# Patient Record
Sex: Female | Born: 1940 | Race: White | Hispanic: No | Marital: Married | Smoking: Never smoker
Health system: Southern US, Community
[De-identification: ages and names within clinical notes are randomized; demographics above are authoritative.]

## PROBLEM LIST (undated history)

## (undated) DIAGNOSIS — I4819 Other persistent atrial fibrillation: Secondary | ICD-10-CM

## (undated) DIAGNOSIS — E039 Hypothyroidism, unspecified: Secondary | ICD-10-CM

## (undated) DIAGNOSIS — N289 Disorder of kidney and ureter, unspecified: Secondary | ICD-10-CM

## (undated) DIAGNOSIS — I779 Disorder of arteries and arterioles, unspecified: Secondary | ICD-10-CM

## (undated) DIAGNOSIS — K559 Vascular disorder of intestine, unspecified: Secondary | ICD-10-CM

## (undated) DIAGNOSIS — J9601 Acute respiratory failure with hypoxia: Secondary | ICD-10-CM

## (undated) DIAGNOSIS — A419 Sepsis, unspecified organism: Secondary | ICD-10-CM

## (undated) DIAGNOSIS — J9621 Acute and chronic respiratory failure with hypoxia: Secondary | ICD-10-CM

## (undated) DIAGNOSIS — Z950 Presence of cardiac pacemaker: Secondary | ICD-10-CM

## (undated) DIAGNOSIS — D649 Anemia, unspecified: Secondary | ICD-10-CM

## (undated) DIAGNOSIS — I5022 Chronic systolic (congestive) heart failure: Secondary | ICD-10-CM

## (undated) DIAGNOSIS — E785 Hyperlipidemia, unspecified: Secondary | ICD-10-CM

## (undated) DIAGNOSIS — N183 Chronic kidney disease, stage 3 unspecified: Secondary | ICD-10-CM

## (undated) DIAGNOSIS — R011 Cardiac murmur, unspecified: Secondary | ICD-10-CM

## (undated) DIAGNOSIS — K922 Gastrointestinal hemorrhage, unspecified: Secondary | ICD-10-CM

## (undated) DIAGNOSIS — T8859XA Other complications of anesthesia, initial encounter: Secondary | ICD-10-CM

## (undated) DIAGNOSIS — T4145XA Adverse effect of unspecified anesthetic, initial encounter: Secondary | ICD-10-CM

## (undated) DIAGNOSIS — I482 Chronic atrial fibrillation, unspecified: Secondary | ICD-10-CM

## (undated) DIAGNOSIS — M1990 Unspecified osteoarthritis, unspecified site: Secondary | ICD-10-CM

## (undated) DIAGNOSIS — I10 Essential (primary) hypertension: Secondary | ICD-10-CM

## (undated) DIAGNOSIS — I739 Peripheral vascular disease, unspecified: Secondary | ICD-10-CM

## (undated) DIAGNOSIS — Z9889 Other specified postprocedural states: Secondary | ICD-10-CM

## (undated) DIAGNOSIS — I2510 Atherosclerotic heart disease of native coronary artery without angina pectoris: Secondary | ICD-10-CM

## (undated) DIAGNOSIS — R112 Nausea with vomiting, unspecified: Secondary | ICD-10-CM

## (undated) HISTORY — DX: Gastrointestinal hemorrhage, unspecified: K92.2

## (undated) HISTORY — PX: BACK SURGERY: SHX140

## (undated) HISTORY — DX: Chronic kidney disease, stage 3 unspecified: N18.30

## (undated) HISTORY — PX: TOTAL KNEE ARTHROPLASTY: SHX125

## (undated) HISTORY — PX: CARPAL TUNNEL RELEASE: SHX101

## (undated) HISTORY — DX: Chronic kidney disease, stage 3 (moderate): N18.3

## (undated) HISTORY — DX: Vascular disorder of intestine, unspecified: K55.9

## (undated) HISTORY — PX: LUMBAR LAMINECTOMY: SHX95

## (undated) HISTORY — DX: Essential (primary) hypertension: I10

## (undated) HISTORY — DX: Peripheral vascular disease, unspecified: I73.9

## (undated) HISTORY — DX: Anemia, unspecified: D64.9

## (undated) HISTORY — PX: JOINT REPLACEMENT: SHX530

## (undated) HISTORY — PX: APPENDECTOMY: SHX54

## (undated) HISTORY — DX: Disorder of arteries and arterioles, unspecified: I77.9

## (undated) HISTORY — DX: Hyperlipidemia, unspecified: E78.5

## (undated) HISTORY — PX: ABDOMINAL HYSTERECTOMY: SHX81

---

## 1998-01-16 ENCOUNTER — Encounter: Payer: Self-pay | Admitting: Obstetrics and Gynecology

## 1998-01-16 ENCOUNTER — Ambulatory Visit (HOSPITAL_COMMUNITY): Admission: RE | Admit: 1998-01-16 | Discharge: 1998-01-16 | Payer: Self-pay | Admitting: Obstetrics and Gynecology

## 2002-06-01 ENCOUNTER — Ambulatory Visit (HOSPITAL_COMMUNITY): Admission: RE | Admit: 2002-06-01 | Discharge: 2002-06-01 | Payer: Self-pay | Admitting: Nephrology

## 2002-06-01 ENCOUNTER — Encounter: Payer: Self-pay | Admitting: Nephrology

## 2003-08-15 ENCOUNTER — Ambulatory Visit (HOSPITAL_COMMUNITY): Admission: RE | Admit: 2003-08-15 | Discharge: 2003-08-15 | Payer: Self-pay | Admitting: Gastroenterology

## 2004-03-07 ENCOUNTER — Ambulatory Visit: Payer: Self-pay | Admitting: Internal Medicine

## 2004-04-13 ENCOUNTER — Ambulatory Visit: Payer: Self-pay | Admitting: Internal Medicine

## 2004-04-15 HISTORY — PX: FOOT SURGERY: SHX648

## 2004-04-15 HISTORY — PX: OSTEOTOMY: SHX137

## 2004-04-18 ENCOUNTER — Ambulatory Visit (HOSPITAL_COMMUNITY): Admission: RE | Admit: 2004-04-18 | Discharge: 2004-04-18 | Payer: Self-pay | Admitting: Orthopedic Surgery

## 2004-04-18 ENCOUNTER — Ambulatory Visit (HOSPITAL_BASED_OUTPATIENT_CLINIC_OR_DEPARTMENT_OTHER): Admission: RE | Admit: 2004-04-18 | Discharge: 2004-04-18 | Payer: Self-pay | Admitting: Orthopedic Surgery

## 2004-06-11 ENCOUNTER — Ambulatory Visit: Payer: Self-pay | Admitting: Internal Medicine

## 2005-04-13 ENCOUNTER — Emergency Department (HOSPITAL_COMMUNITY): Admission: EM | Admit: 2005-04-13 | Discharge: 2005-04-13 | Payer: Self-pay | Admitting: Family Medicine

## 2005-05-02 ENCOUNTER — Ambulatory Visit: Payer: Self-pay | Admitting: Internal Medicine

## 2005-06-03 ENCOUNTER — Ambulatory Visit: Payer: Self-pay | Admitting: Internal Medicine

## 2006-04-21 ENCOUNTER — Ambulatory Visit: Payer: Self-pay | Admitting: Internal Medicine

## 2006-04-21 LAB — CONVERTED CEMR LAB
ALT: 12 units/L (ref 0–40)
AST: 19 units/L (ref 0–37)
Albumin: 3.7 g/dL (ref 3.5–5.2)
Alkaline Phosphatase: 75 units/L (ref 39–117)
BUN: 13 mg/dL (ref 6–23)
Basophils Absolute: 0 10*3/uL (ref 0.0–0.1)
Basophils Relative: 0.3 % (ref 0.0–1.0)
CO2: 29 meq/L (ref 19–32)
Calcium: 9.6 mg/dL (ref 8.4–10.5)
Chloride: 101 meq/L (ref 96–112)
Chol/HDL Ratio, serum: 2.8
Cholesterol: 148 mg/dL (ref 0–200)
Creatinine, Ser: 1 mg/dL (ref 0.4–1.2)
Creatinine,U: 149.4 mg/dL
Eosinophil percent: 5.4 % — ABNORMAL HIGH (ref 0.0–5.0)
GFR calc non Af Amer: 59 mL/min
Glomerular Filtration Rate, Af Am: 71 mL/min/{1.73_m2}
Glucose, Bld: 120 mg/dL — ABNORMAL HIGH (ref 70–99)
HCT: 36.3 % (ref 36.0–46.0)
HDL: 53.3 mg/dL (ref >39.0)
Hemoglobin: 12.2 g/dL (ref 12.0–15.0)
Hgb A1c MFr Bld: 7 % — ABNORMAL HIGH (ref 4.6–6.0)
LDL Cholesterol: 73 mg/dL (ref 0–99)
Lymphocytes Relative: 15.1 % (ref 12.0–46.0)
MCHC: 33.6 g/dL (ref 30.0–36.0)
MCV: 101.6 fL — ABNORMAL HIGH (ref 78.0–100.0)
Microalb Creat Ratio: 16.1 mg/g (ref 0.0–30.0)
Microalb, Ur: 2.4 mg/dL — ABNORMAL HIGH (ref 0.0–1.9)
Monocytes Absolute: 0.5 10*3/uL (ref 0.2–0.7)
Monocytes Relative: 10 % (ref 3.0–11.0)
Neutro Abs: 3.7 10*3/uL (ref 1.4–7.7)
Neutrophils Relative %: 69.2 % (ref 43.0–77.0)
Platelets: 324 10*3/uL (ref 150–400)
Potassium: 4.1 meq/L (ref 3.5–5.1)
RBC: 3.57 M/uL — ABNORMAL LOW (ref 3.87–5.11)
RDW: 12.4 % (ref 11.5–14.6)
Sodium: 137 meq/L (ref 135–145)
TSH: 3.91 microintl units/mL (ref 0.35–5.50)
Total Bilirubin: 1.1 mg/dL (ref 0.3–1.2)
Total CK: 72 units/L (ref 7–177)
Total Protein: 7.6 g/dL (ref 6.0–8.3)
Triglyceride fasting, serum: 108 mg/dL (ref 0–149)
VLDL: 22 mg/dL (ref 0–40)
WBC: 5.3 10*3/uL (ref 4.5–10.5)

## 2006-06-30 ENCOUNTER — Ambulatory Visit: Payer: Self-pay | Admitting: Family Medicine

## 2007-02-10 ENCOUNTER — Ambulatory Visit: Payer: Self-pay | Admitting: Internal Medicine

## 2007-02-10 LAB — CONVERTED CEMR LAB: Hgb A1c MFr Bld: 6.8 % — ABNORMAL HIGH (ref 4.6–6.0)

## 2007-02-11 DIAGNOSIS — M8580 Other specified disorders of bone density and structure, unspecified site: Secondary | ICD-10-CM

## 2007-02-16 ENCOUNTER — Ambulatory Visit: Payer: Self-pay | Admitting: Internal Medicine

## 2007-02-25 ENCOUNTER — Telehealth (INDEPENDENT_AMBULATORY_CARE_PROVIDER_SITE_OTHER): Payer: Self-pay | Admitting: *Deleted

## 2007-03-03 ENCOUNTER — Encounter: Payer: Self-pay | Admitting: Internal Medicine

## 2007-05-12 ENCOUNTER — Encounter: Payer: Self-pay | Admitting: Internal Medicine

## 2007-05-25 ENCOUNTER — Telehealth (INDEPENDENT_AMBULATORY_CARE_PROVIDER_SITE_OTHER): Payer: Self-pay | Admitting: *Deleted

## 2007-05-25 ENCOUNTER — Ambulatory Visit: Payer: Self-pay | Admitting: Internal Medicine

## 2007-05-25 DIAGNOSIS — H35349 Macular cyst, hole, or pseudohole, unspecified eye: Secondary | ICD-10-CM

## 2007-05-25 DIAGNOSIS — Z9849 Cataract extraction status, unspecified eye: Secondary | ICD-10-CM

## 2007-05-25 HISTORY — PX: CATARACT EXTRACTION: SUR2

## 2007-05-26 ENCOUNTER — Encounter: Payer: Self-pay | Admitting: Internal Medicine

## 2007-06-03 ENCOUNTER — Ambulatory Visit (HOSPITAL_COMMUNITY): Admission: RE | Admit: 2007-06-03 | Discharge: 2007-06-03 | Payer: Self-pay | Admitting: Ophthalmology

## 2007-06-16 ENCOUNTER — Ambulatory Visit: Payer: Self-pay | Admitting: Internal Medicine

## 2007-06-16 LAB — CONVERTED CEMR LAB
ALT: 14 units/L (ref 0–35)
AST: 19 units/L (ref 0–37)
BUN: 26 mg/dL — ABNORMAL HIGH (ref 6–23)
Cholesterol: 152 mg/dL (ref 0–200)
Creatinine, Ser: 1 mg/dL (ref 0.4–1.2)
HDL: 44.7 mg/dL (ref >39.0)
Hgb A1c MFr Bld: 7.1 % — ABNORMAL HIGH (ref 4.6–6.0)
LDL Cholesterol: 89 mg/dL (ref 0–99)
Potassium: 5.1 meq/L (ref 3.5–5.1)
Total CHOL/HDL Ratio: 3.4
Triglycerides: 90 mg/dL (ref 0–149)
VLDL: 18 mg/dL (ref 0–40)

## 2007-06-23 ENCOUNTER — Ambulatory Visit: Payer: Self-pay | Admitting: Internal Medicine

## 2007-06-23 DIAGNOSIS — E782 Mixed hyperlipidemia: Secondary | ICD-10-CM | POA: Insufficient documentation

## 2007-06-23 LAB — CONVERTED CEMR LAB
Cholesterol, target level: 200 mg/dL
HDL goal, serum: 40 mg/dL
LDL Goal: 100 mg/dL

## 2007-08-24 ENCOUNTER — Ambulatory Visit: Payer: Self-pay | Admitting: Internal Medicine

## 2007-08-29 LAB — CONVERTED CEMR LAB
BUN: 24 mg/dL — ABNORMAL HIGH (ref 6–23)
Creatinine, Ser: 0.9 mg/dL (ref 0.4–1.2)

## 2007-08-31 ENCOUNTER — Ambulatory Visit: Payer: Self-pay | Admitting: Internal Medicine

## 2007-08-31 ENCOUNTER — Encounter (INDEPENDENT_AMBULATORY_CARE_PROVIDER_SITE_OTHER): Payer: Self-pay | Admitting: *Deleted

## 2007-12-03 ENCOUNTER — Ambulatory Visit: Payer: Self-pay | Admitting: Internal Medicine

## 2007-12-07 LAB — CONVERTED CEMR LAB
Microalb Creat Ratio: 10.3 mg/g (ref 0.0–30.0)
Microalb, Ur: 0.6 mg/dL (ref 0.0–1.9)

## 2007-12-08 ENCOUNTER — Ambulatory Visit: Payer: Self-pay | Admitting: Internal Medicine

## 2007-12-09 ENCOUNTER — Telehealth (INDEPENDENT_AMBULATORY_CARE_PROVIDER_SITE_OTHER): Payer: Self-pay | Admitting: *Deleted

## 2007-12-15 HISTORY — PX: GANGLION CYST EXCISION: SHX1691

## 2007-12-17 ENCOUNTER — Telehealth (INDEPENDENT_AMBULATORY_CARE_PROVIDER_SITE_OTHER): Payer: Self-pay | Admitting: *Deleted

## 2007-12-18 ENCOUNTER — Telehealth (INDEPENDENT_AMBULATORY_CARE_PROVIDER_SITE_OTHER): Payer: Self-pay | Admitting: *Deleted

## 2007-12-23 ENCOUNTER — Encounter: Payer: Self-pay | Admitting: Internal Medicine

## 2008-01-01 ENCOUNTER — Telehealth (INDEPENDENT_AMBULATORY_CARE_PROVIDER_SITE_OTHER): Payer: Self-pay | Admitting: *Deleted

## 2008-02-11 ENCOUNTER — Encounter: Payer: Self-pay | Admitting: Internal Medicine

## 2008-03-14 ENCOUNTER — Ambulatory Visit: Payer: Self-pay | Admitting: Internal Medicine

## 2008-03-20 LAB — CONVERTED CEMR LAB
AST: 15 units/L (ref 0–37)
Alkaline Phosphatase: 68 units/L (ref 39–117)
Hgb A1c MFr Bld: 6.6 % — ABNORMAL HIGH (ref 4.6–6.0)
Total Bilirubin: 0.8 mg/dL (ref 0.3–1.2)
Total CHOL/HDL Ratio: 2.7

## 2008-03-21 ENCOUNTER — Encounter (INDEPENDENT_AMBULATORY_CARE_PROVIDER_SITE_OTHER): Payer: Self-pay | Admitting: *Deleted

## 2008-03-22 ENCOUNTER — Ambulatory Visit: Payer: Self-pay | Admitting: Internal Medicine

## 2008-03-24 ENCOUNTER — Telehealth (INDEPENDENT_AMBULATORY_CARE_PROVIDER_SITE_OTHER): Payer: Self-pay | Admitting: *Deleted

## 2008-03-28 ENCOUNTER — Encounter: Payer: Self-pay | Admitting: Internal Medicine

## 2008-04-04 ENCOUNTER — Telehealth (INDEPENDENT_AMBULATORY_CARE_PROVIDER_SITE_OTHER): Payer: Self-pay | Admitting: *Deleted

## 2008-04-15 HISTORY — PX: PARS PLANA VITRECTOMY W/ REPAIR OF MACULAR HOLE: SHX2170

## 2008-04-20 ENCOUNTER — Telehealth (INDEPENDENT_AMBULATORY_CARE_PROVIDER_SITE_OTHER): Payer: Self-pay | Admitting: *Deleted

## 2008-04-20 ENCOUNTER — Encounter: Payer: Self-pay | Admitting: Internal Medicine

## 2008-04-29 ENCOUNTER — Telehealth (INDEPENDENT_AMBULATORY_CARE_PROVIDER_SITE_OTHER): Payer: Self-pay | Admitting: *Deleted

## 2008-05-02 ENCOUNTER — Inpatient Hospital Stay (HOSPITAL_COMMUNITY): Admission: RE | Admit: 2008-05-02 | Discharge: 2008-05-05 | Payer: Self-pay | Admitting: Orthopedic Surgery

## 2008-05-04 ENCOUNTER — Telehealth (INDEPENDENT_AMBULATORY_CARE_PROVIDER_SITE_OTHER): Payer: Self-pay | Admitting: *Deleted

## 2008-06-14 ENCOUNTER — Encounter: Payer: Self-pay | Admitting: Internal Medicine

## 2008-06-23 ENCOUNTER — Telehealth (INDEPENDENT_AMBULATORY_CARE_PROVIDER_SITE_OTHER): Payer: Self-pay | Admitting: *Deleted

## 2008-07-01 ENCOUNTER — Encounter: Payer: Self-pay | Admitting: Internal Medicine

## 2008-07-06 ENCOUNTER — Encounter (INDEPENDENT_AMBULATORY_CARE_PROVIDER_SITE_OTHER): Payer: Self-pay | Admitting: *Deleted

## 2008-07-12 ENCOUNTER — Ambulatory Visit: Payer: Self-pay | Admitting: Internal Medicine

## 2008-07-14 ENCOUNTER — Encounter: Payer: Self-pay | Admitting: Internal Medicine

## 2008-07-25 ENCOUNTER — Inpatient Hospital Stay (HOSPITAL_COMMUNITY): Admission: RE | Admit: 2008-07-25 | Discharge: 2008-07-28 | Payer: Self-pay | Admitting: Orthopedic Surgery

## 2008-08-08 ENCOUNTER — Ambulatory Visit: Payer: Self-pay | Admitting: Internal Medicine

## 2008-08-10 LAB — CONVERTED CEMR LAB
Creatinine,U: 198.5 mg/dL
Microalb Creat Ratio: 3 mg/g (ref 0.0–30.0)

## 2008-08-11 ENCOUNTER — Encounter (INDEPENDENT_AMBULATORY_CARE_PROVIDER_SITE_OTHER): Payer: Self-pay | Admitting: *Deleted

## 2008-08-15 ENCOUNTER — Ambulatory Visit: Payer: Self-pay | Admitting: Internal Medicine

## 2008-09-19 ENCOUNTER — Telehealth (INDEPENDENT_AMBULATORY_CARE_PROVIDER_SITE_OTHER): Payer: Self-pay | Admitting: *Deleted

## 2008-10-14 ENCOUNTER — Encounter: Payer: Self-pay | Admitting: Internal Medicine

## 2008-10-18 LAB — HM PAP SMEAR: HM Pap smear: NORMAL

## 2008-10-20 ENCOUNTER — Ambulatory Visit: Payer: Self-pay | Admitting: Surgery

## 2008-10-31 ENCOUNTER — Encounter: Payer: Self-pay | Admitting: Internal Medicine

## 2008-12-20 ENCOUNTER — Ambulatory Visit: Payer: Self-pay | Admitting: Internal Medicine

## 2008-12-25 LAB — CONVERTED CEMR LAB
AST: 17 units/L (ref 0–37)
Albumin: 3.9 g/dL (ref 3.5–5.2)
Cholesterol: 116 mg/dL (ref 0–200)
Creatinine, Ser: 0.9 mg/dL (ref 0.4–1.2)
Microalb Creat Ratio: 30.8 mg/g — ABNORMAL HIGH (ref 0.0–30.0)
Microalb, Ur: 1.4 mg/dL (ref 0.0–1.9)
Potassium: 5.5 meq/L — ABNORMAL HIGH (ref 3.5–5.1)
VLDL: 14 mg/dL (ref 0.0–40.0)

## 2008-12-26 ENCOUNTER — Ambulatory Visit: Payer: Self-pay | Admitting: Internal Medicine

## 2009-01-03 ENCOUNTER — Encounter: Payer: Self-pay | Admitting: Internal Medicine

## 2009-01-31 ENCOUNTER — Encounter: Payer: Self-pay | Admitting: Internal Medicine

## 2009-01-31 HISTORY — PX: COLONOSCOPY: SHX174

## 2009-02-09 ENCOUNTER — Encounter: Payer: Self-pay | Admitting: Internal Medicine

## 2009-02-15 ENCOUNTER — Encounter: Payer: Self-pay | Admitting: Internal Medicine

## 2009-02-20 ENCOUNTER — Telehealth (INDEPENDENT_AMBULATORY_CARE_PROVIDER_SITE_OTHER): Payer: Self-pay | Admitting: *Deleted

## 2009-03-01 ENCOUNTER — Encounter: Payer: Self-pay | Admitting: Internal Medicine

## 2009-03-14 ENCOUNTER — Telehealth (INDEPENDENT_AMBULATORY_CARE_PROVIDER_SITE_OTHER): Payer: Self-pay | Admitting: *Deleted

## 2009-03-15 ENCOUNTER — Encounter: Payer: Self-pay | Admitting: Internal Medicine

## 2009-05-09 ENCOUNTER — Telehealth (INDEPENDENT_AMBULATORY_CARE_PROVIDER_SITE_OTHER): Payer: Self-pay | Admitting: *Deleted

## 2009-06-21 ENCOUNTER — Encounter: Payer: Self-pay | Admitting: Internal Medicine

## 2009-06-26 ENCOUNTER — Ambulatory Visit: Payer: Self-pay | Admitting: Internal Medicine

## 2009-07-03 ENCOUNTER — Ambulatory Visit: Payer: Self-pay | Admitting: Internal Medicine

## 2009-07-03 LAB — CONVERTED CEMR LAB
BUN: 19 mg/dL (ref 6–23)
Creatinine, Ser: 1 mg/dL (ref 0.4–1.2)
Creatinine,U: 87.5 mg/dL
Hgb A1c MFr Bld: 7.2 % — ABNORMAL HIGH (ref 4.6–6.5)
Microalb Creat Ratio: 78.9 mg/g — ABNORMAL HIGH (ref 0.0–30.0)
Microalb, Ur: 6.9 mg/dL — ABNORMAL HIGH (ref 0.0–1.9)
Total CHOL/HDL Ratio: 3
Triglycerides: 93 mg/dL (ref 0.0–149.0)

## 2009-07-05 ENCOUNTER — Encounter: Payer: Self-pay | Admitting: Internal Medicine

## 2009-08-07 ENCOUNTER — Telehealth (INDEPENDENT_AMBULATORY_CARE_PROVIDER_SITE_OTHER): Payer: Self-pay | Admitting: *Deleted

## 2009-09-01 ENCOUNTER — Telehealth (INDEPENDENT_AMBULATORY_CARE_PROVIDER_SITE_OTHER): Payer: Self-pay | Admitting: *Deleted

## 2009-09-01 ENCOUNTER — Encounter: Payer: Self-pay | Admitting: Internal Medicine

## 2009-09-18 ENCOUNTER — Ambulatory Visit: Payer: Self-pay | Admitting: Internal Medicine

## 2009-09-28 ENCOUNTER — Encounter: Payer: Self-pay | Admitting: Internal Medicine

## 2009-10-02 ENCOUNTER — Ambulatory Visit: Payer: Self-pay | Admitting: Internal Medicine

## 2009-10-18 ENCOUNTER — Telehealth (INDEPENDENT_AMBULATORY_CARE_PROVIDER_SITE_OTHER): Payer: Self-pay | Admitting: *Deleted

## 2009-11-01 ENCOUNTER — Encounter: Payer: Self-pay | Admitting: Internal Medicine

## 2009-11-20 ENCOUNTER — Encounter: Payer: Self-pay | Admitting: Internal Medicine

## 2010-01-01 ENCOUNTER — Ambulatory Visit: Payer: Self-pay | Admitting: Cardiovascular Disease

## 2010-01-01 ENCOUNTER — Encounter: Payer: Self-pay | Admitting: Internal Medicine

## 2010-01-19 ENCOUNTER — Encounter: Payer: Self-pay | Admitting: Internal Medicine

## 2010-01-29 ENCOUNTER — Encounter: Payer: Self-pay | Admitting: Internal Medicine

## 2010-01-29 ENCOUNTER — Ambulatory Visit: Payer: Self-pay | Admitting: Cardiology

## 2010-02-01 ENCOUNTER — Ambulatory Visit: Payer: Self-pay | Admitting: Internal Medicine

## 2010-02-05 LAB — CONVERTED CEMR LAB
Creatinine,U: 47 mg/dL
Microalb Creat Ratio: 40.2 mg/g — ABNORMAL HIGH (ref 0.0–30.0)
Microalb, Ur: 18.9 mg/dL — ABNORMAL HIGH (ref 0.0–1.9)
Vit D, 25-Hydroxy: 64 ng/mL (ref 30–89)

## 2010-02-08 ENCOUNTER — Ambulatory Visit: Payer: Self-pay | Admitting: Internal Medicine

## 2010-03-01 ENCOUNTER — Encounter: Payer: Self-pay | Admitting: Internal Medicine

## 2010-04-17 ENCOUNTER — Telehealth (INDEPENDENT_AMBULATORY_CARE_PROVIDER_SITE_OTHER): Payer: Self-pay | Admitting: *Deleted

## 2010-05-09 ENCOUNTER — Telehealth: Payer: Self-pay | Admitting: Internal Medicine

## 2010-05-11 ENCOUNTER — Encounter: Payer: Self-pay | Admitting: Internal Medicine

## 2010-05-15 ENCOUNTER — Encounter: Payer: Self-pay | Admitting: Internal Medicine

## 2010-05-15 NOTE — Assessment & Plan Note (Signed)
Summary: follow-up   Vital Signs:  Patient profile:   70 year old female Weight:      178.8 pounds Pulse rate:   60 / minute Resp:     17 per minute BP sitting:   126 / 80  (left arm) Cuff size:   large  Vitals Entered By: Shonna Chock CMA (February 08, 2010 8:47 AM) CC: 4 month follow-up, copy of labs given. Patient would also like to know if she should continue Bystolic, Type 2 diabetes mellitus follow-up   CC:  4 month follow-up, copy of labs given. Patient would also like to know if she should continue Bystolic, and Type 2 diabetes mellitus follow-up.  History of Present Illness: Type 2 Diabetes Mellitus Follow-Up      This is a 70 year old woman who presents for Type 2 diabetes mellitus follow-up.  The patient reports weight loss of "few #", but denies polyuria, polydipsia, blurred vision, self managed hypoglycemia, and numbness of extremities.  The patient denies the following symptoms: neuropathic pain, chest pain, orthostatic symptoms, poor wound healing, intermittent claudication, vision loss, and foot ulcer.  The patient has been measuring capillary blood glucose before breakfast(94-114) and 2 hrs after dinner(112-158).  Since the last visit, the patient reports having had eye care by an Ophthalmologist; no retinopathy.  A1c 7.2%(average sugar 160, risk 44%). Note: diet poor due to travel this Summer  Allergies: 1)  ! Morphine 2)  ! Vicodin  Physical Exam  General:  well-nourished;alert,appropriate and cooperative throughout examination Lungs:  Normal respiratory effort, chest expands symmetrically. Lungs are clear to auscultation, no crackles or wheezes. Heart:  regular rhythm, no gallop, no rub, no JVD, and bradycardia.  S4 with slurring Pulses:  R and L carotid,radial,dorsalis pedis and posterior tibial pulses are full and equal bilaterally Extremities:  No clubbing, cyanosis, edema. Good nail health Neurologic:  alert & oriented X3 and sensation intact to light  touch over feet.   Skin:  Intact without suspicious lesions or rashes   Impression & Recommendations:  Problem # 1:  DIABETES MELLITUS, UNCONTROLLED (ICD-250.02) A1c 7.2% with microalbuminuria The following medications were removed from the medication list:    Metformin Hcl 500 Mg Tb24 (Metformin hcl) .Marland Kitchen... Take 2 tablets  by mouth two times a day with meal    Januvia 100 Mg Tabs (Sitagliptin phosphate) .Marland Kitchen... 1 qd Her updated medication list for this problem includes:    Altace 10 Mg Tabs (Ramipril) .Marland Kitchen... Take 1 tablet by mouth two times a day    Janumet 50-1000 Mg Tabs (Sitagliptin-metformin hcl) .Marland Kitchen... 1 two times a day with 2 largest meals  Complete Medication List: 1)  Zocor 20 Mg Tabs (Simvastatin) .... Take 1 tablet by mouth everyday 2)  Multivitamins Tabs (Multiple vitamin) .... Take 1 tablet by mouth daily 3)  Altace 10 Mg Tabs (Ramipril) .... Take 1 tablet by mouth two times a day 4)  Actonel 35 Mg Tabs (Risedronate sodium) .Marland Kitchen.. 1 by mouth q week 5)  Norvasc 5 Mg Tabs (Amlodipine besylate) .... Take 1 tablet by mouth every day 6)  Bayer Contour Test Strp (Glucose blood) .... Check bs: 1 once daily 7)  Bayer Contour Monitor W/device Kit (Blood glucose monitoring suppl) 8)  Lancets Misc (Lancets) .... Check bs: once daily 9)  Bystolic 10 Mg Tabs (Nebivolol hcl) .... 1/2-1 by mouth once daily 10)  Vitamin D 1000 Unit Tabs (Cholecalciferol) .Marland Kitchen.. 1 once daily 11)  Janumet 50-1000 Mg Tabs (Sitagliptin-metformin hcl) .Marland KitchenMarland KitchenMarland Kitchen  1 two times a day with 2 largest meals  Other Orders: Pneumococcal Vaccine (27253) Admin 1st Vaccine (66440)  Patient Instructions: 1)  Consume < 30 grams of HFCS sugar/ day as discussed. 2)  Please schedule a follow-up appointment in 4 months. 3)  HbgA1C prior to visit, ICD-9:250.02 4)  Urine Microalbumin prior to visit, ICD-9:250.02 Prescriptions: JANUMET 50-1000 MG TABS (SITAGLIPTIN-METFORMIN HCL) 1 two times a day with 2 largest meals  #180 x 1   Entered  and Authorized by:   Marga Melnick MD   Signed by:   Marga Melnick MD on 02/08/2010   Method used:   Print then Give to Patient   RxID:   [id number]    Orders Added: 1)  Pneumococcal Vaccine [90732] 2)  Admin 1st Vaccine [90471] 3)  Est. Patient Level III [32951]   Immunizations Administered:  Pneumonia Vaccine:    Vaccine Type: Pneumovax (Medicare)    Site: left deltoid    Mfr: Merck    Dose: 0.5 ml    Route: IM    Given by: Shonna Chock CMA    Exp. Date: 07/02/2011    Lot #: [id number]   Immunizations Administered:  Pneumonia Vaccine:    Vaccine Type: Pneumovax (Medicare)    Site: left deltoid    Mfr: Merck    Dose: 0.5 ml    Route: IM    Given by: Shonna Chock CMA    Exp. Date: 07/02/2011    Lot #: [id number]

## 2010-05-15 NOTE — Medication Information (Signed)
Summary: [facility]  [facility]   Imported By: Lanelle Bal 09/12/2009 12:59:41  _____________________________________________________________________  External Attachment:    Type:   Image     Comment:   External Document

## 2010-05-15 NOTE — Miscellaneous (Signed)
Summary: [facility]  [facility]   Imported By: Lanelle Bal 01/30/2010 12:44:17  _____________________________________________________________________  External Attachment:    Type:   Image     Comment:   External Document

## 2010-05-15 NOTE — Medication Information (Signed)
Summary: Revised Form for [facility]  Revised Form for [facility]   Imported By: Lanelle Bal 10/23/2009 11:58:07  _____________________________________________________________________  External Attachment:    Type:   Image     Comment:   External Document

## 2010-05-15 NOTE — Letter (Signed)
Summary: [facility]   Imported By: Lanelle Bal 02/13/2010 08:45:48  _____________________________________________________________________  External Attachment:    Type:   Image     Comment:   External Document

## 2010-05-15 NOTE — Assessment & Plan Note (Signed)
Summary: 6 MONTH OV//PH   Vital Signs:  Patient profile:   70 year old female Weight:      181 pounds Pulse rate:   64 / minute Resp:     15 per minute BP sitting:   150 / 82  (left arm) Cuff size:   large  Vitals Entered By: Shonna Chock (July 03, 2009 8:03 AM) CC: 6 Month Follow-up(copy of labs given), Hypertension Management Comments REVIEWED MED LIST, PATIENT AGREED DOSE AND INSTRUCTION CORRECT    CC:  6 Month Follow-up(copy of labs given) and Hypertension Management.  History of Present Illness: Labs reviewed ; Becky Forbes had abnormal FBSs  123-137  for 6 weeks during an protracted illness. She was exposed to a friend with viral PNA; Becky Forbes did have  flu shot in Oct. For past 2 weeks FBS 74-93; 2 hrs post meal < 154. No hypoglycemia; weight up 5#.She is trying to get back on Sugar Busters. She was on Propanolol 4X/day for 3 days for PAF with P < 45. It is now taken  as needed .  Hypertension History:      She complains of chest pain, palpitations, and side effects from treatment, but denies headache, dyspnea with exertion, orthopnea, PND, peripheral edema, visual symptoms, neurologic problems, and syncope.  She notes the following problems with antihypertensive medication side effects: BP @ home 138/74-78. Chest pain when in AF.  Further comments include: See comments re Beta blocker effcts.        Positive major cardiovascular risk factors include female age 60 years old or older, diabetes, hyperlipidemia, and hypertension.  Negative major cardiovascular risk factors include negative family history for ischemic heart disease and non-tobacco-user status.        Further assessment for target organ damage reveals no history of ASHD, stroke/TIA, or peripheral vascular disease.     Allergies: 1)  ! Morphine 2)  ! Vicodin  Review of Systems General:  Complains of fatigue; denies chills, fever, and sweats; Tired since RTI X 6 weeks. Eyes:  Denies blurring, double vision, and vision  loss-both eyes; No retinopathy. ENT:  No purulence now. CV:  Complains of lightheadness and near fainting; denies leg cramps with exertion; Above only when in AF. Resp:  Complains of cough; denies shortness of breath, sputum productive, and wheezing; Minimal am cough. Derm:  Denies poor wound healing. Neuro:  Denies numbness and tingling. Endo:  Denies excessive hunger, excessive thirst, and excessive urination.  Physical Exam  General:  well-nourished; alert,appropriate and cooperative throughout examination Lungs:  Normal respiratory effort, chest expands symmetrically. Lungs are clear to auscultation, no crackles or wheezes. Heart:  Normal rate and regular rhythm. Increased S1;S2 normal without gallop, murmur, click, rub or other extra sounds. Abdomen:  Bowel sounds positive,abdomen soft and non-tender without masses, organomegaly or hernias noted. Faint aortic bruit w/o AAA Pulses:  R and L carotid,radial,dorsalis pedis and posterior tibial pulses are full and equal bilaterally Extremities:  No clubbing, cyanosis, edema, or deformity noted . Good nail health Neurologic:  alert & oriented X3 and sensation intact to light touch over feet   Skin:  Intact without suspicious lesions or rashes Psych:  memory intact for recent and remote, normally interactive, and good eye contact.     Impression & Recommendations:  Problem # 1:  DIABETES MELLITUS, UNCONTROLLED (ICD-250.02) ? loss of control due to RTI over 6 weeks Her updated medication list for this problem includes:    Altace 10 Mg Tabs (Ramipril) .Marland Kitchen... Take 1  tablet by mouth two times a day    Metformin Hcl 500 Mg Tb24 (Metformin hcl) .Marland Kitchen... Take 2 tablets  by mouth two times a day with meal    Januvia 100 Mg Tabs (Sitagliptin phosphate) .Marland Kitchen... 1 qd  Problem # 2:  HYPERTENSION (ICD-401.9)  Her updated medication list for this problem includes:    Altace 10 Mg Tabs (Ramipril) .Marland Kitchen... Take 1 tablet by mouth two times a day    Norvasc 5  Mg Tabs (Amlodipine besylate) .Marland Kitchen... Take 1 tablet by mouth every day  Problem # 3:  PAROXYSMAL ATRIAL FIBRILLATION (ICD-427.31) PMH of Her updated medication list for this problem includes:    Norvasc 5 Mg Tabs (Amlodipine besylate) .Marland Kitchen... Take 1 tablet by mouth every day  Complete Medication List: 1)  Zocor 20 Mg Tabs (Simvastatin) .... Take 1 tablet by mouth everyday 2)  Multivitamins Tabs (Multiple vitamin) .... Take 1 tablet by mouth daily 3)  Altace 10 Mg Tabs (Ramipril) .... Take 1 tablet by mouth two times a day 4)  Actonel 35 Mg Tabs (Risedronate sodium) .Marland Kitchen.. 1 by mouth q week 5)  Norvasc 5 Mg Tabs (Amlodipine besylate) .... Take 1 tablet by mouth every day 6)  Metformin Hcl 500 Mg Tb24 (Metformin hcl) .... Take 2 tablets  by mouth two times a day with meal 7)  Januvia 100 Mg Tabs (Sitagliptin phosphate) .Marland Kitchen.. 1 qd 8)  Bayer Contour Test Strp (Glucose blood) .... As directed 9)  Designer, multimedia W/device Kit (Blood glucose monitoring suppl)  Hypertension Assessment/Plan:      The patient's hypertensive risk group is category C: Target organ damage and/or diabetes.  Her calculated 10 year risk of coronary heart disease is 20 %.  Today's blood pressure is 150/82.    Patient Instructions: 1)  See your eye doctor yearly to check for diabetic eye damage. 2)  Check your feet each night for sore areas, calluses or signs of infection. 3)  Check your Blood Pressure regularly. If it is above: 140/90 ON AVERAGE you should  increase Norvasc 5 mg to  two times a day .Take cuff to all MD appt. Consume LESS THAN 25 grams / day from foods & drinks with High Fructose Corn Syrup as #1,2 or # 3 on label.Med changes will depend on A1c in 10 weeks. 4)  Please schedule a follow-up appointment in 3 months. 5)  HbgA1C prior to visit, ICD-9:250.02 6)  Urine Microalbumin prior to visit, ICD-9:

## 2010-05-15 NOTE — Letter (Signed)
Summary: [facility]   Imported By: Lennie Odor 01/19/2010 10:16:43  _____________________________________________________________________  External Attachment:    Type:   Image     Comment:   External Document

## 2010-05-15 NOTE — Assessment & Plan Note (Signed)
Summary: 3 month roa//lch   Vital Signs:  Patient profile:   70 year old female Weight:      182.0 pounds Pulse rate:   54 / minute Resp:     17 per minute BP sitting:   150 / 82  (left arm) Cuff size:   large  Vitals Entered By: Shonna Chock (October 02, 2009 9:14 AM) CC: 3 Month follow-up, Type 2 diabetes mellitus follow-up, Hypertension Management Comments REVIEWED MED LIST, PATIENT AGREED DOSE AND INSTRUCTION CORRECT    CC:  3 Month follow-up, Type 2 diabetes mellitus follow-up, and Hypertension Management.  History of Present Illness:  Type 2 Diabetes Mellitus Follow-Up      This is a 70 year old woman who presents for Type 2 diabetes mellitus follow-up.  The patient denies polyuria, polydipsia, blurred vision, self managed hypoglycem when taking Beta blocker for PAF as per Dr.Nahser. His 07/05/2009 note was reviewed.She had a pulse of 34 on Digoxin, propranolol  & Cardizem. He has stated " a pacer may be necessary " in future.She had mild pain L calf [place]; she has no intermittent claudication walking a mile 3X/week.  The patient denies the following symptoms: neuropathic pain, chest pain, vomiting, poor wound healing, vision loss, and foot ulcer.  Since the last visit the patient reports fair dietary compliance, compliance with medications, exercising regularly, and monitoring blood glucose.  The patient has been measuring capillary blood glucose before breakfast (FBS 78-199) and  2 hrs after dinner (< 199).  Since the last visit, the patient reports having had eye care by a retinal specialist and no foot care.  She had ESI X 3 in 03/2010 by Dr Ethelene Hal. Risk of Beta blocker masking  hypoglycemia discussed. A1c down from 7.2 % to 7.1%.  Hypertension History:      She complains of side effects from treatment, but denies headache, chest pain, palpitations, dyspnea with exertion, orthopnea, PND, peripheral edema, and syncope.  BP 128/60-170/ 68.See comments on Beta blocker  adverse effects.        Positive major cardiovascular risk factors include female age 71 years old or older, diabetes, hyperlipidemia, and hypertension.  Negative major cardiovascular risk factors include negative family history for ischemic heart disease and non-tobacco-user status.        Further assessment for target organ damage reveals no history of ASHD, stroke/TIA, or peripheral vascular disease.     Allergies: 1)  ! Morphine 2)  ! Vicodin  Review of Systems Eyes:  Denies double vision and vision loss-both eyes. ENT:  Denies nosebleeds.  Physical Exam  General:  well-nourished,in no acute distress; alert,appropriate and cooperative throughout examination Lungs:  Normal respiratory effort, chest expands symmetrically. Lungs are clear to auscultation, no crackles or wheezes. Heart:  regular rhythm, no gallop, no rub, no JVD, no HJR, and bradycardia.   S4 with slurring Abdomen:  Bowel sounds positive,abdomen soft and non-tender without masses, organomegaly or hernias noted. No AAAor bruits Pulses:  R and L carotid,radial,dorsalis pedis and posterior tibial pulses are full and equal bilaterally. L carotid faint bruit Extremities:  No clubbing, cyanosis, edema, or deformity noted .Good nail health Neurologic:  alert & oriented X3 and sensation intact to light touch over feet.   Skin:  Intact without suspicious lesions or rashes Psych:  memory intact for recent and remote, normally interactive, and good eye contact.     Impression & Recommendations:  Problem # 1:  DIABETES MELLITUS, UNCONTROLLED (ICD-250.02) Slight improvement despite ESI in  03/2009 Her updated medication list for this problem includes:    Altace 10 Mg Tabs (Ramipril) .Marland Kitchen... Take 1 tablet by mouth two times a day    Metformin Hcl 500 Mg Tb24 (Metformin hcl) .Marland Kitchen... Take 2 tablets  by mouth two times a day with meal    Januvia 100 Mg Tabs (Sitagliptin phosphate) .Marland Kitchen... 1 qd  Problem # 2:  HYPERTENSION (ICD-401.9) ?  control Her updated medication list for this problem includes:    Altace 10 Mg Tabs (Ramipril) .Marland Kitchen... Take 1 tablet by mouth two times a day    Norvasc 5 Mg Tabs (Amlodipine besylate) .Marland Kitchen... Take 1 tablet by mouth every day  Complete Medication List: 1)  Zocor 20 Mg Tabs (Simvastatin) .... Take 1 tablet by mouth everyday 2)  Multivitamins Tabs (Multiple vitamin) .... Take 1 tablet by mouth daily 3)  Altace 10 Mg Tabs (Ramipril) .... Take 1 tablet by mouth two times a day 4)  Actonel 35 Mg Tabs (Risedronate sodium) .Marland Kitchen.. 1 by mouth q week 5)  Norvasc 5 Mg Tabs (Amlodipine besylate) .... Take 1 tablet by mouth every day 6)  Metformin Hcl 500 Mg Tb24 (Metformin hcl) .... Take 2 tablets  by mouth two times a day with meal 7)  Januvia 100 Mg Tabs (Sitagliptin phosphate) .Marland Kitchen.. 1 qd 8)  Bayer Contour Test Strp (Glucose blood) .... Check bs: 1 once daily 9)  Bayer Contour Monitor W/device Kit (Blood glucose monitoring suppl) 10)  Lancets Misc (Lancets) .... Check bs: once daily  Hypertension Assessment/Plan:      The patient's hypertensive risk group is category C: Target organ damage and/or diabetes.  Her calculated 10 year risk of coronary heart disease is 20 %.  Today's blood pressure is 150/82.    Patient Instructions: 1)  Consume < 25 grams of "sugar"/ day from foods & drinks with High Fructose Corn Syrup as #1,2 or # 3 on label.Check your Blood Pressure regularly. If it is above: 140/90 ON AVERAGE INCREASE Amlodipine to  5 mg two times a day . Stop Actonel after 5 years. BMD every 25 months. Vitamin D level in 4 months also(733.90) 2)  Please schedule a follow-up appointment in 4 months. 3)  BUN,creat , K+ prior to visit, ICD-9:401.9 4)  HbgA1C prior to visit, ICD-9:250.02 5)  Urine Microalbumin prior to visit, ICD-9:250.02. Monitor glucoses two times a day if taking Propranolol. Prescriptions: ACTONEL 35 MG  TABS (RISEDRONATE SODIUM) 1 by mouth q week  #12 x 3   Entered and Authorized by:    Marga Melnick MD   Signed by:   Marga Melnick MD on 10/02/2009   Method used:   Print then Give to Patient   RxID:   [id number]

## 2010-05-15 NOTE — Progress Notes (Signed)
Summary: Refill Requests  Phone Note Refill Request Call back at [phone] Message from:  Pharmacy on August 07, 2009 10:47 AM  Refills Requested: Medication #1:  NORVASC 5 MG  TABS take 1 tablet by mouth every day   Dosage confirmed as above?Dosage Confirmed   Supply Requested: 3 months   Last Refilled: 05/09/2009  Medication #2:  ZOCOR 20 MG  TABS take 1 tablet by mouth everyday   Dosage confirmed as above?Dosage Confirmed   Supply Requested: 3 months   Last Refilled: 05/11/2009  Medication #3:  METFORMIN HCL 500 MG  TB24 take 2 tablets  by mouth two times a day with meal   Dosage confirmed as above?Dosage Confirmed   Supply Requested: 3 months   Last Refilled: 05/11/2009 [facility]  Next Appointment Scheduled: 6.13.11 Initial call taken by: Harold Barban,  August 07, 2009 10:47 AM  Follow-up for Phone Call        RX's were faxed to: [phone] Follow-up by: Shonna Chock,  August 07, 2009 12:17 PM    Prescriptions: METFORMIN HCL 500 MG  TB24 (METFORMIN HCL) take 2 tablets  by mouth two times a day with meal  #360 x 2   Entered by:   Shonna Chock   Authorized by:   Marga Melnick MD   Signed by:   Shonna Chock on 08/07/2009   Method used:   Print then Give to Patient   RxID:   [id number] NORVASC 5 MG  TABS (AMLODIPINE BESYLATE) take 1 tablet by mouth every day  #90 x 2   Entered by:   Shonna Chock   Authorized by:   Marga Melnick MD   Signed by:   Shonna Chock on 08/07/2009   Method used:   Print then Give to Patient   RxID:   [id number] ZOCOR 20 MG  TABS (SIMVASTATIN) take 1 tablet by mouth everyday  #90 x 2   Entered by:   Shonna Chock   Authorized by:   Marga Melnick MD   Signed by:   Shonna Chock on 08/07/2009   Method used:   Print then Give to Patient   RxID:   [id number]

## 2010-05-15 NOTE — Letter (Signed)
Summary: [facility]   Imported By: Lanelle Bal 11/27/2009 12:46:52  _____________________________________________________________________  External Attachment:    Type:   Image     Comment:   External Document

## 2010-05-15 NOTE — Medication Information (Signed)
Summary: [facility]  [facility]   Imported By: Lanelle Bal 06/27/2009 14:10:40  _____________________________________________________________________  External Attachment:    Type:   Image     Comment:   External Document

## 2010-05-15 NOTE — Progress Notes (Signed)
Summary: RX. request  Phone Note Refill Request Message from:  Fax from Pharmacy on May 09, 2009 1:53 PM  Refills Requested: Medication #1:  ZOCOR 20 MG  TABS take 1 tablet by mouth everyday   Dosage confirmed as above?Dosage Confirmed  Medication #2:  METFORMIN HCL 500 MG  TB24 take 2 tablets  by mouth two times a day with meal [facility] Rx. request from [facility]  Initial call taken by: Michaelle Copas,  May 09, 2009 1:54 PM    Prescriptions: METFORMIN HCL 500 MG  TB24 (METFORMIN HCL) take 2 tablets  by mouth two times a day with meal  #360 x 0   Entered by:   Shonna Chock   Authorized by:   Marga Melnick MD   Signed by:   Shonna Chock on 05/09/2009   Method used:   Faxed to ...       [facility] (retail)             , [place]         Ph: [phone]       Fax: [phone]   RxID:   [id number] ZOCOR 20 MG  TABS (SIMVASTATIN) take 1 tablet by mouth everyday  #90 x 0   Entered by:   Shonna Chock   Authorized by:   Marga Melnick MD   Signed by:   Shonna Chock on 05/09/2009   Method used:   Faxed to ...       [facility] (retail)             , [place]         Ph: [phone]       Fax: [phone]   RxID:   [id number]  Patient due for labs in 06/2009./Chrae Malloy  May 09, 2009 3:38 PM

## 2010-05-15 NOTE — Medication Information (Signed)
Summary: [facility]  [facility]   Imported By: Lanelle Bal 10/05/2009 10:42:38  _____________________________________________________________________  External Attachment:    Type:   Image     Comment:   External Document

## 2010-05-15 NOTE — Progress Notes (Signed)
Summary: Refill Request  Phone Note Refill Request Call back at Home Phone [phone] Message from:  Patient  Refills Requested: Medication #1:  BAYER CONTOUR TEST  STRP as directed  Medication #2:  Contour Lancets Would like it to say two a day instead of one a day   Method Requested: Fax to Local Pharmacy Initial call taken by: Shonna Chock,  Sep 01, 2009 2:58 PM  Follow-up for Phone Call        Patient is aware insurance may not cover two times a day because she is NOT on insulin. Patient ok'd and said just send it as two times a day and lets see what the insurance company will say Follow-up by: Shonna Chock,  Sep 01, 2009 3:29 PM    New/Updated Medications: BAYER CONTOUR TEST  STRP (GLUCOSE BLOOD) test two times a day LANCETS  MISC (LANCETS) Check BS two times a day Prescriptions: LANCETS  MISC (LANCETS) Check BS two times a day  #200 x 3   Entered by:   Shonna Chock   Authorized by:   Marga Melnick MD   Signed by:   Shonna Chock on 09/01/2009   Method used:   Faxed to ...       [facility] (retail)             , [place]         Ph: [phone]       Fax: [phone]   RxID:   [id number] BAYER CONTOUR TEST  STRP (GLUCOSE BLOOD) test two times a day  #200 x 3   Entered by:   Shonna Chock   Authorized by:   Marga Melnick MD   Signed by:   Shonna Chock on 09/01/2009   Method used:   Faxed to ...       [facility] (retail)             , [place]         Ph: [phone]       Fax: [phone]   RxID:   [id number]

## 2010-05-15 NOTE — Progress Notes (Signed)
Summary: REFILL REQUEST  Phone Note Refill Request Call back at [phone] Message from:  Pharmacy on October 18, 2009 8:31 AM  Refills Requested: Medication #1:  ALTACE 10 MG  TABS take 1 tablet by mouth two times a day   Dosage confirmed as above?Dosage Confirmed   Supply Requested: 3 months   Last Refilled: 08/04/2009  Medication #2:  JANUVIA 100 MG  TABS 1 qd   Dosage confirmed as above?Dosage Confirmed   Supply Requested: 3 months   Last Refilled: 08/04/2009 [facility]  Next Appointment Scheduled: February 02 2010 Initial call taken by: Lavell Islam,  October 18, 2009 8:34 AM    Prescriptions: JANUVIA 100 MG  TABS (SITAGLIPTIN PHOSPHATE) 1 qd  #90 x 1   Entered by:   Shonna Chock   Authorized by:   Marga Melnick MD   Signed by:   Shonna Chock on 10/18/2009   Method used:   Faxed to ...       [facility] (retail)             , [place]         Ph: [phone]       Fax: [phone]   RxID:   [id number] ALTACE 10 MG  TABS (RAMIPRIL) take 1 tablet by mouth two times a day  #180 x 1   Entered by:   Shonna Chock   Authorized by:   Marga Melnick MD   Signed by:   Shonna Chock on 10/18/2009   Method used:   Faxed to ...       [facility] (retail)             , [place]         Ph: [phone]       Fax: [phone]   RxID:   [id number]

## 2010-05-15 NOTE — Medication Information (Signed)
Summary: [facility]  [facility]   Imported By: Lanelle Bal 03/09/2010 15:04:25  _____________________________________________________________________  External Attachment:    Type:   Image     Comment:   External Document

## 2010-05-15 NOTE — Letter (Signed)
Summary: [facility]   Imported By: Lanelle Bal 07/15/2009 10:30:01  _____________________________________________________________________  External Attachment:    Type:   Image     Comment:   External Document

## 2010-05-17 NOTE — Progress Notes (Signed)
Summary: Shingles Shot  Phone Note Call from Patient   Caller: Spouse(James) Summary of Call: Pts spouse would like to know if Dr. Alwyn Ren thinks pt should have a shingle shot. If so need rx.  [phone] Initial call taken by: Lavell Islam,  May 09, 2010 9:55 AM  Follow-up for Phone Call        great idea Follow-up by: Marga Melnick MD,  May 09, 2010 1:04 PM  Additional Follow-up for Phone Call Additional follow up Details #1::        Patient spouse notified. Additional Follow-up by: Lucious Groves CMA,  May 09, 2010 2:59 PM    New/Updated Medications: ZOSTAVAX 32440 UNT/0.65ML SOLR (ZOSTER VACCINE LIVE) administer as directed Prescriptions: ZOSTAVAX 10272 UNT/0.65ML SOLR (ZOSTER VACCINE LIVE) administer as directed  #1 x 0   Entered by:   Lucious Groves CMA   Authorized by:   Nolon Rod. Paz MD   Signed by:   Lucious Groves CMA on 05/09/2010   Method used:   Electronically to        [facility]* (retail)       [address]       [place], [place]  [id number]       Ph: [phone]       Fax: [phone]   RxID:   [id number]

## 2010-05-17 NOTE — Progress Notes (Signed)
Summary: Refill Requests  Phone Note Refill Request Call back at [phone] Message from:  Pharmacy on April 17, 2010 2:07 PM  Refills Requested: Medication #1:  NORVASC 5 MG  TABS take 1 tablet by mouth every day   Dosage confirmed as above?Dosage Confirmed   Supply Requested: 3 months   Last Refilled: 01/01/2010  Medication #2:  ZOCOR 20 MG  TABS take 1 tablet by mouth everyday   Dosage confirmed as above?Dosage Confirmed   Supply Requested: 3 months   Last Refilled: 01/01/2010  Medication #3:  ALTACE 10 MG  TABS take 1 tablet by mouth two times a day   Dosage confirmed as above?Dosage Confirmed   Supply Requested: 3 months   Last Refilled: 01/01/2010 [facility]  Next Appointment Scheduled: 2.27.12 Initial call taken by: Harold Barban,  April 17, 2010 2:08 PM    Prescriptions: NORVASC 5 MG  TABS (AMLODIPINE BESYLATE) take 1 tablet by mouth every day  #90 x 1   Entered by:   Shonna Chock CMA   Authorized by:   Marga Melnick MD   Signed by:   Shonna Chock CMA on 04/17/2010   Method used:   Faxed to ...       [facility] (retail)             , [place]         Ph: [phone]       Fax: [phone]   RxID:   [id number] ALTACE 10 MG  TABS (RAMIPRIL) take 1 tablet by mouth two times a day  #180 x 1   Entered by:   Shonna Chock CMA   Authorized by:   Marga Melnick MD   Signed by:   Shonna Chock CMA on 04/17/2010   Method used:   Faxed to ...       [facility] (retail)             , [place]         Ph: [phone]       Fax: [phone]   RxID:   [id number] ZOCOR 20 MG  TABS (SIMVASTATIN) take 1 tablet by mouth everyday  #90 x 0   Entered by:   Shonna Chock CMA   Authorized by:   Marga Melnick MD   Signed by:   Shonna Chock CMA on 04/17/2010   Method used:   Faxed to ...       [facility] (retail)             , [place]         Ph: [phone]       Fax: [phone]   RxID:   [id number]

## 2010-05-31 NOTE — Miscellaneous (Signed)
Summary: Zostavax/[facility]   Imported By: Maryln Gottron 05/22/2010 10:43:23  _____________________________________________________________________  External Attachment:    Type:   Image     Comment:   External Document

## 2010-05-31 NOTE — Medication Information (Signed)
Summary: Order for Diabetic Testing Supplies  Order for Diabetic Testing Supplies   Imported By: Maryln Gottron 05/22/2010 15:18:50  _____________________________________________________________________  External Attachment:    Type:   Image     Comment:   External Document

## 2010-06-04 ENCOUNTER — Other Ambulatory Visit: Payer: Self-pay | Admitting: Internal Medicine

## 2010-06-04 ENCOUNTER — Other Ambulatory Visit: Payer: Medicare Other

## 2010-06-04 ENCOUNTER — Encounter (INDEPENDENT_AMBULATORY_CARE_PROVIDER_SITE_OTHER): Payer: Self-pay | Admitting: *Deleted

## 2010-06-04 DIAGNOSIS — E1165 Type 2 diabetes mellitus with hyperglycemia: Secondary | ICD-10-CM

## 2010-06-04 LAB — MICROALBUMIN / CREATININE URINE RATIO
Creatinine,U: 82.3 mg/dL
Microalb Creat Ratio: 19.8 mg/g (ref 0.0–30.0)

## 2010-06-11 ENCOUNTER — Encounter: Payer: Self-pay | Admitting: Internal Medicine

## 2010-06-11 ENCOUNTER — Ambulatory Visit (INDEPENDENT_AMBULATORY_CARE_PROVIDER_SITE_OTHER): Payer: Medicare Other | Admitting: Internal Medicine

## 2010-06-14 ENCOUNTER — Encounter: Payer: Self-pay | Admitting: Cardiovascular Disease

## 2010-06-21 NOTE — Assessment & Plan Note (Signed)
Summary: 4 MONTH FOLLOWUP///[facility]   Vital Signs:  Patient profile:   70 year old female Height:      64 inches Weight:      178.2 pounds BMI:     30.70 Pulse rate:   60 / minute Resp:     14 per minute BP sitting:   132 / 70  (left arm) Cuff size:   large  Vitals Entered By: Shonna Chock CMA (June 11, 2010 8:54 AM) CC: 4 month follow-up (patient with copy of mailed labs) , Type 2 diabetes mellitus follow-up   CC:  4 month follow-up (patient with copy of mailed labs)  and Type 2 diabetes mellitus follow-up.  History of Present Illness:    Serial labs reviewed & risks discussed. A1c up from 7.2% ( average sugar 160, long term risk 44%) to 7.9%( 180/58%). She  denies polyuria, polydipsia, blurred vision, self managed hypoglycemia, weight loss, weight gain, and numbness of extremities.  Other symptoms include orthostatic symptoms.  The patient denies the following symptoms: neuropathic pain, chest pain, vomiting, poor wound healing, intermittent claudication, vision loss, and foot ulcer.  Since the last visit the patient reports poor dietary compliance, not exercising regularly, and monitoring blood glucose.  The patient has been measuring capillary blood glucose before breakfast  (91-133) and  2 hrs after dinner ( average 148). Validity of glucometer questioned based on A1c.  Since the last visit, the patient reports having had no eye care and no foot care.  Retinology appt with Dr Luciana Axe in 07/2010. Significance of elevated microalbumin discussed in relation to eye & kidney disease long term.  Current Medications (verified): 1)  Zocor 20 Mg  Tabs (Simvastatin) .... Take 1 Tablet By Mouth Everyday 2)  Multivitamins   Tabs (Multiple Vitamin) .... Take 1 Tablet By Mouth Daily 3)  Altace 10 Mg  Tabs (Ramipril) .... Take 1 Tablet By Mouth Two Times A Day 4)  Norvasc 5 Mg  Tabs (Amlodipine Besylate) .... Take 1 Tablet By Mouth Every Day 5)  Bayer Contour Test  Strp (Glucose Blood) .... Check  Bs: 1 Once Daily 6)  Bayer Contour Monitor W/device Kit (Blood Glucose Monitoring Suppl) 7)  Lancets  Misc (Lancets) .... Check Bs: Once Daily 8)  Bystolic 10 Mg Tabs (Nebivolol Hcl) .... 1/2-1 By Mouth Once Daily 9)  Vitamin D 1000 Unit Tabs (Cholecalciferol) .Marland Kitchen.. 1 Once Daily 10)  Janumet 50-1000 Mg Tabs (Sitagliptin-Metformin Hcl) .Marland Kitchen.. 1 Two Times A Day With 2 Largest Meals 11)  Calcium 500 Mg Tabs (Calcium) .... 2 By Mouth Once Daily  Allergies: 1)  ! Morphine 2)  ! Vicodin  Physical Exam  General:  well-nourished,in no acute distress; alert,appropriate and cooperative throughout examination Lungs:  Normal respiratory effort, chest expands symmetrically. Lungs are clear to auscultation, no crackles or wheezes. Heart:  Normal rate and regular rhythm. S4 and  split S2 normal without gallop, murmur, click, rub or other extra sounds. Abdomen:  Bowel sounds positive,abdomen soft and non-tender without masses, organomegaly or hernias noted. Pulses:  R and L carotid,radial,dorsalis pedis and posterior tibial pulses are full and equal bilaterally Extremities:  No clubbing, cyanosis, edema. Good nail health Neurologic:  alert & oriented X3 and sensation intact to light touch over feet   Skin:  Intact without suspicious lesions or rashes Psych:  memory intact for recent and remote, normally interactive, and good eye contact.     Impression & Recommendations:  Problem # 1:  DIABETES MELLITUS, UNCONTROLLED (ICD-250.02)  progressive  loss of control; risks  treatment options discussed Her updated medication list for this problem includes:    Altace 10 Mg Tabs (Ramipril) .Marland Kitchen... Take 1 tablet by mouth two times a day as per Nephrologist    Janumet 50-1000 Mg Tabs (Sitagliptin-metformin hcl) .Marland Kitchen... 1 two times a day with 2 largest meals  Orders: Prescription Created Electronically [id number]  Complete Medication List: 1)  Zocor 20 Mg Tabs (Simvastatin) .... Take 1 tablet by mouth everyday 2)   Multivitamins Tabs (Multiple vitamin) .... Take 1 tablet by mouth daily 3)  Altace 10 Mg Tabs (Ramipril) .... Take 1 tablet by mouth two times a day 4)  Norvasc 5 Mg Tabs (Amlodipine besylate) .... Take 1 tablet by mouth every day 5)  Bayer Contour Test Strp (Glucose blood) .... Check bs: 1 once daily 6)  Bayer Contour Monitor W/device Kit (Blood glucose monitoring suppl) 7)  Lancets Misc (Lancets) .... Check bs: once daily 8)  Bystolic 10 Mg Tabs (Nebivolol hcl) .... 1/2-1 by mouth once daily 9)  Vitamin D 1000 Unit Tabs (Cholecalciferol) .Marland Kitchen.. 1 once daily 10)  Janumet 50-1000 Mg Tabs (Sitagliptin-metformin hcl) .Marland Kitchen.. 1 two times a day with 2 largest meals 11)  Calcium 500 Mg Tabs (Calcium) .... 2 by mouth once daily 12)  Glimepiride 1 Mg Tabs (Glimepiride) .Marland Kitchen.. 1 once daily  Patient Instructions: 1)  Please call if you do wish to see a Nutritionist.Follow a low carb program such as [employer]. Share this record with all MDs seen. Bring Glucometer to next appt to verify its accuracy. 2)  Please schedule a follow-up appointment in 3 months. 3)  BUN,creat, K+ to visit, ICD-9:401.9 4)  Lipid Panel prior to visit, ICD-9:272.4 5)  HbgA1C prior to visit, ICD-9:250.02 6)  Urine Microalbumin prior to visit, ICD-9:250.02 Prescriptions: GLIMEPIRIDE 1 MG TABS (GLIMEPIRIDE) 1 once daily  #90 x 0   Entered and Authorized by:   Marga Melnick MD   Signed by:   Marga Melnick MD on 06/11/2010   Method used:   Electronically to        [facility]  [phone]* (retail)       [address]       [place], [place]  [address]       Ph: [phone] or [phone]       Fax: [phone]   RxID:   [id number]    Orders Added: 1)  Est. Patient Level III [59563] 2)  Prescription Created Electronically [phone]

## 2010-07-12 ENCOUNTER — Ambulatory Visit: Payer: Self-pay | Admitting: Cardiovascular Disease

## 2010-07-25 LAB — BASIC METABOLIC PANEL
BUN: 9 mg/dL (ref 6–23)
CO2: 28 mEq/L (ref 19–32)
CO2: 28 mEq/L (ref 19–32)
CO2: 29 mEq/L (ref 19–32)
Calcium: 8.6 mg/dL (ref 8.4–10.5)
Calcium: 8.7 mg/dL (ref 8.4–10.5)
Calcium: 8.8 mg/dL (ref 8.4–10.5)
Calcium: 8.9 mg/dL (ref 8.4–10.5)
Chloride: 101 mEq/L (ref 96–112)
Creatinine, Ser: 0.67 mg/dL (ref 0.4–1.2)
Creatinine, Ser: 0.82 mg/dL (ref 0.4–1.2)
Creatinine, Ser: 0.96 mg/dL (ref 0.4–1.2)
GFR calc Af Amer: >60 mL/min (ref >60)
GFR calc Af Amer: >60 mL/min (ref >60)
GFR calc Af Amer: >60 mL/min (ref >60)
GFR calc Af Amer: >60 mL/min (ref >60)
GFR calc non Af Amer: >60 mL/min (ref >60)
GFR calc non Af Amer: >60 mL/min (ref >60)
Glucose, Bld: 148 mg/dL — ABNORMAL HIGH (ref 70–99)
Potassium: 5.1 mEq/L (ref 3.5–5.1)
Sodium: 133 mEq/L — ABNORMAL LOW (ref 135–145)
Sodium: 133 mEq/L — ABNORMAL LOW (ref 135–145)
Sodium: 136 mEq/L (ref 135–145)

## 2010-07-25 LAB — GLUCOSE, CAPILLARY
Glucose-Capillary: 104 mg/dL — ABNORMAL HIGH (ref 70–99)
Glucose-Capillary: 106 mg/dL — ABNORMAL HIGH (ref 70–99)
Glucose-Capillary: 115 mg/dL — ABNORMAL HIGH (ref 70–99)
Glucose-Capillary: 130 mg/dL — ABNORMAL HIGH (ref 70–99)
Glucose-Capillary: 136 mg/dL — ABNORMAL HIGH (ref 70–99)
Glucose-Capillary: 158 mg/dL — ABNORMAL HIGH (ref 70–99)
Glucose-Capillary: 213 mg/dL — ABNORMAL HIGH (ref 70–99)
Glucose-Capillary: 225 mg/dL — ABNORMAL HIGH (ref 70–99)
Glucose-Capillary: 50 mg/dL — ABNORMAL LOW (ref 70–99)
Glucose-Capillary: 92 mg/dL (ref 70–99)

## 2010-07-25 LAB — CBC
HCT: 26.4 % — ABNORMAL LOW (ref 36.0–46.0)
Hemoglobin: 8.2 g/dL — ABNORMAL LOW (ref 12.0–15.0)
Hemoglobin: 8.9 g/dL — ABNORMAL LOW (ref 12.0–15.0)
MCHC: 34.1 g/dL (ref 30.0–36.0)
MCHC: 34.3 g/dL (ref 30.0–36.0)
MCV: 95.4 fL (ref 78.0–100.0)
Platelets: 288 10*3/uL (ref 150–400)
RBC: 2.43 MIL/uL — ABNORMAL LOW (ref 3.87–5.11)
RBC: 2.51 MIL/uL — ABNORMAL LOW (ref 3.87–5.11)
RDW: 13.9 % (ref 11.5–15.5)
WBC: 11.3 10*3/uL — ABNORMAL HIGH (ref 4.0–10.5)
WBC: 7.3 10*3/uL (ref 4.0–10.5)

## 2010-07-25 LAB — PROTIME-INR
INR: 1.1 (ref 0.00–1.49)
INR: 1.4 (ref 0.00–1.49)
INR: 2 — ABNORMAL HIGH (ref 0.00–1.49)
Prothrombin Time: 17.6 seconds — ABNORMAL HIGH (ref 11.6–15.2)
Prothrombin Time: 24.2 seconds — ABNORMAL HIGH (ref 11.6–15.2)

## 2010-07-25 LAB — TYPE AND SCREEN
ABO/RH(D): A POS
Antibody Screen: NEGATIVE

## 2010-07-26 ENCOUNTER — Ambulatory Visit: Payer: Self-pay | Admitting: Cardiovascular Disease

## 2010-07-26 LAB — COMPREHENSIVE METABOLIC PANEL
CO2: 26 mEq/L (ref 19–32)
Calcium: 9.9 mg/dL (ref 8.4–10.5)
Creatinine, Ser: 1.09 mg/dL (ref 0.4–1.2)
GFR calc Af Amer: >60 mL/min (ref >60)
GFR calc non Af Amer: 50 mL/min — ABNORMAL LOW (ref >60)
Glucose, Bld: 59 mg/dL — ABNORMAL LOW (ref 70–99)
Total Protein: 8.2 g/dL (ref 6.0–8.3)

## 2010-07-26 LAB — APTT: aPTT: 53 seconds — ABNORMAL HIGH (ref 24–37)

## 2010-07-26 LAB — URINALYSIS, ROUTINE W REFLEX MICROSCOPIC
Ketones, ur: NEGATIVE mg/dL
Nitrite: NEGATIVE
Protein, ur: NEGATIVE mg/dL
Urobilinogen, UA: 0.2 mg/dL (ref 0.0–1.0)

## 2010-07-26 LAB — CBC
Hemoglobin: 11.2 g/dL — ABNORMAL LOW (ref 12.0–15.0)
MCHC: 33.3 g/dL (ref 30.0–36.0)
MCV: 96.3 fL (ref 78.0–100.0)
RBC: 3.51 MIL/uL — ABNORMAL LOW (ref 3.87–5.11)
RDW: 13.8 % (ref 11.5–15.5)

## 2010-07-26 LAB — PROTIME-INR: Prothrombin Time: 13.2 seconds (ref 11.6–15.2)

## 2010-07-30 ENCOUNTER — Other Ambulatory Visit: Payer: Self-pay | Admitting: *Deleted

## 2010-07-30 LAB — URINALYSIS, ROUTINE W REFLEX MICROSCOPIC
Bilirubin Urine: NEGATIVE
Nitrite: NEGATIVE
Protein, ur: NEGATIVE mg/dL
Urobilinogen, UA: 0.2 mg/dL (ref 0.0–1.0)

## 2010-07-30 LAB — CBC
HCT: 23.6 % — ABNORMAL LOW (ref 36.0–46.0)
Hemoglobin: 10.7 g/dL — ABNORMAL LOW (ref 12.0–15.0)
Hemoglobin: 8.1 g/dL — ABNORMAL LOW (ref 12.0–15.0)
MCHC: 33.6 g/dL (ref 30.0–36.0)
MCHC: 34.1 g/dL (ref 30.0–36.0)
MCHC: 34.1 g/dL (ref 30.0–36.0)
MCHC: 34.1 g/dL (ref 30.0–36.0)
MCV: 97.8 fL (ref 78.0–100.0)
MCV: 99.2 fL (ref 78.0–100.0)
Platelets: 360 10*3/uL (ref 150–400)
Platelets: 231 10*3/uL (ref 150–400)
RBC: 2.4 MIL/uL — ABNORMAL LOW (ref 3.87–5.11)
RBC: 2.41 MIL/uL — ABNORMAL LOW (ref 3.87–5.11)
RDW: 13.1 % (ref 11.5–15.5)
RDW: 12.9 % (ref 11.5–15.5)
RDW: 13.2 % (ref 11.5–15.5)
WBC: 9.2 10*3/uL (ref 4.0–10.5)

## 2010-07-30 LAB — BASIC METABOLIC PANEL
BUN: 11 mg/dL (ref 6–23)
CO2: 26 mEq/L (ref 19–32)
CO2: 28 mEq/L (ref 19–32)
Calcium: 8.9 mg/dL (ref 8.4–10.5)
Calcium: 9.5 mg/dL (ref 8.4–10.5)
Chloride: 105 mEq/L (ref 96–112)
Creatinine, Ser: 0.83 mg/dL (ref 0.4–1.2)
Creatinine, Ser: 1 mg/dL (ref 0.4–1.2)
GFR calc Af Amer: >60 mL/min (ref >60)
Glucose, Bld: 122 mg/dL — ABNORMAL HIGH (ref 70–99)

## 2010-07-30 LAB — CROSSMATCH
ABO/RH(D): A POS
Antibody Screen: NEGATIVE

## 2010-07-30 LAB — PROTIME-INR
INR: 1.1 (ref 0.00–1.49)
INR: 1.3 (ref 0.00–1.49)
Prothrombin Time: 12.7 seconds (ref 11.6–15.2)
Prothrombin Time: 14.5 seconds (ref 11.6–15.2)
Prothrombin Time: 16.6 seconds — ABNORMAL HIGH (ref 11.6–15.2)

## 2010-07-30 LAB — COMPREHENSIVE METABOLIC PANEL
ALT: 14 U/L (ref 0–35)
Albumin: 3.8 g/dL (ref 3.5–5.2)
Calcium: 9.4 mg/dL (ref 8.4–10.5)
GFR calc Af Amer: >60 mL/min (ref >60)
Glucose, Bld: 95 mg/dL (ref 70–99)
Sodium: 138 mEq/L (ref 135–145)
Total Protein: 7.2 g/dL (ref 6.0–8.3)

## 2010-07-30 LAB — GLUCOSE, CAPILLARY
Glucose-Capillary: 122 mg/dL — ABNORMAL HIGH (ref 70–99)
Glucose-Capillary: 125 mg/dL — ABNORMAL HIGH (ref 70–99)
Glucose-Capillary: 255 mg/dL — ABNORMAL HIGH (ref 70–99)

## 2010-07-30 MED ORDER — SIMVASTATIN 20 MG PO TABS: 20.0000 mg | ORAL_TABLET | Freq: Every day | ORAL | Status: DC

## 2010-08-01 ENCOUNTER — Encounter: Payer: Self-pay | Admitting: Cardiovascular Disease

## 2010-08-07 ENCOUNTER — Ambulatory Visit: Payer: Self-pay | Admitting: Cardiovascular Disease

## 2010-08-21 ENCOUNTER — Ambulatory Visit (INDEPENDENT_AMBULATORY_CARE_PROVIDER_SITE_OTHER): Payer: Medicare Other | Admitting: Cardiovascular Disease

## 2010-08-21 ENCOUNTER — Encounter: Payer: Self-pay | Admitting: Cardiovascular Disease

## 2010-08-21 VITALS — BP 192/80 | HR 50 | Ht 64.0 in | Wt 181.2 lb

## 2010-08-21 DIAGNOSIS — I10 Essential (primary) hypertension: Secondary | ICD-10-CM

## 2010-08-21 DIAGNOSIS — I4891 Unspecified atrial fibrillation: Secondary | ICD-10-CM

## 2010-08-21 MED ORDER — POTASSIUM CHLORIDE ER 10 MEQ PO CPCR: 10.0000 meq | ORAL_CAPSULE | Freq: Every day | ORAL | Status: DC

## 2010-08-21 MED ORDER — HYDROCHLOROTHIAZIDE 25 MG PO TABS: 25.0000 mg | ORAL_TABLET | ORAL | Status: DC | PRN

## 2010-08-21 NOTE — Assessment & Plan Note (Signed)
She remains in sinus rhythm. She is not taking her Cardizem or digoxin because is causes her heart rate to slow too much. I have discontinued each of these medications.

## 2010-08-21 NOTE — Progress Notes (Signed)
Becky Forbes Date of Birth  [birth year] [facility] [address]     [address] [place], [place]  [address] [phone]  Fax  [phone]  History of Present Illness:  Becky Forbes presents today for followup of her hypertension. She also has a history of atrial fibrillation. [name] felt fairly well since I last saw her 6 months ago. She occasionally has episodes of dizziness. She relates this to episodes of bradycardia.  Current Outpatient Prescriptions on File Prior to Visit  Medication Sig Dispense Refill  . amLODipine (NORVASC) 5 MG tablet Take 5 mg by mouth daily.        Marland Kitchen aspirin 81 MG tablet Take 81 mg by mouth daily.        . Cholecalciferol (VITAMIN D PO) Take 800 Units by mouth daily.        . Multiple Vitamin (MULTIVITAMIN) tablet Take 1 tablet by mouth daily.        . nebivolol (BYSTOLIC) 10 MG tablet Take 5 mg by mouth daily.        . propranolol (INDERAL) 10 MG tablet Take 10 mg by mouth daily.       . ramipril (ALTACE) 10 MG capsule Take 10 mg by mouth 2 (two) times daily.        . simvastatin (ZOCOR) 20 MG tablet Take 1 tablet (20 mg total) by mouth at bedtime.  90 tablet  0  . DISCONTD: diltiazem (CARDIZEM CD) 240 MG 24 hr capsule Take 240 mg by mouth as needed.        Marland Kitchen DISCONTD: hydrochlorothiazide 25 MG tablet Take 25 mg by mouth as needed.        Marland Kitchen DISCONTD: metFORMIN (GLUCOPHAGE) 1000 MG tablet Take 1,000 mg by mouth 2 (two) times daily with a meal.        . DISCONTD: risedronate (ACTONEL) 35 MG tablet Take 35 mg by mouth every 7 (seven) days. with water on empty stomach, nothing by mouth or lie down for next 30 minutes.       Marland Kitchen DISCONTD: sitaGLIPtan (JANUVIA) 100 MG tablet Take 100 mg by mouth daily.          Allergies  Allergen Reactions  . Hydrocodone-Acetaminophen     REACTION: RASH,NAUSEA,ITCHING  . Morphine   . Morphine And Related Itching, Nausea And Vomiting and Rash    Past Medical History  Diagnosis Date  . Atrial  fibrillation   . Diabetes mellitus   . HTN (hypertension)   . Hyperlipemia     Past Surgical History  Procedure Date  . Cataract extraction 05/25/07    RIGHT EYE  . Appendectomy   . Carpal tunnel release   . Abdominal hysterectomy   . Lumbar laminectomy   . Foot surgery 2006    MID FOOT RECONSTRUCTION  . Osteotomy 2006    AND FUSION    History  Smoking status  . Never Smoker   Smokeless tobacco  . Not on file    History  Alcohol Use No    Family History  Problem Relation Age of Onset  . Coronary artery disease Father   . Hypertension Father   . Stroke Father   . Diabetes Father   . Kidney failure Mother   . Hypertension Mother   . Other Brother     SEPSIS  . Diabetes Brother   . Diabetes Sister   . Kidney failure Sister     Reviw of Systems:  Reviewed  in the HPI.  All other systems are negative.  Physical Exam: BP 192/80  Pulse 50  Ht 5\' 4"  (1.626 m)  Wt 181 lb 3.2 oz (82.192 kg)  BMI 31.10 kg/m2 The patient is alert and oriented x 3.  The mood and affect are normal.  The skin is warm and dry.  Color is normal.  The HEENT exam reveals that the sclera are nonicteric.  The mucous membranes are moist.  The carotids are 2+ without bruits.  There is no thyromegaly.  There is no JVD.  The lungs are clear.  The chest wall is non tender.  The heart exam reveals a regular rate with a normal S1 and S2.  There are no murmurs, gallops, or rubs.  The PMI is not displaced.   Abdominal exam reveals good bowel sounds.  There is no guarding or rebound.  There is no hepatosplenomegaly or tenderness.  There are no masses.  Exam of the legs reveal no clubbing, cyanosis, or edema.  The legs are without rashes.  The distal pulses are intact.  Cranial nerves II - XII are intact.  Motor and sensory functions are intact.  The gait is normal.  ECG: Sinus bradycardia. She has no ST or T wave changes. Assessment / Plan:

## 2010-08-21 NOTE — Assessment & Plan Note (Signed)
Becky Forbes presents with persistently elevated blood pressure. She was started on HCTZ but only took it  for several days. Will restart her on HCTZ 25 mg a day. We'll also add potassium chloride 10 mEq a day. I've asked her to watch her salt intake and exercise as much is possible. I'll see her again in 3 months for office visit as well as a basic metabolic profile.

## 2010-08-28 NOTE — H&P (Signed)
NAME:  Becky Forbes, Becky Forbes NO.:  [id number]   MEDICAL RECORD NO.:  [id number]          PATIENT TYPE:  INP   LOCATION:  1605                         FACILITY:  [facility]   PHYSICIAN:  Ollen Gross, M.D.    DATE OF BIRTH:  11-09-1940   DATE OF ADMISSION:  07/25/2008  DATE OF DISCHARGE:                              HISTORY & PHYSICAL   CHIEF COMPLAINT:  Left knee pain.   HISTORY OF PRESENT ILLNESS:  The patient is a 70 year old female who has  been well-known to Dr. Lequita Halt and had previously undergone a right  total knee back in January of 2010 and has done well with that.  She now  presents to have the other side done.  Left knee continues to be a  problem and she elects to have the surgery.   ALLERGIES:  MORPHINE causes itching and rash, VICODIN causes itching and  nausea.   CURRENT MEDICATIONS:  1. Metformin.  2. Januvia.  3. Aspirin.  4. Altace.  5. Norvasc.  6. Multivitamin.  7. Propranolol.   PAST MEDICAL HISTORY:  1. Hypertension.  2. Atrial fibrillation.  3. Non-insulin dependent diabetes mellitus.  4. Hypercholesterolemia.  5. Degenerative disk disease.  6. Postmenopausal.   PAST SURGICAL HISTORY:  1. Appendectomy.  2. Abdominal hysterectomy.  3. Lumbar surgery in 1990 and again in 1999.  4. Bilateral cataract procedures.  5. Repair of hole in right eye.  6. Removal of ganglion wrist.  7. Recent right total knee.   FAMILY HISTORY:  Father deceased at age 24 diabetic with heart disease  and also hypertension.  Mother deceased at age 62 with hypertension,  arthritis and renal failure.  Sister age 58 with diabetes and  ventricular fibrillation.  Brother age 38 diabetic, pneumonia, history  of sepsis and colon cancer.   SOCIAL HISTORY:  Married.  Retired Engineer, civil (consulting).  Nonsmoker.  Two children.  She lives in a two-story home with two steps on entering.   REVIEW OF SYSTEMS:  GENERAL:  No fevers, chills or night sweats.  NEUROLOGY:  No seizures, syncope  or paralysis.  RESPIRATORY:  No  shortness of breath, productive cough or hemoptysis.  CARDIOVASCULAR:  No chest pain, angina or orthopnea.  GASTROINTESTINAL:  No nausea,  vomiting or diarrhea, constipation.  GENITOURINARY:  No dysuria,  hematuria or discharge.  MUSCULOSKELETAL:  Left knee.   PHYSICAL EXAMINATION:  VITAL SIGNS:  Pulse 56, respirations 12, blood  pressure 140/68.  GENERAL:  A 70 year old white female well-nourished and well-developed  in no acute distress.  She is alert, oriented, cooperative, pleasant and  an excellent historian.  HEENT:  Normocephalic and atraumatic.  Pupils equal, round, and reactive  to light.  EOMs intact.  She does have upper partial denture plate and  glasses for reading.  NECK:  Supple.  CHEST:  Clear.  HEART:  Regular rate and rhythm.  No murmur.  S1 and S2 noted.  ABDOMEN:  Soft and nontender.  Bowel sounds present.  RECTAL:  BREASTS:  GENITOURINARY:  Not done, not pertinent to present  illness.  EXTREMITIES:  Left knee range of motion 5 to 115 with marked crepitus  and no instability.   IMPRESSION:  Osteoarthritis of the left knee.   PLAN:  The patient is admitted to [facility] to  undergo a left total knee replacement arthroplasty.  The surgery will be  performed by Dr. Ollen Gross.      Alexzandrew L. Perkins, P.A.C.      Ollen Gross, M.D.  Electronically Signed    ALP/MEDQ  D:  07/26/2008  T:  07/26/2008  Job:  [id number]   cc:   Titus Dubin. Alwyn Ren, MD,FACP,FCCP  [phone] [address]  [place]  [place] [address]   Vesta Mixer, M.D.  Fax: [phone]

## 2010-08-28 NOTE — Discharge Summary (Signed)
NAME:  Becky Forbes, Becky Forbes NO.:  [id number]   MEDICAL RECORD NO.:  [id number]          PATIENT TYPE:  INP   LOCATION:  1605                         FACILITY:  [facility]   PHYSICIAN:  Ollen Gross, M.D.    DATE OF BIRTH:  04-17-1940   DATE OF ADMISSION:  07/25/2008  DATE OF DISCHARGE:  07/28/2008                               DISCHARGE SUMMARY   ADMITTING DIAGNOSES:  1. Osteoarthritis left knee.  2. Hypertension.  3. History of atrial fibrillation.  4. Non-insulin-dependent diabetes mellitus.  5. Hypercholesterolemia.  6. Degenerative disk disease.  7. Postmenopausal.   DISCHARGE DIAGNOSES:  1. Osteoarthritis left knee, status post left total knee replacement      arthroplasty.  2. Postoperative acute blood loss anemia; did not require transfusion.  3. Postoperative hyponatremia, improved.  4. Postoperative hyperkalemia, improved.  5. Hypertension.  6. History of atrial fibrillation.  7. Non-insulin-dependent diabetes mellitus.  8. Hypercholesterolemia.  9. Degenerative disk disease.  10.Postmenopausal.   PROCEDURE:  July 25, 2008 - left total knee.  Surgeon - Dr. Lequita Halt.  Assistant - Avel Peace PA-C.  Anesthesia - general.   CONSULTATIONS:  None.   BRIEF HISTORY:  Becky Forbes is a 70 year old female with end-stage arthritis of  the left knee, progressive worsening pain and dysfunction.  She has had  a successful right total knee and now presents for a left total knee.   LABORATORY DATA:  Preoperative CBC showed a hemoglobin of 11.2,  hematocrit 33.7, white cell count 8.4, platelets of 357.  PT/INR of 13.2  and 1.0 with a PTT of 53.  Chem panel on admission with slightly low  glucose of 59.  Remaining Chem panel within normal limits.  Preoperative  UA was negative.  Serial CBCs were followed throughout the hospital  course.  Hemoglobin dropped down to 8.9 and then 8.2.  The last noted  hemoglobin and hematocrit were 7.9 and 23.2.  Please note, the patient  was  asymptomatic and did not want any blood.  Serial pro times followed  per Coumadin protocol.  Last noted PT/INR of 24.2 and 2.0.  Serial BMETs  were followed.  The sodium dropped from 143 down to 133 where it  stabilized for a day and then came up to 135.  The potassium came up  from 4.9, got as high as 5.8 but came back down to a normal level 4.4.  Remaining BMETs within normal limits.   X-RAY:  Two-view chest on July 13, 2008 - no acute disease.   ELECTROCARDIOGRAM:  EKG on July 13, 2008 - sinus rhythm with short PR  confirmed by Dr. Daleen Squibb.   HOSPITAL COURSE:  The patient was admitted to [facility].  Taken to the OR.  Underwent the above-stated procedure without  complication.  The patient tolerated the procedure well and was later  transferred to the recovery room and the orthopedic floor.  Started on  PCA and p.o. analgesic pain control following surgery and given 24 hours  of postoperative IV antibiotics.  Hemoglobin was a little low on  postoperative day #1, so  we started her on iron.  Had good output.  Started getting up out of bed.  She was placed back on her home  medications.  Her blood pressure was a little low normal, so her Altace  was held and her Norvasc put on parameters.  Metformin was put on hold  just to ensure she had a good diet and her renal function was working  properly.  On day #1, she did extremely well, walking about 300 feet.  By day #2, hemoglobin was a little bit lower.  She stayed on the iron,  asymptomatic with this.  Her blood pressure was fine.  Sodium was down a  little bit, though, probably due to a dilutional component from surgery.  Rechecked her lytes and discontinued the fluids.  By day #3, July 28, 2008, she was doing well.  Hemoglobin was low at 7.9.  She was  asymptomatic with this.  She was walking over 300 feet without symptoms.  She was on iron; did not want blood.  Dressing changed on day #2 and day  #3.  The incision was  healing well.  The sodium had corrected itself.  It had already started coming back up.  She had a bump in her potassium  which went up, but it was back down to normal level when she was  discharged home.   DISCHARGE PLAN:  1. The patient was discharged home on July 28, 2008.  2. Discharge diagnoses - please see above.  3. Discharge medications - Percocet, Robaxin, Coumadin, iron.  4. Follow up in 2 weeks.  5. Activity - total knee protocol, weightbearing as tolerated.  Home      health PT, home health nursing.  May start showering; however, do      not submerge the incision in water.   DISPOSITION:  Home.   CONDITION UPON DISCHARGE:  Improving.      Alexzandrew L. Perkins, P.A.C.      Ollen Gross, M.D.  Electronically Signed    ALP/MEDQ  D:  07/28/2008  T:  07/28/2008  Job:  [id number]   cc:   Titus Dubin. Alwyn Ren, MD,FACP,FCCP  [phone]  [place]  [place] [address]   Vesta Mixer, M.D.  Fax: [phone]

## 2010-08-28 NOTE — Op Note (Signed)
NAME:  ZAKYRA, KUKUK NO.:  [id number]   MEDICAL RECORD NO.:  [id number]          PATIENT TYPE:  INP   LOCATION:  0006                         FACILITY:  [facility]   PHYSICIAN:  Ollen Gross, M.D.    DATE OF BIRTH:  Oct 18, 1940   DATE OF PROCEDURE:  05/02/2008  DATE OF DISCHARGE:                               OPERATIVE REPORT   PREOPERATIVE DIAGNOSIS:  Osteoarthritis right knee.   POSTOPERATIVE DIAGNOSIS:  Osteoarthritis right knee.   PROCEDURE:  Right total knee arthroplasty.   SURGEON:  F. Aluisio, M.D.   ASSISTANT:  Arlyn Leak, PA-C   ANESTHESIA:  General with postoperative Marcaine pain pump.   ESTIMATED BLOOD LOSS:  Minimal.   DRAINS:  None.   TOURNIQUET TIME:  29 minutes at 300 mmHg.   COMPLICATIONS:  None.   CONDITION:  Stable to recovery.   BRIEF CLINICAL NOTE:  Becky Forbes is a 70 year old female with end-stage  arthritis of the right knee with progressively worsening pain and  dysfunction.  She has failed nonoperative management and presents now  for right total knee arthroplasty.   PROCEDURE IN DETAIL:  After successful administration of general  anesthetic, a tourniquet is placed high on her right thigh and right  lower extremity prepped and draped in the usual sterile fashion.  The  extremity is wrapped in Esmarch, knee flexed, and tourniquet inflated to  300 mmHg.  A midline incision is made with a 10 blade through  subcutaneous tissue to the level of the extensor mechanism.  A fresh  blade is used to make a medial parapatellar arthrotomy.  Soft tissue  over the proximal medial tibia is subperiosteally elevated to the joint  line with the knife into the semimembranosus bursa with a Cobb elevator.  Soft tissue laterally is elevated with attention being paid to avoid  patellar tendon on tibial tubercle.  The patella subluxed laterally,  knee flexed to 90 degrees and ACL and PCL removed.  A drill is used to  create a starting hole in the  distal femur and the canal is thoroughly  irrigated.  A 5 degree right valgus alignment guide is placed  referencing off the posterior condyles.  Rotation is marked and the  block pinned to remove 10 mm of the distal femur.  Distal femoral  resection is made with an oscillating saw.  Sizing blocks are placed,  size 3 is the most appropriate.  Rotation is marked at the epicondylar  axis.  Size 3 cutting blocks placed in the anterior-posterior and  chamfer cuts made.   The tibia subluxed forward and menisci removed.  Extramedullary tibial  alignment guide is placed referencing proximally at the medial aspect of  the tibial tubercle and distally along the second metatarsal axis and  tibial crest.  Blocks pinned to remove about 4 mm from the more  deficient medial side.  Tibial resection is made with an oscillating  saw.  Size 3 is most appropriate tibial component and the proximal tibia  is prepared with the modular drill and keel punch for size a 3.  Femoral  preparation is completed with the intercondylar cut.   A size 3 mobile bearing tibial trial, size 3 posterior stabilized  femoral trial, and a 10 mm posterior stabilized rotating platform insert  trial are placed.  With the 10, full extension is achieved with  excellent varus-valgus anterior-posterior balance throughout full range  of motion.  The patella is then everted and thickness measured to be 23  mm.  Freehand resection is taken to 13 mm, 30 template is placed, lug  holes are drilled, trial patella is placed and it tracks normally.  Osteophytes are removed off the posterior femur with the trial in place.  All trials are removed and the cut bone surfaces prepared with pulsatile  lavage.  Cement is mixed and once ready for implantation, a size 3  mobile bearing tibia, size 3 posterior stabilized femur, and 38 patella  are cemented into place.  The patella is held with a clamp.  Trial 10-mm  inserts are placed and knee held in  full extension and all extruded  cement removed.  When the cement is fully hardened, then the permanent  10 mm posterior stabilized rotating platform insert is placed into the  tibial tray.  The wound is copiously irrigated with saline solution and  then the FloSeal injected on the posterior capsule, in medial and  lateral gutters, and suprapatellar area.  A moist sponge is placed and  tourniquet released for a  total time of 29 minutes.  The sponge is held  for 2 minutes and then removed.  Minimal bleeding is encountered.  The  bleeding that is encountered is stopped with electrocautery.  The wounds  again are copiously irrigated and the arthrotomy closed with interrupted  #1 PDS.  Flexion against gravity is about 140 degrees.  The subcu is  closed with interrupted 2-0 Vicryl and subcuticular running 4-0  Monocryl.  The catheter for the Marcaine pain pump is placed and the  pump is initiated.  Steri-Strips and a bulky sterile dressing are  applied and she is placed into a knee immobilizer, awakened and  transported to recovery in stable condition.      Ollen Gross, M.D.  Electronically Signed     FA/MEDQ  D:  05/02/2008  T:  05/02/2008  Job:  [id number]

## 2010-08-28 NOTE — Op Note (Signed)
NAME:  RABECKA, BRENDEL NO.:  [id number]   MEDICAL RECORD NO.:  [id number]          PATIENT TYPE:  INP   LOCATION:  1605                         FACILITY:  [facility]   PHYSICIAN:  Ollen Gross, M.D.    DATE OF BIRTH:  Feb 20, 1941   DATE OF PROCEDURE:  07/25/2008  DATE OF DISCHARGE:                               OPERATIVE REPORT   PREOPERATIVE DIAGNOSIS:  Osteoarthritis left knee.   POSTOPERATIVE DIAGNOSIS:  Osteoarthritis left knee.   PROCEDURE:  Left total knee arthroplasty.   SURGEON:  Ollen Gross, M.D.   ASSISTANT:  Avel Peace, PA-C   ANESTHESIA:  General with postop Marcaine pain pump.   ESTIMATED BLOOD LOSS:  Minimal.   DRAINS:  None.   TOURNIQUET TIME:  30 minutes at 300 mmHg.   COMPLICATIONS:  None.   CONDITION:  Stable to recovery room.   CLINICAL NOTE:  Kathaleya is 70 year old female with end-stage arthritis of  the left knee with progressively worsening pain and dysfunction.  She  has had a recent successful right total knee arthroplasty and presents  now for left total knee arthroplasty.   PROCEDURE IN DETAIL:  After successful administration of general  anesthetic a tourniquet is placed high on her left thigh and left lower  extremity is prepped and draped in the usual sterile fashion.  Extremities wrapped in Esmarch, knee flexed, tourniquet inflated to 300  mmHg.  Midline incision made with a 10 blade through subcutaneous tissue  to the level of the extensor mechanism.  A fresh blade is used to make a  medial parapatellar arthrotomy.  Soft tissue on the proximal medial  tibia subperiosteally elevated to the joint line with the knife, into  the semimembranosus bursa with a Cobb elevator.  Soft tissue laterally  is elevated with attention being paid to avoid patellar tendon on tibial  tubercle.  The patella subluxed laterally, knee flexed 90 degrees, ACL  and PCL removed.  Drill used to create a starting hole in the distal  femur.  The canal  thoroughly irrigated.  The 5-degree left valgus  alignment guide is placed and referencing off the posterior condyles  rotations marked and the block pinned to remove 11 mm of the distal  femur.  Took 11 because of preop flexion contracture.  Distal femoral  resection is made with an oscillating saw.  Sizing blocks placed, size 3  is most appropriate.  Rotations marked off the epicondylar axis.  Size 3  cutting block is placed and the anterior, posterior chamfer cuts made.   Tibia subluxed forward and menisci removed.  Extramedullary tibial  alignment guide is placed referencing proximally at the medial aspect of  tibial tubercle and distally along the second metatarsal axis and tibial  crest.  Blocks pinned to remove about 10 mm of non-deficient lateral  side.  Tibial resection is made with an oscillating saw.  Size 3 is the  most appropriate tibial component and the proximal tibia is prepared to  modular drill and keel punch for the size 3.  Femoral preparation is  completed with the intercondylar  cut.   Size 3 mobile bearing tibial trial, size 3 posterior stabilized femoral  trial and a 10-mm posterior stabilized rotating platform insert trial  are placed.  A 10 has a little bit of hyperextension so went to 12.5  which allowed for full extension with excellent varus-valgus anterior-  posterior balance throughout full range of motion.  The patella was  everted and thickness measured to be 22 mm.  Freehand resection is taken  to 12 mm, 38 template is placed, lug holes were drilled, trial patella  was placed and it tracks normally.  Osteophytes were removed off the  posterior femur with the trial in place.  All trials are removed and the  cut bone surfaces are prepared with pulsatile lavage.  Cement is mixed  and once ready for implantation, the size 3 mobile bearing tibial tray,  size 3 posterior stabilized femur and 38 patella are cemented into place  and patella was held with a clamp.   Trial 12.5 inserts placed, knee held  in full extension and all extruded cement removed.  When the cement is  fully removed, and the wound is copiously irrigated with saline  solution, then the FloSeal injected on the posterior capsule medial and  lateral gutters and suprapatellar area.  Moist sponge is placed and  tourniquet released for total time of 30 minutes.  Sponges held for 2  minutes and then removed.  Minimal bleeding was encountered.  The  bleeding that is encountered stopped with electrocautery.  Wound is  again irrigated and the arthrotomy closed with interrupted #1 PDS.  Flexion against gravity is about 140 degrees.  Subcu was closed with  interrupted 2-0 Vicryl and subcuticular with running 4-0 Monocryl.  Incisions cleaned and dried and Steri-Strips and a bulky sterile  dressing are applied.  She is then placed into a knee immobilizer,  awakened and transferred to recovery in stable condition.      Ollen Gross, M.D.  Electronically Signed     FA/MEDQ  D:  07/25/2008  T:  07/25/2008  Job:  [id number]

## 2010-08-28 NOTE — Op Note (Signed)
NAME:  Becky Forbes, Becky Forbes                  ACCOUNT NO.:  [id number]   MEDICAL RECORD NO.:  [id number]          PATIENT TYPE:  AMB   LOCATION:  SDS                          FACILITY:  [facility]   PHYSICIAN:  Alford Highland. Rankin, M.D.   DATE OF BIRTH:  01-Sep-1940   DATE OF PROCEDURE:  06/03/2007  DATE OF DISCHARGE:  06/03/2007                               OPERATIVE REPORT   PREOPERATIVE DIAGNOSIS:  Stage III macular hole, right eye.   POSTOPERATIVE DIAGNOSIS:  Stage III macular hole, right eye.   PROCEDURE:  1. Posterior vitrectomy membrane peel, OD, internal limiting      membranes.  2. Injection of vitreous substance, 20%, OD.   SURGEON:  Alford Highland. Rankin, M.D.   ANESTHESIA:  Local retrobulbar with monitored anesthesia control.   INDICATIONS FOR PROCEDURE:  Patient is a 70 year old woman who has  profound visual loss in the right eye on the basis of idiopathic stage  III macular hole.  Patient understands this is an attempt to close the  macular hole or allow for closure of the macular hole by performance of  a vitrectomy membrane peel.  She understands the risks of anesthesia,  including rare occurrence of death, loss to the eye, including but not  limited to hemorrhage, infection, scarring, need for further surgery, no  change in vision, loss in vision, progressive disease despite  intervention.  Appropriate signed consent was obtained.  The patient was  taken to the operating room.   In the operating room, appropriate monitoring was followed by mild  sedation, 2% Xylocaine injected with 5 cc in the retrobulbar, followed  by an additional 5 cc lateral in a fashion-modified Darel Hong.  The right  periocular region was sterilely prepped and draped in the usual  ophthalmic fashion.  A lid speculum applied.  A 25 gauge trocar placed  into the infratemporal quadrant.  A superior trocar was applied.  Core  vitrectomy was then begun.  Position-induced hyaloid detachment was then  carried out with  active suction nasal to the optic nerve, where the  hyaloid was removed, elevated off the posterior pole, anterior to the  equator at 360 degrees.  Vitreous scar trimmed at 360 degrees.  Fluid  exchange completed.  Under air, a dilute solution of ICG, 1 cc of ICG  with 5 cc of VSS was then used to cover the posterior pole.  This allows  for excellent staining of the internal limiting membrane.  At this time,  the ICG was aspirated completely with a flexible-tipped cannula.  Thereafter, an air-fluid exchange completed.  Under fluid, membrane peel  was then carried out using Ecker forceps.   The membrane peel was successful in mobilizing the margins of the  macular hole.  Peel was then carried out for a diameter of approximately  1.5 to 2 disk diameters radius around the macular hole.   At this time, fluid/air exchange completed.  The hole edges did appear  to be mobilized.   At this time, air __________ 20% exchange then completed.  The superior  trocars were removed.  The infusion removed.  The intraocular pressure  was assessed and found to be adequate.  Subconjunctival Decadron  applied.  A sterile patch and Fox shield applied.  The patient tolerated  the procedure well without complications.      Alford Highland Rankin, M.D.  Electronically Signed     GAR/MEDQ  D:  06/03/2007  T:  06/04/2007  Job:  [id number]

## 2010-08-28 NOTE — H&P (Signed)
NAME:  Becky Forbes, Becky Forbes NO.:  [id number]   MEDICAL RECORD NO.:  [id number]          PATIENT TYPE:  INP   LOCATION:  0006                         [facility]ACILITY:  [facility]   PHYSICIAN:  Ollen Gross, M.D.    DATE OF BIRTH:  [birth year]   DATE OF ADMISSION:  05/02/2008  DATE OF DISCHARGE:                              HISTORY & PHYSICAL   Date of office visit, history and physical April 05, 2008.   CHIEF COMPLAINT:  Bilateral knee pain.   HISTORY OF PRESENT ILLNESS:  The patient is 70 year old female who has  been seen by Dr. Lequita Halt for ongoing bilateral knee pain.  She is found  to have no significant arthritis in both knees; basically bone-on-bone  in both.  She is going to need her knee replacements.  She has been  treated conservatively in the past for arthritis with injections  including Synvisc which have only provided temporary relief.  She has  been seen preoperatively by Dr. Alwyn Ren and also by Dr. Elease Hashimoto who felt  that she is doing well and low risk for upcoming surgery.  It was felt  she would benefit from undergoing surgical intervention.  She would like  proceed with the right knee.   ALLERGIES:  MORPHINE causes rash and itching, VICODIN itching and  nausea.   CURRENT MEDICATIONS:  1. Actonel.  2. Januvia.  3. Metformin.  4. Altace.  5. Norvasc.  6. Glimepiride.  7. Aspirin 81 mg.  8. Multivitamin.  9. Propranolol.   PAST MEDICAL HISTORY:  1. Hypertension.  2. Atrial fibrillation.  3. Non-insulin-dependent diabetes mellitus.  4. Osteopenia.  5. Degenerative disk disease.  6. Postmenopausal.  7. History of phlebitis.   PAST SURGICAL HISTORY:  1. Appendectomy.  2. Abdominal hysterectomy.  3. Lumbar surgery in 1990 and again in 1999.  4. Bilateral cataract procedures.  5. Repair hole in right eye.  6. Also removal of ganglion right wrist.  She does have occasional      nausea with anesthesia.   FAMILY HISTORY:  Father deceased at age 105  diabetic heart disease,  vascular disease.  Mother deceased at age 12 with renal failure.  Sister  age 94 is a diabetic.  Another brother at age 100 diabetic, pneumonia,  and history of sepsis.  She has 2 daughters.   SOCIAL HISTORY:  Married, retired Astronomer., nonsmoker.  Two children, 2  daughters that is.  Spouse will be helping with her care after surgery.  She lives in a two-story home, one-step entering.   REVIEW OF SYSTEMS:  GENERAL:  No fevers, chills or night sweats.  NEUROLOGIC:  No seizures, syncope or paralysis.  RESPIRATORY:  No  shortness of breath, productive cough or hemoptysis.  CARDIOVASCULAR:  No chest pain, orthopnea.  GI:  No nausea, vomiting, diarrhea,  constipation.  GU:  No dysuria, hematuria or discharge.  MUSCULOSKELETAL:  Bilateral knee pain and occasional back pain.   PHYSICAL EXAMINATION:  VITAL SIGNS:  Pulse 60, respirations 14, blood  pressure 146/60.  GENERAL:  A 67-year white female well-nourished, well-developed in no  acute distress.  She is alert, oriented, and cooperative, excellent  historian.  HEENT:  Normocephalic, atraumatic.  Pupils are round and reactive.  EOMs  intact.  NECK:  Supple.  CHEST:  Clear.  HEART:  Regular rate and rhythm.  No murmur, S1, S2 noted.  ABDOMEN:  Soft, nontender.  Bowel sounds present.  RECTAL/BREASTS/GENITALIA:  Not done.  Not pertinent to present illness.  EXTREMITIES:  Right knee shows range of motion 5-115, marked crepitus,  no instability; left knee shows range of motion 5-115, marked crepitus,  no instability.   IMPRESSION:  Osteoarthritis bilateral knees.   PLAN:  The patient will be admitted to [facility] to undergo a  right total knee replacement arthroplasty.  Surgery will be performed by  Dr. Ollen Gross.      Alexzandrew L. Perkins, P.A.C.      Ollen Gross, M.D.  Electronically Signed    ALP/MEDQ  D:  05/01/2008  T:  05/02/2008  Job:  [id number]   cc:   Vesta Mixer, M.D.  Fax:  [phone]   Titus Dubin. Alwyn Ren, MD,FACP,FCCP  [phone]  [place] [address]

## 2010-08-28 NOTE — Procedures (Signed)
CAROTID DUPLEX EXAM   INDICATION:  Bilateral carotid bruit.   HISTORY:  Diabetes:  Yes.  Cardiac:  Atrial fibrillation.  Hypertension:  Yes.  Smoking:  No.  Previous Surgery:  No.  CV History:  No.  Amaurosis Fugax No, Paresthesias No, Hemiparesis No                                       RIGHT             LEFT  Brachial systolic pressure:         174               160  Brachial Doppler waveforms:         WNL               WNL  Vertebral direction of flow:        Antegrade         Antegrade  DUPLEX VELOCITIES (cm/sec)  CCA peak systolic                   110               168  ECA peak systolic                   120               139  ICA peak systolic                   129               223  ICA end diastolic                   36                45  PLAQUE MORPHOLOGY:                  Heterogeneous     Heterogeneous  PLAQUE AMOUNT:                      Mild              Moderate  PLAQUE LOCATION:                    Bif/ICA           Bif/ICA   IMPRESSION:  1. 40-59% right internal carotid artery stenosis.  2. 60-79% left internal carotid artery stenosis.        ___________________________________________  V. Charlena Cross, MD   AC/MEDQ  D:  10/20/2008  T:  10/20/2008  Job:  [id number]

## 2010-08-31 NOTE — Op Note (Signed)
NAME:  Becky Forbes, Becky Forbes                            ACCOUNT NO.:  [id number]   MEDICAL RECORD NO.:  [id number]                   PATIENT TYPE:  AMB   LOCATION:  ENDO                                 FACILITY:  [facility]   PHYSICIAN:  Petra Kuba, M.D.                 DATE OF BIRTH:  May 10, 1940   DATE OF PROCEDURE:  08/15/2003  DATE OF DISCHARGE:                                 OPERATIVE REPORT   PROCEDURE:  Colonoscopy.   INDICATIONS:  Family history of colon cancer.  Overdue for colonic  screening.   Consent was signed after risks, benefits, methods, options thoroughly  discussed in the office.   MEDICINES USED:  Demerol 100, Versed 11.   PROCEDURE:  Rectal inspection is pertinent for external hemorrhoids, small.  Digital exam was negative.  The video pediatric adjustable colonoscope was  inserted and with some difficulty due to a tortuous, looping colon, was able  to be advanced to the cecum.  This did require rolling her on her back and  some abdominal pressure.  No obvious abnormality was seen on insertion.  The  cecum was identified by the appendiceal orifice and ileocecal valve.  The  scope was slowly withdrawn.  The prep was adequate.  There was some liquid  stool that required washing and suctioning with slow withdrawal back to the  rectum.  No abnormalities were seen, specifically, no polyps, tumors,  masses, or diverticula.  Once back in the rectum, anorectal pull-through and  retroflexion confirmed some small hemorrhoids.  The scope was reinserted  shortways up the left side of the colon.  Air was suctioned.  Scope removed.  The patient tolerated the procedure well.  There were no obvious immediate  complications.   DIAGNOSES:  1. Internal/external hemorrhoids.  2. Tortuous colon.  3. Otherwise within normal limits to the cecum.   PLAN:  1. Yearly rectals and guaiacs per Dr. Alwyn Ren.  2. Happy to see back p.r.n.  3. Repeat screening in five years.  Might consider virtual  colonoscopy at     that junction based on tortuosity, if widely acceptable and covered by     insurance, otherwise would proceed possibly with the regular scope.                                               Petra Kuba, M.D.    MEM/MEDQ  D:  08/15/2003  T:  08/15/2003  Job:  [id number]   cc:   Titus Dubin. Alwyn Ren, M.D. [facility]

## 2010-08-31 NOTE — Discharge Summary (Signed)
NAME:  ELIDE, STALZER NO.:  [id number]   MEDICAL RECORD NO.:  [id number]          PATIENT TYPE:  INP   LOCATION:  1609                         FACILITY:  [facility]   PHYSICIAN:  Ollen Gross, M.D.    DATE OF BIRTH:  [birth year]   DATE OF ADMISSION:  05/02/2008  DATE OF DISCHARGE:  05/05/2008                               DISCHARGE SUMMARY   ADMITTING DIAGNOSES:  1. Osteoarthritis, bilateral knees.  2. Hypertension.  3. Atrial fibrillation.  4. Non-insulin-dependent diabetes mellitus.  5. Osteopenia.  6. Degenerative disk disease.  7. Postmenopausal.  8. History of phlebitis.   DISCHARGE DIAGNOSES:  1. Osteoarthritis, right knee, status post right total knee      replacement arthroplasty.  2. Acute blood loss anemia, did not require transfusion.  3. Mild postoperative hyperkalemia, possible hemolysis of blood,      resolved.  4. Osteoarthritis, bilateral knees.  5. Hypertension.  6. Atrial fibrillation.  7. Non-insulin-dependent diabetes mellitus.  8. Osteopenia.  9. Degenerative disk disease.  10.Postmenopausal.  11.History of phlebitis.   PROCEDURE:  May 02, 2008:  Right total knee replacement  arthroplasty.  Surgeon:  Dr. Lequita Halt.  Assistant:  Oneida Alar, PA-C.  Anesthesia:  General.  Tourniquet time was 29 minutes.   CONSULTS:  None.   BRIEF HISTORY:  Becky Forbes is a 70 year old female with end-stage  arthritis of right knee, progressive worsening pain and dysfunction,  failed nonoperative management now presents for right total knee  arthroplasty.   LABORATORY DATA:  Preop CBC showed hemoglobin 10.7, hematocrit 31.8,  white cell count 9.2, platelets 360, postop hemoglobin down to 8.7, then  to 8.1 where it stabilized.  Last hemoglobin was 8.1 again, and a  hematocrit was 23.6.  PT/PTT preop 12.7 and 51 respectively.  INR 0.9.  Serial protimes were followed per Coumadin protocol.  Last known PT/INR  21 and 1.7 respectively.  Chem panel on  admission revealed all within  normal limits.  Serial BMETs were followed.  Potassium went up from 4.6  to 5.4 and back down to a normal of 4.6.  Preop UA showed trace ketones,  otherwise negative.  Blood group A positive.  EKG on the chart dated  February 1999:  Atrial fibrillation, rapid ventricular response,  possible anterior infarct, abnormal confirmed by Dr. Alwyn Ren.  There is  another EKG on the chart dated May 26, 2007:  Normal sinus rhythm,  no ST and T abnormality confirmed by Dr. Elease Hashimoto.   HOSPITAL COURSE:  The patient was admitted to [facility] and  taken to the OR, underwent above said procedure without complication.  The patient tolerated procedure well, later transferred from recovery  room to the orthopedic floor, started back on her home medications,  given 24 hours postop IV antibiotics.  Started back on Coumadin.  She  had decent output.  Blood loss anemia was noted, but she was  asymptomatic with this.  We did put her on iron.  There was a little bit  of elevation in her potassium.  There was questionable hemolysis of her  meds.  We have rechecked that and continued her on her propranolol for  the atrial fibrillation.  She was placed on sliding scale for the  diabetes.  She actually did extremely well on day 1, walking over 70  feet.  By day 2, dressing was changed.  The hyperkalemia had resolved  itself.  Blood pressures were going back up, so all of her blood  pressure meds were resumed.  Hemoglobin was down to 8.1.  She was  asymptomatic with this, continued on the iron.  Incision looked good.  She continued to progress well, walking 50 and then 100 feet later that  day.  She is asymptomatic with the low blood, walking well by day 3.  Hemoglobin was stable at 8.1.  Incision looked good.  No complaints, and  she was discharged home.   DISCHARGE PLAN:  1. Patient was discharged home on May 05 2008.  2. Discharge diagnoses:  Please see above.  3.  Discharge medications:  Percocet, Robaxin, Nu-Iron, Coumadin.   DIET:  Heart-healthy diabetic diet.   FOLLOWUP:  Follow up in 2 weeks.   ACTIVITY:  Weightbearing as tolerated.  Home health PT, home health  nursing, total knee protocol.   DISPOSITION:  Home.   CONDITION UPON DISCHARGE:  Improving.      Alexzandrew L. Perkins, P.A.C.      Ollen Gross, M.D.  Electronically Signed    ALP/MEDQ  D:  06/11/2008  T:  06/11/2008  Job:  [id number]   cc:   Vesta Mixer, M.D.  Fax: [phone]   Titus Dubin. Alwyn Ren, MD,FACP,FCCP  [phone] [place]  [place] [address]

## 2010-08-31 NOTE — Op Note (Signed)
NAME:  Becky Forbes, Becky Forbes NO.:  [id number]   MEDICAL RECORD NO.:  [id number]          PATIENT TYPE:  AMB   LOCATION:  DSC                          FACILITY:  [facility]   PHYSICIAN:  Leonides Grills, M.D.     DATE OF BIRTH:  02/22/1941   DATE OF PROCEDURE:  04/18/2004  DATE OF DISCHARGE:                                 OPERATIVE REPORT   PREOPERATIVE DIAGNOSES:  1.  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  2.  Left tight gastrocnemius.   POSTOPERATIVE DIAGNOSES:  1.  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  2.  Left tight gastrocnemius.   OPERATION:  1.  Left first and second tarsometatarsal joint fusion with osteotomy.  2.  Left local bone graft.  3.  Stress x-rays left foot.  4.  Left gastrocnemius slide.   ANESTHESIA:  General endotracheal tube with popliteal block.   SURGEON:  Dr. Leonides Grills.   ASSISTANT:  Lianne Cure, P.A.-C.   ESTIMATED BLOOD LOSS:  Minimal.   TOURNIQUET TIME:  Approximately one hour and 20 minutes.   COMPLICATIONS:  None.   DISPOSITION:  Stable to the post anesthesia recovery.   INDICATIONS:  This is a 70 year old female who has had longstanding medial  left mid foot pain that was interfering with her life and what she wants to  do.  She has consented for the above procedure.  All risks which include  infection, nerve and vessel injury, nonunion, malunion, hardware addition or  hardware failure, persistence of pain, worsening pain, stiffness, arthritis  in neighboring joints, prolonged recovery were all explained.  Questions  were encouraged and answered.   OPERATION:  The patient was brought to the operating room, placed in the  supine position.  After adequate general endotracheal tube anesthesia was  administered as well as Ancef 1 gram IV piggyback as well as a popliteal  block by anesthesia, the left lower extremity was then prepped and draped in  a sterile manner  over a proximally placed thigh tourniquet.  We started the  procedure with a longitudinal incision over the medial head of the  gastrocnemius.  Dissection was carried out through the skin.  Hemostasis was  obtained.  The fascia was opened in line with the incision.  The conjoint  region was then developed between the gastrosoleus musculature.  Soft tissue  was then elevated off the posterior aspect of the gastrocnemius tendon.  The  sural nerve was identified and protected posteriorly throughout the  remaining portion of the procedure.  The gastroc tendon was then released  with a Mayo scissors.  This then acted as some release of the tight gastroc.  The area was copiously irrigated with normal saline.  The subcutaneous was  closed with 3-0 Vicryl, the skin was closed with 4-0 Monocryl subcuticular  stitch.  Steri-Strips are applied.  The limb was gravity exsanguinated and  the tourniquet was elevated to 290 mmHg.  A longitudinal incision over the  dorsal aspect between the EHL and EHB was then made.  Dissection was carried  out through the skin and hemostasis was obtained.  Neither tendon sheath was  violated.  Subperiosteal elevation was then performed around the base of the  first, second as well as over the medial and middle cuneiforms.  The first  and second tarsometatarsal joints were then entered.  The neurovascular  structures were protected dorsally and protected out of harm's way  throughout the case to include the dorsalis pedis, deep and superficial  peroneal nerves.  We then removed some bone spurs dorsally and this was  placed on the back table for local bone graft, also bone that was obtained  from drill bits throughout the procedure was placed on the back table for  local bone graft as well.  Stress x-rays were obtained and showed that there  was gross instability at the joint as was evidenced preoperatively as well.  A curved 0.25 inch osteotome was then used to denude  whatever cartilage was  left in the first and second tarsometatarsal joint and multiple 2 mm drill  holes were placed on either side of the joint.  We then osteotomized the  middle and medial cuneiform to make the joints flush and plantar flex it  adequately.  We then fixed the second tarsometatarsal joint first with the  two point reduction clamp.  We made a separate incision over the medial  aspect of the foot and placed a 3.5 mm full-threaded cortical set screw.  This was visualized in the AP, lateral and oblique planes to be in excellent  position.  We then anatomically reduced the first tarsometatarsal joint,  applied compression with a two point reduction clamp, burred a notch at the  base of the first metatarsal approximately 1.5 to 2 cm distal to the first  tarsometatarsal joint and then drilled a 3.5 and 2.5 mm drill holes,  respectively.  We placed a 3.5 mm fully-threaded cortical lag screw here as  well.  We then placed another screw from the medial cuneiform into the base  of the first metatarsal just lateral to the initial screw.  Again, this was  a lag screw.  This had excellent compression and maintenance of the  correction.  We then obtained x-rays in the AP, lateral and oblique planes  and showed that there was excellent alignment and placement of fixation and  proper correction as well.  There was no gross motion when stressed across  the arthrodesis site as well.  The area was copiously irrigated with normal  saline.  Stress strained relieving bone graft that was obtained locally  throughout the procedure was then placed over the first and second  tarsometatarsal joints using a bur, respectively and tamped into placed.  The tourniquet was deflated.  Hemostasis was obtained.  There was a palpable  dorsalis pedis pulse.  The subcutaneous was closed with 3-0 Vicryl.  The  skin was closed with 4-0 nylon.  Sterile dressing was applied.  A modified Jones' dressing was applied.   The patient was stable and went to PAR.      Paul   PB/MEDQ  D:  04/18/2004  T:  04/18/2004  Job:  [id number]

## 2010-09-06 ENCOUNTER — Other Ambulatory Visit: Payer: Self-pay | Admitting: Internal Medicine

## 2010-09-06 ENCOUNTER — Other Ambulatory Visit (INDEPENDENT_AMBULATORY_CARE_PROVIDER_SITE_OTHER): Payer: Medicare Other

## 2010-09-06 DIAGNOSIS — E785 Hyperlipidemia, unspecified: Secondary | ICD-10-CM

## 2010-09-06 DIAGNOSIS — I10 Essential (primary) hypertension: Secondary | ICD-10-CM

## 2010-09-06 DIAGNOSIS — IMO0001 Reserved for inherently not codable concepts without codable children: Secondary | ICD-10-CM

## 2010-09-06 LAB — LIPID PANEL
Cholesterol: 130 mg/dL (ref 0–200)
HDL: 42.6 mg/dL (ref >39.00)
LDL Cholesterol: 71 mg/dL (ref 0–99)
Triglycerides: 83 mg/dL (ref 0.0–149.0)
VLDL: 16.6 mg/dL (ref 0.0–40.0)

## 2010-09-06 LAB — BASIC METABOLIC PANEL
BUN: 32 mg/dL — ABNORMAL HIGH (ref 6–23)
CO2: 27 mEq/L (ref 19–32)
Chloride: 103 mEq/L (ref 96–112)
GFR: 44.95 mL/min — ABNORMAL LOW (ref >60.00)
Glucose, Bld: 96 mg/dL (ref 70–99)
Potassium: 5.5 mEq/L — ABNORMAL HIGH (ref 3.5–5.1)
Sodium: 137 mEq/L (ref 135–145)

## 2010-09-06 LAB — POTASSIUM: Potassium: 5.5 mEq/L — ABNORMAL HIGH (ref 3.5–5.1)

## 2010-09-06 LAB — MICROALBUMIN / CREATININE URINE RATIO: Microalb, Ur: 2.4 mg/dL — ABNORMAL HIGH (ref 0.0–1.9)

## 2010-09-13 ENCOUNTER — Encounter: Payer: Self-pay | Admitting: Internal Medicine

## 2010-09-13 ENCOUNTER — Ambulatory Visit (INDEPENDENT_AMBULATORY_CARE_PROVIDER_SITE_OTHER): Payer: Medicare Other | Admitting: Internal Medicine

## 2010-09-13 DIAGNOSIS — E782 Mixed hyperlipidemia: Secondary | ICD-10-CM

## 2010-09-13 MED ORDER — GLIMEPIRIDE 1 MG PO TABS: 1.0000 mg | ORAL_TABLET | Freq: Every day | ORAL | Status: AC

## 2010-09-13 NOTE — Patient Instructions (Signed)
Preventive Health Care: Exercise at least 30-45 minutes a day,  3-4 days a week.  Eat a low-fat diet with lots of fruits and vegetables, up to 7-9 servings per day. Avoid obesity; your goal is waist measurement < 40 inches.Consume less than 40 grams of sugar per day from foods & drinks with High Fructose Corn Sugar as #2,3 or # 4 on label. Check BUN, creat, K+, A1c in 4 months.Marland Kitchen

## 2010-09-13 NOTE — Progress Notes (Signed)
Addended byPecola Lawless on: 09/13/2010 09:14 AM   Modules accepted: Orders

## 2010-09-13 NOTE — Progress Notes (Signed)
Subjective:    Patient ID: Becky Forbes, female    DOB: [birth year], 70 y.o.   MRN: [id number]  HPI Diabetes status assessment: Fasting or morning glucose range:  76-104 or average :  Not averaged  . Highest glucose 2 hours after any meal:  < 180, usually < 160. Hypoglycemia :  no .                                                     Excess thirst :no;  Excess hunger:  no ;  Excess urination:  no.                                  Lightheadedness with standing:  no. Chest pain:  Only with AF ; Palpitations :no ;  Pain in  calves with walking:  no .                                                                                                    Non healing skin  ulcers or sores,especially over the feet:  no. Numbness or tingling or burning in feet : no .                                                                                                                                              Significant change in  Weight : stable. Vision changes : no  .                                                                    Exercise : walking 3-4X/week . Nutrition/diet:  Sugar Busters. Medication compliance : yes. Medication adverse  Effects:  no . Eye exam : appt 8/16. Foot care : no.  A1c/ urine microalbumin monitor:  A1c has dropped from 7.9-7.3; Microalbumin is also improved and is now 2.4.    Review of Systems     Objective:   Physical Exam General appearance is one of good health and nourishment w/o distress.  Eyes: No conjunctival inflammation or scleral icterus is present.  Heart:  Normal rate and regular rhythm. S4 with slurring; slight splitting  S2; without gallop, murmur, click, rub or other extra sounds     Lungs:Chest clear to auscultation; no wheezes, rhonchi,rales ,or rubs present.No increased work of breathing.   Skin:Warm & dry.  Intact without suspicious lesions or rashes ; no jaundice or tenting  Nail health is excellent; light touch is normal and the feet.  See  BP; it is usually 120/68- 132/70            Assessment & Plan:   #1 diabetes, improved with improved microscopic renal function.  #2 dyslipidemia, at goal  Plan: No change in medicines. Recheck A1c in 4 months.

## 2010-10-15 ENCOUNTER — Encounter: Payer: Self-pay | Admitting: Family Medicine

## 2010-10-31 ENCOUNTER — Telehealth: Payer: Self-pay | Admitting: Internal Medicine

## 2010-10-31 MED ORDER — SITAGLIPTIN PHOS-METFORMIN HCL 50-1000 MG PO TABS: 1.0000 | ORAL_TABLET | Freq: Two times a day (BID) | ORAL | Status: DC

## 2010-10-31 MED ORDER — AMLODIPINE BESYLATE 5 MG PO TABS: 5.0000 mg | ORAL_TABLET | Freq: Every day | ORAL | Status: DC

## 2010-10-31 MED ORDER — SIMVASTATIN 20 MG PO TABS: 20.0000 mg | ORAL_TABLET | Freq: Every day | ORAL | Status: DC

## 2010-10-31 MED ORDER — RAMIPRIL 10 MG PO CAPS: 10.0000 mg | ORAL_CAPSULE | Freq: Two times a day (BID) | ORAL | Status: DC

## 2010-10-31 NOTE — Telephone Encounter (Signed)
Sent in

## 2010-11-08 NOTE — Telephone Encounter (Signed)
Patient states [facility] called her to say that we have not responded to the refill request.  Please call in medication.

## 2010-11-20 ENCOUNTER — Telehealth: Payer: Self-pay | Admitting: Internal Medicine

## 2010-11-20 NOTE — Telephone Encounter (Signed)
RX called in to the pharmacy for # 6 pills, patient's husband aware

## 2010-11-26 ENCOUNTER — Ambulatory Visit (INDEPENDENT_AMBULATORY_CARE_PROVIDER_SITE_OTHER): Payer: Medicare Other | Admitting: *Deleted

## 2010-11-26 ENCOUNTER — Encounter: Payer: Self-pay | Admitting: Cardiovascular Disease

## 2010-11-26 ENCOUNTER — Ambulatory Visit (INDEPENDENT_AMBULATORY_CARE_PROVIDER_SITE_OTHER): Payer: Medicare Other | Admitting: Cardiovascular Disease

## 2010-11-26 DIAGNOSIS — E7800 Pure hypercholesterolemia, unspecified: Secondary | ICD-10-CM

## 2010-11-26 DIAGNOSIS — I4891 Unspecified atrial fibrillation: Secondary | ICD-10-CM

## 2010-11-26 DIAGNOSIS — I10 Essential (primary) hypertension: Secondary | ICD-10-CM

## 2010-11-26 LAB — BASIC METABOLIC PANEL
BUN: 32 mg/dL — ABNORMAL HIGH (ref 6–23)
CO2: 24 mEq/L (ref 19–32)
Calcium: 9.4 mg/dL (ref 8.4–10.5)
Chloride: 98 mEq/L (ref 96–112)
Creatinine, Ser: 1.4 mg/dL — ABNORMAL HIGH (ref 0.4–1.2)

## 2010-11-26 NOTE — Assessment & Plan Note (Signed)
Becky Forbes to be doing fairly well. She has not had any recent long episodes of atrial fibrillation. She occasionally has some palpitations. We'll continue with her current medications.

## 2010-11-26 NOTE — Progress Notes (Signed)
Becky Forbes Date of Birth  1941/04/06 [facility] [address]     [address] [place], [place]  [address] [phone]  Fax  [phone]  History of Present Illness:  Becky Forbes is a 70 year old female with a history of hypertension and atrial fibrillation.  We started HCTZ and potassium during her last visit. Her blood pressure readings at home have been well controlled. Her heart may slow. She continues to have some intermittent episodes of atrial fibrillation but these seem to be fairly well controlled.  These only last for a few minutes.   She denies any syncope or presyncope.   Current med list  Current Outpatient Prescriptions  Medication Sig Dispense Refill  . amLODipine (NORVASC) 5 MG tablet Take 1 tablet (5 mg total) by mouth daily.  90 tablet  1  . aspirin 81 MG tablet Take 81 mg by mouth daily.        . Cholecalciferol (VITAMIN D PO) Take 800 Units by mouth daily.        Marland Kitchen glimepiride (AMARYL) 1 MG tablet Take 1 tablet (1 mg total) by mouth daily before breakfast. Report any HYPOglycemia  90 tablet  1  . hydrochlorothiazide 25 MG tablet Take 25 mg by mouth daily.        . Multiple Vitamin (MULTIVITAMIN) tablet Take 1 tablet by mouth daily.        . nebivolol (BYSTOLIC) 10 MG tablet Take 10 mg by mouth as directed. 1/2-1 by mouth daily based on B/P reading      . potassium chloride (MICRO-K) 10 MEQ CR capsule Take 1 capsule (10 mEq total) by mouth daily.  30 capsule  11  . propranolol (INDERAL) 10 MG tablet Take 10 mg by mouth daily.       . ramipril (ALTACE) 10 MG capsule Take 1 capsule (10 mg total) by mouth 2 (two) times daily.  90 capsule  1  . simvastatin (ZOCOR) 20 MG tablet Take 1 tablet (20 mg total) by mouth at bedtime.  90 tablet  1  . sitaGLIPtan-metformin (JANUMET) 50-1000 MG per tablet Take 1 tablet by mouth 2 (two) times daily with a meal.  90 tablet  1      Allergies  Allergen Reactions  . Hydrocodone-Acetaminophen    REACTION: RASH,NAUSEA,ITCHING  . Morphine   . Morphine And Related Itching, Nausea And Vomiting and Rash    Past Medical History  Diagnosis Date  . Atrial fibrillation   . Diabetes mellitus   . HTN (hypertension)   . Hyperlipemia     Past Surgical History  Procedure Date  . Cataract extraction 05/25/07    RIGHT EYE  . Appendectomy   . Carpal tunnel release   . Abdominal hysterectomy   . Lumbar laminectomy   . Foot surgery 2006    MID FOOT RECONSTRUCTION  . Osteotomy 2006    AND FUSION  . Ganglion cyst excision 12/2007    Dr.Gramig  . Total knee arthroplasty 05/02/08, 07/25/08    Dr.Alusio    History  Smoking status  . Never Smoker   Smokeless tobacco  . Not on file    History  Alcohol Use No    Family History  Problem Relation Age of Onset  . Coronary artery disease Father   . Hypertension Father   . Stroke Father   . Diabetes Father   . Kidney failure Mother   . Hypertension Mother   . Other Brother  SEPSIS  . Diabetes Brother   . Diabetes Sister   . Kidney failure Sister     Reviw of Systems:  Reviewed in the HPI.  All other systems are negative.  Physical Exam: BP 146/82  Pulse 50  Ht 5\' 4"  (1.626 m)  Wt 184 lb (83.462 kg)  BMI 31.58 kg/m2 The patient is alert and oriented x 3.  The mood and affect are normal.   Skin: warm and dry.  Color is normal.    HEENT:   the sclera are nonicteric.  The mucous membranes are moist.  The carotids are 2+ without bruits.  There is no thyromegaly.  There is no JVD.    Lungs: clear.  The chest wall is non tender.    Heart: regular rate with a normal S1 and S2.  There are no murmurs, gallops, or rubs. The PMI is not displaced.     Abdomen: good bowel sounds.  There is no guarding or rebound.  There is no hepatosplenomegaly or tenderness.  There are no masses.   Extremities:  no clubbing, cyanosis, or edema.  The legs are without rashes.  The distal pulses are intact.   Neuro:  Cranial nerves II - XII are  intact.  Motor and sensory functions are intact.    The gait is normal.  ECG: Sinus brady.  No ECG changes  Assessment / Plan:

## 2010-11-26 NOTE — Assessment & Plan Note (Signed)
We added HCTZ and potassium during her last visit. Her blood pressure is much better controlled. Her blood pressure readings at home are very well controlled. We'll continue with her same medications.

## 2010-11-29 ENCOUNTER — Encounter: Payer: Self-pay | Admitting: *Deleted

## 2011-01-04 LAB — BASIC METABOLIC PANEL
CO2: 25
Calcium: 9.9
Creatinine, Ser: 0.9
GFR calc non Af Amer: >60
Glucose, Bld: 64 — ABNORMAL LOW
Sodium: 135

## 2011-01-04 LAB — CBC
Platelets: 353
RBC: 3.62 — ABNORMAL LOW
WBC: 7.9

## 2011-01-08 ENCOUNTER — Other Ambulatory Visit: Payer: Self-pay | Admitting: Internal Medicine

## 2011-01-08 ENCOUNTER — Other Ambulatory Visit (INDEPENDENT_AMBULATORY_CARE_PROVIDER_SITE_OTHER): Payer: Medicare Other

## 2011-01-08 DIAGNOSIS — I10 Essential (primary) hypertension: Secondary | ICD-10-CM

## 2011-01-08 LAB — CREATININE, SERUM: Creatinine, Ser: 2 mg/dL — ABNORMAL HIGH (ref 0.4–1.2)

## 2011-01-08 LAB — HEMOGLOBIN A1C: Hgb A1c MFr Bld: 6.9 % — ABNORMAL HIGH (ref 4.6–6.5)

## 2011-01-08 LAB — BUN: BUN: 43 mg/dL — ABNORMAL HIGH (ref 6–23)

## 2011-01-16 ENCOUNTER — Other Ambulatory Visit: Payer: Self-pay | Admitting: Internal Medicine

## 2011-01-16 ENCOUNTER — Encounter: Payer: Self-pay | Admitting: Internal Medicine

## 2011-01-16 ENCOUNTER — Ambulatory Visit (INDEPENDENT_AMBULATORY_CARE_PROVIDER_SITE_OTHER): Payer: Medicare Other | Admitting: Internal Medicine

## 2011-01-16 DIAGNOSIS — I10 Essential (primary) hypertension: Secondary | ICD-10-CM

## 2011-01-16 NOTE — Patient Instructions (Addendum)
Stop potassium & Janumet as we discussed. We'll get a second opinion  as to the optimal therapy of your diabetes. I'll ask   Your Cardiologist  to review the dual beta blocker therapy, potassium supplement  and the HCTZ in view of the new history ( isolated hypotension) and lab  Findings. Please  schedule fasting Labs : BMET in 10-14 days.  Please bring these instructions to that Lab appt.

## 2011-01-16 NOTE — Progress Notes (Signed)
Subjective:    Patient ID: Becky Forbes, female    DOB: 1940-07-18, 70 y.o.   MRN: [id number]  HPI Diabetes status assessment: Fasting or morning glucose range:  90-104 or average :  95  . Highest glucose 2 hours after any meal:  < 194, usually 135-145. Hypoglycemia : one @ bedtime , "59" due to decreased intake  .                                                     Excess thirst;hunger; urination:  no.                                  Lightheadedness with standing:  Rarely for several days 2 weeks ago. Chest pain:  no ; Palpitations :no ;  Pain in  calves with walking:  no .                                                                                                                                 Non healing skin  ulcers or sores,especially over the feet:  no. Numbness or tingling or burning in feet : no but pain from LS area to R > L  Foot; no treatment.                                                                                                                                              Significant change in  Weight : stable. Vision changes : no  .                                                                    Exercise : no due to back . Nutrition/diet:  Decreased meat. Medication compliance : yes. Medication adverse  Effects:  Low BP  2 weeks ago ( see above) . Eye exam :  3 weeks ago: laser bilaterally foe retinopathy. Foot care : no.  A1c/ urine microalbumin monitor:  A1 C has  decreased from 7.3% on 05/24 to 6.9 on 09/25. Unfortunately during this period time creatinine has risen from 1.4-2.0 and BUN from 30 to 43. Potassium is 5.8 . These values are  on Janumet  50-1000 mg twice a day.    Review of Systems   There is no trigger for the pain in the posterior legs. They can occur at rest. She denies weakness of the legs or incontinence of stool or urine.     Objective:   Physical Exam Gen.: Healthy  & well-nourished, appropriate and alert, weight Eyes: No  lid/conjunctival changes, extraocular motion intact Neck: Normal range of motion, thyroid Respiratory: No increased work of breathing or abnormal breath sounds Cardiac : regular rhythm, no extra heart sounds, gallop, murmur. Splitting S2 Abdomen: No organomegaly ,masses, bruits or aortic enlargement Lymph: No lymphadenopathy of the neck or axilla Skin: No rashes, lesions, ulcers or ischemic changes Muscle skeletal: no nail changes; joints: minimal DJD; lordosis thoracic spine. Excellent range of motion; negative straight leg raising to 90. She lay back and sat up without help. Vasc:All pulses intact, no bruits present Neuro: Normal deep tendon reflexes, alert & oriented, sensation over feet. Heel and toe walking are normal. Psych: judgment and insight, mood and affect normal         Assessment & Plan:  #1 diabetes, control improved with the present regimen but  Renal  issues contradict her present therapy. The safest regimen would be long-acting insulin such as Lantus. She is reticent to pursue this. I will request a second opinion for her. She questions increasing the glimiperide; this would have increased risk of hypoglycemia.  #2 renal insufficiency; this would negate  efficacy of HCTZ. Furosemide should be considered. She is to stop K+  until notified  #3 episodic postural hypotension. I've asked her to discuss the Bystolic  and Inderal with her cardiologist.  #4 sciatic radicular symptoms; exam suggests possible slight spinal stenosis.

## 2011-01-22 ENCOUNTER — Encounter: Payer: Self-pay | Admitting: Endocrinology

## 2011-01-22 ENCOUNTER — Ambulatory Visit (INDEPENDENT_AMBULATORY_CARE_PROVIDER_SITE_OTHER): Payer: Medicare Other | Admitting: Endocrinology

## 2011-01-22 VITALS — BP 142/72 | HR 52 | Temp 97.5°F | Ht 64.0 in | Wt 183.2 lb

## 2011-01-22 DIAGNOSIS — IMO0001 Reserved for inherently not codable concepts without codable children: Secondary | ICD-10-CM

## 2011-01-22 DIAGNOSIS — Z23 Encounter for immunization: Secondary | ICD-10-CM

## 2011-01-22 MED ORDER — PIOGLITAZONE HCL 45 MG PO TABS: 45.0000 mg | ORAL_TABLET | Freq: Every day | ORAL | Status: DC

## 2011-01-22 MED ORDER — GLIMEPIRIDE 1 MG PO TABS: 1.0000 mg | ORAL_TABLET | Freq: Every day | ORAL | Status: DC

## 2011-01-22 NOTE — Progress Notes (Signed)
Subjective:    Patient ID: Becky Forbes, female    DOB: 05-14-1940, 70 y.o.   MRN: [id number]  HPI pt states 14 years h/o dm.  she is unaware of any chronic complications being attributed to dm.  he has never been on insulin.  She went off janumet 1 week ago, due to abnormal labs.  She now takes only amaryl.  On this, pt says cbg's are 120-168.  It is in general higher as the day goes on.   pt says her diet is good, but and exercise is limited by med probs. She has many years of moderate pain at the lower back, and assoc radiation to the right foot.   Past Medical History  Diagnosis Date  . Atrial fibrillation   . Diabetes mellitus   . HTN (hypertension)   . Hyperlipemia     Past Surgical History  Procedure Date  . Cataract extraction 05/25/07    RIGHT EYE  . Appendectomy   . Carpal tunnel release   . Abdominal hysterectomy   . Lumbar laminectomy   . Foot surgery 2006    MID FOOT RECONSTRUCTION  . Osteotomy 2006    AND FUSION  . Ganglion cyst excision 12/2007    Dr.Gramig  . Total knee arthroplasty 05/02/08, 07/25/08    Dr.Alusio    History   Social History  . Marital Status: Married    Spouse Name: JAMES    Number of Children: 2  . Years of Education: N/A   Occupational History  . 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 Smoking status: Never Smoker   . Smokeless tobacco: Not on file  . Alcohol Use: No  . Drug Use: No  . Sexually Active: Not on file   Other Topics Concern  . Not on file   Social History Narrative  . No narrative on file    Current Outpatient Prescriptions on File Prior to Visit  Medication Sig Dispense Refill  . amLODipine (NORVASC) 5 MG tablet Take 1 tablet (5 mg total) by mouth daily.  90 tablet  1  . aspirin 81 MG tablet Take 81 mg by mouth daily.        . Cholecalciferol (VITAMIN D PO) Take 800 Units by mouth daily.        Marland Kitchen glimepiride (AMARYL) 1 MG tablet Take 1 mg by mouth daily before  breakfast.        . hydrochlorothiazide 25 MG tablet Take 25 mg by mouth daily.        . Multiple Vitamin (MULTIVITAMIN) tablet Take 1 tablet by mouth daily.        . nebivolol (BYSTOLIC) 10 MG tablet Take 10 mg by mouth as directed. 1/2-1 by mouth daily based on B/P reading      . propranolol (INDERAL) 10 MG tablet Take 10 mg by mouth daily.       . ramipril (ALTACE) 10 MG capsule Take 1 capsule (10 mg total) by mouth 2 (two) times daily.  90 capsule  1  . simvastatin (ZOCOR) 20 MG tablet Take 1 tablet (20 mg total) by mouth at bedtime.  90 tablet  1    Allergies  Allergen Reactions  . Morphine And Related Itching, Nausea And Vomiting and Rash    Itching & rash  . Hydrocodone-Acetaminophen     REACTION:NAUSEA    Family History  Problem Relation Age of Onset  .  Coronary artery disease Father   . Hypertension Father   . Stroke Father   . Diabetes Father   . Kidney failure Mother   . Hypertension Mother   . Other Brother     SEPSIS  . Diabetes Brother   . Diabetes Sister   . Kidney failure Sister     BP 142/72  Pulse 52  Temp(Src) 97.5 F (36.4 C) (Oral)  Ht 5\' 4"  (1.626 m)  Wt 183 lb 3.2 oz (83.099 kg)  BMI 31.45 kg/m2  SpO2 99%   Review of Systems denies weight loss, blurry vision, headache, chest pain, sob, n/v, urinary frequency, cramps, excessive diaphoresis, memory loss, depression, hypoglycemia, rhinorrhea, easy bruising, and numbness.     Objective:   Physical Exam VS: see vs page GEN: no distress HEAD: head: no deformity eyes: no periorbital swelling, no proptosis external nose and ears are normal mouth: no lesion seen NECK: supple, thyroid is not enlarged CHEST WALL: no deformity LUNGS:  Clear to auscultation CV: reg rate and rhythm, no murmur ABD: abdomen is soft, nontender.  no hepatosplenomegaly.  not distended.  no hernia. MUSCULOSKELETAL: muscle bulk and strength are grossly normal.  no obvious joint swelling.  gait is normal and  steady EXTEMITIES: no deformity.  no ulcer on the feet.  feet are of normal color and temp.  no edema PULSES: dorsalis pedis intact bilat.  no carotid bruit NEURO:  cn 2-12 grossly intact.   readily moves all 4's.  sensation is intact to touch on the feet SKIN:  Normal texture and temperature.  No rash or suspicious lesion is visible.   NODES:  None palpable at the neck PSYCH: alert, oriented x3.  Does not appear anxious nor depressed.     Lab Results  Component Value Date   HGBA1C 6.9* 01/08/2011       Assessment & Plan:  Type 2 DM.  She needs increased rx, if it can be done with a regimen that avoids or minimizes hypoglycemia. Renal insuff is a contraindication to several dm meds Radicular sxs.  This limits exercise of dm.   Atrial fibrillation.  This increases the risk to pt from any hypoglycemia.

## 2011-01-22 NOTE — Patient Instructions (Signed)
good diet and exercise habits significanly improve the control of your diabetes.  please let me know if you wish to be referred to a dietician.  high blood sugar is very risky to your health.  you should see an eye doctor every year. controlling your blood pressure and cholesterol drastically reduces the damage diabetes does to your body.  this also applies to quitting smoking.  please discuss these with your doctor.  you should take an aspirin every day, unless you have been advised by a doctor not to. check your blood sugar 1 time a day.  vary the time of day when you check, between before the 3 meals, and at bedtime.  also check if you have symptoms of your blood sugar being too high or too low.  please keep a record of the readings and bring it to your next appointment here.  please call us sooner if you are having low blood sugar episodes, or if it stays over 200. Add actos 45 mg daily. Reduce glimepiride as you are able, to keep the blood sugar in the low-100's. Please come back for a follow-up appointment in 3 months

## 2011-01-29 ENCOUNTER — Other Ambulatory Visit: Payer: Medicare Other

## 2011-01-30 ENCOUNTER — Other Ambulatory Visit (INDEPENDENT_AMBULATORY_CARE_PROVIDER_SITE_OTHER): Payer: Medicare Other

## 2011-01-30 DIAGNOSIS — I10 Essential (primary) hypertension: Secondary | ICD-10-CM

## 2011-01-30 LAB — BASIC METABOLIC PANEL
CO2: 28 mEq/L (ref 19–32)
Calcium: 9.4 mg/dL (ref 8.4–10.5)
Creatinine, Ser: 1.1 mg/dL (ref 0.4–1.2)
GFR: 51.49 mL/min — ABNORMAL LOW (ref >60.00)
Sodium: 137 mEq/L (ref 135–145)

## 2011-02-07 ENCOUNTER — Telehealth: Payer: Self-pay

## 2011-02-07 MED ORDER — SITAGLIPTIN PHOSPHATE 100 MG PO TABS: ORAL_TABLET | ORAL | Status: DC

## 2011-02-07 MED ORDER — GLIMEPIRIDE 2 MG PO TABS: 2.0000 mg | ORAL_TABLET | Freq: Every day | ORAL | Status: DC

## 2011-02-07 NOTE — Telephone Encounter (Signed)
Pt states that Dr. Alwyn Ren took her off Januvia and that she cannot take-pt is requesting an alternative medication

## 2011-02-07 NOTE — Telephone Encounter (Signed)
Pt informed of new medication and of MD's advisement.

## 2011-02-07 NOTE — Telephone Encounter (Signed)
i changed actos to [place], 1/2 of 100 mg each morning. Ret as sched.  Call sooner if blood sugar is low, or stays over 200.

## 2011-02-07 NOTE — Telephone Encounter (Signed)
Pt called stating she was to inform MD of any side effects occuring with Actos use. Pt says she has been experiencing swelling of her hands, feet and legs. Please advise.

## 2011-02-07 NOTE — Telephone Encounter (Signed)
For dm, take only glimepiride 2 mg qam Ret as scheduled

## 2011-02-14 ENCOUNTER — Encounter: Payer: Self-pay | Admitting: Internal Medicine

## 2011-03-08 ENCOUNTER — Other Ambulatory Visit: Payer: Self-pay | Admitting: Cardiovascular Disease

## 2011-03-11 ENCOUNTER — Other Ambulatory Visit: Payer: Self-pay | Admitting: Internal Medicine

## 2011-03-11 MED ORDER — RAMIPRIL 10 MG PO CAPS: 10.0000 mg | ORAL_CAPSULE | Freq: Two times a day (BID) | ORAL | Status: DC

## 2011-03-11 NOTE — Telephone Encounter (Signed)
RX faxed manually .

## 2011-03-11 NOTE — Telephone Encounter (Signed)
Fax Received. Refill Completed. Allyn Bartelson Chowoe (M.A)  

## 2011-04-17 DIAGNOSIS — IMO0002 Reserved for concepts with insufficient information to code with codable children: Secondary | ICD-10-CM | POA: Diagnosis not present

## 2011-04-17 DIAGNOSIS — M5412 Radiculopathy, cervical region: Secondary | ICD-10-CM | POA: Diagnosis not present

## 2011-04-17 DIAGNOSIS — M545 Low back pain: Secondary | ICD-10-CM | POA: Diagnosis not present

## 2011-04-17 DIAGNOSIS — M542 Cervicalgia: Secondary | ICD-10-CM | POA: Diagnosis not present

## 2011-04-23 ENCOUNTER — Other Ambulatory Visit: Payer: Self-pay | Admitting: Neurosurgery

## 2011-04-23 DIAGNOSIS — M545 Low back pain: Secondary | ICD-10-CM

## 2011-04-26 ENCOUNTER — Encounter: Payer: Self-pay | Admitting: Endocrinology

## 2011-04-26 ENCOUNTER — Ambulatory Visit (INDEPENDENT_AMBULATORY_CARE_PROVIDER_SITE_OTHER): Payer: Medicare Other | Admitting: Endocrinology

## 2011-04-26 VITALS — BP 132/78 | HR 69 | Temp 97.1°F | Ht 64.0 in | Wt 184.5 lb

## 2011-04-26 NOTE — Patient Instructions (Addendum)
Please do your a1c next week as scheduled, along with your other blood test.   Based on the results, here are some alternatives to the glimepiride:  "welchol," bromocriptine, or "onglyza" (preferred).     Please come back for a follow-up appointment in 3 months

## 2011-04-26 NOTE — Progress Notes (Signed)
Subjective:    Patient ID: Becky Forbes, female    DOB: 05/21/1940, 71 y.o.   MRN: [id number]  HPI Pt returns for f/u of type 2 DM (1998).  no cbg record, but states cbg's vary from 98-170.  It is in general higher as the day goes on Past Medical History  Diagnosis Date  . Atrial fibrillation   . Diabetes mellitus   . HTN (hypertension)   . Hyperlipemia     Past Surgical History  Procedure Date  . Cataract extraction 05/25/07    RIGHT EYE  . Appendectomy   . Carpal tunnel release   . Abdominal hysterectomy   . Lumbar laminectomy   . Foot surgery 2006    MID FOOT RECONSTRUCTION  . Osteotomy 2006    AND FUSION  . Ganglion cyst excision 12/2007    Dr.Gramig  . Total knee arthroplasty 05/02/08, 07/25/08    Dr.Alusio    History   Social History  . Marital Status: Married    Spouse Name: JAMES    Number of Children: 2  . Years of Education: N/A   Occupational History  . 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 Smoking status: Never Smoker   . Smokeless tobacco: Not on file  . Alcohol Use: No  . Drug Use: No  . Sexually Active: Not on file   Other Topics Concern  . Not on file   Social History Narrative  . No narrative on file    Current Outpatient Prescriptions on File Prior to Visit  Medication Sig Dispense Refill  . amLODipine (NORVASC) 5 MG tablet Take 1 tablet (5 mg total) by mouth daily.  90 tablet  1  . aspirin 81 MG tablet Take 81 mg by mouth daily.        Marland Kitchen BYSTOLIC 10 MG tablet TAKE ONE TABLET BY MOUTH EVERY DAY  30 each  5  . Cholecalciferol (VITAMIN D PO) Take 800 Units by mouth daily.        Marland Kitchen glimepiride (AMARYL) 2 MG tablet Take 1 tablet (2 mg total) by mouth daily before breakfast.  30 tablet  11  . hydrochlorothiazide 25 MG tablet Take 25 mg by mouth daily.        . Multiple Vitamin (MULTIVITAMIN) tablet Take 1 tablet by mouth daily.        . propranolol (INDERAL) 10 MG tablet TAKE ONE TABLET BY  MOUTH 4 TIMES DAILY AS NEEDED  30 tablet  5  . ramipril (ALTACE) 10 MG capsule Take 1 capsule (10 mg total) by mouth 2 (two) times daily.  180 capsule  2  . simvastatin (ZOCOR) 20 MG tablet Take 1 tablet (20 mg total) by mouth at bedtime.  90 tablet  1    Allergies  Allergen Reactions  . Morphine And Related Itching, Nausea And Vomiting and Rash    Itching & rash  . Actos (Pioglitazone Hydrochloride) Swelling  . Januvia (Sitagliptin Phosphate)     Pt says this caused elev LFT and creat  . Hydrocodone-Acetaminophen     REACTION:NAUSEA    Family History  Problem Relation Age of Onset  . Coronary artery disease Father   . Hypertension Father   . Stroke Father   . Diabetes Father   . Kidney failure Mother   . Hypertension Mother   . Other Brother     SEPSIS  . Diabetes Brother   .  Diabetes Sister   . Kidney failure Sister     BP 132/78  Pulse 69  Temp(Src) 97.1 F (36.2 C) (Oral)  Ht 5\' 4"  (1.626 m)  Wt 184 lb 8 oz (83.689 kg)  BMI 31.67 kg/m2  SpO2 99%  Review of Systems denies hypoglycemia.      Objective:   Physical Exam VITAL SIGNS:  See vs page GENERAL: no distress PSYCH: Alert and oriented x 3.  Does not appear anxious nor depressed.     Assessment & Plan:  DM.   She may need increased rx

## 2011-04-29 ENCOUNTER — Other Ambulatory Visit: Payer: Self-pay | Admitting: Neurosurgery

## 2011-04-29 ENCOUNTER — Ambulatory Visit
Admission: RE | Admit: 2011-04-29 | Discharge: 2011-04-29 | Disposition: A | Discharge status: Home / Self Care | Payer: Medicare Other | Source: Ambulatory Visit | Attending: Neurosurgery | Admitting: Neurosurgery

## 2011-04-29 DIAGNOSIS — M47817 Spondylosis without myelopathy or radiculopathy, lumbosacral region: Secondary | ICD-10-CM | POA: Diagnosis not present

## 2011-04-29 DIAGNOSIS — M5137 Other intervertebral disc degeneration, lumbosacral region: Secondary | ICD-10-CM | POA: Diagnosis not present

## 2011-04-29 DIAGNOSIS — M545 Low back pain: Secondary | ICD-10-CM

## 2011-04-29 DIAGNOSIS — M5126 Other intervertebral disc displacement, lumbar region: Secondary | ICD-10-CM | POA: Diagnosis not present

## 2011-05-01 ENCOUNTER — Ambulatory Visit: Payer: Medicare Other

## 2011-05-01 ENCOUNTER — Other Ambulatory Visit (INDEPENDENT_AMBULATORY_CARE_PROVIDER_SITE_OTHER): Payer: Medicare Other

## 2011-05-01 DIAGNOSIS — I10 Essential (primary) hypertension: Secondary | ICD-10-CM

## 2011-05-01 LAB — BASIC METABOLIC PANEL
BUN: 34 mg/dL — ABNORMAL HIGH (ref 6–23)
Chloride: 104 mEq/L (ref 96–112)
Creatinine, Ser: 1.5 mg/dL — ABNORMAL HIGH (ref 0.4–1.2)
GFR: 36.08 mL/min — ABNORMAL LOW (ref >60.00)
Potassium: 5.1 mEq/L (ref 3.5–5.1)

## 2011-05-01 LAB — HEMOGLOBIN A1C: Hgb A1c MFr Bld: 7.9 % — ABNORMAL HIGH (ref 4.6–6.5)

## 2011-05-07 ENCOUNTER — Encounter: Payer: Self-pay | Admitting: Internal Medicine

## 2011-05-07 ENCOUNTER — Ambulatory Visit (INDEPENDENT_AMBULATORY_CARE_PROVIDER_SITE_OTHER): Payer: Medicare Other | Admitting: Internal Medicine

## 2011-05-07 VITALS — BP 132/78 | Wt 184.0 lb

## 2011-05-07 DIAGNOSIS — Z23 Encounter for immunization: Secondary | ICD-10-CM | POA: Diagnosis not present

## 2011-05-07 NOTE — Progress Notes (Signed)
Subjective:    Patient ID: Becky Forbes, female    DOB: July 15, 1940, 71 y.o.   MRN: [id number]  HPI Diabetes status assessment: Fasting or morning glucose range:  91-150 or average :  Not calculated  . Highest glucose 2 hours after any meal:  155-250. Hypoglycemia : no .                                                     Excess thirst ; hunger; urination:  no.                                  Lightheadedness with standing: occasionally. Chest pain:  no ; Palpitations :occasional AF;  Pain in  calves with walking:  No but radiculopathy .                                                                                                                                 Non healing skin  ulcers or sores,especially over the feet: no. Numbness or tingling or burning in feet : no .                                                                                                                                              Significant change in  Weight : up 2 #. Vision changes : no  .                                                                    Exercise : minimally . Nutrition/diet:  "horrible". Medication compliance : intolerant to Actos. Medication adverse  Effects: swelling . Eye exam : 10/12; appt 1/23. Foot care : no.  A1c/ urine microalbumin monitor:  A1c 7.9% on 05/01/11. This would correlate with an average sugar of 180 an increased risk of approximately 78%.  Review of Systems     Objective:   Physical Exam She appears healthy and well-nourished; she is in no acute distress  No carotid bruits are present.  Heart rhythm and rate are normal with occasional premature  Chest is clear with no increased work of breathing  There is no evidence of aortic aneurysm or renal artery bruits  She has no clubbing or edema.   Pedal pulses are intact   No ischemic skin changes are present   Light touch is normal the feet. Pes planus present        Assessment & Plan:

## 2011-05-07 NOTE — Patient Instructions (Signed)
Eat a low-fat diet with lots of fruits and vegetables, up to 7-9 servings per day. Consume less than 30 (preferably ZERO) grams of sugar per day from foods & drinks with High Fructose Corn Syrup (HFCS) sugar as #1,2,3 or # 4 on label.Whole Foods, [employer] & [employer] do not carry products with HFCS. Follow a  low carb nutrition program such as [place] or The New Sugar Busters  to prevent Diabetes progression . White carbohydrates (potatoes, rice, bread, and pasta) have a high spike of sugar and a high load of sugar. For example a  baked potato has a cup of sugar and a  french fry  2 teaspoons of sugar. Yams, wild  rice, whole grained bread &  wheat pasta have been much lower spike and load of  sugar. Portions should be the size of a deck of cards or your palm.  Exercise should encompass 30-45 minutes 3-4 times per week. Walking does help prevent osteoporosis. If that is not  an option, water aerobics or stationary bike are alternatives.

## 2011-05-07 NOTE — Assessment & Plan Note (Signed)
Diabetes remains uncontrolled. The value of 7.9% and her postprandial glucoses indicates that a major issue here would be her diet/nutrition. The pathophysiology of insulin resistance was discussed & nutritional recommended provided.

## 2011-05-09 DIAGNOSIS — E11339 Type 2 diabetes mellitus with moderate nonproliferative diabetic retinopathy without macular edema: Secondary | ICD-10-CM | POA: Diagnosis not present

## 2011-05-09 DIAGNOSIS — E1139 Type 2 diabetes mellitus with other diabetic ophthalmic complication: Secondary | ICD-10-CM | POA: Diagnosis not present

## 2011-05-09 DIAGNOSIS — E11311 Type 2 diabetes mellitus with unspecified diabetic retinopathy with macular edema: Secondary | ICD-10-CM | POA: Diagnosis not present

## 2011-05-09 DIAGNOSIS — H43819 Vitreous degeneration, unspecified eye: Secondary | ICD-10-CM | POA: Diagnosis not present

## 2011-05-09 DIAGNOSIS — H35049 Retinal micro-aneurysms, unspecified, unspecified eye: Secondary | ICD-10-CM | POA: Diagnosis not present

## 2011-05-10 ENCOUNTER — Telehealth: Payer: Self-pay | Admitting: *Deleted

## 2011-05-10 MED ORDER — SAXAGLIPTIN HCL 5 MG PO TABS: 5.0000 mg | ORAL_TABLET | ORAL | Status: DC

## 2011-05-10 NOTE — Telephone Encounter (Signed)
Pt called regarding results of her A1c and d/c Glimepiride for another medication discussed at OV. Pt states that she is willing to take whatever alternative medication MD feels is best for her.

## 2011-05-10 NOTE — Telephone Encounter (Signed)
Pt informed of new medication, rx sent to [facility].

## 2011-05-10 NOTE — Telephone Encounter (Signed)
Good Change glimepiride to "onglyza."  i sent rx

## 2011-05-15 ENCOUNTER — Other Ambulatory Visit: Payer: Self-pay | Admitting: Neurosurgery

## 2011-05-15 DIAGNOSIS — M542 Cervicalgia: Secondary | ICD-10-CM | POA: Diagnosis not present

## 2011-05-15 DIAGNOSIS — M545 Low back pain: Secondary | ICD-10-CM | POA: Diagnosis not present

## 2011-05-27 ENCOUNTER — Telehealth: Payer: Self-pay

## 2011-05-27 ENCOUNTER — Telehealth: Payer: Self-pay | Admitting: *Deleted

## 2011-05-27 ENCOUNTER — Ambulatory Visit (INDEPENDENT_AMBULATORY_CARE_PROVIDER_SITE_OTHER): Payer: Medicare Other | Admitting: Cardiovascular Disease

## 2011-05-27 ENCOUNTER — Encounter: Payer: Self-pay | Admitting: Cardiovascular Disease

## 2011-05-27 DIAGNOSIS — I10 Essential (primary) hypertension: Secondary | ICD-10-CM | POA: Diagnosis not present

## 2011-05-27 DIAGNOSIS — I4891 Unspecified atrial fibrillation: Secondary | ICD-10-CM

## 2011-05-27 DIAGNOSIS — E782 Mixed hyperlipidemia: Secondary | ICD-10-CM

## 2011-05-27 LAB — BASIC METABOLIC PANEL
CO2: 24 mEq/L (ref 19–32)
Chloride: 105 mEq/L (ref 96–112)
Glucose, Bld: 118 mg/dL — ABNORMAL HIGH (ref 70–99)
Sodium: 137 mEq/L (ref 135–145)

## 2011-05-27 LAB — HEPATIC FUNCTION PANEL
AST: 23 U/L (ref 0–37)
Albumin: 3.9 g/dL (ref 3.5–5.2)
Alkaline Phosphatase: 116 U/L (ref 39–117)
Total Bilirubin: 0.8 mg/dL (ref 0.3–1.2)

## 2011-05-27 LAB — LIPID PANEL
HDL: 39.4 mg/dL (ref >39.00)
LDL Cholesterol: 67 mg/dL (ref 0–99)
Total CHOL/HDL Ratio: 3
Triglycerides: 102 mg/dL (ref 0.0–149.0)

## 2011-05-27 MED ORDER — HYDROCHLOROTHIAZIDE 25 MG PO TABS: 25.0000 mg | ORAL_TABLET | Freq: Every day | ORAL | Status: DC

## 2011-05-27 MED ORDER — PROPRANOLOL HCL 10 MG PO TABS: 10.0000 mg | ORAL_TABLET | Freq: Four times a day (QID) | ORAL | Status: DC | PRN

## 2011-05-27 MED ORDER — NEBIVOLOL HCL 10 MG PO TABS: 10.0000 mg | ORAL_TABLET | Freq: Every day | ORAL | Status: DC

## 2011-05-27 NOTE — Progress Notes (Signed)
Becky Forbes Date of Birth  [birth year] [facility]     [place] [facility]  [address]    [address]   [address] [place], [place]  [address]    [place], [place]  [address] [phone]  Fax  [phone]  [phone]  Fax [phone]  Problem list: 1. Atrial fibrillation 2. Diabetes mellitus 3. Hypertension 4. Hyperlipidemia  History of Present Illness:  Her BP has been quite variable.  A typical BP is 126/70.  It has been as low as 88/40.   She is going to have back surgery  - L-3 through S1 decompression on Feb. 26.  [name] not had any episodes of chest pain or shortness of breath.  Current Outpatient Prescriptions on File Prior to Visit  Medication Sig Dispense Refill  . amLODipine (NORVASC) 5 MG tablet Take 1 tablet (5 mg total) by mouth daily.  90 tablet  1  . aspirin 81 MG tablet Take 81 mg by mouth daily.        Marland Kitchen BYSTOLIC 10 MG tablet TAKE ONE TABLET BY MOUTH EVERY DAY  30 each  5  . Cholecalciferol (VITAMIN D PO) Take 800 Units by mouth daily.        . hydrochlorothiazide 25 MG tablet Take 25 mg by mouth daily.        . Multiple Vitamin (MULTIVITAMIN) tablet Take 1 tablet by mouth daily.        . propranolol (INDERAL) 10 MG tablet TAKE ONE TABLET BY MOUTH 4 TIMES DAILY AS NEEDED  30 tablet  5  . ramipril (ALTACE) 10 MG capsule Take 1 capsule (10 mg total) by mouth 2 (two) times daily.  180 capsule  2  . saxagliptin HCl (ONGLYZA) 5 MG TABS tablet Take 1 tablet (5 mg total) by mouth every morning.  30 tablet  11  . simvastatin (ZOCOR) 20 MG tablet Take 1 tablet (20 mg total) by mouth at bedtime.  90 tablet  1    Allergies  Allergen Reactions  . Morphine And Related Itching, Nausea And Vomiting and Rash    Itching & rash  . Actos (Pioglitazone Hydrochloride) Swelling  . Januvia (Sitagliptin Phosphate)     Pt says this caused elev LFT and creat  . Hydrocodone-Acetaminophen     REACTION:NAUSEA    Past Medical History  Diagnosis Date  . Atrial  fibrillation   . Diabetes mellitus   . HTN (hypertension)   . Hyperlipemia     Past Surgical History  Procedure Date  . Cataract extraction 05/25/07    RIGHT EYE  . Appendectomy   . Carpal tunnel release   . Abdominal hysterectomy   . Lumbar laminectomy   . Foot surgery 2006    MID FOOT RECONSTRUCTION  . Osteotomy 2006    AND FUSION  . Ganglion cyst excision 12/2007    Dr.Gramig  . Total knee arthroplasty 05/02/08, 07/25/08    Dr.Alusio    History  Smoking status  . Never Smoker   Smokeless tobacco  . Not on file    History  Alcohol Use No    Family History  Problem Relation Age of Onset  . Coronary artery disease Father   . Hypertension Father   . Stroke Father   . Diabetes Father   . Kidney failure Mother   . Hypertension Mother   . Other Brother     SEPSIS  . Diabetes Brother   . Diabetes Sister   . Kidney  failure Sister     Reviw of Systems:  Reviewed in the HPI.  All other systems are negative.  Physical Exam: Blood pressure 173/69, pulse 55, height 5\' 4"  (1.626 m), weight 185 lb 1.9 oz (83.97 kg). General: Well developed, well nourished, in no acute distress.  Head: Normocephalic, atraumatic, sclera non-icteric, mucus membranes are moist,   Neck: Supple. Negative for carotid bruits. JVD not elevated.  Lungs: Clear bilaterally to auscultation without wheezes, rales, or rhonchi. Breathing is unlabored.  Heart: RRR with S1 S2. No murmurs, rubs, or gallops appreciated.  Abdomen: Soft, non-tender, non-distended with normoactive bowel sounds. No hepatomegaly. No rebound/guarding. No obvious abdominal masses.  Msk:  Strength and tone appear normal for age.  Extremities: No clubbing or cyanosis. No edema.  Distal pedal pulses are 2+ and equal bilaterally.  Neuro: Alert and oriented X 3. Moves all extremities spontaneously.  Psych:  Responds to questions appropriately with a normal affect.  ECG: Sinus brady.  No St or T abn.  Assessment / Plan:

## 2011-05-27 NOTE — Assessment & Plan Note (Signed)
Her blood pressure readings at home have typically been well controlled.  [name] scheduled have back surgery later this month. She is at low risk for any cardiovascular consultations during her back surgery.

## 2011-05-27 NOTE — Assessment & Plan Note (Signed)
[name] currently on simvastatin 20 mg a day as well as amlodipine 5 mgtoday. I've told her that I prefer that she be on Lipitor. We will continue with her current dose of simvastatin since that is an acceptable dose. We will discuss changing her to atorvastatin at a later time. [name] not having any side effects. Will check fasting lipids today.

## 2011-05-27 NOTE — Patient Instructions (Addendum)
Your physician recommends that you return for a FASTING lipid profile: TODAY AND IN 6 MONTHS  Your physician wants you to follow-up in: 6 MONTHS  You will receive a reminder letter in the mail two months in advance. If you don't receive a letter, please call our office to schedule the follow-up appointment.   

## 2011-05-27 NOTE — Telephone Encounter (Signed)
Pt called to inform MD that she is scheduled for surgery Feb 26 with Dr Venetia Maxon for L3-L4,  L4-L5, and  L5-F1 decompression with fusion and rods and screws.

## 2011-05-27 NOTE — Telephone Encounter (Signed)
Pt would like to be cleared for back surgery. Pt noted that she was just in office 05-07-11.Marland KitchenPlease advise

## 2011-05-28 ENCOUNTER — Encounter (HOSPITAL_COMMUNITY): Payer: Self-pay | Admitting: Pharmacy Technician

## 2011-05-28 NOTE — Telephone Encounter (Signed)
Left message to call office

## 2011-05-28 NOTE — Telephone Encounter (Signed)
Surgical clearance should be provided by her cardiologist Dr. Melburn Popper.She is cleared medically based on the 05/07/11 evaluation .

## 2011-05-29 MED ORDER — EPOETIN ALFA 20000 UNIT/ML IJ SOLN
INTRAMUSCULAR | Status: AC
  Filled 2011-05-29: qty 1

## 2011-05-30 ENCOUNTER — Telehealth: Payer: Self-pay | Admitting: Internal Medicine

## 2011-05-30 MED ORDER — AMLODIPINE BESYLATE 5 MG PO TABS: 5.0000 mg | ORAL_TABLET | Freq: Every day | ORAL | Status: DC

## 2011-05-30 MED ORDER — SIMVASTATIN 20 MG PO TABS: 20.0000 mg | ORAL_TABLET | Freq: Every day | ORAL | Status: DC

## 2011-05-30 NOTE — Telephone Encounter (Signed)
rx sent in electronically 

## 2011-05-30 NOTE — Telephone Encounter (Signed)
Refill- zocor 20mg  tab. Take one tablet by mouth at bedtime. Qty 90 last fill 11.23.12  Refill- norvasc 5mg  tab. Take one tablet by mouth daily. Qty 90 last fill 11.23.12

## 2011-06-03 ENCOUNTER — Telehealth: Payer: Self-pay | Admitting: Cardiovascular Disease

## 2011-06-03 ENCOUNTER — Encounter (HOSPITAL_COMMUNITY)
Admission: RE | Admit: 2011-06-03 | Discharge: 2011-06-03 | Disposition: A | Discharge status: Home / Self Care | Payer: Medicare Other | Source: Ambulatory Visit | Attending: Neurosurgery | Admitting: Neurosurgery

## 2011-06-03 ENCOUNTER — Encounter (HOSPITAL_COMMUNITY)
Admission: RE | Admit: 2011-06-03 | Discharge: 2011-06-03 | Disposition: A | Discharge status: Home / Self Care | Payer: Medicare Other | Source: Ambulatory Visit | Attending: Anesthesiology | Admitting: Anesthesiology

## 2011-06-03 ENCOUNTER — Encounter (HOSPITAL_COMMUNITY): Payer: Self-pay

## 2011-06-03 DIAGNOSIS — Z01811 Encounter for preprocedural respiratory examination: Secondary | ICD-10-CM | POA: Diagnosis not present

## 2011-06-03 HISTORY — DX: Other specified postprocedural states: Z98.890

## 2011-06-03 HISTORY — DX: Other specified postprocedural states: R11.2

## 2011-06-03 LAB — BASIC METABOLIC PANEL
Chloride: 103 mEq/L (ref 96–112)
GFR calc Af Amer: 35 mL/min — ABNORMAL LOW (ref >90)
GFR calc non Af Amer: 30 mL/min — ABNORMAL LOW (ref >90)
Potassium: 5.2 mEq/L — ABNORMAL HIGH (ref 3.5–5.1)
Sodium: 139 mEq/L (ref 135–145)

## 2011-06-03 LAB — CBC
HCT: 29.7 % — ABNORMAL LOW (ref 36.0–46.0)
Hemoglobin: 9.8 g/dL — ABNORMAL LOW (ref 12.0–15.0)
RBC: 3.22 MIL/uL — ABNORMAL LOW (ref 3.87–5.11)
WBC: 11.5 10*3/uL — ABNORMAL HIGH (ref 4.0–10.5)

## 2011-06-03 LAB — SURGICAL PCR SCREEN: MRSA, PCR: NEGATIVE

## 2011-06-03 NOTE — Telephone Encounter (Signed)
Pt was notified.  

## 2011-06-03 NOTE — Progress Notes (Signed)
Anesthesia please see labs. 

## 2011-06-03 NOTE — Pre-Procedure Instructions (Signed)
19 Becky Forbes  06/03/2011   Your procedure is scheduled on:  FEB 26  Report to [facility] at 0830 AM.  Call this number if you have problems the morning of surgery: [phone]   Remember:   Do not eat food:After Midnight.  May have clear liquids: up to 4 Hours before arrival.  Clear liquids include soda, tea, black coffee, apple or grape juice, broth.  Take these medicines the morning of surgery with A SIP OF WATER: AMLODIPINE BYSTOLOC.CHECK WITH DR Elease Hashimoto RE D/C ASPIRIN.STOP HERBAL MEDICATION   Do not wear jewelry, make-up or nail polish.  Do not wear lotions, powders, or perfumes. You may wear deodorant.  Do not shave 48 hours prior to surgery.  Do not bring valuables to the hospital.  Contacts, dentures or bridgework may not be worn into surgery.  Leave suitcase in the car. After surgery it may be brought to your room.  For patients admitted to the hospital, checkout time is 11:00 AM the day of discharge.   Patients discharged the day of surgery will not be allowed to drive home.  Name and phone number of your driver: SPOUSE  Special Instructions: CHG Shower Use Special Wash: 1/2 bottle night before surgery and 1/2 bottle morning of surgery.   Please read over the following fact sheets that you were given: Coughing and Deep Breathing, Blood Transfusion Information, MRSA Information and Surgical Site Infection Prevention

## 2011-06-03 NOTE — Telephone Encounter (Signed)
Patient is having back surgery next Tuesday. She would like to know if she needs to stop take the Aspirin 81 mg daily prior the surgery. The surgeon does not care ether way , but would like to have Dr. Harvie Bridge opinion.

## 2011-06-03 NOTE — Telephone Encounter (Signed)
That would be entirely up the the surgeon.

## 2011-06-03 NOTE — Telephone Encounter (Signed)
New msg: pt calling wanting to know if MD wants her to continue taking baby aspirin prior to pt  Back surgery 06/11/11.  Please return pt call to discuss further.

## 2011-06-04 NOTE — Consult Note (Signed)
Anesthesia:  Patient is a 71 year old female scheduled for a L3-T1 PLIF on 06/11/11.  History includes HTN on Beta blocker therapy, PAF, HLD, DM2, non-smoker, multiple surgical procedures.  Her PCP is Dr. Marga Melnick.  Endocrinologist is Dr. Romero Belling.  Her Cardiologist is Dr. Kristeen Miss. She was last seen in August 2023.  He is aware of her upcoming surgery.  EKG from 05/27/11 shows SB.  Echo from 05/26/07 showed normal LV function, EF 55-60%, mild LAE, mild aortic sclerosis, mild MR/TR, mod pulmonary HTN.  CXR shows no active disease.  Labs noted.  K+ 5.2. BUN/Cr 46/1.66, which overall appears stable (labs since September 2012 show BUN 24-44 and Cr 1.5-2.0).  H/H 9.8/29.7. Currently her only CBC comparison labs are from 2010 during her admission for a left TKA (pre-op Hgb was 11.2 then).  I'll add a crossmatch to the T&S already done in the lab.

## 2011-06-07 NOTE — Telephone Encounter (Signed)
Discuss with patient  

## 2011-06-10 MED ORDER — CEFAZOLIN SODIUM-DEXTROSE 2-3 GM-% IV SOLR
2.0000 g | INTRAVENOUS | Status: AC
  Administered 2011-06-11: 2 g via INTRAVENOUS
  Filled 2011-06-10: qty 50

## 2011-06-10 NOTE — H&P (Signed)
Becky Forbes. Sterkel  #[id number]  DOB:  10-Jan-1941 05/15/2011:     Becky Forbes returns today to review her MRI of her lumbar spine, including lumbar flexion and extension radiographs which demonstrate spondylolisthesis of L3 on L4, L4 on L5, and L5 on S1.  She has evidence on her MRI of a Grade I slip of L3 on L4 with moderate spinal stenosis and left lateral recess encroachment with foraminal stenosis bilaterally, with severe disc degeneration at L4-5, with prior laminectomy on the left, with moderate foraminal encroachment bilaterally.  She has a Grade I slip of L5 on S1, with marked left foraminal encroachment with impingement on the left L5 nerve root, with mild to moderate right foraminal stenosis.   Her radiographs show 12.4 mm. of spondylolisthesis of L3 on L4 in flexion, which decreases to 6 mm. on extension, 3 mm. of spondylolisthesis of L4 on L5, and 6 mm. of spondylolisthesis of L5 on S1.    She has severe pain, is extremely limited and very frustrated with her level of pain.  I suggested surgery and given the severity of her pain, she wishes to go ahead with this.  We plan on L3-4, L4-5, and L5-S1 decompression and fusion operation, and she will be fitted for an LSO brace.  The exact surgical plan was discussed in detail with the patient and her husband. She does wish to proceed.  Risks and benefits were also discussed.           Danae Orleans. Venetia Maxon, M.D./aft  cc: Dr. Marga Melnick     NEUROSURGICAL CONSULTATION  Becky Forbes. Bagot  #[id number]  DOB:  [birth year]  April 17, 2011  HISTORY:     Becky Forbes is a 71 year old retired Engineer, civil (consulting) with low back pain and neck pain.  She currently describes low back pain rating up to 4-5 up to 10/10, right greater than left leg pain 10/10 and neck pain ranging from 3-4 up to 7-8/10. She notes rare numbness.  She says she has weakness in the right greater than left leg.  She notes this has been going on for greater than 9 months but has increased in the last two months. She says  she has increased pain with standing for more than 10 minutes and also has pain in the low back, right leg into her toes.  She has been using Aleve over-the-counter on a rare basis.  She has a history of atrial fibrillation after cataract surgery.  She has a history of hypertension and non-insulin dependent diabetes.  She has had two prior lumbar surgeries one occurred 15 years ago at L4-5 and L5-S1 levels by Dr. Hope Pigeon, the second was performed by Dr. Elesa Hacker.  Both were for herniated lumbar discs.   20 years ago she had right carpal tunnel release.  She has had left foot reconstruction for a stress fracture and has had bilateral total knee replacements.  She has had cataract surgery six years ago.    REVIEW OF SYSTEMS:   A detailed Review of Systems sheet was reviewed with the patient.  Pertinent positives include glasses to read.    PAST MEDICAL HISTORY:      Current Medical Conditions:    As previously described.      Prior Operations and Hospitalizations:   As previously described.      Medications and Allergies:  SHE IS ALLERGIC TO MORPHINE AND VICODIN.  She is taking Ramipril 10 mg. b.i.d., Norvasc 5 mg. q.d., Bystolic 10 mg. q.d., Glimepiride 2  mg. q.d., Simvastatin 20 mg. q.d., Multivitamins q.d., Calcium 600 mg. q.d., Vitamin D 10,000 mg. q.d., and Propranolol 10 mg.     Height and Weight:     5'4" tall, 182 pounds giving her a BMI of 31.2.   FAMILY HISTORY:    Both parents are deceased. Mother with kidney failure at 72 and father died at age 72 of a stroke and heart attack.       DIAGNOSTIC STUDIES:   We obtained cervical radiographs which showed degenerative disc disease at C3-4, C4-5, C5-6 and C6-7 levels, foraminal stenosis on the right at C3-4, C5-6 and C6-7 levels with anterolisthesis at C3 on C4 and facet arthropathy.  The lumbar spine radiographs demonstrate L3-4, and L4-5 scoliosis most marked at the L3-4 level with spondylolisthesis of L3-4, L4-5 and L5-S1 levels.  She has  dextroconvexed scoliosis centered at the L3-4 level.      PHYSICAL EXAMINATION:      General Appearance:   On examination today, Becky Forbes is a pleasant and cooperative woman in no acute distress.        Blood Pressure, Pulse, Respirations:   138/86, heart rate 74 and irregular, respiratory rate is 18.      HEENT - normocephalic, atraumatic.  The pupils are equal, round and reactive to light.  The extraocular muscles are intact.  Sclerae - white.  Conjunctiva - pink.  Oropharynx benign.  Uvula midline.     Neck - there are no masses, meningismus, deformities, tracheal deviation, jugular vein distention or carotid bruits.  There is normal cervical range of motion.  Spurlings' test is negative without reproducible radicular pain turning the patient's head to either side.  Lhermitte's sign is not present with axial compression.      Respiratory - there is normal respiratory effort with good intercostal function.  Lungs are clear to auscultation.  There are no rales, rhonchi or wheezes.      Cardiovascular -the heart has an irregular heart rate.  No murmurs are appreciated.  There is no extremity edema, cyanosis or clubbing.  There are palpable pedal pulses.      Abdomen - soft, nontender, no hepatosplenomegaly appreciated or masses.  There are active bowel sounds.  No guarding or rebound.      Musculoskeletal Examination - she has pain at the lumbosacral junction and right greater than left sciatic notch discomfort.  She is able to bend to touch her toes and able to stand on her heels and toes.  She walks with an antalgic gait favoring the right lower extremity.  She had good range of motion of the cervical spine with negative Spurlings' maneuver to either side.  Straight leg raise causes low back pain right greater than left.    NEUROLOGICAL EXAMINATION: The patient is oriented to time, person and place and has good recall of both recent and remote memory with normal attention span and  concentration.  The patient speaks with clear and fluent speech and exhibits normal language function and appropriate fund of knowledge.      Cranial Nerve Examination - pupils are equal, round and reactive to light.  Extraocular movements are full.  Visual fields are full to confrontational testing.  Facial sensation and facial movement are symmetric and intact.  Hearing is intact to finger rub.  Palate is upgoing.  Shoulder shrug is symmetric.  Tongue protrudes in the midline.      Motor Examination - motor strength is 5/5 in the bilateral deltoids, biceps, triceps,  handgrips, wrist extensors, interosseous.  In the lower extremities motor strength is 5/5 in hip flexion, extension, quadriceps, hamstrings, plantar flexion, dorsiflexion and extensor hallucis longus.      Sensory Examination - normal to light touch and pinprick sensation in the upper and lower extremities.     Deep Tendon Reflexes - 2 in the biceps, triceps, and brachioradialis, 2 in the knees, 2 in the ankles.  The great toes are downgoing to plantar stimulation.  No pathologic reflexes.       Cerebellar Examination - normal coordination in upper and lower extremities and normal rapid alternating movements.  Romberg test is negative.    IMPRESSION AND RECOMMENDATIONS: Becky Forbes is a 71 year old woman who has multilevel degenerative changes at the lumbar spine. She also has multilevel cervical spondylosis.  I do not detect cervical spondylosis.  I do not detect cervical myelopathy or radiculopathy on examination.  She does appear to have lumbar radiculopathy.  I recommended an MRI of the lumbar spine with and without Gadolinium and will make further recommendations after those have been performed.  We will also get flexion and extension views of the lumbar and cervical spine to evaluate whether the spondylolistheses that were documented on initial static images are mobile.  I will make further recommendations after those studies have  been done.    [facility]    Danae Orleans. Venetia Maxon, M.D.  JDS:gde  cc: Dr. Marga Melnick

## 2011-06-11 ENCOUNTER — Encounter (HOSPITAL_COMMUNITY): Payer: Self-pay | Admitting: Vascular Surgery

## 2011-06-11 ENCOUNTER — Inpatient Hospital Stay (HOSPITAL_COMMUNITY): Payer: Medicare Other | Admitting: Vascular Surgery

## 2011-06-11 ENCOUNTER — Inpatient Hospital Stay (HOSPITAL_COMMUNITY): Payer: Medicare Other

## 2011-06-11 ENCOUNTER — Encounter (HOSPITAL_COMMUNITY)
Admission: RE | Disposition: A | Discharge status: Home / Self Care | Payer: Self-pay | Source: Ambulatory Visit | Attending: Neurosurgery

## 2011-06-11 ENCOUNTER — Inpatient Hospital Stay (HOSPITAL_COMMUNITY)
Admission: RE | Admit: 2011-06-11 | Discharge: 2011-06-15 | DRG: 458 | Disposition: A | Discharge status: Home / Self Care | Payer: Medicare Other | Source: Ambulatory Visit | Attending: Neurosurgery | Admitting: Neurosurgery

## 2011-06-11 ENCOUNTER — Encounter (HOSPITAL_COMMUNITY): Payer: Self-pay | Admitting: *Deleted

## 2011-06-11 DIAGNOSIS — Z7982 Long term (current) use of aspirin: Secondary | ICD-10-CM | POA: Diagnosis not present

## 2011-06-11 DIAGNOSIS — E782 Mixed hyperlipidemia: Secondary | ICD-10-CM

## 2011-06-11 DIAGNOSIS — M519 Unspecified thoracic, thoracolumbar and lumbosacral intervertebral disc disorder: Secondary | ICD-10-CM | POA: Diagnosis not present

## 2011-06-11 DIAGNOSIS — Z8249 Family history of ischemic heart disease and other diseases of the circulatory system: Secondary | ICD-10-CM | POA: Diagnosis not present

## 2011-06-11 DIAGNOSIS — M47816 Spondylosis without myelopathy or radiculopathy, lumbar region: Secondary | ICD-10-CM

## 2011-06-11 DIAGNOSIS — I10 Essential (primary) hypertension: Secondary | ICD-10-CM | POA: Diagnosis present

## 2011-06-11 DIAGNOSIS — M4120 Other idiopathic scoliosis, site unspecified: Secondary | ICD-10-CM | POA: Diagnosis not present

## 2011-06-11 DIAGNOSIS — IMO0002 Reserved for concepts with insufficient information to code with codable children: Secondary | ICD-10-CM | POA: Diagnosis not present

## 2011-06-11 DIAGNOSIS — E119 Type 2 diabetes mellitus without complications: Secondary | ICD-10-CM | POA: Diagnosis present

## 2011-06-11 DIAGNOSIS — Z96659 Presence of unspecified artificial knee joint: Secondary | ICD-10-CM | POA: Diagnosis not present

## 2011-06-11 DIAGNOSIS — M949 Disorder of cartilage, unspecified: Secondary | ICD-10-CM | POA: Diagnosis present

## 2011-06-11 DIAGNOSIS — Z886 Allergy status to analgesic agent status: Secondary | ICD-10-CM

## 2011-06-11 DIAGNOSIS — M899 Disorder of bone, unspecified: Secondary | ICD-10-CM | POA: Diagnosis present

## 2011-06-11 DIAGNOSIS — I4891 Unspecified atrial fibrillation: Secondary | ICD-10-CM | POA: Diagnosis present

## 2011-06-11 DIAGNOSIS — M5126 Other intervertebral disc displacement, lumbar region: Secondary | ICD-10-CM | POA: Diagnosis present

## 2011-06-11 DIAGNOSIS — Z823 Family history of stroke: Secondary | ICD-10-CM | POA: Diagnosis not present

## 2011-06-11 DIAGNOSIS — Z981 Arthrodesis status: Secondary | ICD-10-CM | POA: Diagnosis not present

## 2011-06-11 DIAGNOSIS — M4310 Spondylolisthesis, site unspecified: Secondary | ICD-10-CM | POA: Diagnosis not present

## 2011-06-11 DIAGNOSIS — M48061 Spinal stenosis, lumbar region without neurogenic claudication: Secondary | ICD-10-CM | POA: Diagnosis present

## 2011-06-11 DIAGNOSIS — M545 Low back pain: Secondary | ICD-10-CM | POA: Diagnosis not present

## 2011-06-11 LAB — GLUCOSE, CAPILLARY: Glucose-Capillary: 163 mg/dL — ABNORMAL HIGH (ref 70–99)

## 2011-06-11 LAB — CBC
HCT: 23.1 % — ABNORMAL LOW (ref 36.0–46.0)
MCV: 92.8 fL (ref 78.0–100.0)
Platelets: 212 10*3/uL (ref 150–400)
RBC: 2.49 MIL/uL — ABNORMAL LOW (ref 3.87–5.11)
RDW: 13.8 % (ref 11.5–15.5)
WBC: 9.2 10*3/uL (ref 4.0–10.5)

## 2011-06-11 SURGERY — POSTERIOR LUMBAR FUSION 3 LEVEL
Anesthesia: General | Site: Spine Lumbar | Laterality: Bilateral | Wound class: Clean

## 2011-06-11 MED ORDER — LIDOCAINE-EPINEPHRINE 1 %-1:100000 IJ SOLN
INTRAMUSCULAR | Status: DC | PRN
  Administered 2011-06-11: 5 mL

## 2011-06-11 MED ORDER — HYDROMORPHONE HCL PF 1 MG/ML IJ SOLN
0.2500 mg | INTRAMUSCULAR | Status: DC | PRN
  Administered 2011-06-11 (×4): 0.25 mg via INTRAVENOUS

## 2011-06-11 MED ORDER — ASPIRIN EC 81 MG PO TBEC
81.0000 mg | DELAYED_RELEASE_TABLET | Freq: Every day | ORAL | Status: DC
  Administered 2011-06-11 – 2011-06-15 (×5): 81 mg via ORAL
  Filled 2011-06-11 (×5): qty 1

## 2011-06-11 MED ORDER — ACETAMINOPHEN 650 MG RE SUPP: 650.0000 mg | RECTAL | Status: DC | PRN

## 2011-06-11 MED ORDER — MORPHINE SULFATE (PF) 1 MG/ML IV SOLN: INTRAVENOUS | Status: DC

## 2011-06-11 MED ORDER — LINAGLIPTIN 5 MG PO TABS
5.0000 mg | ORAL_TABLET | Freq: Every day | ORAL | Status: DC
  Administered 2011-06-11 – 2011-06-15 (×5): 5 mg via ORAL
  Filled 2011-06-11 (×5): qty 1

## 2011-06-11 MED ORDER — BUPIVACAINE HCL (PF) 0.5 % IJ SOLN
INTRAMUSCULAR | Status: DC | PRN
  Administered 2011-06-11: 5 mL

## 2011-06-11 MED ORDER — SODIUM CHLORIDE 0.9 % IR SOLN
Status: DC | PRN
  Administered 2011-06-11: 12:00:00

## 2011-06-11 MED ORDER — INSULIN ASPART 100 UNIT/ML ~~LOC~~ SOLN
0.0000 [IU] | Freq: Every day | SUBCUTANEOUS | Status: DC
  Administered 2011-06-11: 2 [IU] via SUBCUTANEOUS
  Filled 2011-06-11 (×2): qty 3

## 2011-06-11 MED ORDER — PHENOL 1.4 % MT LIQD: 1.0000 | OROMUCOSAL | Status: DC | PRN

## 2011-06-11 MED ORDER — NEOSTIGMINE METHYLSULFATE 1 MG/ML IJ SOLN
INTRAMUSCULAR | Status: DC | PRN
  Administered 2011-06-11: 4 mg via INTRAVENOUS

## 2011-06-11 MED ORDER — THROMBIN 20000 UNITS EX KIT
PACK | CUTANEOUS | Status: DC | PRN
  Administered 2011-06-11: 12:00:00 via TOPICAL

## 2011-06-11 MED ORDER — BACITRACIN 50000 UNITS IM SOLR
INTRAMUSCULAR | Status: AC
  Filled 2011-06-11: qty 1

## 2011-06-11 MED ORDER — DIPHENHYDRAMINE HCL 12.5 MG/5ML PO ELIX
12.5000 mg | ORAL_SOLUTION | Freq: Four times a day (QID) | ORAL | Status: DC | PRN
  Filled 2011-06-11: qty 5

## 2011-06-11 MED ORDER — SODIUM CHLORIDE 0.9 % IJ SOLN: 9.0000 mL | INTRAMUSCULAR | Status: DC | PRN

## 2011-06-11 MED ORDER — HYDROCHLOROTHIAZIDE 25 MG PO TABS
25.0000 mg | ORAL_TABLET | Freq: Every day | ORAL | Status: DC
  Administered 2011-06-12 – 2011-06-15 (×2): 25 mg via ORAL
  Filled 2011-06-11 (×5): qty 1

## 2011-06-11 MED ORDER — NALOXONE HCL 0.4 MG/ML IJ SOLN: 0.4000 mg | INTRAMUSCULAR | Status: DC | PRN

## 2011-06-11 MED ORDER — FUROSEMIDE 10 MG/ML IJ SOLN
INTRAMUSCULAR | Status: DC | PRN
  Administered 2011-06-11: 10 mg via INTRAMUSCULAR

## 2011-06-11 MED ORDER — SODIUM CHLORIDE 0.9 % IV SOLN
INTRAVENOUS | Status: AC
  Filled 2011-06-11: qty 500

## 2011-06-11 MED ORDER — LACTATED RINGERS IV SOLN
INTRAVENOUS | Status: DC | PRN
  Administered 2011-06-11 (×2): via INTRAVENOUS

## 2011-06-11 MED ORDER — PROPOFOL 10 MG/ML IV EMUL
INTRAVENOUS | Status: DC | PRN
  Administered 2011-06-11: 150 mg via INTRAVENOUS

## 2011-06-11 MED ORDER — GLYCOPYRROLATE 0.2 MG/ML IJ SOLN
INTRAMUSCULAR | Status: DC | PRN
  Administered 2011-06-11: .7 mg via INTRAVENOUS
  Administered 2011-06-11 (×2): 0.1 mg via INTRAVENOUS

## 2011-06-11 MED ORDER — ADULT MULTIVITAMIN W/MINERALS CH
1.0000 | ORAL_TABLET | Freq: Every day | ORAL | Status: DC
  Administered 2011-06-12 – 2011-06-15 (×4): 1 via ORAL
  Filled 2011-06-11 (×4): qty 1

## 2011-06-11 MED ORDER — METOCLOPRAMIDE HCL 5 MG/ML IJ SOLN
INTRAMUSCULAR | Status: DC | PRN
  Administered 2011-06-11: 10 mg via INTRAVENOUS

## 2011-06-11 MED ORDER — SODIUM CHLORIDE 0.9 % IJ SOLN: 3.0000 mL | INTRAMUSCULAR | Status: DC | PRN

## 2011-06-11 MED ORDER — DIPHENHYDRAMINE HCL 12.5 MG/5ML PO ELIX: 12.5000 mg | ORAL_SOLUTION | Freq: Four times a day (QID) | ORAL | Status: DC | PRN

## 2011-06-11 MED ORDER — ROCURONIUM BROMIDE 100 MG/10ML IV SOLN
INTRAVENOUS | Status: DC | PRN
  Administered 2011-06-11: 20 mg via INTRAVENOUS
  Administered 2011-06-11: 10 mg via INTRAVENOUS
  Administered 2011-06-11: 40 mg via INTRAVENOUS
  Administered 2011-06-11: 10 mg via INTRAVENOUS

## 2011-06-11 MED ORDER — ONDANSETRON HCL 4 MG/2ML IJ SOLN
4.0000 mg | Freq: Four times a day (QID) | INTRAMUSCULAR | Status: DC | PRN
  Administered 2011-06-13 – 2011-06-14 (×2): 4 mg via INTRAVENOUS
  Filled 2011-06-11 (×3): qty 2

## 2011-06-11 MED ORDER — MENTHOL 3 MG MT LOZG: 1.0000 | LOZENGE | OROMUCOSAL | Status: DC | PRN

## 2011-06-11 MED ORDER — ONDANSETRON HCL 4 MG/2ML IJ SOLN
INTRAMUSCULAR | Status: DC | PRN
  Administered 2011-06-11: 4 mg via INTRAVENOUS

## 2011-06-11 MED ORDER — MIDAZOLAM HCL 2 MG/2ML IJ SOLN: 1.0000 mg | INTRAMUSCULAR | Status: DC | PRN

## 2011-06-11 MED ORDER — INSULIN ASPART 100 UNIT/ML ~~LOC~~ SOLN
0.0000 [IU] | Freq: Three times a day (TID) | SUBCUTANEOUS | Status: DC
  Administered 2011-06-12: 2 [IU] via SUBCUTANEOUS
  Administered 2011-06-12 (×2): 3 [IU] via SUBCUTANEOUS
  Administered 2011-06-13: 2 [IU] via SUBCUTANEOUS
  Administered 2011-06-13: 3 [IU] via SUBCUTANEOUS
  Administered 2011-06-13 – 2011-06-14 (×2): 2 [IU] via SUBCUTANEOUS
  Administered 2011-06-14: 3 [IU] via SUBCUTANEOUS
  Administered 2011-06-14: 8 [IU] via SUBCUTANEOUS
  Administered 2011-06-15 (×2): 3 [IU] via SUBCUTANEOUS
  Filled 2011-06-11: qty 3

## 2011-06-11 MED ORDER — FENTANYL CITRATE 0.05 MG/ML IJ SOLN
INTRAMUSCULAR | Status: DC | PRN
  Administered 2011-06-11 (×3): 50 ug via INTRAVENOUS
  Administered 2011-06-11: 100 ug via INTRAVENOUS

## 2011-06-11 MED ORDER — CALCIUM CARBONATE ANTACID 500 MG PO CHEW
1.0000 | CHEWABLE_TABLET | Freq: Two times a day (BID) | ORAL | Status: DC
  Administered 2011-06-12 – 2011-06-15 (×7): 200 mg via ORAL
  Filled 2011-06-11 (×9): qty 1

## 2011-06-11 MED ORDER — DOCUSATE SODIUM 100 MG PO CAPS
100.0000 mg | ORAL_CAPSULE | Freq: Two times a day (BID) | ORAL | Status: DC
  Administered 2011-06-11 – 2011-06-15 (×8): 100 mg via ORAL
  Filled 2011-06-11 (×8): qty 1

## 2011-06-11 MED ORDER — KCL IN DEXTROSE-NACL 20-5-0.45 MEQ/L-%-% IV SOLN
INTRAVENOUS | Status: DC
  Filled 2011-06-11 (×12): qty 1000

## 2011-06-11 MED ORDER — MIDAZOLAM HCL 5 MG/5ML IJ SOLN
INTRAMUSCULAR | Status: DC | PRN
  Administered 2011-06-11: 2 mg via INTRAVENOUS

## 2011-06-11 MED ORDER — ACETAMINOPHEN 325 MG PO TABS: 650.0000 mg | ORAL_TABLET | ORAL | Status: DC | PRN

## 2011-06-11 MED ORDER — ONDANSETRON HCL 4 MG/2ML IJ SOLN: 4.0000 mg | Freq: Four times a day (QID) | INTRAMUSCULAR | Status: DC | PRN

## 2011-06-11 MED ORDER — LORAZEPAM 2 MG/ML IJ SOLN
1.0000 mg | Freq: Once | INTRAMUSCULAR | Status: DC | PRN
Start: 2011-06-11 — End: 2011-06-11

## 2011-06-11 MED ORDER — SIMVASTATIN 20 MG PO TABS
20.0000 mg | ORAL_TABLET | Freq: Every day | ORAL | Status: DC
  Administered 2011-06-11 – 2011-06-14 (×4): 20 mg via ORAL
  Filled 2011-06-11 (×5): qty 1

## 2011-06-11 MED ORDER — PROMETHAZINE HCL 25 MG/ML IJ SOLN: 6.2500 mg | INTRAMUSCULAR | Status: DC | PRN

## 2011-06-11 MED ORDER — ONDANSETRON HCL 4 MG/2ML IJ SOLN
4.0000 mg | INTRAMUSCULAR | Status: DC | PRN
  Filled 2011-06-11: qty 2

## 2011-06-11 MED ORDER — HETASTARCH-ELECTROLYTES 6 % IV SOLN
INTRAVENOUS | Status: DC | PRN
  Administered 2011-06-11: 14:00:00 via INTRAVENOUS

## 2011-06-11 MED ORDER — SODIUM CHLORIDE 0.9 % IR SOLN
Status: DC | PRN
  Administered 2011-06-11: 15:00:00

## 2011-06-11 MED ORDER — INSULIN ASPART 100 UNIT/ML ~~LOC~~ SOLN
4.0000 [IU] | Freq: Three times a day (TID) | SUBCUTANEOUS | Status: DC
  Administered 2011-06-12 – 2011-06-14 (×9): 4 [IU] via SUBCUTANEOUS
  Filled 2011-06-11: qty 3

## 2011-06-11 MED ORDER — PHENYLEPHRINE HCL 10 MG/ML IJ SOLN
20.0000 mg | INTRAVENOUS | Status: DC | PRN
  Administered 2011-06-11: 10 ug/min via INTRAVENOUS

## 2011-06-11 MED ORDER — SODIUM CHLORIDE 0.9 % IV SOLN
INTRAVENOUS | Status: DC | PRN
  Administered 2011-06-11: 12:00:00 via INTRAVENOUS

## 2011-06-11 MED ORDER — AMLODIPINE BESYLATE 5 MG PO TABS
5.0000 mg | ORAL_TABLET | Freq: Every day | ORAL | Status: DC
  Administered 2011-06-12 – 2011-06-15 (×3): 5 mg via ORAL
  Filled 2011-06-11 (×4): qty 1

## 2011-06-11 MED ORDER — VITAMIN D3 25 MCG (1000 UNIT) PO TABS
1000.0000 [IU] | ORAL_TABLET | Freq: Every day | ORAL | Status: DC
  Administered 2011-06-11 – 2011-06-15 (×5): 1000 [IU] via ORAL
  Filled 2011-06-11 (×5): qty 1

## 2011-06-11 MED ORDER — DIPHENHYDRAMINE HCL 50 MG/ML IJ SOLN: 12.5000 mg | Freq: Four times a day (QID) | INTRAMUSCULAR | Status: DC | PRN

## 2011-06-11 MED ORDER — EPHEDRINE SULFATE 50 MG/ML IJ SOLN
INTRAMUSCULAR | Status: DC | PRN
  Administered 2011-06-11 (×3): 5 mg via INTRAVENOUS

## 2011-06-11 MED ORDER — 0.9 % SODIUM CHLORIDE (POUR BTL) OPTIME
TOPICAL | Status: DC | PRN
  Administered 2011-06-11: 1000 mL

## 2011-06-11 MED ORDER — FENTANYL CITRATE 0.05 MG/ML IJ SOLN: 50.0000 ug | INTRAMUSCULAR | Status: DC | PRN

## 2011-06-11 MED ORDER — HYDROMORPHONE 0.3 MG/ML IV SOLN
INTRAVENOUS | Status: DC
  Administered 2011-06-11: 0.2 mg via INTRAVENOUS

## 2011-06-11 MED ORDER — NEBIVOLOL HCL 5 MG PO TABS
5.0000 mg | ORAL_TABLET | Freq: Every day | ORAL | Status: DC
  Administered 2011-06-12 – 2011-06-15 (×2): 5 mg via ORAL
  Filled 2011-06-11 (×5): qty 1

## 2011-06-11 MED ORDER — RAMIPRIL 10 MG PO CAPS
10.0000 mg | ORAL_CAPSULE | Freq: Two times a day (BID) | ORAL | Status: DC
  Administered 2011-06-11 – 2011-06-15 (×6): 10 mg via ORAL
  Filled 2011-06-11 (×9): qty 1

## 2011-06-11 MED ORDER — SODIUM CHLORIDE 0.9 % IV SOLN: 250.0000 mL | INTRAVENOUS | Status: DC

## 2011-06-11 MED ORDER — CEFAZOLIN SODIUM 1-5 GM-% IV SOLN
1.0000 g | Freq: Three times a day (TID) | INTRAVENOUS | Status: AC
  Administered 2011-06-11 – 2011-06-12 (×2): 1 g via INTRAVENOUS
  Filled 2011-06-11 (×2): qty 50

## 2011-06-11 MED ORDER — OXYCODONE-ACETAMINOPHEN 5-325 MG PO TABS
1.0000 | ORAL_TABLET | ORAL | Status: DC | PRN
  Administered 2011-06-11: 1 via ORAL
  Administered 2011-06-12 (×3): 2 via ORAL
  Administered 2011-06-12: 1 via ORAL
  Administered 2011-06-13 (×3): 2 via ORAL
  Administered 2011-06-13 – 2011-06-14 (×3): 1 via ORAL
  Administered 2011-06-14: 2 via ORAL
  Administered 2011-06-14 (×2): 1 via ORAL
  Administered 2011-06-15: 2 via ORAL
  Filled 2011-06-11 (×2): qty 2
  Filled 2011-06-11: qty 1
  Filled 2011-06-11 (×3): qty 2
  Filled 2011-06-11 (×2): qty 1
  Filled 2011-06-11 (×3): qty 2
  Filled 2011-06-11 (×2): qty 1
  Filled 2011-06-11 (×2): qty 2

## 2011-06-11 MED ORDER — CALCIUM CARBONATE 600 MG PO TABS
600.0000 mg | ORAL_TABLET | Freq: Two times a day (BID) | ORAL | Status: DC
  Filled 2011-06-11: qty 1

## 2011-06-11 MED ORDER — PROPRANOLOL HCL 10 MG PO TABS
10.0000 mg | ORAL_TABLET | Freq: Four times a day (QID) | ORAL | Status: DC | PRN
  Filled 2011-06-11: qty 1

## 2011-06-11 MED ORDER — SODIUM CHLORIDE 0.9 % IJ SOLN
3.0000 mL | Freq: Two times a day (BID) | INTRAMUSCULAR | Status: DC
  Administered 2011-06-11 – 2011-06-15 (×7): 3 mL via INTRAVENOUS

## 2011-06-11 SURGICAL SUPPLY — 89 items
ADH SKN CLS APL DERMABOND .7 (GAUZE/BANDAGES/DRESSINGS) ×1
APL SKNCLS STERI-STRIP NONHPOA (GAUZE/BANDAGES/DRESSINGS) ×1
BAG DECANTER FOR FLEXI CONT (MISCELLANEOUS) ×3 IMPLANT
BENZOIN TINCTURE PRP APPL 2/3 (GAUZE/BANDAGES/DRESSINGS) ×1 IMPLANT
BLADE SURG ROTATE 9660 (MISCELLANEOUS) IMPLANT
BONE VOID FILLER STRIP 10CC (Bone Implant) ×2 IMPLANT
BUR MATCHSTICK NEURO 3.0 LAGG (BURR) ×2 IMPLANT
BUR PRECISION FLUTE 5.0 (BURR) ×2 IMPLANT
CAGE 9MM (Cage) ×2 IMPLANT
CANISTER SUCTION 2500CC (MISCELLANEOUS) ×2 IMPLANT
CLOTH BEACON ORANGE TIMEOUT ST (SAFETY) ×2 IMPLANT
CONT SPEC 4OZ CLIKSEAL STRL BL (MISCELLANEOUS) ×5 IMPLANT
COVER BACK TABLE 24X17X13 BIG (DRAPES) IMPLANT
COVER TABLE BACK 60X90 (DRAPES) ×2 IMPLANT
DERMABOND ADVANCED (GAUZE/BANDAGES/DRESSINGS) ×1
DERMABOND ADVANCED .7 DNX12 (GAUZE/BANDAGES/DRESSINGS) ×1 IMPLANT
DRAPE C-ARM 42X72 X-RAY (DRAPES) ×4 IMPLANT
DRAPE LAPAROTOMY 100X72X124 (DRAPES) ×2 IMPLANT
DRAPE POUCH INSTRU U-SHP 10X18 (DRAPES) ×2 IMPLANT
DRAPE PROXIMA HALF (DRAPES) ×2 IMPLANT
DRAPE SURG 17X23 STRL (DRAPES) ×2 IMPLANT
DRESSING TELFA 8X3 (GAUZE/BANDAGES/DRESSINGS) IMPLANT
DURAPREP 26ML APPLICATOR (WOUND CARE) ×2 IMPLANT
ELECT REM PT RETURN 9FT ADLT (ELECTROSURGICAL) ×2
ELECTRODE REM PT RTRN 9FT ADLT (ELECTROSURGICAL) ×1 IMPLANT
EVACUATOR 1/8 PVC DRAIN (DRAIN) ×1 IMPLANT
GAUZE SPONGE 4X4 12PLY STRL LF (GAUZE/BANDAGES/DRESSINGS) ×1 IMPLANT
GAUZE SPONGE 4X4 16PLY XRAY LF (GAUZE/BANDAGES/DRESSINGS) ×2 IMPLANT
GLOVE BIO SURGEON STRL SZ7 (GLOVE) ×1 IMPLANT
GLOVE BIO SURGEON STRL SZ8 (GLOVE) ×4 IMPLANT
GLOVE BIOGEL PI IND STRL 6.5 (GLOVE) IMPLANT
GLOVE BIOGEL PI IND STRL 7.0 (GLOVE) IMPLANT
GLOVE BIOGEL PI IND STRL 8 (GLOVE) IMPLANT
GLOVE BIOGEL PI IND STRL 8.5 (GLOVE) ×2 IMPLANT
GLOVE BIOGEL PI INDICATOR 6.5 (GLOVE) ×2
GLOVE BIOGEL PI INDICATOR 7.0 (GLOVE) ×1
GLOVE BIOGEL PI INDICATOR 8 (GLOVE)
GLOVE BIOGEL PI INDICATOR 8.5 (GLOVE) ×3
GLOVE ECLIPSE 7.5 STRL STRAW (GLOVE) ×2 IMPLANT
GLOVE ECLIPSE 8.5 STRL (GLOVE) ×1 IMPLANT
GLOVE EXAM NITRILE LRG STRL (GLOVE) IMPLANT
GLOVE EXAM NITRILE MD LF STRL (GLOVE) ×2 IMPLANT
GLOVE EXAM NITRILE XL STR (GLOVE) IMPLANT
GLOVE EXAM NITRILE XS STR PU (GLOVE) IMPLANT
GLOVE SURG SS PI 6.5 STRL IVOR (GLOVE) ×3 IMPLANT
GOWN BRE IMP SLV AUR LG STRL (GOWN DISPOSABLE) ×1 IMPLANT
GOWN BRE IMP SLV AUR XL STRL (GOWN DISPOSABLE) ×7 IMPLANT
GOWN STRL REIN 2XL LVL4 (GOWN DISPOSABLE) ×1 IMPLANT
KIT BASIN OR (CUSTOM PROCEDURE TRAY) ×2 IMPLANT
KIT INFUSE LRG II (Orthopedic Implant) ×1 IMPLANT
KIT POSITION SURG JACKSON T1 (MISCELLANEOUS) ×2 IMPLANT
KIT ROOM TURNOVER OR (KITS) ×2 IMPLANT
MILL MEDIUM DISP (BLADE) ×2 IMPLANT
NDL 18GX1X1/2 (RX/OR ONLY) (NEEDLE) ×1 IMPLANT
NDL HYPO 25X1 1.5 SAFETY (NEEDLE) ×1 IMPLANT
NEEDLE 18GX1X1/2 (RX/OR ONLY) (NEEDLE) ×2 IMPLANT
NEEDLE HYPO 25X1 1.5 SAFETY (NEEDLE) ×2 IMPLANT
NEEDLE SPNL 18GX3.5 QUINCKE PK (NEEDLE) ×2 IMPLANT
NS IRRIG 1000ML POUR BTL (IV SOLUTION) ×2 IMPLANT
PACK LAMINECTOMY NEURO (CUSTOM PROCEDURE TRAY) ×2 IMPLANT
PAD ARMBOARD 7.5X6 YLW CONV (MISCELLANEOUS) ×6 IMPLANT
PATTIES SURGICAL .5 X.5 (GAUZE/BANDAGES/DRESSINGS) IMPLANT
PATTIES SURGICAL .5 X1 (DISPOSABLE) IMPLANT
PATTIES SURGICAL 1X1 (DISPOSABLE) IMPLANT
PEEK PLIF NOVEL 9X25X8MM (Peek) ×2 IMPLANT
ROD 80MM (Rod) ×2 IMPLANT
SCREW 40MM (Screw) ×4 IMPLANT
SCREW 45MM (Screw) ×2 IMPLANT
SCREW POLYAXIAL 5.5X50MM (Screw) ×1 IMPLANT
SCREW SET SPINAL STD HEXALOBE (Screw) ×8 IMPLANT
SPONGE GAUZE 4X4 12PLY (GAUZE/BANDAGES/DRESSINGS) ×1 IMPLANT
SPONGE LAP 4X18 X RAY DECT (DISPOSABLE) ×1 IMPLANT
SPONGE SURGIFOAM ABS GEL 100 (HEMOSTASIS) ×1 IMPLANT
STAPLER SKIN PROX WIDE 3.9 (STAPLE) IMPLANT
STRIP CLOSURE SKIN 1/2X4 (GAUZE/BANDAGES/DRESSINGS) ×3 IMPLANT
SUT VIC AB 1 CT1 18XBRD ANBCTR (SUTURE) ×2 IMPLANT
SUT VIC AB 1 CT1 8-18 (SUTURE) ×4
SUT VIC AB 2-0 CT1 18 (SUTURE) ×4 IMPLANT
SUT VIC AB 3-0 SH 8-18 (SUTURE) ×4 IMPLANT
SYR 20CC LL (SYRINGE) ×2 IMPLANT
SYR 3ML LL SCALE MARK (SYRINGE) ×8 IMPLANT
SYR 5ML LL (SYRINGE) IMPLANT
SYR INSULIN 1ML 31GX6 SAFETY (SYRINGE) IMPLANT
TAPE CLOTH SURG 4X10 WHT LF (GAUZE/BANDAGES/DRESSINGS) ×1 IMPLANT
TOWEL OR 17X24 6PK STRL BLUE (TOWEL DISPOSABLE) ×2 IMPLANT
TOWEL OR 17X26 10 PK STRL BLUE (TOWEL DISPOSABLE) ×2 IMPLANT
TRAP SPECIMEN MUCOUS 40CC (MISCELLANEOUS) ×2 IMPLANT
TRAY FOLEY CATH 14FRSI W/METER (CATHETERS) ×2 IMPLANT
WATER STERILE IRR 1000ML POUR (IV SOLUTION) ×2 IMPLANT

## 2011-06-11 NOTE — Anesthesia Postprocedure Evaluation (Signed)
  Anesthesia Post-op Note  Patient: Becky Forbes  Procedure(s) Performed: Procedure(s) (LRB): POSTERIOR LUMBAR FUSION 3 LEVEL (Bilateral)  Patient Location: PACU  Anesthesia Type: General  Level of Consciousness: awake  Airway and Oxygen Therapy: Patient Spontanous Breathing  Post-op Pain: mild  Post-op Assessment: Post-op Vital signs reviewed, Patient's Cardiovascular Status Stable, Respiratory Function Stable, Patent Airway, No signs of Nausea or vomiting and Pain level controlled  Post-op Vital Signs: stable  Complications: No apparent anesthesia complications

## 2011-06-11 NOTE — Interval H&P Note (Signed)
History and Physical Interval Note:  06/11/2011 7:38 AM  Becky Forbes  has presented today for surgery, with the diagnosis of spondylolisthesis lumbar herniated disc lumbar stenosis lumbar radiculopathy  The various methods of treatment have been discussed with the patient and family. After consideration of risks, benefits and other options for treatment, the patient has consented to  Procedure(s) (LRB): POSTERIOR LUMBAR FUSION 3 LEVEL (N/A) as a surgical intervention .  The patients' history has been reviewed, patient examined, no change in status, stable for surgery.  I have reviewed the patients' chart and labs.  Questions were answered to the patient's satisfaction.     [name]  Date of Initial H&P: 05/15/2011  History reviewed, patient examined, no change in status, stable for surgery.

## 2011-06-11 NOTE — Anesthesia Preprocedure Evaluation (Addendum)
Anesthesia Evaluation  Patient identified by MRN, date of birth, ID band Patient awake    Reviewed: Allergy & Precautions, H&P , NPO status , Patient's Chart, lab work & pertinent test results  History of Anesthesia Complications (+) PONV  Airway Mallampati: I TM Distance: >3 FB Neck ROM: Full    Dental   Pulmonary    Pulmonary exam normal       Cardiovascular hypertension, Pt. on medications + dysrhythmias Atrial Fibrillation Regular Normal    Neuro/Psych    GI/Hepatic   Endo/Other  Diabetes mellitus-, Well Controlled, Type 2  Renal/GU      Musculoskeletal   Abdominal (+) obese,   Peds  Hematology   Anesthesia Other Findings   Reproductive/Obstetrics                          Anesthesia Physical Anesthesia Plan  ASA: III  Anesthesia Plan: General   Post-op Pain Management:    Induction: Intravenous  Airway Management Planned: Oral ETT  Additional Equipment:   Intra-op Plan:   Post-operative Plan: Extubation in OR  Informed Consent: I have reviewed the patients History and Physical, chart, labs and discussed the procedure including the risks, benefits and alternatives for the proposed anesthesia with the patient or authorized representative who has indicated his/her understanding and acceptance.     Plan Discussed with: CRNA and Surgeon  Anesthesia Plan Comments:         Anesthesia Quick Evaluation

## 2011-06-11 NOTE — Op Note (Signed)
06/11/2011  4:46 PM  PATIENT:  Becky Forbes  71 y.o. female  PRE-OPERATIVE DIAGNOSIS: scoliosis, spondylolisthesis lumbar, herniated disc, lumbar stenosis, lumbar radiculopathy  POST-OPERATIVE DIAGNOSIS:  scoliosis, spondylolisthesis lumbar, herniated disc, lumbar stenosis, lumbar radiculopathy  PROCEDURE:  Procedure(s) (LRB): POSTERIOR LUMBAR FUSION 3 LEVEL Redo laminectomy, PLIF, Pedicle screw fixation, PLA  SURGEON:  Surgeon(s) and Role:    * Dorian Heckle, MD - Primary    * Stefani Dama, MD - Assisting  PHYSICIAN ASSISTANT:   ASSISTANTS: none   ANESTHESIA:   general  EBL:  Total I/O In: 3900 [I.V.:2800; Blood:100; IV Piggyback:1000] Out: 525 [Urine:225; Blood:300]  BLOOD ADMINISTERED:100cc CC CELLSAVER  DRAINS: (Medium) Hemovact drain(s) in the Epidural Space with  Suction Open   LOCAL MEDICATIONS USED:  LIDOCAINE   SPECIMEN:  No Specimen  DISPOSITION OF SPECIMEN:  N/A  COUNTS:  YES  TOURNIQUET:  * No tourniquets in log *   DICTATION: Patient is 71 year old woman with rate want to mobile spondylolisthesis of L 3 on L 4 and of L 4 on L 5 and of L5 on S1 with lumbar scoliosis. She has a severe left lumbar radiculopathy. She had previously undergone laminectomies at L 3/4, L 4/5, L 5/S1 levels. It was elected to take her to surgery for decompression and fusion at this level.   Procedure: Patient was placed in a prone position on the [place] table after smooth and uncomplicated induction of general endotracheal anesthesia. Her low back was prepped and draped in usual sterile fashion with DuraPrep. Area of incision was infiltrated with local lidocaine. Previous incision was reopened. Incision was made to the lumbodorsal fascia was incised and exposure was performed of the L3-S1 spinous processes laminae facet joint and transverse processes. Intraoperative x-ray was obtained which confirmed correct orientation. A total laminectomy of L3 through S1 was performed with  disarticulation of the facet joints at each  level and thorough decompression was performed after painstaking dissection through scar tissue of both L3, L4, L5,  and S1 nerve roots along with the common dural tube. A thorough discectomy was initially performed at L3/4 and L5/S1 bilaterally.  I was unable to enter the L4/5 disc space, despite use of chisels and curettes; it appeared that this level was auto-fusing. Neural elements were thoroughly decompressed at each level.  Endplates were prepared for grafting at L 3/4 and L 5/S1 levels bilaterally and a thorough discectomy was performed at these levels. Bone autograft was packed within the interspace bilaterally along with large BMP kit and NexOss bone graft extender. Bilateral median 8 mm peek cages were packed with BMP and extender and was inserted the interspace at L 3/4 and countersunk appropriately. Bilateral median 9 mm peek cages were packed with BMP and extender and was inserted the interspace at L 5/S1 level and countersunk appropriately.The posterolateral region was extensively decorticated and pedicle probes were placed at L3, L4, L5, and S1 bilaterally. Intraoperative fluoroscopy confirmed correct orientationin the AP and lateral plane. 40 x 6.5 mm pedicle screws were placed at S1 bilaterally and 40 x 5.5 mm screws placed at L5 bilaterally, 45 x 5.5 mm screws were placed at L 4, and at L 3 50 x 5.5 mm screws were placed.  Final x-rays demonstrated well-positioned interbody grafts and pedicle screw fixation.  80 mm lordotic rods were placed on the bilaterally and locked down in situ and the posterolateral region was packed with the remaining BMP and bone graft on the right and BMP bone  graft extender. A medium Hemovac drain was placed in the epidural space through a separate stab incision.  Fascia was closed with 1 Vicryl sutures skin edges were reapproximated 2 and 3-0 Vicryl sutures. The wound is dressed with benzoin Steri-Strips Telfa gauze and tape  the patient was extubated in the operating room and taken to recovery in stable satisfactory condition having tolerated the procedure well. Counts were correct at the end of the case.  PLAN OF CARE: Admit to inpatient   PATIENT DISPOSITION:  PACU - hemodynamically stable.   Delay start of Pharmacological VTE agent (>24hrs) due to surgical blood loss or risk of bleeding: yes

## 2011-06-11 NOTE — Progress Notes (Signed)
Attempted to call CBC results to Dr.Stern...no answer. Called CBC results to Dr. Jordan Likes (on call)--received order to repeat CBC in 4 hours. Dr.Smith also notified--no orders received from him. Will carry out orders and cont to monitor closely.

## 2011-06-11 NOTE — Anesthesia Procedure Notes (Signed)
Procedure Name: Intubation Date/Time: 06/11/2011 11:18 AM Performed by: Ellin Goodie Pre-anesthesia Checklist: Patient identified, Emergency Drugs available, Suction available, Patient being monitored and Timeout performed Patient Re-evaluated:Patient Re-evaluated prior to inductionOxygen Delivery Method: Circle system utilized Preoxygenation: Pre-oxygenation with 100% oxygen Intubation Type: IV induction Ventilation: Mask ventilation without difficulty Laryngoscope Size: Mac and 3 Grade View: Grade I Tube type: Oral Tube size: 7.5 mm Number of attempts: 1 Airway Equipment and Method: Stylet Placement Confirmation: ETT inserted through vocal cords under direct vision,  positive ETCO2 and breath sounds checked- equal and bilateral Secured at: 21 cm Tube secured with: Tape Dental Injury: Teeth and Oropharynx as per pre-operative assessment

## 2011-06-11 NOTE — Transfer of Care (Signed)
Immediate Anesthesia Transfer of Care Note  Patient: Becky Forbes  Procedure(s) Performed: Procedure(s) (LRB): POSTERIOR LUMBAR FUSION 3 LEVEL (Bilateral)  Patient Location: PACU  Anesthesia Type: General  Level of Consciousness: awake, alert  and oriented  Airway & Oxygen Therapy: Patient Spontanous Breathing and Patient connected to nasal cannula oxygen  Post-op Assessment: Report given to PACU RN, Post -op Vital signs reviewed and stable and Patient moving all extremities X 4  Post vital signs: Reviewed and stable  Complications: No apparent anesthesia complications

## 2011-06-12 LAB — CBC
MCV: 92.1 fL (ref 78.0–100.0)
Platelets: 232 10*3/uL (ref 150–400)
RDW: 14 % (ref 11.5–15.5)
WBC: 10.1 10*3/uL (ref 4.0–10.5)

## 2011-06-12 LAB — GLUCOSE, CAPILLARY
Glucose-Capillary: 147 mg/dL — ABNORMAL HIGH (ref 70–99)
Glucose-Capillary: 175 mg/dL — ABNORMAL HIGH (ref 70–99)
Glucose-Capillary: 151 mg/dL — ABNORMAL HIGH (ref 70–99)

## 2011-06-12 NOTE — Progress Notes (Signed)
Subjective: Sore in back and hips  Objective: Vital signs in last 24 hours: Temp:  [97.2 F (36.2 C)-98.5 F (36.9 C)] 98.5 F (36.9 C) (02/27 0700) Pulse Rate:  [44-81] 58  (02/27 0700) Resp:  [11-28] 12  (02/27 0700) BP: (109-150)/(36-84) 129/36 mmHg (02/27 0700) SpO2:  [94 %-100 %] 100 % (02/27 0700) Weight:  [86.8 kg (191 lb 5.8 oz)] 86.8 kg (191 lb 5.8 oz) (02/26 1845) Weight change:     Intake/Output from previous day: 02/26 0701 - 02/27 0700 In: 5145 [P.O.:120; I.V.:3700; Blood:100; IV Piggyback:1100] Out: 1300 [Urine:980; Drains:20; Blood:300] Last cbgs: CBG (last 3)   Basename 06/11/11 2109 06/11/11 1655 06/11/11 0849  GLUCAP 217* 163* 127*     Physical Exam Full strength bilateral lower extremities. Dressing CDI  Lab Results:  Basename 06/12/11 0400 06/11/11 1722  WBC 10.1 9.2  HGB 7.7* 7.8*  HCT 23.4* 23.1*  PLT 232 212   BMET No results found for this basename: NA:2,K:2,CL:2,CO2:2,GLUCOSE:2,BUN:2,CREATININE:2,CALCIUM:2 in the last 72 hours  Studies/Results: Dg Lumbar Spine 2-3 Views  06/11/2011  *RADIOLOGY REPORT*  Clinical Data: Lumbar fusion  LUMBAR SPINE - 2-3 VIEW  Comparison: 05/15/2011  Findings: Four fluoroscopic spot images document bilateral pedicle screw placement L3, L4 , L5, and S1.  Graft markers project in the L3-4 and L5-S1 interspaces.  IMPRESSION:  1.  Instrumented spinal fusion changes L3-S1 as above.  Original Report Authenticated By: Osa Craver, M.D.   Dg Lumbar Spine 1 View  06/11/2011  *RADIOLOGY REPORT*  Clinical Data: Intraoperative localization, L3 - L4, L4 - L5 and L5 - S1 fusion  LUMBAR SPINE - 1 VIEW  Comparison: Lumbar spine MRI - 04/29/2011  Findings:  A single intraoperative lateral radiograph of the lumbar spine is provided for review.  Lumbar spine labeling is based on preprocedural MRI.  Radiopaque surgical instrument tips are seen posterior to the L3, L4, L5 and S1 vertebral bodies.  Additional surgical  instruments are seen overlying the soft tissues posterior to these levels.  IMPRESSION: Intraoperative spinal localization of above.  Original Report Authenticated By: Waynard Reeds, M.D.    Medications: I have reviewed the patient's current medications.  Assessment/Plan: D/C drain.  D/C foley.  Mobilize. Transfer to 3000. Dorian Heckle , MD 06/12/2011, 7:58 AM

## 2011-06-12 NOTE — Progress Notes (Signed)
Occupational Therapy Evaluation Patient Details Name: Becky Forbes MRN: [id number] DOB: 30-Mar-1941 Today's Date: 06/12/2011  Problem List:  Patient Active Problem List  Diagnoses  . DIABETES MELLITUS, UNCONTROLLED  . HYPERLIPIDEMIA  . 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 HYPERTENSION  . PAROXYSMAL ATRIAL FIBRILLATION  . DEGENERATIVE JOINT DISEASE, KNEE  . OSTEOPENIA  . CAROTID BRUIT, RIGHT  . CATARACT EXTRACTION, RIGHT EYE, HX OF    Past Medical History:  Past Medical History  Diagnosis Date  . HTN (hypertension)   . Hyperlipemia   . Diabetes mellitus     x 10 yrs  . PONV (postoperative nausea and vomiting)   . Atrial fibrillation     CLEARED BY DR [facility] 05/27/11   Past Surgical History:  Past Surgical History  Procedure Date  . Cataract extraction 05/25/07    RIGHT EYE  . Appendectomy   . Carpal tunnel release   . Abdominal hysterectomy   . Lumbar laminectomy   . Foot surgery 2006    MID FOOT RECONSTRUCTION  . Osteotomy 2006    AND FUSION  . Ganglion cyst excision 12/2007    Dr.Gramig  . Total knee arthroplasty 05/02/08, 07/25/08    Dr.Alusio  . Joint replacement     bilateral  . Pars plana vitrectomy w/ repair of macular hole     OT Assessment/Plan/Recommendation OT Assessment Clinical Impression Statement: Pt s/p lumbar fusion thus affecting PLOF.  Will benefit from acute OT to address below problem list in prep for d/c home with husband. OT Recommendation/Assessment: Patient will need skilled OT in the acute care venue OT Problem List: Decreased activity tolerance;Decreased knowledge of use of DME or AE;Decreased knowledge of precautions;Pain OT Therapy Diagnosis : Generalized weakness;Acute pain OT Plan OT Frequency: Min 2X/week OT Treatment/Interventions: Self-care/ADL training;DME and/or AE instruction;Therapeutic activities;Patient/family education OT Recommendation Equipment Recommended: None recommended by OT Individuals Consulted Consulted  and Agree with Results and Recommendations: Patient OT Goals Acute Rehab OT Goals OT Goal Formulation: With patient Time For Goal Achievement: 7 days ADL Goals Pt Will Perform Grooming: with modified independence;Standing at sink ADL Goal: Grooming - Progress: Goal set today Pt Will Perform Lower Body Bathing: with modified independence;Sit to stand from chair;Sit to stand from bed;with adaptive equipment ADL Goal: Lower Body Bathing - Progress: Goal set today Pt Will Perform Lower Body Dressing: with modified independence;Sit to stand from chair;Sit to stand from bed;with adaptive equipment ADL Goal: Lower Body Dressing - Progress: Goal set today Pt Will Transfer to Toilet: with modified independence;Regular height toilet;with DME;Ambulation;Maintaining back safety precautions ADL Goal: Toilet Transfer - Progress: Goal set today Pt Will Perform Tub/Shower Transfer: Tub transfer;with supervision;with DME;Ambulation;Anterior-posterior transfer;Transfer tub bench (bench if pt will have one at home) ADL Goal: Tub/Shower Transfer - Progress: Goal set today  OT Evaluation Precautions/Restrictions  Precautions Precautions: Back Precaution Booklet Issued: Yes (comment) Precaution Comments: Reviewed back precautions, posture, and body mechanics Required Braces or Orthoses: Yes Spinal Brace: Lumbar corset;Applied in sitting position Restrictions Weight Bearing Restrictions: No Prior Functioning Home Living Lives With: Spouse Type of Home: House Home Layout: Two level;Able to live on main level with bedroom/bathroom Home Access: Stairs to enter Entrance Stairs-Rails: None Entrance Stairs-Number of Steps: 2 Bathroom Shower/Tub: Engineer, manufacturing systems: Standard Home Adaptive Equipment: Walker - rolling;Straight cane Additional Comments: Pt able to borrow tub DME from daughter.  Unsure whether it is tub chair or bench.  Pt plans to verify tub DME. Prior Function Level of  Independence:  Independent with basic ADLs;Independent with homemaking with ambulation Driving: Yes Vocation: Retired ADL ADL Lower Body Bathing: Simulated;Moderate assistance Lower Body Bathing Details (indicate cue type and reason): difficulty crossing bil. LE to reach feet Where Assessed - Lower Body Bathing: Sit to stand from chair Lower Body Dressing: Simulated;Moderate assistance Lower Body Dressing Details (indicate cue type and reason): difficulty crossing bil. LE to reach feet Where Assessed - Lower Body Dressing: Sit to stand from chair Toilet Transfer: Performed;Minimal assistance Toilet Transfer Details (indicate cue type and reason): VC for maintaining back precautions Toilet Transfer Method: Ambulating Toilet Transfer Equipment: Regular height toilet Toileting - Clothing Manipulation: Performed;Supervision/safety Toileting - Clothing Manipulation Details (indicate cue type and reason): supervision for safety and maintaining back precautions Where Assessed - Toileting Clothing Manipulation: Sit to stand from 3-in-1 or toilet Toileting - Hygiene: Performed;Supervision/safety Toileting - Hygiene Details (indicate cue type and reason): supervision for maintaining back precautions Where Assessed - Toileting Hygiene: Sit on 3-in-1 or toilet Equipment Used: Rolling walker Vision/Perception    Cognition Cognition Arousal/Alertness: Awake/alert Overall Cognitive Status: Appears within functional limits for tasks assessed Orientation Level: Oriented X4 Sensation/Coordination Sensation Light Touch: Appears Intact Coordination Gross Motor Movements are Fluid and Coordinated: Yes (bil. UE) Fine Motor Movements are Fluid and Coordinated: Yes (bil. UE) Extremity Assessment RUE Assessment RUE Assessment: Within Functional Limits LUE Assessment LUE Assessment: Within Functional Limits Mobility  Bed Mobility  Transfers Transfers: Yes Sit to Stand: 4: Min assist;From  toilet;With upper extremity assist Sit to Stand Details (indicate cue type and reason): VC for safe hand placement.  Stand to Sit: 4: Min assist;To chair/3-in-1;With armrests;With upper extremity assist Stand to Sit Details: VC for safe hand placement and to control descent Exercises   End of Session OT - End of Session Equipment Utilized During Treatment: Gait belt;Back brace Activity Tolerance: Patient tolerated treatment well Patient left: in chair;with call bell in reach;with family/visitor present General Behavior During Session: [facility] for tasks performed Cognition: [facility] for tasks performed   Cipriano Mile 06/12/2011, 10:42 AM  06/12/2011 Cipriano Mile OTR/L Pager [phone] Office [phone]

## 2011-06-12 NOTE — Progress Notes (Signed)
Clinical Social Work received consult for [employer] planning and ? Snf placement.  Reviewed chart, along with epic notes.  No PT notes noted.  Patient alert and oriented reporting she wants to go home at dc and no home health needed at this time.  Patient being transferred to [address].  Daughter present in the room and agreeable for dc plan.  Full assessment in shadow chart for review.  CSW signed off.  Notified treatment team as well as CM.  Ashley Jacobs, MSW LCSW [phone] ** For Dede Query

## 2011-06-12 NOTE — Evaluation (Signed)
Physical Therapy Evaluation Patient Details Name: Becky Forbes MRN: [id number] DOB: 1940-07-22 Today's Date: 06/12/2011  Problem List:  Patient Active Problem List  Diagnoses  . DIABETES MELLITUS, UNCONTROLLED  . HYPERLIPIDEMIA  . 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 HYPERTENSION  . PAROXYSMAL ATRIAL FIBRILLATION  . DEGENERATIVE JOINT DISEASE, KNEE  . OSTEOPENIA  . CAROTID BRUIT, RIGHT  . CATARACT EXTRACTION, RIGHT EYE, HX OF    Past Medical History:  Past Medical History  Diagnosis Date  . HTN (hypertension)   . Hyperlipemia   . Diabetes mellitus     x 10 yrs  . PONV (postoperative nausea and vomiting)   . Atrial fibrillation     CLEARED BY DR [facility] 05/27/11   Past Surgical History:  Past Surgical History  Procedure Date  . Cataract extraction 05/25/07    RIGHT EYE  . Appendectomy   . Carpal tunnel release   . Abdominal hysterectomy   . Lumbar laminectomy   . Foot surgery 2006    MID FOOT RECONSTRUCTION  . Osteotomy 2006    AND FUSION  . Ganglion cyst excision 12/2007    Dr.Gramig  . Total knee arthroplasty 05/02/08, 07/25/08    Dr.Alusio  . Joint replacement     bilateral  . Pars plana vitrectomy w/ repair of macular hole     PT Assessment/Plan/Recommendation PT Assessment Clinical Impression Statement: Patient presents S/P lumbar fusion. She will benefit from PT in the acute care setting to ensure she can return home at modified independent level to facilitate decreased burden of care on spouse. She will also be provided with education on post-op back care to ensure optimal healing. PT Recommendation/Assessment: Patient will need skilled PT in the acute care venue PT Problem List: Decreased activity tolerance;Decreased mobility;Decreased knowledge of precautions;Pain;Decreased knowledge of use of DME PT Therapy Diagnosis : Difficulty walking;Acute pain PT Plan PT Frequency: Min 5X/week PT Treatment/Interventions: DME instruction;Gait training;Stair  training;Functional mobility training;Therapeutic activities;Therapeutic exercise;Patient/family education PT Recommendation Follow Up Recommendations: No PT follow up Equipment Recommended: None recommended by PT PT Goals  Acute Rehab PT Goals PT Goal Formulation: With patient Time For Goal Achievement: 7 days Pt will Roll Supine to Right Side: with modified independence PT Goal: Rolling Supine to Right Side - Progress: Goal set today Pt will Roll Supine to Left Side: with modified independence PT Goal: Rolling Supine to Left Side - Progress: Goal set today Pt will go Supine/Side to Sit: with modified independence PT Goal: Supine/Side to Sit - Progress: Goal set today Pt will go Sit to Supine/Side: with modified independence PT Goal: Sit to Supine/Side - Progress: Goal set today Pt will go Sit to Stand: with modified independence;with upper extremity assist PT Goal: Sit to Stand - Progress: Goal set today Pt will go Stand to Sit: with modified independence;with upper extremity assist PT Goal: Stand to Sit - Progress: Goal set today Pt will Ambulate: >150 feet;with modified independence;with least restrictive assistive device PT Goal: Ambulate - Progress: Goal set today Pt will Go Up / Down Stairs: 3-5 stairs;with least restrictive assistive device;with min assist (or hand held assistance) PT Goal: Up/Down Stairs - Progress: Goal set today Additional Goals Additional Goal #1: Patient will recall back precautions and demonstrate adherence to in functional activity. PT Goal: Additional Goal #1 - Progress: Goal set today  PT Evaluation Precautions/Restrictions  Precautions Precautions: Back Precaution Booklet Issued: Yes (comment) Precaution Comments: Reviewed back precautions, posture, and body mechanics Required Braces or Orthoses: Yes  Spinal Brace: Lumbar corset;Applied in sitting position Restrictions Weight Bearing Restrictions: No Prior Functioning  Home Living Lives With:  Spouse Type of Home: House Home Layout: Two level;Able to live on main level with bedroom/bathroom Home Access: Stairs to enter Entrance Stairs-Rails: None Entrance Stairs-Number of Steps: 2 Bathroom Shower/Tub: Engineer, manufacturing systems: Standard Home Adaptive Equipment: Walker - rolling;Straight cane Additional Comments: ? 3 in 1 as had both knees replaced. Will check with spouse Prior Function Level of Independence: Independent with basic ADLs;Independent with homemaking with ambulation Driving: Yes Vocation: Retired Financial risk analyst Arousal/Alertness: Awake/alert Overall Cognitive Status: Appears within functional limits for tasks assessed Orientation Level: Oriented X4 Sensation/Coordination Sensation Light Touch: Appears Intact Coordination Gross Motor Movements are Fluid and Coordinated: Yes Fine Motor Movements are Fluid and Coordinated: Yes Extremity Assessment RLE Assessment RLE Assessment: Within Functional Limits LLE Assessment LLE Assessment: Within Functional Limits Mobility (including Balance) Bed Mobility Bed Mobility: Yes Rolling Right: 4: Min assist Rolling Right Details (indicate cue type and reason): Education on log roll technique. Assistance for initiation Right Sidelying to Sit: 4: Min assist Right Sidelying to Sit Details (indicate cue type and reason): Education on sequencing of lower extremities and trunk. Assistance to ensure correct sequencing. Sitting - Scoot to Edge of Bed: 5: Supervision Sitting - Scoot to [place] of Bed Details (indicate cue type and reason): Good technique with upper extremity use- no twisting Transfers Transfers: Yes Sit to Stand: 4: Min assist;With upper extremity assist;From bed Sit to Stand Details (indicate cue type and reason): Education on hand placement to and from rolling walker. Assistance mostly to initiate.  Stand to Sit: 4: Min assist;With upper extremity assist;To toilet Stand to Sit Details: Use of grab  bar to control descent and assistance of PT secondary to lower height of surface. Ambulation/Gait Ambulation/Gait: Yes Ambulation/Gait Assistance: 4: Min assist Ambulation/Gait Assistance Details (indicate cue type and reason): Decreased speed secondary to patient apprehensive regarding increasing pain. Verbal cues for upright posture and to manitain safe distance from rolling walker Ambulation Distance (Feet): 100 Feet Assistive device: Rolling walker Gait Pattern: Step-through pattern;Decreased stride length    End of Session PT - End of Session Equipment Utilized During Treatment: Gait belt;Back brace Activity Tolerance: Patient tolerated treatment well Patient left:  (with OT for toileting) Nurse Communication: Mobility status for transfers;Mobility status for ambulation General Behavior During Session: [facility] for tasks performed Cognition: [facility] for tasks performed  Edwyna Perfect, PT  Pager [phone] 06/12/2011, 9:34 AM

## 2011-06-13 LAB — POCT I-STAT 4, (NA,K, GLUC, HGB,HCT)
Glucose, Bld: 127 mg/dL — ABNORMAL HIGH (ref 70–99)
Potassium: 4.9 mEq/L (ref 3.5–5.1)
Sodium: 137 mEq/L (ref 135–145)

## 2011-06-13 LAB — GLUCOSE, CAPILLARY
Glucose-Capillary: 175 mg/dL — ABNORMAL HIGH (ref 70–99)
Glucose-Capillary: 150 mg/dL — ABNORMAL HIGH (ref 70–99)
Glucose-Capillary: 146 mg/dL — ABNORMAL HIGH (ref 70–99)
Glucose-Capillary: 137 mg/dL — ABNORMAL HIGH (ref 70–99)

## 2011-06-13 MED ORDER — FERROUS SULFATE 325 (65 FE) MG PO TABS
325.0000 mg | ORAL_TABLET | Freq: Three times a day (TID) | ORAL | Status: DC
  Administered 2011-06-13 – 2011-06-14 (×2): 325 mg via ORAL
  Filled 2011-06-13 (×5): qty 1

## 2011-06-13 NOTE — Progress Notes (Addendum)
Physical Therapy Treatment Patient Details Name: Becky Forbes MRN: [id number] DOB: February 28, 1941 Today's Date: 06/13/2011  PT Assessment/Plan  PT - Assessment/Plan Comments on Treatment Session: Patient increasing functional independence with mobility however she is not compliant with back precautions at all times.  PT Plan: Discharge plan remains appropriate;Frequency remains appropriate Follow Up Recommendations: No PT follow up Equipment Recommended: None recommended by PT PT Goals  Acute Rehab PT Goals PT Goal: Sit to Supine/Side - Progress: Progressing toward goal PT Goal: Sit to Stand - Progress: Progressing toward goal PT Goal: Stand to Sit - Progress: Progressing toward goal PT Goal: Ambulate - Progress: Progressing toward goal Additional Goals PT Goal: Additional Goal #1 - Progress: Progressing toward goal  PT Treatment Precautions/Restrictions  Precautions Precautions: Back Precaution Booklet Issued: Yes (comment) Precaution Comments: Reviewed back precautions and patient able to recall 2/3 without cueing Required Braces or Orthoses: Yes Spinal Brace: Lumbar corset;Applied in sitting position Restrictions Weight Bearing Restrictions: No Mobility (including Balance) Bed Mobility Sit to Sidelying Left: 4: Min assist Sit to Sidelying Left Details (indicate cue type and reason): Educated in correct sequencing of lower extremities and trunk as patient trying to lie directly on to back. Also required education again regarding back precautions as sat forward in bed to scoot self up in the bed. Transfers Sit to Stand: 5: Supervision;With upper extremity assist;From chair/3-in-1 Sit to Stand Details (indicate cue type and reason): Correct hand placement without cueing x 2  Stand to Sit: To chair/3-in-1;To bed;5: Supervision;With upper extremity assist Stand to Sit Details: Safe control of descent Ambulation/Gait Ambulation/Gait Assistance: 5: Supervision Ambulation/Gait  Assistance Details (indicate cue type and reason): Gait WNL with the exception of decr. in speed. Patient with nausea and dizziness with ambulation, so distance limited. BP post gait 97/64, discussed with RN and regatrding nausea.  Ambulation Distance (Feet): 100 Feet Assistive device: Rolling walker Gait Pattern: Within Functional Limits    End of Session PT - End of Session Equipment Utilized During Treatment: Back brace Activity Tolerance:  (Limited by dizzinessa nd nausea). ? Related to low Hgb levels last 2 draws. Patient left: in bed;with call bell in reach;with family/visitor present Nurse Communication: Mobility status for transfers;Mobility status for ambulation General Behavior During Session: [facility] for tasks performed Cognition: [facility] for tasks performed  Edwyna Perfect, PT  Pager [phone] 06/13/2011, 8:31 AM

## 2011-06-13 NOTE — Progress Notes (Signed)
Occupational Therapy Treatment Patient Details Name: Becky Forbes MRN: [id number] DOB: [birth year] Today's Date: 06/13/2011  OT Assessment/Plan OT Assessment/Plan Comments on Treatment Session: Pt progressing well and able to perform LB ADLs with use of AE and supervision. Pt. able to recall 2/3 back precautions and educated on 3/3 precautions. OT Frequency: Min 2X/week Follow Up Recommendations: No OT follow up Equipment Recommended: None recommended by OT OT Goals Acute Rehab OT Goals OT Goal Formulation: With patient Time For Goal Achievement: 7 days ADL Goals Pt Will Perform Lower Body Bathing: with modified independence;Sit to stand from chair;Sit to stand from bed;with adaptive equipment ADL Goal: Lower Body Bathing - Progress: Progressing toward goals Pt Will Perform Lower Body Dressing: with modified independence;Sit to stand from chair;Sit to stand from bed;with adaptive equipment ADL Goal: Lower Body Dressing - Progress: Progressing toward goals  OT Treatment Precautions/Restrictions  Precautions Precautions: Back Precaution Comments: Reviewed back precautions and patient able to recall 2/3 without cueing Spinal Brace: Lumbar corset;Applied in sitting position Restrictions Weight Bearing Restrictions: No   ADL ADL Lower Body Bathing: Simulated;Set up Lower Body Bathing Details (indicate cue type and reason): With use of long handled sponge Where Assessed - Lower Body Bathing: Sit to stand from bed Lower Body Dressing: Simulated;Set up Lower Body Dressing Details (indicate cue type and reason): With donning bil socks and pants with reacher and sock aid to maintain back precautions Where Assessed - Lower Body Dressing: Sit to stand from bed ADL Comments: Pt. educated on use of AE to complete LB ADLs and returned demonstrated Mobility  Bed Mobility Bed Mobility: Yes Rolling Left: 6: Modified independent (Device/Increase time);With rail Left Sidelying to Sit: HOB  flat;7: Independent Transfers Sit to Stand: 5: Supervision;With upper extremity assist;From chair/3-in-1 Sit to Stand Details (indicate cue type and reason): Min verbal cues for hand placement     End of Session OT - End of Session Equipment Utilized During Treatment: Gait belt;Back brace Activity Tolerance: Patient tolerated treatment well Patient left: in bed;with call bell in reach Nurse Communication: Mobility status for transfers General Behavior During Session: [facility] for tasks performed Cognition: [facility] for tasks performed  Dexter Signor, OTR/L Pager [phone]  06/13/2011, 2:48 PM

## 2011-06-13 NOTE — Progress Notes (Signed)
Subjective: Patient reports "I feel good."  Objective: Vital signs in last 24 hours: Temp:  [98 F (36.7 C)-99.7 F (37.6 C)] 98.6 F (37 C) (02/28 0314) Pulse Rate:  [53-78] 78  (02/28 0314) Resp:  [13-20] 20  (02/28 0314) BP: (116-142)/(36-70) 138/70 mmHg (02/28 0314) SpO2:  [92 %-100 %] 92 % (02/28 0314)  Intake/Output from previous day: 02/27 0701 - 02/28 0700 In: 225 [I.V.:225] Out: 275 [Urine:275] Intake/Output this shift:    Alert, conversant, sitting in chair.  Good strength BLE. No c/o pain at present. Drsg intact, not removed this visit.  Lab Results:  Basename 06/12/11 0400 06/11/11 1722  WBC 10.1 9.2  HGB 7.7* 7.8*  HCT 23.4* 23.1*  PLT 232 212   BMET No results found for this basename: NA:2,K:2,CL:2,CO2:2,GLUCOSE:2,BUN:2,CREATININE:2,CALCIUM:2 in the last 72 hours  Studies/Results: Dg Lumbar Spine 2-3 Views  06/11/2011  *RADIOLOGY REPORT*  Clinical Data: Lumbar fusion  LUMBAR SPINE - 2-3 VIEW  Comparison: 05/15/2011  Findings: Four fluoroscopic spot images document bilateral pedicle screw placement L3, L4 , L5, and S1.  Graft markers project in the L3-4 and L5-S1 interspaces.  IMPRESSION:  1.  Instrumented spinal fusion changes L3-S1 as above.  Original Report Authenticated By: Osa Craver, M.D.   Dg Lumbar Spine 1 View  06/11/2011  *RADIOLOGY REPORT*  Clinical Data: Intraoperative localization, L3 - L4, L4 - L5 and L5 - S1 fusion  LUMBAR SPINE - 1 VIEW  Comparison: Lumbar spine MRI - 04/29/2011  Findings:  A single intraoperative lateral radiograph of the lumbar spine is provided for review.  Lumbar spine labeling is based on preprocedural MRI.  Radiopaque surgical instrument tips are seen posterior to the L3, L4, L5 and S1 vertebral bodies.  Additional surgical instruments are seen overlying the soft tissues posterior to these levels.  IMPRESSION: Intraoperative spinal localization of above.  Original Report Authenticated By: Waynard Reeds, M.D.     Assessment/Plan: Improved   LOS: 2 days  Continue to mobilize in LSO.   Georgiann Cocker 06/13/2011, 7:40 AM     As above.

## 2011-06-14 LAB — GLUCOSE, CAPILLARY
Glucose-Capillary: 145 mg/dL — ABNORMAL HIGH (ref 70–99)
Glucose-Capillary: 133 mg/dL — ABNORMAL HIGH (ref 70–99)
Glucose-Capillary: 274 mg/dL — ABNORMAL HIGH (ref 70–99)
Glucose-Capillary: 95 mg/dL (ref 70–99)

## 2011-06-14 MED ORDER — BISACODYL 10 MG RE SUPP
10.0000 mg | Freq: Once | RECTAL | Status: AC
  Administered 2011-06-14: 10 mg via RECTAL
  Filled 2011-06-14: qty 1

## 2011-06-14 NOTE — Progress Notes (Signed)
CARE MANAGEMENT NOTE 06/14/2011  Comments:  06/13/11 1645 Vance Peper, RN BSN Case Manager Received call from pt's husband- Mr. Dungee, they do not have a rolling walker, requesting it be ordered. They do have a 3in1. Entered order in [facility].

## 2011-06-14 NOTE — Progress Notes (Signed)
OT Cancellation Note  Pt politely declined therapy session this afternoon due to nausea and vomiting.  Pt agreeable to sitting up in chair this afternoon with nursing staff assistance.  RN made aware. Will re-attempt tomorrow.  06/14/2011 Cipriano Mile OTR/L Pager [phone] Office [phone]

## 2011-06-14 NOTE — Progress Notes (Signed)
Subjective: Patient reports "I'm not doing as good. I vomited this morning."  Objective: Vital signs in last 24 hours: Temp:  [97.8 F (36.6 C)-99.8 F (37.7 C)] 98.6 F (37 C) (03/01 0600) Pulse Rate:  [74-120] 77  (03/01 0600) Resp:  [17-18] 18  (03/01 0600) BP: (92-125)/(58-70) 109/68 mmHg (03/01 0600) SpO2:  [91 %-97 %] 94 % (03/01 0600)  Intake/Output from previous day: 02/28 0701 - 03/01 0700 In: 243 [P.O.:240; I.V.:3] Out: -  Intake/Output this shift: Total I/O In: 240 [P.O.:240] Out: -   Alert, conversant. Family present. N/V this am and fatigue. Lumbar pain controlled with Percocet. Nausea tx w/Zofran. Incision without erythema, swelling, drainage. Good strength BLE.   Lab Results:  Basename 06/12/11 0400 06/11/11 1722  WBC 10.1 9.2  HGB 7.7* 7.8*  HCT 23.4* 23.1*  PLT 232 212   BMET  Basename 06/11/11 1538  NA 137  K 4.9  CL --  CO2 --  GLUCOSE 127*  BUN --  CREATININE --  CALCIUM --    Studies/Results: No results found.  Assessment/Plan: Pain controlled, monitoring GI status.  LOS: 3 days  Hopeful of d/c to home soon, pending resolution of N/V.   Georgiann Cocker 06/14/2011, 8:47 AM

## 2011-06-14 NOTE — Progress Notes (Signed)
Physical Therapy Treatment Patient Details Name: Becky Forbes MRN: [id number] DOB: 03-Oct-1940 Today's Date: 06/14/2011  PT Assessment/Plan  PT - Assessment/Plan Comments on Treatment Session: Patient contniues to be limted by feelings of fatigue and nausea. We will practice bed mobility without rail and stairs tomorrow. PT Plan: Discharge plan remains appropriate;Frequency remains appropriate Follow Up Recommendations: No PT follow up Equipment Recommended:  (Walker delivered to room) PT Goals  Acute Rehab PT Goals PT Goal: Rolling Supine to Left Side - Progress: Met PT Goal: Sit to Supine/Side - Progress: Met PT Goal: Sit to Stand - Progress: Progressing toward goal PT Goal: Stand to Sit - Progress: Progressing toward goal PT Goal: Ambulate - Progress: Progressing toward goal Additional Goals PT Goal: Additional Goal #1 - Progress: Progressing toward goal  PT Treatment Precautions/Restrictions  Precautions Precautions: Back Precaution Booklet Issued: Yes (comment) Precaution Comments: Continue to review back precaution sas patient requires cues to recall. Spouse present during education today too. Required Braces or Orthoses: Yes Spinal Brace: Lumbar corset;Applied in sitting position Restrictions Weight Bearing Restrictions: No Mobility (including Balance) Bed Mobility Rolling Right: 6: Modified independent (Device/Increase time) Right Sidelying to Sit: 6: Modified independent (Device/Increase time);With rails Sitting - Scoot to Edge of Bed: 6: Modified independent (Device/Increase time) Sit to Sidelying Left: 6: Modified independent (Device/Increase time) Transfers Sit to Stand: 5: Supervision;With upper extremity assist;From bed Sit to Stand Details (indicate cue type and reason): Correct hand placement no cues needed Stand to Sit: To bed;5: Supervision;With upper extremity assist Stand to Sit Details: Correct hand placement no cues needed Ambulation/Gait Ambulation/Gait  Assistance: 5: Supervision Ambulation/Gait Assistance Details (indicate cue type and reason): Decr speed only secondary to feeling weak. Was nauseated this am and had episode of vomitting. Had patient try to teach me back precuations, but only recalls 2/3 unless given visual cue for arching Ambulation Distance (Feet): 170 Feet Assistive device: Rolling walker Gait Pattern: Within Functional Limits      End of Session PT - End of Session Equipment Utilized During Treatment: Back brace;Gait belt Activity Tolerance: Patient limited by fatigue (Declined stairs attempt) Patient left: in bed;with call bell in reach;with family/visitor present Nurse Communication: Mobility status for ambulation;Mobility status for transfers General Behavior During Session: [facility] for tasks performed Cognition: [facility] for tasks performed  Edwyna Perfect, PT  Pager [phone] 06/14/2011, 11:01 AM

## 2011-06-14 NOTE — Plan of Care (Signed)
Problem: Consults Goal: Diagnosis - Spinal Surgery Outcome: Completed/Met Date Met:  06/14/11 Thoraco/Lumbar Spine Fusion

## 2011-06-14 NOTE — Clinical Documentation Improvement (Signed)
Anemia Blood Loss Clarification  THIS DOCUMENT IS NOT A PERMANENT PART OF THE MEDICAL RECORD         06/14/11  Becky Cocker [facility],  In an effort to better capture your patient's severity of illness, reflect appropriate length of stay and utilization of resources, a review of the patient medical record has revealed the following indicators.    Based on your clinical judgment, please document in the progress notes and discharge summary if a condition below provides greater specificity regarding the patient's CBC results:   - Expected Acute Blood Loss Anemia   - Precipitous drop in Hematocrit   - Combined Expected ABLA and Precipitous drop in HCT   - Other Condition   - Unable to Clinically Determine  Clinical Information:  H&H results: 9.8/29.7     preop 06/03/11 7.5/22.0     postop 06/11/11 7.8/23.1     postop 06/11/11 7.7/23.4    postop 06/12/11  Intraoperative I&O per dictated op note In - 3900      IV 2800;  Blood 100;  IV Piggyback 1000  Out - 525     Urine 225;   Blood 300  BLOOD ADMINISTERED:100cc CC CELLSAVER  Serial CBC's postoperatively  In responding to this query please exercise your independent judgment.  The fact that a query is asked, does not imply that any particular answer is desired or expected.  Reviewed: query not addressed as of discharge.  Possible RQ for coder.  Becky Dad RN  Thank You,  Becky Ralph  RN BSN Certified Clinical Documentation Specialist: Cell [phone]  Health Information Management [place]  RESPOND TO THE THIS QUERY, FOLLOW THE INSTRUCTIONS BELOW:  1. If needed, update documentation for the patient's encounter via the notes activity.  2. Access this query again and click edit on the In [employer].  3. After updating, or not, click F2 to complete all highlighted (required) fields concerning your review. Select "additional documentation in the medical record" OR "no additional documentation  provided".  4. Click Sign note button.  5. The deficiency will fall out of your In Basket *Please let us know if you are not able to complete this workflow by phone or e-mail (listed below).

## 2011-06-15 LAB — TYPE AND SCREEN
ABO/RH(D): A POS
Antibody Screen: NEGATIVE
Unit division: 0

## 2011-06-15 MED ORDER — METHOCARBAMOL 500 MG PO TABS: 500.0000 mg | ORAL_TABLET | Freq: Four times a day (QID) | ORAL | Status: AC | PRN

## 2011-06-15 MED ORDER — OXYCODONE-ACETAMINOPHEN 5-325 MG PO TABS: 1.0000 | ORAL_TABLET | ORAL | Status: AC | PRN

## 2011-06-15 NOTE — Progress Notes (Signed)
Physical Therapy Treatment Patient Details Name: Becky Forbes MRN: [id number] DOB: January 19, 1941 Today's Date: 06/15/2011  PT Assessment/Plan  PT - Assessment/Plan Comments on Treatment Session: Great progression with increased gait distance and stair/bedmobility education completed.  PT Plan: Discharge plan remains appropriate;Frequency remains appropriate PT Frequency: Min 5X/week Follow Up Recommendations: No PT follow up Equipment Recommended: None recommended by PT PT Goals  Acute Rehab PT Goals PT Goal: Sit to Stand - Progress: Met PT Goal: Stand to Sit - Progress: Met PT Goal: Ambulate - Progress: Progressing toward goal PT Goal: Up/Down Stairs - Progress: Met Additional Goals PT Goal: Additional Goal #1 - Progress: Met  PT Treatment Precautions/Restrictions  Precautions Precautions: Back Precaution Booklet Issued: Yes (comment) Precaution Comments: pt able to recall and demo'd 3/3 back precautions with session today. Required Braces or Orthoses: Yes Spinal Brace: Lumbar corset;Applied in sitting position (pt with brace on upon entering room.) Restrictions Weight Bearing Restrictions: No Mobility (including Balance) Bed Mobility Bed Mobility: Yes (pt verbilized she has no problems with bed mobility) Transfers Sit to Stand: 6: Modified independent (Device/Increase time);From bed;With upper extremity assist Sit to Stand Details (indicate cue type and reason): stood x3 from bed. cues for hand placement x1. a step stool used with one trial due to bed elevated to home bed height and pt was educated on how to step onto step with RW backwards to be able to sit safely on elevated bed. Stand to Sit: 6: Modified independent (Device/Increase time);To bed;Without upper extremity assist Ambulation/Gait Ambulation/Gait Assistance: 6: Modified independent (Device/Increase time) Ambulation/Gait Assistance Details (indicate cue type and reason): demo's reciprocal gait pattern with normal  heel-toe progression and upright posture. good gait speed and use of RW. Ambulation Distance (Feet): 200 Feet Assistive device: Rolling walker Gait Pattern: Within Functional Limits;Step-through pattern Stairs: Yes Stairs Assistance: 4: Min assist;Other (comment) (HHA due to no rails) Stairs Assistance Details (indicate cue type and reason): with hand held assist pt able to go up 2 stairs with step to pattern (cues to utilize step to pattern vs alternating for energy conservation and safey) Stair Management Technique: No rails;Forwards;Other (comment) (HHA) Number of Stairs: 2        End of Session PT - End of Session Equipment Utilized During Treatment: Gait belt;Back brace Activity Tolerance: Patient tolerated treatment well Patient left: in bed (pt seated edge of bed with family after session) Nurse Communication: Mobility status for transfers;Mobility status for ambulation General Behavior During Session: [facility] for tasks performed Cognition: [facility] for tasks performed  Sallyanne Kuster 06/15/2011, 2:57 PM  Sallyanne Kuster, PTA Office- [phone]

## 2011-06-15 NOTE — Discharge Summary (Signed)
Physician Discharge Summary  Patient ID: Becky Forbes MRN: [id number] DOB/AGE: January 21, 1941 70 y.o.  Admit date: 06/11/2011 Discharge date: 06/15/2011  Admission Diagnoses: Lumbar spondylosis and stenosis of L2 to sacrum with radiculopathy  Discharge Diagnoses: Lumbar spondylosis and stenosis L2 to sacrum with radiculopathy Diabetes mellitus2 Active Problems:  * No active hospital problems. *    Discharged Condition: good  Hospital Course: Patient was admitted to undergo surgical decompression of her lumbar spine at multiple levels starting at L2-3 she tolerated her surgery well she is gradually been ambulated blood sugars have gradually come under control incision is clean .dry and she's been advised as to her postoperative activities  Consults: None  Significant Diagnostic Studies: None  Treatments: surgery: Decompression L2-3 3445 and 51 posterior lumbar interbody arthrodesis using peek spacers L2-3 L3-4 L4-5 and L5-S1 segmental fixation L2 to sacrum   Discharge Exam: Blood pressure 118/67, pulse 93, temperature 98.5 F (36.9 C), temperature source Oral, resp. rate 18, height 5\' 4"  (1.626 m), weight 86.8 kg (191 lb 5.8 oz), SpO2 93.00%. Incision is clean and dry motor function is intact in the lower extremities in iliopsoas quadriceps tibialis anterior and gastrocs station and gait are intact  Disposition: discharge    Discharge Orders    Future Appointments: Provider: Department: Dept Phone: Center:   07/26/2011 8:00 AM Romero Belling, MD [facility] [phone] [facility]     Future Orders Please Complete By Expires   Diet - low sodium heart healthy      Increase activity slowly      Discharge instructions      Comments:   Ambulate as tolerated. Sit straight walk straight stand straight. Okay to shower. No lifting greater than 10 pounds.   Call MD for:  temperature >100.4      Call MD for:  severe uncontrolled pain      Call MD for:  redness, tenderness, or signs of infection (pain,  swelling, redness, odor or green/yellow discharge around incision site)        Medication List  As of 06/15/2011  2:00 PM   TAKE these medications         amLODipine 5 MG tablet   Commonly known as: NORVASC   Take 1 tablet (5 mg total) by mouth daily.      aspirin EC 81 MG tablet   Take 81 mg by mouth daily.      calcium carbonate 600 MG Tabs   Commonly known as: OS-CAL   Take 600 mg by mouth 2 (two) times daily with a meal.      hydrochlorothiazide 25 MG tablet   Commonly known as: HYDRODIURIL   Take 1 tablet (25 mg total) by mouth daily.      methocarbamol 500 MG tablet   Commonly known as: ROBAXIN   Take 1 tablet (500 mg total) by mouth 4 (four) times daily as needed (Muscle spasm).      mulitivitamin with minerals Tabs   Take 1 tablet by mouth daily.      nebivolol 10 MG tablet   Commonly known as: BYSTOLIC   Take 5 mg by mouth daily.      oxyCODONE-acetaminophen 5-325 MG per tablet   Commonly known as: PERCOCET   Take 1-2 tablets by mouth every 4 (four) hours as needed for pain.      propranolol 10 MG tablet   Commonly known as: INDERAL   Take 10 mg by mouth 4 (four) times daily as needed. For palpitations  ramipril 10 MG capsule   Commonly known as: ALTACE   Take 1 capsule (10 mg total) by mouth 2 (two) times daily.      saxagliptin HCl 5 MG Tabs tablet   Commonly known as: ONGLYZA   Take 1 tablet (5 mg total) by mouth every morning.      simvastatin 20 MG tablet   Commonly known as: ZOCOR   Take 1 tablet (20 mg total) by mouth at bedtime.      VITAMIN D PO   Take 800 Units by mouth daily.           Follow-up Information    Follow up with STERN,JOSEPH D, MD. Schedule an appointment as soon as possible for a visit in 2 weeks. (Call for appointment)    Contact information:   [address], [address] [place] [address] [phone]          Signed: Stefani Dama 06/15/2011, 2:00 PM

## 2011-06-15 NOTE — Progress Notes (Signed)
Discharged to home, transported to family car via wheelchair

## 2011-06-18 MED FILL — Sodium Chloride IV Soln 0.9%: INTRAVENOUS | Qty: 1000 | Status: AC

## 2011-06-18 MED FILL — Sodium Chloride Irrigation Soln 0.9%: Qty: 3000 | Status: AC

## 2011-06-18 MED FILL — Heparin Sodium (Porcine) Inj 1000 Unit/ML: INTRAMUSCULAR | Qty: 30 | Status: AC

## 2011-07-03 DIAGNOSIS — M4310 Spondylolisthesis, site unspecified: Secondary | ICD-10-CM | POA: Diagnosis not present

## 2011-07-26 ENCOUNTER — Ambulatory Visit: Payer: Medicare Other | Admitting: Endocrinology

## 2011-08-09 ENCOUNTER — Encounter: Payer: Self-pay | Admitting: Endocrinology

## 2011-08-09 ENCOUNTER — Ambulatory Visit (INDEPENDENT_AMBULATORY_CARE_PROVIDER_SITE_OTHER): Payer: Medicare Other | Admitting: Endocrinology

## 2011-08-09 ENCOUNTER — Other Ambulatory Visit (INDEPENDENT_AMBULATORY_CARE_PROVIDER_SITE_OTHER): Payer: Medicare Other

## 2011-08-09 VITALS — BP 144/86 | HR 96 | Temp 97.5°F | Wt 182.0 lb

## 2011-08-09 DIAGNOSIS — H579 Unspecified disorder of eye and adnexa: Secondary | ICD-10-CM

## 2011-08-09 DIAGNOSIS — E1139 Type 2 diabetes mellitus with other diabetic ophthalmic complication: Secondary | ICD-10-CM | POA: Diagnosis not present

## 2011-08-09 DIAGNOSIS — E1165 Type 2 diabetes mellitus with hyperglycemia: Secondary | ICD-10-CM

## 2011-08-09 NOTE — Patient Instructions (Addendum)
blood tests are being requested for you today.  You will receive a letter with results. Based on the results, i may advise you to add "bromocriptine."  To avoid side effects of nausea and dizziness, take at bedtime, and take just 1/2 pill the first week.    Please come back for a follow-up appointment in 3 months.

## 2011-08-09 NOTE — Progress Notes (Signed)
Subjective:    Patient ID: Becky Forbes, female    DOB: [birth year], 71 y.o.   MRN: [id number] Diabetes  Pt returns for f/u of type 2 DM (dx'ed 1998, complicated by retinopathy.  She is not on metformin due to renal insuff).  no cbg record, but states cbg's vary from 98-150.  It is in general higher as the day goes on Past Medical History  Diagnosis Date  . HTN (hypertension)   . Hyperlipemia   . Diabetes mellitus     x 10 yrs  . PONV (postoperative nausea and vomiting)   . Atrial fibrillation     CLEARED BY DR [facility] 05/27/11    Past Surgical History  Procedure Date  . Cataract extraction 05/25/07    RIGHT EYE  . Appendectomy   . Carpal tunnel release   . Abdominal hysterectomy   . Lumbar laminectomy   . Foot surgery 2006    MID FOOT RECONSTRUCTION  . Osteotomy 2006    AND FUSION  . Ganglion cyst excision 12/2007    Dr.Gramig  . Total knee arthroplasty 05/02/08, 07/25/08    Dr.Alusio  . Joint replacement     bilateral  . 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 Marital Status: Married    Spouse Name: JAMES    Number of Children: 2  . Years of Education: N/A   Occupational History  . 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 Smoking status: Never Smoker   . Smokeless tobacco: Not on file  . Alcohol Use: No  . Drug Use: No  . Sexually Active: Not on file   Other Topics Concern  . Not on file   Social History Narrative  . No narrative on file    Current Outpatient Prescriptions on File Prior to Visit  Medication Sig Dispense Refill  . amLODipine (NORVASC) 5 MG tablet Take 1 tablet (5 mg total) by mouth daily.  90 tablet  1  . aspirin EC 81 MG tablet Take 81 mg by mouth daily.      . calcium carbonate (OS-CAL) 600 MG TABS Take 600 mg by mouth 2 (two) times daily with a meal.      . Cholecalciferol (VITAMIN D PO) Take 800 Units by mouth daily.        . hydrochlorothiazide  (HYDRODIURIL) 25 MG tablet Take 1 tablet (25 mg total) by mouth daily.  90 tablet  3  . Multiple Vitamin (MULITIVITAMIN WITH MINERALS) TABS Take 1 tablet by mouth daily.      . nebivolol (BYSTOLIC) 10 MG tablet Take 5 mg by mouth daily.      . propranolol (INDERAL) 10 MG tablet Take 10 mg by mouth 4 (four) times daily as needed. For palpitations      . ramipril (ALTACE) 10 MG capsule Take 1 capsule (10 mg total) by mouth 2 (two) times daily.  180 capsule  2  . saxagliptin HCl (ONGLYZA) 5 MG TABS tablet Take 1 tablet (5 mg total) by mouth every morning.  30 tablet  11  . simvastatin (ZOCOR) 20 MG tablet Take 1 tablet (20 mg total) by mouth at bedtime.  90 tablet  1    Allergies  Allergen Reactions  . Morphine And Related Itching, Nausea And Vomiting and Rash  . Actos (Pioglitazone Hydrochloride) Swelling  . Januvia (Sitagliptin Phosphate) Other (See  Comments)    Pt says this caused elevated liver enzymes and creatinine  . Percocet (Oxycodone-Acetaminophen) Nausea And Vomiting  . Hydrocodone-Acetaminophen Itching and Nausea And Vomiting    Family History  Problem Relation Age of Onset  . Coronary artery disease Father   . Hypertension Father   . Stroke Father   . Diabetes Father   . Kidney failure Mother   . Hypertension Mother   . Other Brother     SEPSIS  . Diabetes Brother   . Diabetes Sister   . Kidney failure Sister     BP 144/86  Pulse 96  Temp(Src) 97.5 F (36.4 C) (Oral)  Wt 182 lb (82.555 kg)  SpO2 98%  Review of Systems  denies hypoglycemia.      Objective:   Physical Exam VITAL SIGNS:  See vs page GENERAL: no distress Pulses: dorsalis pedis intact bilat.   Feet: no deformity.  no ulcer on the feet.  feet are of normal color and temp.  no edema.  There is bilateral onychomycosis Neuro: sensation is intact to touch on the feet  Lab Results  Component Value Date   HGBA1C 7.7* 08/09/2011      Assessment & Plan:  DM.   Needs increased rx, if it can be  done with a regimen that avoids or minimizes hypoglycemia.

## 2011-08-10 ENCOUNTER — Encounter: Payer: Self-pay | Admitting: Endocrinology

## 2011-08-10 MED ORDER — BROMOCRIPTINE MESYLATE 2.5 MG PO TABS: 2.5000 mg | ORAL_TABLET | Freq: Every day | ORAL | Status: DC

## 2011-08-12 ENCOUNTER — Telehealth: Payer: Self-pay | Admitting: *Deleted

## 2011-08-12 NOTE — Telephone Encounter (Signed)
Called pt to inform of lab results, pt informed (letter also mailed to pt). 

## 2011-08-14 DIAGNOSIS — M545 Low back pain: Secondary | ICD-10-CM | POA: Diagnosis not present

## 2011-09-02 DIAGNOSIS — E11311 Type 2 diabetes mellitus with unspecified diabetic retinopathy with macular edema: Secondary | ICD-10-CM | POA: Diagnosis not present

## 2011-09-02 DIAGNOSIS — H35359 Cystoid macular degeneration, unspecified eye: Secondary | ICD-10-CM | POA: Diagnosis not present

## 2011-09-02 DIAGNOSIS — H35049 Retinal micro-aneurysms, unspecified, unspecified eye: Secondary | ICD-10-CM | POA: Diagnosis not present

## 2011-09-02 DIAGNOSIS — E1139 Type 2 diabetes mellitus with other diabetic ophthalmic complication: Secondary | ICD-10-CM | POA: Diagnosis not present

## 2011-09-02 DIAGNOSIS — E11339 Type 2 diabetes mellitus with moderate nonproliferative diabetic retinopathy without macular edema: Secondary | ICD-10-CM | POA: Diagnosis not present

## 2011-11-08 ENCOUNTER — Ambulatory Visit: Payer: Medicare Other | Admitting: Endocrinology

## 2011-11-18 DIAGNOSIS — M545 Low back pain: Secondary | ICD-10-CM | POA: Diagnosis not present

## 2011-11-19 ENCOUNTER — Encounter: Payer: Self-pay | Admitting: Endocrinology

## 2011-11-19 ENCOUNTER — Ambulatory Visit (INDEPENDENT_AMBULATORY_CARE_PROVIDER_SITE_OTHER): Payer: Medicare Other | Admitting: Endocrinology

## 2011-11-19 ENCOUNTER — Other Ambulatory Visit (INDEPENDENT_AMBULATORY_CARE_PROVIDER_SITE_OTHER): Payer: Medicare Other

## 2011-11-19 VITALS — BP 128/78 | HR 83 | Temp 97.4°F | Ht 67.0 in | Wt 187.0 lb

## 2011-11-19 DIAGNOSIS — E1165 Type 2 diabetes mellitus with hyperglycemia: Secondary | ICD-10-CM | POA: Diagnosis not present

## 2011-11-19 DIAGNOSIS — E1139 Type 2 diabetes mellitus with other diabetic ophthalmic complication: Secondary | ICD-10-CM

## 2011-11-19 DIAGNOSIS — H579 Unspecified disorder of eye and adnexa: Secondary | ICD-10-CM

## 2011-11-19 MED ORDER — NATEGLINIDE 60 MG PO TABS: 60.0000 mg | ORAL_TABLET | Freq: Three times a day (TID) | ORAL | Status: DC

## 2011-11-19 NOTE — Progress Notes (Signed)
Subjective:    Patient ID: Becky Forbes, female    DOB: 05/14/1940, 71 y.o.   MRN: [id number]  HPI Pt returns for f/u of type 2 DM (dx'ed 1998, complicated by retinopathy.  She is not on metformin due to renal insuff).  no cbg record, but states cbg's vary from 144-197.  It is in general higher as the day goes on. Past Medical History  Diagnosis Date  . HTN (hypertension)   . Hyperlipemia   . Diabetes mellitus     x 10 yrs  . PONV (postoperative nausea and vomiting)   . Atrial fibrillation     CLEARED BY DR [facility] 05/27/11    Past Surgical History  Procedure Date  . Cataract extraction 05/25/07    RIGHT EYE  . Appendectomy   . Carpal tunnel release   . Abdominal hysterectomy   . Lumbar laminectomy   . Foot surgery 2006    MID FOOT RECONSTRUCTION  . Osteotomy 2006    AND FUSION  . Ganglion cyst excision 12/2007    Dr.Gramig  . Total knee arthroplasty 05/02/08, 07/25/08    Dr.Alusio  . Joint replacement     bilateral  . 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 Marital Status: Married    Spouse Name: JAMES    Number of Children: 2  . Years of Education: N/A   Occupational History  . 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 Smoking status: Never Smoker   . Smokeless tobacco: Not on file  . Alcohol Use: No  . Drug Use: No  . Sexually Active: Not on file   Other Topics Concern  . Not on file   Social History Narrative  . No narrative on file    Current Outpatient Prescriptions on File Prior to Visit  Medication Sig Dispense Refill  . amLODipine (NORVASC) 5 MG tablet Take 1 tablet (5 mg total) by mouth daily.  90 tablet  1  . aspirin EC 81 MG tablet Take 81 mg by mouth daily.      . bromocriptine (PARLODEL) 2.5 MG tablet Take 1 tablet (2.5 mg total) by mouth daily.  90 tablet  3  . calcium carbonate (OS-CAL) 600 MG TABS Take 600 mg by mouth 2 (two) times daily with a meal.       . Cholecalciferol (VITAMIN D PO) Take 800 Units by mouth daily.        . hydrochlorothiazide (HYDRODIURIL) 25 MG tablet Take 1 tablet (25 mg total) by mouth daily.  90 tablet  3  . Multiple Vitamin (MULITIVITAMIN WITH MINERALS) TABS Take 1 tablet by mouth daily.      . nebivolol (BYSTOLIC) 10 MG tablet Take 5 mg by mouth daily.      . propranolol (INDERAL) 10 MG tablet Take 10 mg by mouth 4 (four) times daily as needed. For palpitations      . ramipril (ALTACE) 10 MG capsule Take 1 capsule (10 mg total) by mouth 2 (two) times daily.  180 capsule  2  . saxagliptin HCl (ONGLYZA) 5 MG TABS tablet Take 1 tablet (5 mg total) by mouth every morning.  30 tablet  11  . simvastatin (ZOCOR) 20 MG tablet Take 1 tablet (20 mg total) by mouth at bedtime.  90 tablet  1  . nateglinide (STARLIX) 60 MG tablet Take 1 tablet (  60 mg total) by mouth 3 (three) times daily before meals.  90 tablet  11    Allergies  Allergen Reactions  . Morphine And Related Itching, Nausea And Vomiting and Rash  . Actos (Pioglitazone Hydrochloride) Swelling  . Januvia (Sitagliptin Phosphate) Other (See Comments)    Pt says this caused elevated liver enzymes and creatinine  . Percocet (Oxycodone-Acetaminophen) Nausea And Vomiting  . Hydrocodone-Acetaminophen Itching and Nausea And Vomiting    Family History  Problem Relation Age of Onset  . Coronary artery disease Father   . Hypertension Father   . Stroke Father   . Diabetes Father   . Kidney failure Mother   . Hypertension Mother   . Other Brother     SEPSIS  . Diabetes Brother   . Diabetes Sister   . Kidney failure Sister     BP 128/78  Pulse 83  Temp 97.4 F (36.3 C) (Oral)  Ht 5\' 7"  (1.702 m)  Wt 187 lb (84.823 kg)  BMI 29.29 kg/m2  SpO2 97%    Review of Systems denies hypoglycemia    Objective:   Physical Exam VITAL SIGNS:  See vs page GENERAL: no distress NECK: There is no palpable thyroid enlargement.  No thyroid nodule is palpable.  No  palpable lymphadenopathy at the anterior neck.   Lab Results  Component Value Date   HGBA1C 7.9* 11/19/2011      Assessment & Plan:  DM.  needs increased rx

## 2011-11-19 NOTE — Patient Instructions (Addendum)
blood tests are being requested for you today.  You will receive a letter with results. If it is high, we can add "nateglanide." check your blood sugar once a day.  vary the time of day when you check, between before the 3 meals, and at bedtime.  also check if you have symptoms of your blood sugar being too high or too low.  please keep a record of the readings and bring it to your next appointment here.  please call us sooner if your blood sugar goes below 70, or if it stays over 200.  Please come back for a follow-up appointment in 3 months.

## 2011-11-20 ENCOUNTER — Telehealth: Payer: Self-pay | Admitting: *Deleted

## 2011-11-20 NOTE — Telephone Encounter (Signed)
Called pt to inform of lab results, pt informed (letter also mailed to pt). 

## 2011-12-06 DIAGNOSIS — E11319 Type 2 diabetes mellitus with unspecified diabetic retinopathy without macular edema: Secondary | ICD-10-CM | POA: Diagnosis not present

## 2011-12-06 DIAGNOSIS — Z961 Presence of intraocular lens: Secondary | ICD-10-CM | POA: Diagnosis not present

## 2011-12-06 DIAGNOSIS — E119 Type 2 diabetes mellitus without complications: Secondary | ICD-10-CM | POA: Diagnosis not present

## 2012-01-01 DIAGNOSIS — E11311 Type 2 diabetes mellitus with unspecified diabetic retinopathy with macular edema: Secondary | ICD-10-CM | POA: Diagnosis not present

## 2012-01-01 DIAGNOSIS — E11339 Type 2 diabetes mellitus with moderate nonproliferative diabetic retinopathy without macular edema: Secondary | ICD-10-CM | POA: Diagnosis not present

## 2012-01-01 DIAGNOSIS — E1139 Type 2 diabetes mellitus with other diabetic ophthalmic complication: Secondary | ICD-10-CM | POA: Diagnosis not present

## 2012-01-03 ENCOUNTER — Ambulatory Visit (INDEPENDENT_AMBULATORY_CARE_PROVIDER_SITE_OTHER): Payer: Medicare Other | Admitting: Cardiovascular Disease

## 2012-01-03 ENCOUNTER — Encounter: Payer: Self-pay | Admitting: Cardiovascular Disease

## 2012-01-03 VITALS — BP 130/80 | HR 71 | Ht 67.0 in | Wt 188.0 lb

## 2012-01-03 DIAGNOSIS — I4891 Unspecified atrial fibrillation: Secondary | ICD-10-CM

## 2012-01-03 DIAGNOSIS — I10 Essential (primary) hypertension: Secondary | ICD-10-CM | POA: Diagnosis not present

## 2012-01-03 NOTE — Patient Instructions (Addendum)
Your physician wants you to follow-up in: 12 months You will receive a reminder letter in the mail two months in advance. If you don't receive a letter, please call our office to schedule the follow-up appointment.  Your physician recommends that you return for a FASTING lipid profile: 12 months 

## 2012-01-03 NOTE — Assessment & Plan Note (Signed)
Becky Forbes has not had any problems. [name] still in normal sinus rhythm. [name] not had any palpitations. We'll continue with her same medications. I'll see her again in one year for an office visit and fasting labs.

## 2012-01-03 NOTE — Assessment & Plan Note (Signed)
Becky Forbes is doing well. We'll continue with her same medications. LC her again in one year for followup office visit. We'll check fasting labs at that time.

## 2012-01-03 NOTE — Progress Notes (Signed)
Becky Forbes Date of Birth  Nov 24, 1940 [facility]     [place] Office  [address]    [address]   [address] [place], [place]  [address]    [place], [place]  [address] [phone]  Fax  [phone]  [phone]  Fax [phone]  Problem list: 1. Atrial fibrillation- paroxysmal 2. Diabetes mellitus 3. Hypertension 4. Hyperlipidemia  History of Present Illness:  [name] not had any episodes of chest pain or shortness of breath.   She had back surgery earlier this year. [name] not had any chest pain or shortness breath. Her blood pressure has been well controlled.  [name] not had any episodes of atrial fibrillation.  Current Outpatient Prescriptions on File Prior to Visit  Medication Sig Dispense Refill  . amLODipine (NORVASC) 5 MG tablet Take 1 tablet (5 mg total) by mouth daily.  90 tablet  1  . aspirin EC 81 MG tablet Take 81 mg by mouth daily.      . bromocriptine (PARLODEL) 2.5 MG tablet Take 1 tablet (2.5 mg total) by mouth daily.  90 tablet  3  . calcium carbonate (OS-CAL) 600 MG TABS Take 600 mg by mouth 2 (two) times daily with a meal.      . Cholecalciferol (VITAMIN D PO) Take 800 Units by mouth daily.        . hydrochlorothiazide (HYDRODIURIL) 25 MG tablet Take 1 tablet (25 mg total) by mouth daily.  90 tablet  3  . Multiple Vitamin (MULITIVITAMIN WITH MINERALS) TABS Take 1 tablet by mouth daily.      . nateglinide (STARLIX) 60 MG tablet Take 1 tablet (60 mg total) by mouth 3 (three) times daily before meals.  90 tablet  11  . nebivolol (BYSTOLIC) 10 MG tablet Take 5 mg by mouth daily.      . propranolol (INDERAL) 10 MG tablet Take 10 mg by mouth 4 (four) times daily as needed. For palpitations      . ramipril (ALTACE) 10 MG capsule Take 1 capsule (10 mg total) by mouth 2 (two) times daily.  180 capsule  2  . saxagliptin HCl (ONGLYZA) 5 MG TABS tablet Take 1 tablet (5 mg total) by mouth every morning.  30 tablet  11  . simvastatin (ZOCOR) 20 MG tablet  Take 1 tablet (20 mg total) by mouth at bedtime.  90 tablet  1    Allergies  Allergen Reactions  . Morphine And Related Itching, Nausea And Vomiting and Rash  . Actos (Pioglitazone Hydrochloride) Swelling  . Januvia (Sitagliptin Phosphate) Other (See Comments)    Pt says this caused elevated liver enzymes and creatinine  . Percocet (Oxycodone-Acetaminophen) Nausea And Vomiting  . Hydrocodone-Acetaminophen Itching and Nausea And Vomiting    Past Medical History  Diagnosis Date  . HTN (hypertension)   . Hyperlipemia   . Diabetes mellitus     x 10 yrs  . PONV (postoperative nausea and vomiting)   . Atrial fibrillation     CLEARED BY DR [facility] 05/27/11    Past Surgical History  Procedure Date  . Cataract extraction 05/25/07    RIGHT EYE  . Appendectomy   . Carpal tunnel release   . Abdominal hysterectomy   . Lumbar laminectomy   . Foot surgery 2006    MID FOOT RECONSTRUCTION  . Osteotomy 2006    AND FUSION  . Ganglion cyst excision 12/2007    Dr.Gramig  . Total knee arthroplasty 05/02/08, 07/25/08  Dr.Alusio  . Joint replacement     bilateral  . Pars plana vitrectomy w/ repair of macular hole     History  Smoking status  . Never Smoker   Smokeless tobacco  . Not on file    History  Alcohol Use No    Family History  Problem Relation Age of Onset  . Coronary artery disease Father   . Hypertension Father   . Stroke Father   . Diabetes Father   . Kidney failure Mother   . Hypertension Mother   . Other Brother     SEPSIS  . Diabetes Brother   . Diabetes Sister   . Kidney failure Sister     Reviw of Systems:  Reviewed in the HPI.  All other systems are negative.  Physical Exam: Blood pressure 130/80, pulse 71, height 5\' 7"  (1.702 m), weight 188 lb (85.276 kg), SpO2 99.00%. General: Well developed, well nourished, in no acute distress.  Head: Normocephalic, atraumatic, sclera non-icteric, mucus membranes are moist,   Neck: Supple. Negative for carotid  bruits. JVD not elevated.  Lungs: Clear bilaterally to auscultation without wheezes, rales, or rhonchi. Breathing is unlabored.  Heart: RRR with S1 S2. No murmurs, rubs, or gallops appreciated.  Abdomen: Soft, non-tender, non-distended with normoactive bowel sounds. No hepatomegaly. No rebound/guarding. No obvious abdominal masses.  Msk:  Strength and tone appear normal for age.  Extremities: No clubbing or cyanosis. No edema.  Distal pedal pulses are 2+ and equal bilaterally.  Neuro: Alert and oriented X 3. Moves all extremities spontaneously.  Psych:  Responds to questions appropriately with a normal affect.  ECG: Sinus brady.  No St or T abn.  Assessment / Plan:

## 2012-01-08 ENCOUNTER — Encounter: Payer: Self-pay | Admitting: Cardiovascular Disease

## 2012-01-14 ENCOUNTER — Other Ambulatory Visit: Payer: Self-pay | Admitting: Internal Medicine

## 2012-01-14 MED ORDER — SIMVASTATIN 20 MG PO TABS: 20.0000 mg | ORAL_TABLET | Freq: Every day | ORAL | Status: DC

## 2012-01-14 MED ORDER — AMLODIPINE BESYLATE 5 MG PO TABS: 5.0000 mg | ORAL_TABLET | Freq: Every day | ORAL | Status: DC

## 2012-01-14 MED ORDER — RAMIPRIL 10 MG PO CAPS: 10.0000 mg | ORAL_CAPSULE | Freq: Two times a day (BID) | ORAL | Status: DC

## 2012-01-14 NOTE — Telephone Encounter (Signed)
REFILLS X 3 last OV 1.22.13 follow up DM  1-Ramipril (Cap) 10 MG Take 1 capsule (10 mg total) by mouth 2 (two) times daily #180--last fill 7.1.13  2-NORVASC 5 MG Take 1 tablet (5 mg total) by mouth daily #90--last fill 7.1.13  3-ZOCOR 20 MG Take 1 tablet (20 mg total) by mouth at bedtime #90--last fill 7.1.13

## 2012-01-14 NOTE — Telephone Encounter (Signed)
RX sent

## 2012-01-15 DIAGNOSIS — E11311 Type 2 diabetes mellitus with unspecified diabetic retinopathy with macular edema: Secondary | ICD-10-CM | POA: Diagnosis not present

## 2012-01-15 DIAGNOSIS — E1139 Type 2 diabetes mellitus with other diabetic ophthalmic complication: Secondary | ICD-10-CM | POA: Diagnosis not present

## 2012-01-17 DIAGNOSIS — Z23 Encounter for immunization: Secondary | ICD-10-CM | POA: Diagnosis not present

## 2012-01-30 DIAGNOSIS — E11339 Type 2 diabetes mellitus with moderate nonproliferative diabetic retinopathy without macular edema: Secondary | ICD-10-CM | POA: Diagnosis not present

## 2012-01-30 DIAGNOSIS — E1139 Type 2 diabetes mellitus with other diabetic ophthalmic complication: Secondary | ICD-10-CM | POA: Diagnosis not present

## 2012-01-30 DIAGNOSIS — E11311 Type 2 diabetes mellitus with unspecified diabetic retinopathy with macular edema: Secondary | ICD-10-CM | POA: Diagnosis not present

## 2012-02-12 DIAGNOSIS — Z1231 Encounter for screening mammogram for malignant neoplasm of breast: Secondary | ICD-10-CM | POA: Diagnosis not present

## 2012-02-26 ENCOUNTER — Ambulatory Visit: Payer: Medicare Other | Admitting: Endocrinology

## 2012-03-02 ENCOUNTER — Encounter: Payer: Self-pay | Admitting: Internal Medicine

## 2012-03-24 ENCOUNTER — Ambulatory Visit (INDEPENDENT_AMBULATORY_CARE_PROVIDER_SITE_OTHER): Payer: Medicare Other | Admitting: Endocrinology

## 2012-03-24 ENCOUNTER — Encounter: Payer: Self-pay | Admitting: Endocrinology

## 2012-03-24 VITALS — BP 114/80 | HR 133 | Temp 98.8°F | Resp 16 | Ht 64.0 in | Wt 188.0 lb

## 2012-03-24 DIAGNOSIS — E1165 Type 2 diabetes mellitus with hyperglycemia: Secondary | ICD-10-CM | POA: Diagnosis not present

## 2012-03-24 DIAGNOSIS — E1139 Type 2 diabetes mellitus with other diabetic ophthalmic complication: Secondary | ICD-10-CM

## 2012-03-24 LAB — HEMOGLOBIN A1C: Hgb A1c MFr Bld: 7.4 % — ABNORMAL HIGH (ref <5.7)

## 2012-03-24 MED ORDER — NATEGLINIDE 120 MG PO TABS: 60.0000 mg | ORAL_TABLET | Freq: Three times a day (TID) | ORAL | Status: DC

## 2012-03-24 NOTE — Patient Instructions (Addendum)
blood tests are being requested for you today.  We'll contact you with results. check your blood sugar once a day.  vary the time of day when you check, between before the 3 meals, and at bedtime.  also check if you have symptoms of your blood sugar being too high or too low.  please keep a record of the readings and bring it to your next appointment here.  please call us sooner if your blood sugar goes below 70, or if it stays over 200.  Please come back for a follow-up appointment in 3 months.

## 2012-03-24 NOTE — Progress Notes (Signed)
Subjective:    Patient ID: Becky Forbes, female    DOB: [birth year], 71 y.o.   MRN: [id number]  HPI Pt returns for f/u of type 2 DM (dx'ed 1998, complicated by retinopathy; she is not on metformin due to renal insuff).  pt states she feels well in general.  no cbg record, but states cbg's are well-controlled.   Past Medical History  Diagnosis Date  . HTN (hypertension)   . Hyperlipemia   . Diabetes mellitus     x 10 yrs  . PONV (postoperative nausea and vomiting)   . Atrial fibrillation     CLEARED BY DR [facility] 05/27/11    Past Surgical History  Procedure Date  . Cataract extraction 05/25/07    RIGHT EYE  . Appendectomy   . Carpal tunnel release   . Abdominal hysterectomy   . Lumbar laminectomy   . Foot surgery 2006    MID FOOT RECONSTRUCTION  . Osteotomy 2006    AND FUSION  . Ganglion cyst excision 12/2007    Dr.Gramig  . Total knee arthroplasty 05/02/08, 07/25/08    Dr.Alusio  . Joint replacement     bilateral  . 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 Marital Status: Married    Spouse Name: JAMES    Number of Children: 2  . Years of Education: N/A   Occupational History  . 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 Smoking status: Never Smoker   . Smokeless tobacco: Not on file  . Alcohol Use: No  . Drug Use: No  . Sexually Active: Not on file   Other Topics Concern  . Not on file   Social History Narrative  . No narrative on file    Current Outpatient Prescriptions on File Prior to Visit  Medication Sig Dispense Refill  . amLODipine (NORVASC) 5 MG tablet Take 1 tablet (5 mg total) by mouth daily.  90 tablet  1  . aspirin EC 81 MG tablet Take 81 mg by mouth daily.      . bromocriptine (PARLODEL) 2.5 MG tablet Take 1 tablet (2.5 mg total) by mouth daily.  90 tablet  3  . calcium carbonate (OS-CAL) 600 MG TABS Take 600 mg by mouth 2 (two) times daily with a meal.       . Cholecalciferol (VITAMIN D PO) Take 800 Units by mouth daily.        . hydrochlorothiazide (HYDRODIURIL) 25 MG tablet Take 1 tablet (25 mg total) by mouth daily.  90 tablet  3  . Multiple Vitamin (MULITIVITAMIN WITH MINERALS) TABS Take 1 tablet by mouth daily.      . nebivolol (BYSTOLIC) 10 MG tablet Take 5 mg by mouth daily.      . propranolol (INDERAL) 10 MG tablet Take 10 mg by mouth 4 (four) times daily as needed. For palpitations      . ramipril (ALTACE) 10 MG capsule Take 1 capsule (10 mg total) by mouth 2 (two) times daily.  180 capsule  1  . saxagliptin HCl (ONGLYZA) 5 MG TABS tablet Take 1 tablet (5 mg total) by mouth every morning.  30 tablet  11  . simvastatin (ZOCOR) 20 MG tablet Take 1 tablet (20 mg total) by mouth at bedtime.  90 tablet  1    Allergies  Allergen Reactions  . Morphine And Related Itching,  Nausea And Vomiting and Rash  . Actos (Pioglitazone Hydrochloride) Swelling  . Januvia (Sitagliptin Phosphate) Other (See Comments)    Pt says this caused elevated liver enzymes and creatinine  . Percocet (Oxycodone-Acetaminophen) Nausea And Vomiting  . Hydrocodone-Acetaminophen Itching and Nausea And Vomiting    Family History  Problem Relation Age of Onset  . Coronary artery disease Father   . Hypertension Father   . Stroke Father   . Diabetes Father   . Kidney failure Mother   . Hypertension Mother   . Other Brother     SEPSIS  . Diabetes Brother   . Diabetes Sister   . Kidney failure Sister    BP 114/80  Pulse 133  Temp 98.8 F (37.1 C) (Oral)  Resp 16  Ht 5\' 4"  (1.626 m)  Wt 188 lb (85.276 kg)  BMI 32.27 kg/m2  SpO2 97%  Review of Systems denies hypoglycemia    Objective:   Physical Exam Pulses: dorsalis pedis intact bilat.   Feet: no deformity.  no ulcer on the feet.  feet are of normal color and temp.  no edema Neuro: sensation is intact to touch on the feet.  Lab Results  Component Value Date   HGBA1C 7.4* 03/24/2012      Assessment  & Plan:  DM: Needs increased rx, if it can be done with a regimen that avoids or minimizes hypoglycemia.

## 2012-03-25 ENCOUNTER — Telehealth: Payer: Self-pay | Admitting: *Deleted

## 2012-03-25 NOTE — Telephone Encounter (Signed)
PATIENT NOTIFIED OF HGBA1C AND INCREASE IN MEDICATION, NATEGLANIDE TO 120MG  3XDAY. PATIENT UNDERSTANDS DIRECTIONS AND STATES HAS ENOUGH FOR NOW AND WILL CALL WHEN NEED REFILL.

## 2012-03-25 NOTE — Telephone Encounter (Signed)
Message copied by Elnora Morrison on Wed Mar 25, 2012  8:14 AM ------      Message from: Romero Belling      Created: Wed Mar 25, 2012  7:38 AM       please call patient:      Blood sugar is better, but still a little high      Please increase the "nateglanide" to 120 mg, 3x a day      i would be happy to send this to any pharmacy you want

## 2012-04-20 ENCOUNTER — Other Ambulatory Visit: Payer: Self-pay | Admitting: Endocrinology

## 2012-04-20 ENCOUNTER — Other Ambulatory Visit: Payer: Self-pay | Admitting: *Deleted

## 2012-04-20 MED ORDER — NATEGLINIDE 120 MG PO TABS: 120.0000 mg | ORAL_TABLET | Freq: Three times a day (TID) | ORAL | Status: DC

## 2012-04-20 MED ORDER — BROMOCRIPTINE MESYLATE 2.5 MG PO TABS: 2.5000 mg | ORAL_TABLET | Freq: Every day | ORAL | Status: DC

## 2012-04-20 NOTE — Telephone Encounter (Signed)
The patient called to request refills for Bromocriptine and Nateglinide.  The patient uses the [facility].

## 2012-04-20 NOTE — Telephone Encounter (Signed)
Med refill sent to [facility] pharmacy. Bromocriptine and Nateflinide

## 2012-04-28 ENCOUNTER — Encounter (HOSPITAL_COMMUNITY): Payer: Self-pay | Admitting: *Deleted

## 2012-04-28 ENCOUNTER — Emergency Department (HOSPITAL_COMMUNITY): Payer: Medicare Other

## 2012-04-28 ENCOUNTER — Inpatient Hospital Stay (HOSPITAL_COMMUNITY)
Admission: EM | Admit: 2012-04-28 | Discharge: 2012-04-30 | DRG: 309 | Disposition: A | Discharge status: Home / Self Care | Payer: Medicare Other | Attending: Internal Medicine | Admitting: Internal Medicine

## 2012-04-28 ENCOUNTER — Ambulatory Visit (INDEPENDENT_AMBULATORY_CARE_PROVIDER_SITE_OTHER): Payer: Medicare Other | Admitting: Internal Medicine

## 2012-04-28 ENCOUNTER — Encounter: Payer: Self-pay | Admitting: Internal Medicine

## 2012-04-28 VITALS — BP 126/70 | HR 61 | Temp 97.5°F | Wt 184.0 lb

## 2012-04-28 DIAGNOSIS — R000 Tachycardia, unspecified: Secondary | ICD-10-CM

## 2012-04-28 DIAGNOSIS — E782 Mixed hyperlipidemia: Secondary | ICD-10-CM

## 2012-04-28 DIAGNOSIS — I4891 Unspecified atrial fibrillation: Principal | ICD-10-CM | POA: Diagnosis present

## 2012-04-28 DIAGNOSIS — K922 Gastrointestinal hemorrhage, unspecified: Secondary | ICD-10-CM

## 2012-04-28 DIAGNOSIS — K5790 Diverticulosis of intestine, part unspecified, without perforation or abscess without bleeding: Secondary | ICD-10-CM | POA: Insufficient documentation

## 2012-04-28 DIAGNOSIS — I10 Essential (primary) hypertension: Secondary | ICD-10-CM | POA: Diagnosis not present

## 2012-04-28 DIAGNOSIS — Z96659 Presence of unspecified artificial knee joint: Secondary | ICD-10-CM

## 2012-04-28 DIAGNOSIS — Z7982 Long term (current) use of aspirin: Secondary | ICD-10-CM

## 2012-04-28 DIAGNOSIS — I129 Hypertensive chronic kidney disease with stage 1 through stage 4 chronic kidney disease, or unspecified chronic kidney disease: Secondary | ICD-10-CM | POA: Diagnosis present

## 2012-04-28 DIAGNOSIS — N189 Chronic kidney disease, unspecified: Secondary | ICD-10-CM | POA: Diagnosis present

## 2012-04-28 DIAGNOSIS — I4892 Unspecified atrial flutter: Secondary | ICD-10-CM

## 2012-04-28 DIAGNOSIS — K5730 Diverticulosis of large intestine without perforation or abscess without bleeding: Secondary | ICD-10-CM | POA: Diagnosis not present

## 2012-04-28 DIAGNOSIS — E11319 Type 2 diabetes mellitus with unspecified diabetic retinopathy without macular edema: Secondary | ICD-10-CM | POA: Diagnosis present

## 2012-04-28 DIAGNOSIS — K625 Hemorrhage of anus and rectum: Secondary | ICD-10-CM | POA: Diagnosis not present

## 2012-04-28 DIAGNOSIS — E1139 Type 2 diabetes mellitus with other diabetic ophthalmic complication: Secondary | ICD-10-CM | POA: Diagnosis present

## 2012-04-28 DIAGNOSIS — I517 Cardiomegaly: Secondary | ICD-10-CM | POA: Diagnosis not present

## 2012-04-28 DIAGNOSIS — E1165 Type 2 diabetes mellitus with hyperglycemia: Secondary | ICD-10-CM

## 2012-04-28 DIAGNOSIS — K559 Vascular disorder of intestine, unspecified: Secondary | ICD-10-CM | POA: Diagnosis present

## 2012-04-28 DIAGNOSIS — I5022 Chronic systolic (congestive) heart failure: Secondary | ICD-10-CM | POA: Diagnosis present

## 2012-04-28 DIAGNOSIS — E785 Hyperlipidemia, unspecified: Secondary | ICD-10-CM | POA: Diagnosis present

## 2012-04-28 DIAGNOSIS — R6889 Other general symptoms and signs: Secondary | ICD-10-CM | POA: Diagnosis not present

## 2012-04-28 DIAGNOSIS — I509 Heart failure, unspecified: Secondary | ICD-10-CM | POA: Diagnosis present

## 2012-04-28 LAB — COMPREHENSIVE METABOLIC PANEL
Albumin: 4.1 g/dL (ref 3.5–5.2)
Alkaline Phosphatase: 148 U/L — ABNORMAL HIGH (ref 39–117)
BUN: 24 mg/dL — ABNORMAL HIGH (ref 6–23)
Potassium: 4.5 mEq/L (ref 3.5–5.1)
Total Protein: 8.3 g/dL (ref 6.0–8.3)

## 2012-04-28 LAB — CBC WITH DIFFERENTIAL/PLATELET
Basophils Absolute: 0.1 K/uL (ref 0.0–0.1)
Basophils Relative: 1 % (ref 0–1)
Eosinophils Absolute: 0.1 10*3/uL (ref 0.0–0.7)
Eosinophils Relative: 1 % (ref 0–5)
HCT: 38 % (ref 36.0–46.0)
Hemoglobin: 13.1 g/dL (ref 12.0–15.0)
Lymphocytes Relative: 9 % — ABNORMAL LOW (ref 12–46)
Lymphs Abs: 0.9 K/uL (ref 0.7–4.0)
MCH: 31.9 pg (ref 26.0–34.0)
MCHC: 34.5 g/dL (ref 30.0–36.0)
MCV: 92.5 fL (ref 78.0–100.0)
Monocytes Absolute: 0.5 K/uL (ref 0.1–1.0)
Monocytes Relative: 5 % (ref 3–12)
Neutro Abs: 8 K/uL — ABNORMAL HIGH (ref 1.7–7.7)
Neutrophils Relative %: 84 % — ABNORMAL HIGH (ref 43–77)
Platelets: 326 10*3/uL (ref 150–400)
RBC: 4.11 MIL/uL (ref 3.87–5.11)
RDW: 13.6 % (ref 11.5–15.5)
WBC: 9.5 K/uL (ref 4.0–10.5)

## 2012-04-28 LAB — URINALYSIS, ROUTINE W REFLEX MICROSCOPIC
Bilirubin Urine: NEGATIVE
Glucose, UA: NEGATIVE mg/dL
Hgb urine dipstick: NEGATIVE
Ketones, ur: NEGATIVE mg/dL
Leukocytes, UA: NEGATIVE
Nitrite: NEGATIVE
Protein, ur: NEGATIVE mg/dL
Specific Gravity, Urine: 1.017 (ref 1.005–1.030)
Urobilinogen, UA: 0.2 mg/dL (ref 0.0–1.0)
pH: 5 (ref 5.0–8.0)

## 2012-04-28 LAB — TROPONIN I: Troponin I: <0.3 ng/mL (ref <0.30)

## 2012-04-28 LAB — APTT: aPTT: 48 seconds — ABNORMAL HIGH (ref 24–37)

## 2012-04-28 LAB — COMPREHENSIVE METABOLIC PANEL WITH GFR
ALT: 12 U/L (ref 0–35)
AST: 21 U/L (ref 0–37)
CO2: 25 meq/L (ref 19–32)
Calcium: 10.3 mg/dL (ref 8.4–10.5)
Chloride: 100 meq/L (ref 96–112)
Creatinine, Ser: 1.47 mg/dL — ABNORMAL HIGH (ref 0.50–1.10)
GFR calc Af Amer: 40 mL/min — ABNORMAL LOW (ref >90)
GFR calc non Af Amer: 34 mL/min — ABNORMAL LOW (ref >90)
Glucose, Bld: 177 mg/dL — ABNORMAL HIGH (ref 70–99)
Sodium: 138 meq/L (ref 135–145)
Total Bilirubin: 0.9 mg/dL (ref 0.3–1.2)

## 2012-04-28 LAB — GLUCOSE, CAPILLARY

## 2012-04-28 LAB — PROTIME-INR
INR: 1.16 (ref 0.00–1.49)
Prothrombin Time: 14.6 seconds (ref 11.6–15.2)

## 2012-04-28 LAB — OCCULT BLOOD, POC DEVICE: Fecal Occult Bld: POSITIVE — AB

## 2012-04-28 MED ORDER — SIMVASTATIN 20 MG PO TABS
20.0000 mg | ORAL_TABLET | Freq: Every day | ORAL | Status: DC
  Administered 2012-04-28: 20 mg via ORAL
  Filled 2012-04-28 (×2): qty 1

## 2012-04-28 MED ORDER — RAMIPRIL 10 MG PO CAPS
10.0000 mg | ORAL_CAPSULE | Freq: Two times a day (BID) | ORAL | Status: DC
  Administered 2012-04-28 – 2012-04-29 (×3): 10 mg via ORAL
  Filled 2012-04-28 (×5): qty 1

## 2012-04-28 MED ORDER — IOHEXOL 300 MG/ML  SOLN: 100.0000 mL | Freq: Once | INTRAMUSCULAR | Status: AC | PRN

## 2012-04-28 MED ORDER — INSULIN ASPART 100 UNIT/ML ~~LOC~~ SOLN
0.0000 [IU] | Freq: Every day | SUBCUTANEOUS | Status: DC
  Administered 2012-04-29: 2 [IU] via SUBCUTANEOUS

## 2012-04-28 MED ORDER — METOPROLOL TARTRATE 1 MG/ML IV SOLN
2.5000 mg | INTRAVENOUS | Status: DC | PRN
  Filled 2012-04-28: qty 5

## 2012-04-28 MED ORDER — CALCIUM CARBONATE 1250 (500 CA) MG PO TABS
1.0000 | ORAL_TABLET | Freq: Every day | ORAL | Status: DC
  Administered 2012-04-29 – 2012-04-30 (×2): 500 mg via ORAL
  Filled 2012-04-28 (×3): qty 1

## 2012-04-28 MED ORDER — NATEGLINIDE 120 MG PO TABS
120.0000 mg | ORAL_TABLET | Freq: Three times a day (TID) | ORAL | Status: DC
  Administered 2012-04-29 – 2012-04-30 (×4): 120 mg via ORAL
  Filled 2012-04-28 (×7): qty 1

## 2012-04-28 MED ORDER — BROMOCRIPTINE MESYLATE 2.5 MG PO TABS
2.5000 mg | ORAL_TABLET | Freq: Every day | ORAL | Status: DC
  Administered 2012-04-29: 2.5 mg via ORAL
  Filled 2012-04-28 (×3): qty 1

## 2012-04-28 MED ORDER — INSULIN ASPART 100 UNIT/ML ~~LOC~~ SOLN
0.0000 [IU] | Freq: Three times a day (TID) | SUBCUTANEOUS | Status: DC
  Administered 2012-04-29 – 2012-04-30 (×2): 2 [IU] via SUBCUTANEOUS

## 2012-04-28 MED ORDER — LINAGLIPTIN 5 MG PO TABS
5.0000 mg | ORAL_TABLET | Freq: Every day | ORAL | Status: DC
  Administered 2012-04-29: 5 mg via ORAL
  Filled 2012-04-28 (×3): qty 1

## 2012-04-28 MED ORDER — SODIUM CHLORIDE 0.9 % IV SOLN
250.0000 mL | INTRAVENOUS | Status: DC | PRN
  Administered 2012-04-30: 500 mL via INTRAVENOUS

## 2012-04-28 MED ORDER — AMLODIPINE BESYLATE 5 MG PO TABS: 5.0000 mg | ORAL_TABLET | Freq: Every day | ORAL | Status: DC

## 2012-04-28 MED ORDER — SODIUM CHLORIDE 0.9 % IV SOLN
Freq: Once | INTRAVENOUS | Status: AC
  Administered 2012-04-30: 500 mL via INTRAVENOUS

## 2012-04-28 MED ORDER — CALCIUM CARBONATE 600 MG PO TABS: 600.0000 mg | ORAL_TABLET | Freq: Every day | ORAL | Status: DC

## 2012-04-28 MED ORDER — SODIUM CHLORIDE 0.9 % IJ SOLN: 3.0000 mL | INTRAMUSCULAR | Status: DC | PRN

## 2012-04-28 MED ORDER — ONDANSETRON HCL 4 MG PO TABS: 4.0000 mg | ORAL_TABLET | Freq: Four times a day (QID) | ORAL | Status: DC | PRN

## 2012-04-28 MED ORDER — SODIUM CHLORIDE 0.9 % IJ SOLN
3.0000 mL | Freq: Two times a day (BID) | INTRAMUSCULAR | Status: DC
  Administered 2012-04-28: 3 mL via INTRAVENOUS

## 2012-04-28 MED ORDER — ADULT MULTIVITAMIN W/MINERALS CH
1.0000 | ORAL_TABLET | Freq: Every day | ORAL | Status: DC
  Administered 2012-04-29: 1 via ORAL
  Filled 2012-04-28 (×2): qty 1

## 2012-04-28 MED ORDER — ONDANSETRON HCL 4 MG/2ML IJ SOLN: 4.0000 mg | Freq: Four times a day (QID) | INTRAMUSCULAR | Status: DC | PRN

## 2012-04-28 MED ORDER — IOHEXOL 300 MG/ML  SOLN
25.0000 mL | INTRAMUSCULAR | Status: AC
  Administered 2012-04-28 (×2): 25 mL via ORAL

## 2012-04-28 MED ORDER — DILTIAZEM HCL 100 MG IV SOLR
5.0000 mg/h | INTRAVENOUS | Status: DC
  Administered 2012-04-28: 5 mg/h via INTRAVENOUS
  Filled 2012-04-28 (×2): qty 100

## 2012-04-28 MED ORDER — DILTIAZEM LOAD VIA INFUSION
10.0000 mg | Freq: Once | INTRAVENOUS | Status: DC
  Filled 2012-04-28: qty 10

## 2012-04-28 MED ORDER — METOPROLOL TARTRATE 12.5 MG HALF TABLET
12.5000 mg | ORAL_TABLET | Freq: Two times a day (BID) | ORAL | Status: DC
  Administered 2012-04-28 – 2012-04-29 (×2): 12.5 mg via ORAL
  Filled 2012-04-28 (×3): qty 1

## 2012-04-28 NOTE — Consult Note (Addendum)
CARDIOLOGY CONSULT NOTE   Patient ID: Becky Forbes MRN: [id number] DOB/AGE: 09/01/1940 72 y.o.  Admit date: 04/28/2012  Primary Physician   Marga Melnick, MD Primary Cardiologist   [place] Reason for Consultation   Rapid atrial fib  Becky Forbes is a 72 y.o. female with a history of atrial fibrillation but no CAD.  She came to the hospital from her PCPs office after complaining of weakness, diaphoresis and general malaise. She did not have palpitations (usually does). She developed rectal bleeding (BRBPR) last pm and went to see Dr Alwyn Ren today. She was in rapid atrial fib and was sent to the hospital. She was admitted by the primary team and cardiology was asked to evaluate her for the atrial fib.Currently she is asymptomatic from the atrial fib and cannot tell she is in it. She feels she is generally aware of the atrial fib but does not get palpitations that often - only once in the last couple of months. She did not have abdominal pain and has never had bleeding like this before.   Past Medical History  Diagnosis Date  . HTN (hypertension)   . Hyperlipemia   . Diabetes mellitus     x 10 yrs  . PONV (postoperative nausea and vomiting)   . Atrial fibrillation     CLEARED BY DR [facility] 05/27/11     Past Surgical History  Procedure Date  . Cataract extraction 05/25/07    RIGHT EYE  . Appendectomy   . Carpal tunnel release   . Abdominal hysterectomy   . Lumbar laminectomy   . Foot surgery 2006    MID FOOT RECONSTRUCTION  . Osteotomy 2006    AND FUSION  . Ganglion cyst excision 12/2007    Dr.Gramig  . Total knee arthroplasty 05/02/08, 07/25/08    Dr.Alusio  . Joint replacement     bilateral  . 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 Colonoscopy 01/31/2009    small external/internal hemorrhoids, diverticulosis in sigmoid colon, one small polyp (biopsied). Biospy essentially normal. Dr.Magod    Allergies  Allergen Reactions  . Morphine And Related Itching,  Nausea And Vomiting and Rash  . Actos (Pioglitazone Hydrochloride) Swelling  . Januvia (Sitagliptin Phosphate) Other (See Comments)    Pt says this caused elevated liver enzymes and creatinine  . Percocet (Oxycodone-Acetaminophen) Nausea And Vomiting  . Hydrocodone-Acetaminophen Itching and Nausea And Vomiting    I have reviewed the patient's current medications      . sodium chloride     iohexol  Prior to Admission medications   Medication Sig Start Date End Date Taking? Authorizing Provider  amLODipine (NORVASC) 5 MG tablet Take 1 tablet (5 mg total) by mouth daily. 01/14/12  Yes Pecola Lawless, MD  aspirin EC 81 MG tablet Take 81 mg by mouth daily.   Yes Historical Provider, MD  bromocriptine (PARLODEL) 2.5 MG tablet Take 1 tablet (2.5 mg total) by mouth daily. 04/20/12 04/20/13 Yes Romero Belling, MD  calcium carbonate (OS-CAL) 600 MG TABS Take 600 mg by mouth daily.    Yes Historical Provider, MD  Cholecalciferol (VITAMIN D PO) Take 800 Units by mouth daily.     Yes Historical Provider, MD  hydrochlorothiazide (HYDRODIURIL) 25 MG tablet Take 1 tablet (25 mg total) by mouth daily. 05/27/11  Yes Vesta Mixer, MD  Multiple Vitamin (MULITIVITAMIN WITH MINERALS) TABS Take 1 tablet by mouth daily.   Yes Historical Provider, MD  nateglinide (STARLIX) 120 MG  tablet Take 1 tablet (120 mg total) by mouth 3 (three) times daily before meals. 04/20/12  Yes Romero Belling, MD  nebivolol (BYSTOLIC) 10 MG tablet Take 5 mg by mouth daily. 05/27/11  Yes Vesta Mixer, MD  propranolol (INDERAL) 10 MG tablet Take 10 mg by mouth at bedtime. For palpitations 05/27/11  Yes Vesta Mixer, MD  ramipril (ALTACE) 10 MG capsule Take 10 mg by mouth 2 (two) times daily. 01/14/12  Yes Pecola Lawless, MD  saxagliptin HCl (ONGLYZA) 5 MG TABS tablet Take 5 mg by mouth daily. 05/10/11  Yes Romero Belling, MD  simvastatin (ZOCOR) 20 MG tablet Take 1 tablet (20 mg total) by mouth at bedtime. 01/14/12  Yes Pecola Lawless, MD      History   Social History  . Marital Status: Married    Spouse Name: JAMES    Number of Children: 2  . Years of Education: N/A   Occupational History  . 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 Smoking status: Never Smoker   . Smokeless tobacco: Not on file  . Alcohol Use: No  . Drug Use: No  . Sexually Active: Not on file   Other Topics Concern  . Not on file   Social History Narrative  . No narrative on file     Family History  Problem Relation Age of Onset  . Coronary artery disease Father   . Hypertension Father   . Stroke Father   . Diabetes Father   . Kidney failure Mother   . Hypertension Mother   . Other Brother     SEPSIS  . Diabetes Brother   . Diabetes Sister   . Kidney failure Sister      ROS: Rare headaches, no history of presyncope or syncope. No recent illnesses, no history of bleeding. Full 14 point review of systems complete and found to be negative unless listed above.  Physical Exam: Blood pressure 120/74, pulse 143, temperature 97.5 F (36.4 C), temperature source Oral, resp. rate 19, height 5\' 4"  (1.626 m), weight 182 lb (82.555 kg), SpO2 98.00%.  General: Well developed, well nourished, female in no acute distress Head: Eyes PERRLA, No xanthomas.   Normocephalic and atraumatic, oropharynx without edema or exudate. Dentition: good Lungs: Clear bilaterally Heart: Heart irregular rate and rhythm with S1, S2; no murmur. pulses are 2+ all 4 extrem.   Neck: No carotid bruits. No lymphadenopathy.  JVD not elevated. Abdomen: Bowel sounds present, abdomen soft and non-tender without masses or hernias noted. Msk:  No spine or cva tenderness. No weakness, no joint deformities or effusions. Extremities: No clubbing or cyanosis. No edema.  Neuro: Alert and oriented X 3. No focal deficits noted. Psych:  Good affect, responds appropriately Skin: No rashes or lesions noted.  Labs:   Lab Results    Component Value Date   WBC 9.5 04/28/2012   HGB 13.1 04/28/2012   HCT 38.0 04/28/2012   MCV 92.5 04/28/2012   PLT 326 04/28/2012    Basename 04/28/12 1330  INR 1.16    Lab 04/28/12 1330  NA 138  K 4.5  CL 100  CO2 25  BUN 24*  CREATININE 1.47*  CALCIUM 10.3  PROT 8.3  BILITOT 0.9  ALKPHOS 148*  ALT 12  AST 21  GLUCOSE 177*    Basename 04/28/12 1330  CKTOTAL --  CKMB --  TROPONINI <0.30   TSH  Date/Time Value Range Status  06/26/2009  7:58 AM 3.98  0.35-5.50 microintl units/mL Final    Echo: EF OK in 2009  ECG: 28-Apr-2012 13:51:05 [facility]/ED ROUTINE RECORD A-FLUTTER W/ PREDOM 4:1 AV BLOCK, A-RATE 283 ~ A-rate 220-340, multiple Ps INCOMPLETE RIGHT BUNDLE BRANCH BLOCK ~ QRSd >105, terminal axis(90,270) Standard 12 Lead Report ~ Unconfirmed Interpretation Abnormal ECG 15mm/s 29mm/mV 150Hz  8.0.1 12SL 235 CID: [id number] Referred by: Unconfirmed Vent. rate 71 BPM PR interval * ms QRS duration 112 ms QT/QTc 404/439 ms P-R-T axes * -29 65  Radiology:  Dg Chest Port 1 View 04/28/2012  *RADIOLOGY REPORT*  Clinical Data: Rectal bleeding.  PORTABLE CHEST - 1 VIEW  Comparison: 06/03/2011  Findings: Mild cardiomegaly.  Lungs are clear.  No effusions.  No acute bony abnormality.  IMPRESSION: Mild cardiomegaly.  No acute findings.   Original Report Authenticated By: Charlett Nose, M.D.     ASSESSMENT AND PLAN:   The patient was seen today by Dr Patty Sermons, the patient evaluated and the data reviewed.   Principal Problem:  *PAROXYSMAL ATRIAL FIBRILLATION - unclear duration as she seems to mainly have had symptoms from the LGI bleed. Will start IV Cardizem, but no anticoagulation because of the bleeding. Continue bystolic. Since duration is unclear, pursue rate control.  Otherwise, per primary MD. 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 the patient presented to Dr. Frederik Pear office today because of her rectal bleeding which started yesterday afternoon.  The patient was not aware that she was in atrial flutter fibrillation today.  Most of the time she is aware of when she is in atrial fib.  She has not been experiencing any chest pain or shortness of breath.  She has not had any dizziness or syncope today.  Yesterday after taking a shower she did have sudden onset of profound weakness associated with profuse diaphoresis.  It was later that day that the hematochezia was apparent.  The patient has had known diverticulosis and is followed by Dr. Ewing Schlein. Physical findings tonight include the fact that the patient is in no distress.  Her lungs are clear.  Heart reveals no murmur gallop rub or click.  Abdomen is soft and nontender.  The extremities show no phlebitis or edema  And pedal pulses are strong.  Her EKG shows atrial flutter with a variable ventricular response. Our plan will be rate control with IV Cardizem.  She states that normally her arrhythmia is short lived.  We cannot anticoagulate because of active GI bleeding.  As we start IV Cardizem we are stopping amlodipine.  Her last echocardiogram was 5 years ago so we will plan to repeat her echocardiogram. Agree with assessment and plan as noted above.

## 2012-04-28 NOTE — Progress Notes (Signed)
  Subjective:    Patient ID: Becky Forbes, female    DOB: 08-Feb-1941, 72 y.o.   MRN: [id number]  HPI  BRIGHT RED RECTAL BLEEDING with ABDOMINAL PAIN  Onset: 5:30 pm after experiencing abd pressure Character: 1 tsp every 3 hrs 1/13-today Location: mid abd diffusely   Radiation: no  Severity: up to 5 Quality: dull, cramping  Duration: intermittent since 1/13 , up to 60 min  Better with: no relievers  Worse with: no exacerbating factors    Past Surgeries: "focal colitis ", Tics, diminuitive polyp  01/31/09 , Dr Ewing Schlein       Review of Systems Nausea/Vomiting: nausea & near syncope 1/13 after showering 8:30 am Diarrhea: liquid stool intermittently since 12/13 Constipation: also intermittent since 12/13 Melena: no Hematemesis: no  Anorexia:x 1 month Fever/Chills/sweats: "stay cold" Dysuria/hematura/pyuria:no Rash:no Wt loss: 1-2# since 12/13  EtOH use: no NSAIDs/ASA: no Vaginal bleeding: no  She denies epistaxis or  Hemoptysis;she has no unexplained weight loss or dysphagia.She has no abnormal bruising or bleeding. She has no difficulty stopping bleeding with injury.  Despite tachyarrhythmia she denies chest pain or  shortness of breath      Objective:   Physical Exam General appearance is one of good health and nourishment w/o distress.  Eyes: No conjunctival inflammation or scleral icterus is present.  Oral exam: Upper plate; lips and gums are healthy appearing.There is no oropharyngeal erythema or exudate noted.   Heart:  Very rapid ( > 120) rate and IRREGULAR  Rhythm with flow  Murmur.  Lungs:Chest clear to auscultation; no wheezes, rhonchi,rales ,or rubs present.No increased work of breathing.   Abdomen: bowel sounds normal, soft and non-tender without masses, organomegaly or hernias noted.  No guarding or rebound   Skin:Warm & dry.  Intact without suspicious lesions or rashes ; no jaundice. Minimal tenting  Lymphatic: No lymphadenopathy is noted about the head,  neck, axilla              Assessment & Plan:  #1 recurrent rectal bleeding in the context of a previous history of colitis ,diverticulosis and diminutive polyp. Colitis would be of greatest concern in view of symptoms alternating consultation diarrhea since December 2013. Hemodynamically she is stable but of concern is her tachyarrhythmia.  #2 tachyarrhythmia, A flutter -fib @ rate of 135.  Plan: She needs to be admitted for rate control and further evaluation of rectal bleeding  & verification of stable hemodynamics  ER charge nurse contacted.

## 2012-04-28 NOTE — ED Provider Notes (Signed)
Care assumed at sign out. Becky Forbes is a 72 y.o. female here with rectal bleed and afib. Guiac positive here, H/h stable. CT ab/pel ordered but Cr 1.47 so she refused CT. I talked to hospitalist, who will observe her. I recommend getting inpatient GI consult.    Richardean Canal, MD 04/28/12 [phone]

## 2012-04-28 NOTE — H&P (Signed)
[facility] History and Physical  Becky Forbes ZOX:[id number] DOB: June 17, 1940 DOA: 04/28/2012  Referring physician: er PCP: Marga Melnick, MD  Specialists: cardiology  Chief Complaint: blood in stool and rapid HR  HPI: Becky Forbes is a 72 y.o. female  Who was sent from her PCP with a rapid HR.  She had no symptoms like she normally does to know her heart is out of rhythm.  +weak and tired. Patient has H/o a fib (parox)-only on asa and low dose BB.   She had gone to her PCP with rectal bleeding- BRB and about a tablespoon with each BM.  No fever, no chills, no CP, no SOB Bleeding started yesterday PM.  Mild abdominal pain. No nausea, no vomiting.  In the ER, she was found to have a fib with RVR- occasionally flipping to sinus rate 60-80.  They attempted to get a CT scan of abd but patient refused because of her kidneys.  She was guaiac + in the ER and hospitalist were called for admission.   + diarrhea on and off since december Last colonoscopy was a few years ago by Dr. Sharol Roussel   Review of Systems: all systems reviewed, negative unless stated above  Past Medical History  Diagnosis Date  . HTN (hypertension)   . Hyperlipemia   . Diabetes mellitus     x 10 yrs  . PONV (postoperative nausea and vomiting)   . Atrial fibrillation     CLEARED BY DR [facility] 05/27/11   Past Surgical History  Procedure Date  . Cataract extraction 05/25/07    RIGHT EYE  . Appendectomy   . Carpal tunnel release   . Abdominal hysterectomy   . Lumbar laminectomy   . Foot surgery 2006    MID FOOT RECONSTRUCTION  . Osteotomy 2006    AND FUSION  . Ganglion cyst excision 12/2007    Dr.Gramig  . Total knee arthroplasty 05/02/08, 07/25/08    Dr.Alusio  . Joint replacement     bilateral  . 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 Colonoscopy 01/31/2009    small external/internal hemorrhoids, diverticulosis in sigmoid colon, one small polyp (biopsied). Biospy essentially normal. Dr.Magod    Social History:  reports that she has never smoked. She does not have any smokeless tobacco history on file. She reports that she does not drink alcohol or use illicit drugs.   Allergies  Allergen Reactions  . Morphine And Related Itching, Nausea And Vomiting and Rash  . Actos (Pioglitazone Hydrochloride) Swelling  . Januvia (Sitagliptin Phosphate) Other (See Comments)    Pt says this caused elevated liver enzymes and creatinine  . Percocet (Oxycodone-Acetaminophen) Nausea And Vomiting  . Hydrocodone-Acetaminophen Itching and Nausea And Vomiting    Family History  Problem Relation Age of Onset  . Coronary artery disease Father   . Hypertension Father   . Stroke Father   . Diabetes Father   . Kidney failure Mother   . Hypertension Mother   . Other Brother     SEPSIS  . Diabetes Brother   . Diabetes Sister   . Kidney failure Sister     Prior to Admission medications   Medication Sig Start Date End Date Taking? Authorizing Provider  amLODipine (NORVASC) 5 MG tablet Take 1 tablet (5 mg total) by mouth daily. 01/14/12  Yes Pecola Lawless, MD  aspirin EC 81 MG tablet Take 81 mg by mouth daily.   Yes Historical Provider, MD  bromocriptine (PARLODEL)  2.5 MG tablet Take 1 tablet (2.5 mg total) by mouth daily. 04/20/12 04/20/13 Yes Romero Belling, MD  calcium carbonate (OS-CAL) 600 MG TABS Take 600 mg by mouth daily.    Yes Historical Provider, MD  Cholecalciferol (VITAMIN D PO) Take 800 Units by mouth daily.     Yes Historical Provider, MD  hydrochlorothiazide (HYDRODIURIL) 25 MG tablet Take 1 tablet (25 mg total) by mouth daily. 05/27/11  Yes Vesta Mixer, MD  Multiple Vitamin (MULITIVITAMIN WITH MINERALS) TABS Take 1 tablet by mouth daily.   Yes Historical Provider, MD  nateglinide (STARLIX) 120 MG tablet Take 1 tablet (120 mg total) by mouth 3 (three) times daily before meals. 04/20/12  Yes Romero Belling, MD  nebivolol (BYSTOLIC) 10 MG tablet Take 5 mg by mouth daily. 05/27/11  Yes  Vesta Mixer, MD  propranolol (INDERAL) 10 MG tablet Take 10 mg by mouth at bedtime. For palpitations 05/27/11  Yes Vesta Mixer, MD  ramipril (ALTACE) 10 MG capsule Take 10 mg by mouth 2 (two) times daily. 01/14/12  Yes Pecola Lawless, MD  saxagliptin HCl (ONGLYZA) 5 MG TABS tablet Take 5 mg by mouth daily. 05/10/11  Yes Romero Belling, MD  simvastatin (ZOCOR) 20 MG tablet Take 1 tablet (20 mg total) by mouth at bedtime. 01/14/12  Yes Pecola Lawless, MD   Physical Exam: Filed Vitals:   04/28/12 1630 04/28/12 1645 04/28/12 1700 04/28/12 1715  BP: 109/85 130/65 134/87 139/91  Pulse: 80 64  139  Temp:      TempSrc:      Resp: 25 15  16   Height:      Weight:      SpO2: 98% 99%  100%     General:  A+Ox3, NAD  Eyes: wnl  ENT: wnl  Neck: supple  Cardiovascular: irregular  Respiratory: clear anterior, no wheezing  Abdomen: +BS, mild abdominal tenderness  Skin: no rashes or lesions  Musculoskeletal: moves all 4 extremities  Psychiatric: no SI/no HI  Neurologic: CN 2-12 intact  Labs on Admission:  Basic Metabolic Panel:  Lab 04/28/12 4540  NA 138  K 4.5  CL 100  CO2 25  GLUCOSE 177*  BUN 24*  CREATININE 1.47*  CALCIUM 10.3  MG --  PHOS --   Liver Function Tests:  Lab 04/28/12 1330  AST 21  ALT 12  ALKPHOS 148*  BILITOT 0.9  PROT 8.3  ALBUMIN 4.1   No results found for this basename: LIPASE:5,AMYLASE:5 in the last 168 hours No results found for this basename: AMMONIA:5 in the last 168 hours CBC:  Lab 04/28/12 1330  WBC 9.5  NEUTROABS 8.0*  HGB 13.1  HCT 38.0  MCV 92.5  PLT 326   Cardiac Enzymes:  Lab 04/28/12 1330  CKTOTAL --  CKMB --  CKMBINDEX --  TROPONINI <0.30    BNP (last 3 results) No results found for this basename: PROBNP:3 in the last 8760 hours CBG: No results found for this basename: GLUCAP:5 in the last 168 hours  Radiological Exams on Admission: Dg Chest Port 1 View  04/28/2012  *RADIOLOGY REPORT*  Clinical Data:  Rectal bleeding.  PORTABLE CHEST - 1 VIEW  Comparison: 06/03/2011  Findings: Mild cardiomegaly.  Lungs are clear.  No effusions.  No acute bony abnormality.  IMPRESSION: Mild cardiomegaly.  No acute findings.   Original Report Authenticated By: Charlett Nose, M.D.     EKG: Independently reviewed. 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 Has had scope few years ago by Dr. Sharol Roussel- consult in the AM. Hgb stable- bleeding decreased in ER .  #2 tachyarrhythmia, A flutter -fib/h/o paroxy a fib: will consult cardiology ([facility]- Dr. Carney Harder) as patient going in and out of a fib at a rapid rate- is usually NSR, check CE, low dose BB  #3: DM- SSI  #4 HTN- stable  #5 CKD- at baseline 1.5  #6 h/o diverticulosis   Cardiology called by me in the ER  Code Status: full Family Communication: husband at bedside Disposition Plan: 2-3 days  Time spent: 70 min  Becky Forbes [facility] Pager [phone]  If 7PM-7AM, please contact night-coverage [email or web address] Password [facility] 04/28/2012, 6:51 PM

## 2012-04-28 NOTE — ED Notes (Signed)
Heart Healthy tray ordered.  

## 2012-04-28 NOTE — ED Provider Notes (Signed)
History     CSN: [id number]  Arrival date & time 04/28/12  1253   First MD Initiated Contact with Patient 04/28/12 1259      Chief Complaint  Patient presents with  . Atrial Fibrillation  . Rectal Bleeding    (Consider location/radiation/quality/duration/timing/severity/associated sxs/prior treatment) HPI Comments: Patient reports that she felt weak and tired all day yesterday, at times had felt lightheaded enough that she might pass out but never did. This was not associated with significant shortness of breath or any chest pain. She has a history of paroxysmal atrial fibrillation for which he takes a baby aspirin. She reports normally she can tell she is in A. fib or not but was sent here from her physician's office due to a combination of likely fast atrial fibrillation as well as rectal bleeding. She reports she's had numerous bowel movements since 5 yesterday now with very little stool production and just bright red blood. She denies any lightheadedness currently. Again no shortness of breath or chest pain. She reports that she's not on Coumadin or Plavix. She did have a colonoscopy several years ago and told that she had some polyps and diverticulosis. She denies any fever or chills. She reports a discomfort across her lower abdomen that she does not describe his pain. She denies any rectal pain, history of hemorrhoids, constipation. She reports appetite has been reasonable. Her primary care physician is Dr. Alwyn Ren and she also sees Dr. Elease Hashimoto or for her atrial fibrillation.    Patient is a 72 y.o. female presenting with atrial fibrillation and hematochezia. The history is provided by the patient and the spouse.  Atrial Fibrillation Associated symptoms include abdominal pain. Pertinent negatives include no chest pain, no headaches and no shortness of breath.  Rectal Bleeding  Associated symptoms include abdominal pain. Pertinent negatives include no fever, no diarrhea, no nausea, no  rectal pain, no vomiting, no chest pain and no headaches.    Past Medical History  Diagnosis Date  . HTN (hypertension)   . Hyperlipemia   . Diabetes mellitus     x 10 yrs  . PONV (postoperative nausea and vomiting)   . Atrial fibrillation     CLEARED BY DR [facility] 05/27/11    Past Surgical History  Procedure Date  . Cataract extraction 05/25/07    RIGHT EYE  . Appendectomy   . Carpal tunnel release   . Abdominal hysterectomy   . Lumbar laminectomy   . Foot surgery 2006    MID FOOT RECONSTRUCTION  . Osteotomy 2006    AND FUSION  . Ganglion cyst excision 12/2007    Dr.Gramig  . Total knee arthroplasty 05/02/08, 07/25/08    Dr.Alusio  . Joint replacement     bilateral  . 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 Colonoscopy 01/31/2009    small external/internal hemorrhoids, diverticulosis in sigmoid colon, one small polyp (biopsied). Biospy essentially normal. Dr.Magod    Family History  Problem Relation Age of Onset  . Coronary artery disease Father   . Hypertension Father   . Stroke Father   . Diabetes Father   . Kidney failure Mother   . Hypertension Mother   . Other Brother     SEPSIS  . Diabetes Brother   . Diabetes Sister   . Kidney failure Sister     History  Substance Use Topics  . Smoking status: Never Smoker   . Smokeless tobacco: Not on file  . Alcohol Use: No  OB History    Grav Para Term Preterm Abortions TAB SAB Ect Mult Living                  Review of Systems  Constitutional: Positive for fatigue. Negative for fever and chills.  Respiratory: Negative for chest tightness and shortness of breath.   Cardiovascular: Negative for chest pain.  Gastrointestinal: Positive for abdominal pain, blood in stool, hematochezia and anal bleeding. Negative for nausea, vomiting, diarrhea, constipation and rectal pain.  Neurological: Positive for weakness and light-headedness. Negative for syncope, speech difficulty and headaches.  All other  systems reviewed and are negative.    Allergies  Morphine and related; Actos; Januvia; Percocet; and Hydrocodone-acetaminophen  Home Medications   Current Outpatient Rx  Name  Route  Sig  Dispense  Refill  . AMLODIPINE BESYLATE 5 MG PO TABS   Oral   Take 1 tablet (5 mg total) by mouth daily.   90 tablet   1   . ASPIRIN EC 81 MG PO TBEC   Oral   Take 81 mg by mouth daily.         Marland Kitchen BROMOCRIPTINE MESYLATE 2.5 MG PO TABS   Oral   Take 1 tablet (2.5 mg total) by mouth daily.   90 tablet   3   . CALCIUM CARBONATE 600 MG PO TABS   Oral   Take 600 mg by mouth daily.          Marland Kitchen VITAMIN D PO   Oral   Take 800 Units by mouth daily.           Marland Kitchen HYDROCHLOROTHIAZIDE 25 MG PO TABS   Oral   Take 1 tablet (25 mg total) by mouth daily.   90 tablet   3   . ADULT MULTIVITAMIN W/MINERALS CH   Oral   Take 1 tablet by mouth daily.         Marland Kitchen NATEGLINIDE 120 MG PO TABS   Oral   Take 1 tablet (120 mg total) by mouth 3 (three) times daily before meals.   180 tablet   3   . NEBIVOLOL HCL 10 MG PO TABS   Oral   Take 5 mg by mouth daily.         Marland Kitchen PROPRANOLOL HCL 10 MG PO TABS   Oral   Take 10 mg by mouth at bedtime. For palpitations         . RAMIPRIL 10 MG PO CAPS   Oral   Take 1 capsule (10 mg total) by mouth 2 (two) times daily.   180 capsule   1   . SAXAGLIPTIN HCL 5 MG PO TABS   Oral   Take 1 tablet (5 mg total) by mouth every morning.   30 tablet   11   . SIMVASTATIN 20 MG PO TABS   Oral   Take 1 tablet (20 mg total) by mouth at bedtime.   90 tablet   1     BP 137/86  Pulse 142  Temp 97.5 F (36.4 C) (Oral)  Resp 16  Ht 5\' 4"  (1.626 m)  Wt 182 lb (82.555 kg)  BMI 31.24 kg/m2  SpO2 100%  Physical Exam  Nursing note and vitals reviewed. Constitutional: She appears well-developed and well-nourished.  HENT:  Head: Normocephalic and atraumatic.  Eyes: Pupils are equal, round, and reactive to light. No scleral icterus.  Neck: Normal range  of motion. Neck supple.  Cardiovascular: S1 normal.  An irregularly irregular  rhythm present. Frequent extrasystoles are present. Tachycardia present.   No murmur heard. Pulses:      Radial pulses are 2+ on the right side, and 2+ on the left side.  Pulmonary/Chest: Effort normal. No respiratory distress. She has no wheezes.  Abdominal: Normal appearance. She exhibits no distension. There is no tenderness.  Neurological: She is alert.  Skin: Skin is warm.    ED Course  Procedures (including critical care time)  Labs Reviewed  OCCULT BLOOD, POC DEVICE - Abnormal; Notable for the following:    Fecal Occult Bld POSITIVE (*)     All other components within normal limits  CBC WITH DIFFERENTIAL  COMPREHENSIVE METABOLIC PANEL  URINALYSIS, ROUTINE W REFLEX MICROSCOPIC  APTT  PROTIME-INR  TROPONIN I   No results found.   No diagnosis found.   Sat on [place] O2 is 100% which I interpret to be adequate.  EKG performed at time 13:04 shows atrial flutter at a rate of 140, left axis deviation, poor R-wave progression, repolarization abnormality which is likely rate related.   2:07 PM PT's rate spontaneously improved to 71, still in atrial flutter.  Will hold on metoprolol, obtain CT Scan.     4:24 PM Heart rate remains in normal limits. Hemoglobin is stable. Awaiting CT scan. I will sign out the patient to Dr. Silverio Lay.   Impression: Rectal bleeding Atrial flutter Abdominal pain  MDM   Patient reports feeling lightheaded and weak since yesterday. I suspect she has been in atrial fibrillation with RVR since that time frame. She has no chest pain and denies any current shortness of breath. Her blood pressure is stable at this time despite her tachycardic rate. She also has heme positive stool from below. Minimal abdominal tenderness, plan is to get a CT scan of abdomen and pelvis to assess for mass or diverticulitis. Since she does not have significant chest pain, I doubt this is acute  coronary syndrome and with her prior history of atrial fibrillation, paroxysmally, she has again atrial fib with RVR. Likely she will require admission. My plan since she is on propanolol baseline, is to give her IV metoprolol and reassess if she becomes more rate control. Alternatively I will start a diltiazem drip if that is not effective.        Gavin Pound. Oletta Lamas, MD 04/29/12 0865

## 2012-04-28 NOTE — Patient Instructions (Signed)
Review and correct the record as indicated. Please share record with all medical staff seen. Review and correct the record as indicated. Please share record with all medical staff seen.

## 2012-04-28 NOTE — ED Notes (Signed)
Cardizem gtt not started due to patient's drop in BP. Receiving RN advised.

## 2012-04-28 NOTE — ED Notes (Signed)
Pt sent from PCP per [facility] with complaint of Afib and rectal bleeding.  Pt states she has had bright red rectal bleeding since yesterday.  Pt advises it is probably a tablespoon amount with each bowel movement.  PT denies any pain at present.

## 2012-04-29 ENCOUNTER — Encounter (HOSPITAL_COMMUNITY): Payer: Self-pay | Admitting: General Practice

## 2012-04-29 DIAGNOSIS — Z7982 Long term (current) use of aspirin: Secondary | ICD-10-CM | POA: Diagnosis not present

## 2012-04-29 DIAGNOSIS — K922 Gastrointestinal hemorrhage, unspecified: Secondary | ICD-10-CM | POA: Diagnosis not present

## 2012-04-29 DIAGNOSIS — I4892 Unspecified atrial flutter: Secondary | ICD-10-CM

## 2012-04-29 DIAGNOSIS — I059 Rheumatic mitral valve disease, unspecified: Secondary | ICD-10-CM

## 2012-04-29 DIAGNOSIS — N189 Chronic kidney disease, unspecified: Secondary | ICD-10-CM | POA: Diagnosis not present

## 2012-04-29 DIAGNOSIS — K55059 Acute (reversible) ischemia of intestine, part and extent unspecified: Secondary | ICD-10-CM | POA: Diagnosis not present

## 2012-04-29 DIAGNOSIS — E1165 Type 2 diabetes mellitus with hyperglycemia: Secondary | ICD-10-CM

## 2012-04-29 DIAGNOSIS — K559 Vascular disorder of intestine, unspecified: Secondary | ICD-10-CM | POA: Diagnosis not present

## 2012-04-29 DIAGNOSIS — K921 Melena: Secondary | ICD-10-CM | POA: Diagnosis not present

## 2012-04-29 DIAGNOSIS — Z96659 Presence of unspecified artificial knee joint: Secondary | ICD-10-CM | POA: Diagnosis not present

## 2012-04-29 DIAGNOSIS — K5730 Diverticulosis of large intestine without perforation or abscess without bleeding: Secondary | ICD-10-CM | POA: Diagnosis present

## 2012-04-29 DIAGNOSIS — K5289 Other specified noninfective gastroenteritis and colitis: Secondary | ICD-10-CM | POA: Diagnosis not present

## 2012-04-29 DIAGNOSIS — I129 Hypertensive chronic kidney disease with stage 1 through stage 4 chronic kidney disease, or unspecified chronic kidney disease: Secondary | ICD-10-CM | POA: Diagnosis not present

## 2012-04-29 DIAGNOSIS — E785 Hyperlipidemia, unspecified: Secondary | ICD-10-CM | POA: Diagnosis not present

## 2012-04-29 DIAGNOSIS — I5022 Chronic systolic (congestive) heart failure: Secondary | ICD-10-CM | POA: Diagnosis not present

## 2012-04-29 DIAGNOSIS — I509 Heart failure, unspecified: Secondary | ICD-10-CM | POA: Diagnosis not present

## 2012-04-29 DIAGNOSIS — I4891 Unspecified atrial fibrillation: Secondary | ICD-10-CM | POA: Diagnosis not present

## 2012-04-29 DIAGNOSIS — I10 Essential (primary) hypertension: Secondary | ICD-10-CM

## 2012-04-29 DIAGNOSIS — E11319 Type 2 diabetes mellitus with unspecified diabetic retinopathy without macular edema: Secondary | ICD-10-CM | POA: Diagnosis present

## 2012-04-29 DIAGNOSIS — E1139 Type 2 diabetes mellitus with other diabetic ophthalmic complication: Secondary | ICD-10-CM | POA: Diagnosis present

## 2012-04-29 DIAGNOSIS — I517 Cardiomegaly: Secondary | ICD-10-CM | POA: Diagnosis not present

## 2012-04-29 LAB — CBC
HCT: 36 % (ref 36.0–46.0)
MCHC: 34.4 g/dL (ref 30.0–36.0)
MCV: 92.1 fL (ref 78.0–100.0)
Platelets: 281 10*3/uL (ref 150–400)
RDW: 13.8 % (ref 11.5–15.5)

## 2012-04-29 LAB — GLUCOSE, CAPILLARY
Glucose-Capillary: 149 mg/dL — ABNORMAL HIGH (ref 70–99)
Glucose-Capillary: 79 mg/dL (ref 70–99)

## 2012-04-29 LAB — HEMOGLOBIN A1C
Hgb A1c MFr Bld: 7.6 % — ABNORMAL HIGH (ref <5.7)
Mean Plasma Glucose: 171 mg/dL — ABNORMAL HIGH (ref <117)

## 2012-04-29 LAB — BASIC METABOLIC PANEL
BUN: 25 mg/dL — ABNORMAL HIGH (ref 6–23)
Calcium: 10 mg/dL (ref 8.4–10.5)
Chloride: 98 mEq/L (ref 96–112)
Creatinine, Ser: 1.6 mg/dL — ABNORMAL HIGH (ref 0.50–1.10)
GFR calc Af Amer: 36 mL/min — ABNORMAL LOW (ref >90)

## 2012-04-29 LAB — TSH: TSH: 5.1 u[IU]/mL — ABNORMAL HIGH (ref 0.350–4.500)

## 2012-04-29 LAB — TROPONIN I: Troponin I: <0.3 ng/mL (ref <0.30)

## 2012-04-29 MED ORDER — OFF THE BEAT BOOK
Freq: Once | Status: AC
  Administered 2012-04-29: 16:00:00
  Filled 2012-04-29: qty 1

## 2012-04-29 MED ORDER — ATORVASTATIN CALCIUM 10 MG PO TABS
10.0000 mg | ORAL_TABLET | Freq: Every day | ORAL | Status: DC
  Administered 2012-04-29: 10 mg via ORAL
  Filled 2012-04-29 (×2): qty 1

## 2012-04-29 MED ORDER — METOPROLOL TARTRATE 25 MG PO TABS
25.0000 mg | ORAL_TABLET | Freq: Two times a day (BID) | ORAL | Status: DC
  Administered 2012-04-29: 25 mg via ORAL
  Filled 2012-04-29 (×3): qty 1

## 2012-04-29 MED ORDER — DILTIAZEM HCL ER COATED BEADS 180 MG PO CP24
180.0000 mg | ORAL_CAPSULE | Freq: Every day | ORAL | Status: DC
  Administered 2012-04-29: 180 mg via ORAL
  Filled 2012-04-29: qty 1

## 2012-04-29 MED ORDER — DILTIAZEM HCL 100 MG IV SOLR
5.0000 mg/h | INTRAVENOUS | Status: DC
  Filled 2012-04-29: qty 100

## 2012-04-29 NOTE — Progress Notes (Signed)
PROGRESS NOTE  Subjective:   Becky Forbes is a 72 yo with hx of Atrial Flutter,  She has an active GI bleed. She denies any chest pain or dyspnea.   Objective:    Vital Signs:   Temp:  [97.5 F (36.4 C)-98.1 F (36.7 C)] 98.1 F (36.7 C) (01/15 0447) Pulse Rate:  [49-146] 69  (01/15 0447) Resp:  [13-26] 18  (01/15 0447) BP: (97-139)/(61-93) 105/68 mmHg (01/15 0447) SpO2:  [96 %-100 %] 97 % (01/15 0447) Weight:  [181 lb 9.6 oz (82.373 kg)-184 lb (83.462 kg)] 181 lb 9.6 oz (82.373 kg) (01/14 2100)  Last BM Date: 04/28/12   24-hour weight change: Weight change:   Weight trends: Filed Weights   04/28/12 1257 04/28/12 2100  Weight: 182 lb (82.555 kg) 181 lb 9.6 oz (82.373 kg)    Intake/Output:        Physical Exam: BP 105/68  Pulse 69  Temp 98.1 F (36.7 C) (Oral)  Resp 18  Ht 5\' 4"  (1.626 m)  Wt 181 lb 9.6 oz (82.373 kg)  BMI 31.17 kg/m2  SpO2 97%  General: Vital signs reviewed and noted.   Head: Normocephalic, atraumatic.  Eyes: conjunctivae/corneas clear.  EOM's intact.   Throat: normal  Neck:  normal  Lungs:    clear  Heart:  RR mostly, occasional irregularities ( rate controled A-Flutter)  Abdomen:  Soft, non-tender, non-distended    Extremities: No edema   Neurologic: A&O X3, CN II - XII are grossly intact.   Psych: Normal     Labs: BMET:  Basename 04/29/12 0240 04/28/12 1330  NA 135 138  K 3.8 4.5  CL 98 100  CO2 26 25  GLUCOSE 175* 177*  BUN 25* 24*  CREATININE 1.60* 1.47*  CALCIUM 10.0 10.3  MG -- --  PHOS -- --    Liver function tests:  Basename 04/28/12 1330  AST 21  ALT 12  ALKPHOS 148*  BILITOT 0.9  PROT 8.3  ALBUMIN 4.1   No results found for this basename: LIPASE:2,AMYLASE:2 in the last 72 hours  CBC:  Basename 04/29/12 0240 04/28/12 1330  WBC 7.7 9.5  NEUTROABS -- 8.0*  HGB 12.4 13.1  HCT 36.0 38.0  MCV 92.1 92.5  PLT 281 326    Cardiac Enzymes:  Basename 04/29/12 0240 04/28/12 2123 04/28/12 1330  CKTOTAL --  -- --  CKMB -- -- --  TROPONINI <0.30 <0.30 <0.30    Coagulation Studies:  Basename 04/28/12 1330  LABPROT 14.6  INR 1.16    Other: No components found with this basename: POCBNP:3 No results found for this basename: DDIMER in the last 72 hours  Basename 04/28/12 2123  HGBA1C 7.6*   No results found for this basename: CHOL,HDL,LDLCALC,TRIG,CHOLHDL in the last 72 hours  Basename 04/28/12 2123  TSH 5.100*  T4TOTAL --  T3FREE --  THYROIDAB --   No results found for this basename: VITAMINB12,FOLATE,FERRITIN,TIBC,IRON,RETICCTPCT in the last 72 hours   Other results:  Medications:    Infusions:    . diltiazem (CARDIZEM) infusion 10 mg/hr (04/29/12 0441)    Scheduled Medications:    . sodium chloride   Intravenous Once  . bromocriptine  2.5 mg Oral Daily  . calcium carbonate  1 tablet Oral Q breakfast  . diltiazem  10 mg Intravenous Once  . insulin aspart  0-15 Units Subcutaneous TID WC  . insulin aspart  0-5 Units Subcutaneous QHS  . linagliptin  5 mg Oral Daily  . metoprolol tartrate  12.5 mg Oral BID  . multivitamin with minerals  1 tablet Oral Daily  . nateglinide  120 mg Oral TID AC  . ramipril  10 mg Oral BID  . simvastatin  20 mg Oral QHS  . sodium chloride  3 mL Intravenous Q12H  . sodium chloride  3 mL Intravenous Q12H    Tele:  Atrial flutter with controlled v response  Assessment/ Plan:    PAROXYSMAL ATRIAL FIBRILLATION (07/03/2009) She is currently in atrial flutter with a controlled ventricular response. We'll not be able to anticoagulate her because she is having an active GI bleed.    Atrial flutter: The patient currently has very well controlled atrial flutter. We'll convert her amlodipine to diltiazem. I would anticipate that she would go home on diltiazem. We will start changing the IV Cardizem to by mouth Cardizem.  HYPERTENSION (07/03/2009)  her blood pressure is currently stable. We will change her amlodipine to 2 diltiazem for better  rate control.   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    GI bleed (04/28/2012)   CKD (chronic kidney disease) (04/28/2012)    Disposition:  Length of Stay: 1  Vesta Mixer, Montez Hageman., MD, [facility] 04/29/2012, 9:25 AM Office [phone] Pager [phone]

## 2012-04-29 NOTE — Progress Notes (Deleted)
  Echocardiogram 2D Echocardiogram has been performed.  Cathie Beams 04/29/2012, 10:39 AM

## 2012-04-29 NOTE — Progress Notes (Signed)
  Echocardiogram 2D Echocardiogram has been performed.  Kenyona Rena, [facility] 04/29/2012, 11:51 AM

## 2012-04-29 NOTE — Consult Note (Signed)
Referring Provider: Danne Baxter (Hospitalist) Primary Care Physician:  Marga Melnick, MD Primary Gastroenterologist:  Dr. Ewing Schlein  Reason for Consultation:  Minimal hematochezia  HPI: Becky Forbes is a 72 y.o. female admitted to the hospital yesterday, when she was found to have rapid ventricular response with atrial fibrillation. She had had a sinking spell in the shower yesterday where she almost black out. Later that day, she began to have episodes of small-volume hematochezia, mostly stool, with a little bit of admixed blood. She has had about 6 or 8 episodes since this began about 48 hours ago, but has had no bleeding for the past 12 hours. There has been no significant change in hemoglobin. Of note, the patient had colonoscopy by Dr. Ewing Schlein in 2011 that showed sigmoid diverticulosis. There was no abdominal pain in association with this problem.   Past Medical History  Diagnosis Date  . HTN (hypertension)   . Hyperlipemia   . Diabetes mellitus     x 10 yrs  . PONV (postoperative nausea and vomiting)   . Atrial fibrillation     CLEARED BY DR [facility] 05/27/11    Past Surgical History  Procedure Date  . Cataract extraction 05/25/07    RIGHT EYE  . Appendectomy   . Carpal tunnel release   . Abdominal hysterectomy   . Lumbar laminectomy   . Foot surgery 2006    MID FOOT RECONSTRUCTION  . Osteotomy 2006    AND FUSION  . Ganglion cyst excision 12/2007    Dr.Gramig  . Total knee arthroplasty 05/02/08, 07/25/08    Dr.Alusio  . Joint replacement     bilateral  . 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 Colonoscopy 01/31/2009    small external/internal hemorrhoids, diverticulosis in sigmoid colon, one small polyp (biopsied). Biospy essentially normal. Dr.Magod    Prior to Admission medications   Medication Sig Start Date End Date Taking? Authorizing Provider  amLODipine (NORVASC) 5 MG tablet Take 1 tablet (5 mg total) by mouth daily. 01/14/12  Yes Pecola Lawless, MD    aspirin EC 81 MG tablet Take 81 mg by mouth daily.   Yes Historical Provider, MD  bromocriptine (PARLODEL) 2.5 MG tablet Take 1 tablet (2.5 mg total) by mouth daily. 04/20/12 04/20/13 Yes Romero Belling, MD  calcium carbonate (OS-CAL) 600 MG TABS Take 600 mg by mouth daily.    Yes Historical Provider, MD  Cholecalciferol (VITAMIN D PO) Take 800 Units by mouth daily.     Yes Historical Provider, MD  hydrochlorothiazide (HYDRODIURIL) 25 MG tablet Take 1 tablet (25 mg total) by mouth daily. 05/27/11  Yes Vesta Mixer, MD  Multiple Vitamin (MULITIVITAMIN WITH MINERALS) TABS Take 1 tablet by mouth daily.   Yes Historical Provider, MD  nateglinide (STARLIX) 120 MG tablet Take 1 tablet (120 mg total) by mouth 3 (three) times daily before meals. 04/20/12  Yes Romero Belling, MD  nebivolol (BYSTOLIC) 10 MG tablet Take 5 mg by mouth daily. 05/27/11  Yes Vesta Mixer, MD  propranolol (INDERAL) 10 MG tablet Take 10 mg by mouth at bedtime. For palpitations 05/27/11  Yes Vesta Mixer, MD  ramipril (ALTACE) 10 MG capsule Take 10 mg by mouth 2 (two) times daily. 01/14/12  Yes Pecola Lawless, MD  saxagliptin HCl (ONGLYZA) 5 MG TABS tablet Take 5 mg by mouth daily. 05/10/11  Yes Romero Belling, MD  simvastatin (ZOCOR) 20 MG tablet Take 1 tablet (20 mg total) by mouth at  bedtime. 01/14/12  Yes Pecola Lawless, MD    Current Facility-Administered Medications  Medication Dose Route Frequency Provider Last Rate Last Dose  . 0.9 %  sodium chloride infusion   Intravenous Once Gavin Pound. Ghim, MD      . 0.9 %  sodium chloride infusion  250 mL Intravenous PRN Joseph Art, DO      . atorvastatin (LIPITOR) tablet 10 mg  10 mg Oral q1800 Vassie Loll, MD   10 mg at 04/29/12 1714  . bromocriptine (PARLODEL) tablet 2.5 mg  2.5 mg Oral Daily Joseph Art, DO   2.5 mg at 04/29/12 1153  . calcium carbonate (OS-CAL - dosed in mg of elemental calcium) tablet 500 mg of elemental calcium  1 tablet Oral Q breakfast Jessica U Vann,  DO   500 mg of elemental calcium at 04/29/12 0851  . insulin aspart (novoLOG) injection 0-15 Units  0-15 Units Subcutaneous TID WC Joseph Art, DO   2 Units at 04/29/12 1217  . insulin aspart (novoLOG) injection 0-5 Units  0-5 Units Subcutaneous QHS Joseph Art, DO      . linagliptin (TRADJENTA) tablet 5 mg  5 mg Oral Daily Joseph Art, DO   5 mg at 04/29/12 1152  . metoprolol tartrate (LOPRESSOR) tablet 25 mg  25 mg Oral BID Vesta Mixer, MD      . multivitamin with minerals tablet 1 tablet  1 tablet Oral Daily Joseph Art, DO   1 tablet at 04/29/12 1153  . nateglinide (STARLIX) tablet 120 mg  120 mg Oral TID AC Jessica U Vann, DO   120 mg at 04/29/12 1714  . ondansetron (ZOFRAN) tablet 4 mg  4 mg Oral Q6H PRN Joseph Art, DO       Or  . ondansetron (ZOFRAN) injection 4 mg  4 mg Intravenous Q6H PRN Joseph Art, DO      . ramipril (ALTACE) capsule 10 mg  10 mg Oral BID Joseph Art, DO   10 mg at 04/29/12 1153  . sodium chloride 0.9 % injection 3 mL  3 mL Intravenous Q12H Joseph Art, DO   3 mL at 04/28/12 2119  . sodium chloride 0.9 % injection 3 mL  3 mL Intravenous Q12H Joseph Art, DO   3 mL at 04/28/12 2119  . sodium chloride 0.9 % injection 3 mL  3 mL Intravenous PRN Joseph Art, DO        Allergies as of 04/28/2012 - Review Complete 04/28/2012  Allergen Reaction Noted  . Morphine and related Itching, Nausea And Vomiting, and Rash 06/14/2010  . Actos (pioglitazone hydrochloride) Swelling 04/26/2011  . Januvia (sitagliptin phosphate) Other (See Comments) 04/26/2011  . Percocet (oxycodone-acetaminophen) Nausea And Vomiting 08/09/2011  . Hydrocodone-acetaminophen Itching and Nausea And Vomiting     Family History  Problem Relation Age of Onset  . Coronary artery disease Father   . Hypertension Father   . Stroke Father   . Diabetes Father   . Kidney failure Mother   . Hypertension Mother   . Other Brother     SEPSIS  . Diabetes Brother   .  Diabetes Sister   . Kidney failure Sister     History   Social History  . Marital Status: Married    Spouse Name: JAMES    Number of Children: 2  . Years of Education: N/A   Occupational History  . 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 Smoking status: Never Smoker   . Smokeless tobacco: Never Used  . Alcohol Use: No  . Drug Use: No  . Sexually Active: Not on file   Other Topics Concern  . Not on file   Social History Narrative  . No narrative on file    Review of Systems: Negative for previous rectal bleeding. No problem with constipation, although she does have some degree of alternating constipation and diarrhea  Physical Exam: Vital signs in last 24 hours: Temp:  [98 F (36.7 C)-98.3 F (36.8 C)] 98.3 F (36.8 C) (01/15 1359) Pulse Rate:  [49-146] 53  (01/15 1359) Resp:  [14-24] 18  (01/15 1359) BP: (96-139)/(58-93) 96/59 mmHg (01/15 1359) SpO2:  [94 %-100 %] 94 % (01/15 1359) Weight:  [82.373 kg (181 lb 9.6 oz)] 82.373 kg (181 lb 9.6 oz) (01/14 2100) Last BM Date: 04/28/12 General:   Alert,  Well-developed, well-nourished, pleasant and cooperative in NAD Head:  Normocephalic and atraumatic. Eyes:  Sclera clear, no icterus.   Conjunctiva pink. Mouth:   No ulcerations or lesions.  Oropharynx pink & moist. Neck:   No masses or thyromegaly. Lungs:  Clear throughout to auscultation.   No wheezes, crackles, or rhonchi. No evident respiratory distress. Heart:   Regular rate and rhythm; no murmurs, clicks, rubs,  or gallops. Abdomen:  Soft, nontender, nontympanitic, and nondistended. No masses, hepatosplenomegaly or ventral hernias noted. Normal bowel sounds, without bruits, guarding, or rebound.   Msk:   Symmetrical without gross deformities. Extremities:   Without clubbing, cyanosis, or edema. Neurologic:  Alert and coherent;  grossly normal neurologically. Skin:  Intact without significant lesions or  rashes. Cervical Nodes:  No significant cervical adenopathy. Psych:   Alert and cooperative. Normal mood and affect.  Intake/Output from previous day:   Intake/Output this shift:    Lab Results:  Basename 04/29/12 0240 04/28/12 1330  WBC 7.7 9.5  HGB 12.4 13.1  HCT 36.0 38.0  PLT 281 326   BMET  Basename 04/29/12 0240 04/28/12 1330  NA 135 138  K 3.8 4.5  CL 98 100  CO2 26 25  GLUCOSE 175* 177*  BUN 25* 24*  CREATININE 1.60* 1.47*  CALCIUM 10.0 10.3   LFT  Basename 04/28/12 1330  PROT 8.3  ALBUMIN 4.1  AST 21  ALT 12  ALKPHOS 148*  BILITOT 0.9  BILIDIR --  IBILI --   PT/INR  Basename 04/28/12 1330  LABPROT 14.6  INR 1.16     Studies/Results: Dg Chest Port 1 View  04/28/2012  *RADIOLOGY REPORT*  Clinical Data: Rectal bleeding.  PORTABLE CHEST - 1 VIEW  Comparison: 06/03/2011  Findings: Mild cardiomegaly.  Lungs are clear.  No effusions.  No acute bony abnormality.  IMPRESSION: Mild cardiomegaly.  No acute findings.   Original Report Authenticated By: Charlett Nose, M.D.     Impression: I think this is most compatible with ischemic colitis, since he came after an apparent hypotensive episode. It seems to be resolving spontaneously. Less likely would be a self-limited diverticular bleed. Plan: The patient would like to have evaluation since she has a family history of colon cancer. I feel that an unprepped limited colonoscopy would be the best way to accomplish this, and the patient is agreeable, having reviewed the risks with her. It will be arranged for tomorrow morning.   LOS: 1 day   Camil Hausmann V  04/29/2012, 5:50 PM

## 2012-04-29 NOTE — Care Management Note (Unsigned)
    Page 1 of 1   04/29/2012     10:48:00 AM   CARE MANAGEMENT NOTE 04/29/2012  Patient:  Becky Forbes, Becky Forbes   Account Number:  [id number]  Date Initiated:  04/29/2012  Documentation initiated by:  GRAVES-BIGELOW,Kennley Schwandt  Subjective/Objective Assessment:   Pt admitted with active GI Bleed and afib. Placed on cardizem gtt.     Action/Plan:   Cm will continue to monitor for disposition needs.   Anticipated DC Date:  05/01/2012   Anticipated DC Plan:  HOME/SELF CARE      DC Planning Services  CM consult      Choice offered to / List presented to:             Status of service:  In process, will continue to follow Medicare Important Message given?   (If response is "NO", the following Medicare IM given date fields will be blank) Date Medicare IM given:   Date Additional Medicare IM given:    Discharge Disposition:    Per UR Regulation:  Reviewed for med. necessity/level of care/duration of stay  If discussed at Long Length of Stay Meetings, dates discussed:    Comments:

## 2012-04-29 NOTE — Progress Notes (Signed)
Dr Elease Hashimoto notified of HR 44-55 at 1520 and cardizem drip stopped and current HR 58-86 off drip.    New orders received.

## 2012-04-29 NOTE — Progress Notes (Signed)
[facility] PROGRESS NOTE  Becky Forbes JYN:[id number] DOB: July 29, 1940 DOA: 04/28/2012 PCP: Marga Melnick, MD  Assessment/Plan: 1-Lower GI bleed: differential includes ischemic colitis Vs diverticulosis Vs AVM; GI has been consulted. Patient w/o abdominal cramps or further active bleeding today. Plan is for sigmoidoscopy/colonoscopy in am.  2-Tachyarrythmia: per cardiology. Now off cardizem drip. Follow recommendations. CE'z negative, 2-d echo pending and no CP.  3-HTN: stable and well controlled. Will monitor.  4-DM: continue SSI  5-Hx of CKD: stable. Follow Cr trend.  DVT:SCD's  Code Status: Full Family Communication: husband at bedside Disposition Plan: home when medically stable   Consultants:  Cardiology  GI  Procedures:  Sigmoidoscopy/colonoscopy in am (04/30/12)  Antibiotics:  none  HPI/Subjective: Afebrile, no CP, no SOB, no nausea, no vomiting. Denies any further episode of diarrhea or blood in her stool.  Objective: Filed Vitals:   04/29/12 0910 04/29/12 0925 04/29/12 0928 04/29/12 1359  BP: 100/58  120/62 96/59  Pulse: 143 80 74 53  Temp:    98.3 F (36.8 C)  TempSrc:    Oral  Resp:    18  Height:      Weight:      SpO2:    94%   No intake or output data in the 24 hours ending 04/29/12 1721 Filed Weights   04/28/12 1257 04/28/12 2100  Weight: 82.555 kg (182 lb) 82.373 kg (181 lb 9.6 oz)    Exam:   General:  NAD, afebrile, no abdominal pain, nausea or vomiting  Cardiovascular: irregular (with moments of bradycardia); no rubs or gallops  Respiratory: CTA  Abdomen: soft, NT, ND, positive BS  Extremities: no edema  Neuro: non focal  Data Reviewed: Basic Metabolic Panel:  Lab 04/29/12 8657 04/28/12 1330  NA 135 138  K 3.8 4.5  CL 98 100  CO2 26 25  GLUCOSE 175* 177*  BUN 25* 24*  CREATININE 1.60* 1.47*  CALCIUM 10.0 10.3  MG -- --  PHOS -- --   Liver Function Tests:  Lab 04/28/12 1330  AST 21  ALT 12  ALKPHOS  148*  BILITOT 0.9  PROT 8.3  ALBUMIN 4.1   CBC:  Lab 04/29/12 0240 04/28/12 1330  WBC 7.7 9.5  NEUTROABS -- 8.0*  HGB 12.4 13.1  HCT 36.0 38.0  MCV 92.1 92.5  PLT 281 326   Cardiac Enzymes:  Lab 04/29/12 0954 04/29/12 0240 04/28/12 2123 04/28/12 1330  CKTOTAL -- -- -- --  CKMB -- -- -- --  CKMBINDEX -- -- -- --  TROPONINI <0.30 <0.30 <0.30 <0.30   BNP (last 3 results) No results found for this basename: PROBNP:3 in the last 8760 hours CBG:  Lab 04/29/12 1659 04/29/12 1143 04/29/12 0731 04/28/12 2054  GLUCAP 79 149* 122* 185*     Studies: Dg Chest Port 1 View  04/28/2012  *RADIOLOGY REPORT*  Clinical Data: Rectal bleeding.  PORTABLE CHEST - 1 VIEW  Comparison: 06/03/2011  Findings: Mild cardiomegaly.  Lungs are clear.  No effusions.  No acute bony abnormality.  IMPRESSION: Mild cardiomegaly.  No acute findings.   Original Report Authenticated By: Charlett Nose, M.D.     Scheduled Meds:   . sodium chloride   Intravenous Once  . atorvastatin  10 mg Oral q1800  . bromocriptine  2.5 mg Oral Daily  . calcium carbonate  1 tablet Oral Q breakfast  . insulin aspart  0-15 Units Subcutaneous TID WC  . insulin aspart  0-5 Units Subcutaneous QHS  . linagliptin  5 mg Oral Daily  . metoprolol tartrate  25 mg Oral BID  . multivitamin with minerals  1 tablet Oral Daily  . nateglinide  120 mg Oral TID AC  . ramipril  10 mg Oral BID  . sodium chloride  3 mL Intravenous Q12H  . sodium chloride  3 mL Intravenous Q12H    Time spent: >30 minutes   Rashard Ryle  [facility] Pager [phone]. If 8PM-8AM, please contact night-coverage at [email or web address], password [facility] 04/29/2012, 5:21 PM  LOS: 1 day

## 2012-04-29 NOTE — Progress Notes (Addendum)
HR dropped to 44-55, cardizem drip stopped. RN Shanda Bumps taking over care of pt and will monitor.

## 2012-04-29 NOTE — Progress Notes (Signed)
UR Completed Margarita Croke Graves-Bigelow, RN,BSN [phone]  

## 2012-04-30 ENCOUNTER — Encounter (HOSPITAL_COMMUNITY): Payer: Self-pay | Admitting: Gastroenterology

## 2012-04-30 ENCOUNTER — Encounter (HOSPITAL_COMMUNITY)
Admission: EM | Disposition: A | Discharge status: Home / Self Care | Payer: Self-pay | Source: Home / Self Care | Attending: Internal Medicine

## 2012-04-30 ENCOUNTER — Telehealth: Payer: Self-pay | Admitting: *Deleted

## 2012-04-30 DIAGNOSIS — I509 Heart failure, unspecified: Secondary | ICD-10-CM

## 2012-04-30 DIAGNOSIS — I5022 Chronic systolic (congestive) heart failure: Secondary | ICD-10-CM

## 2012-04-30 HISTORY — PX: COLONOSCOPY: SHX5424

## 2012-04-30 LAB — GLUCOSE, CAPILLARY: Glucose-Capillary: 137 mg/dL — ABNORMAL HIGH (ref 70–99)

## 2012-04-30 SURGERY — COLONOSCOPY
Anesthesia: Moderate Sedation

## 2012-04-30 MED ORDER — METOPROLOL TARTRATE 25 MG PO TABS: 25.0000 mg | ORAL_TABLET | Freq: Two times a day (BID) | ORAL | Status: DC

## 2012-04-30 MED ORDER — RAMIPRIL 10 MG PO CAPS: 10.0000 mg | ORAL_CAPSULE | Freq: Every day | ORAL | Status: DC

## 2012-04-30 MED ORDER — MIDAZOLAM HCL 5 MG/5ML IJ SOLN
INTRAMUSCULAR | Status: DC | PRN
  Administered 2012-04-30 (×2): 2 mg via INTRAVENOUS
  Administered 2012-04-30: 1 mg via INTRAVENOUS

## 2012-04-30 MED ORDER — POLYETHYLENE GLYCOL 3350 17 GM/SCOOP PO POWD: 17.0000 g | Freq: Every day | ORAL | Status: DC

## 2012-04-30 MED ORDER — FUROSEMIDE 20 MG PO TABS: 20.0000 mg | ORAL_TABLET | Freq: Two times a day (BID) | ORAL | Status: DC

## 2012-04-30 MED ORDER — WARFARIN SODIUM 5 MG PO TABS: ORAL_TABLET | ORAL | Status: DC

## 2012-04-30 MED ORDER — MIDAZOLAM HCL 5 MG/ML IJ SOLN
INTRAMUSCULAR | Status: AC
  Filled 2012-04-30: qty 2

## 2012-04-30 MED ORDER — FENTANYL CITRATE 0.05 MG/ML IJ SOLN
INTRAMUSCULAR | Status: AC
  Filled 2012-04-30: qty 4

## 2012-04-30 MED ORDER — FENTANYL CITRATE 0.05 MG/ML IJ SOLN
INTRAMUSCULAR | Status: DC | PRN
  Administered 2012-04-30 (×2): 25 ug via INTRAVENOUS

## 2012-04-30 NOTE — Progress Notes (Signed)
Pt discharged to home per MD order. Pt received and reviewed all discharge instructions and medication information including follow-up appointments and prescriptions and heart failure education.  Pt verbalized understanding. Pt alert and oriented at discharge.  Pt escorted to private vehicle via wheelchair by guest services.  Becky Forbes

## 2012-04-30 NOTE — Progress Notes (Signed)
Limited colonoscopy showed evidence for probable, mild, resolving ischemic colitis of the distal colon, as clinically suspected.  Please see dictated procedure report.  Okay for discharge from the GI tract standpoint at any time.  No followup colonoscopy, further evaluation, or medical therapy needed for this condition.  I obtained biopsies today and will contact the patient with the results when they are available.  I will sign off the patient's case at this time, but please contact us if we can be of further help with this patient.  Florencia Reasons, M.D. [phone]

## 2012-04-30 NOTE — Telephone Encounter (Signed)
Spoke with pt.  She will start Warfarin 5mg  tabs - 1 tab daily - on Monday 1/20.  She has a [facility]in Clinic appt made for 1/23.  She verbalized understanding of directions and will call if her bleeding restarts.  Rx was e-scribed to [facility]. Leota Sauers Pharm.D. CPP, BCPS Clinical Pharmacist [phone] 04/30/2012 3:27 PM

## 2012-04-30 NOTE — Op Note (Signed)
[facility] [address] [place], [address]   COLONOSCOPY PROCEDURE REPORT  PATIENT: Becky, Forbes  MR#: [id number] BIRTHDATE: 1940/08/29 , 71  yrs. old GENDER: Female ENDOSCOPIST: Bernette Redbird, MD REFERRED BY:   Dr. Marga Melnick PROCEDURE DATE:  04/30/2012 PROCEDURE:     colonoscopy (partial) with biopsies ASA CLASS: INDICATIONS:  small-volume bloody diarrhea occurring after an episode of possible hypertension in the setting of atrial fibrillation with rapid ventricular response (not on anticoagulation), no significant abdominal pain, history of sigmoid diverticulosis on colonoscopy in October 2011 by Dr. Ewing Schlein for family history of colon cancer MEDICATIONS:    fentanyl 50 mcg IV, Versed 5 mg IV  DESCRIPTION OF PROCEDURE: the patient was brought from her hospital room to the endoscopy unit at [facility]. No prep was done for this procedure. Written consent had been provided after description of the risks of the procedure, and time out was performed.  The Pentax adult video colonoscope was advanced quite easily around the colon to the mid transverse colon, where upon a fair amount of stool was encountered, and pullback was initiated. The left colon was actually quite devoid of stool so it is felt that essentially all areas in the distal colon were well seen.  From 20-40 cm, there was mild edema and patchy erythema and, in one area, some minimal exudate. No erosive changes, mucosal necrosis, or gangrene were seen.  The segmental distribution of this abnormality, as well as is endoscopic appearance, was suggestive of mild, resolving ischemic colitis. Biopsies were obtained from several of the erythematous areas.  There was moderate diverticular change was some fixation of the colon in the sigmoid region.  There was essentially no blood in the colonic lumen, just a small old red clot here and there. This stool proximally was  light brown/green in color.  No polyps, masses, or colonic ulcerations were seen up to the limit of the exam. This did not look like inflammatory bowel disease, nor was there any evidence of neoplasia in view of the family history of colon cancer.  The rectal ampulla was somewhat small and I elected not to do retroflexion in the rectum, but and review discloses no rectal abnormalities.  The patient tolerated the procedure well. There was no clinical instability during the course of the procedure.     COMPLICATIONS: None  ENDOSCOPIC IMPRESSION:  1. Probable mild resolving ischemic colitis of the left colon 2. Sigmoid diverticulosis 3. Limited exam to the mid transverse colon (more proximal examination not felt to be clinically indicated)  RECOMMENDATIONS:  Await pathology on today's biopsies. Expectant management. No need for followup colonoscopy. No need for further evaluation or treatment of this condition, which is normally a self-limited process, as it appears to be in this patient's case   _______________________________ eSigned:  Bernette Redbird, MD 04/30/2012 10:18 AM     PATIENT NAME:  Becky, Forbes MR#: [id number]

## 2012-04-30 NOTE — Telephone Encounter (Signed)
Pt rtn call, pls call  °

## 2012-04-30 NOTE — Telephone Encounter (Signed)
Per Dr Elease Hashimoto pt to have an appointment to establish with coumadin clinic next Monday for ischemic colitis/ bleed. I will spoke with coumadin clinic/ erika rn, pt to start Monday eve coumadin 5 mg and will have an app thur/ or Friday next week, I will call pt later, she is still in another dr office for colonoscopy.

## 2012-04-30 NOTE — Discharge Summary (Signed)
Physician Discharge Summary  Becky Forbes ZOX:[id number] DOB: 1940-09-23 DOA: 04/28/2012  PCP: Marga Melnick, MD  Admit date: 04/28/2012 Discharge date: 04/30/2012  Time spent: >30 minutes  Recommendations for Outpatient Follow-up:  -follow up with cardiology for further medication adjustment and evaluation as needed for CHF and atrial fibrillation -will be started on coumadin for A. 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 Follow up with PCP and cardiology as an outpatient.  Diet recommendation: heart healthy diet  Filed Weights   04/28/12 1257 04/28/12 2100 04/30/12 0500  Weight: 82.555 kg (182 lb) 82.373 kg (181 lb 9.6 oz) 82.283 kg (181 lb 6.4 oz)    History of present illness:  72 y.o. female Who was sent from her PCP with a rapid HR. She had no symptoms like she normally does to know her heart is out of rhythm. +weak and tired. Patient has H/o a fib (parox)-only on asa and low dose BB. She had gone to her PCP with rectal bleeding- BRB and about a tablespoon with each BM. No fever, no chills, no CP, no SOB Bleeding started yesterday PM. Mild abdominal pain. No nausea, no vomiting.  In the ER, she was found to have a fib with RVR- occasionally flipping to sinus rate 60-80. They attempted to get a CT scan of abd but patient refused because of her kidneys. She was guaiac + in the ER and hospitalist were called for admission.   Hospital Course:  1-Lower GI bleed: secondary to mild ischemic colitis as shown on colonoscopy. No further work up needed at this point per GI stand point. Biopsies  taken and will be follow by GI doctor. Patient w/o any further bleeding. Will hold ASA for 1 week. After that point ok for anticoagulation according to GI.  2-atrial flutter/atrial fibrillation: per cardiology. Rate controlled now and stable. Plan is to start patient on coumadin and to follow as an outpatient for further medication adjustment as needed.  3-Chronic systolic heart failure: with worsening EF, now down to 35-40%; patient started on metoprolol BID and will also use  Ramipril. Further evaluation and treatemnt per D.r Nahser during follow up visits as an outpatient.  4-HTN: stable and well controlled. Will continue current regimen.  5-DM: continue outpatient hypoglycemic regimen and follow with PCP for further adjustemnts. CBG's well controlled during admission.   6-Hx of CKD: stable. Follow Cr trend during outpatient visit with PCP  7-HLD: continue statins  Rest of medical problems remains stable and the plan is to continue current medication regimen.   Procedures: Colonoscopy: with mild changes consistently with ischemic colitis; no overt bleeding 2-D echo: EF 35-40%; diffuse hypokinesis and mild LVH  Consultations:  Cardiology  GI  Discharge Exam: Filed Vitals:   04/30/12 1001 04/30/12 1010 04/30/12 1020 04/30/12 1030  BP: 111/55 111/55 105/56 112/62  Pulse:      Temp: 97.8 F (36.6 C)     TempSrc: Oral     Resp: 19 17 14 16   Height:      Weight:      SpO2: 96% 98% 93% 94%    General: NAD, no further GI bleeding appreciated Cardiovascular: rate controlled,  no rubs or gallops, per telemetry still on atrial flutter Respiratory: CTA Abdomen: soft, NT, ND, positive BS Extremities: no edema Neuro: non focal  Discharge Instructions  Discharge Orders    Future Appointments: Provider: Department: Dept Phone: Center:   06/22/2012 8:15 AM Romero Belling, MD  [facility] [phone] None     Future Orders Please Complete By Expires   Diet -  low sodium heart healthy      Discharge instructions      Comments:   -Take medications as prescribed -Follow with PCP in 2 weeks -Follow up with cardiology office and coumadin clinic (they will call you with appointment details) -Follow a heart healthy diet       Medication List     As of 04/30/2012 12:37 PM    STOP taking these medications         amLODipine 5 MG tablet   Commonly known as: NORVASC      aspirin EC 81 MG tablet      hydrochlorothiazide 25 MG tablet   Commonly known as: HYDRODIURIL      nebivolol 10 MG tablet   Commonly known as: BYSTOLIC      propranolol 10 MG tablet   Commonly known as: INDERAL      TAKE these medications         bromocriptine 2.5 MG tablet   Commonly known as: PARLODEL   Take 1 tablet (2.5 mg total) by mouth daily.      calcium carbonate 600 MG Tabs   Commonly known as: OS-CAL   Take 600 mg by mouth daily.      furosemide 20 MG tablet   Commonly known as: LASIX   Take 1 tablet (20 mg total) by mouth 2 (two) times daily.      metoprolol tartrate 25 MG tablet   Commonly known as: LOPRESSOR   Take 1 tablet (25 mg total) by mouth 2 (two) times daily.      multivitamin with minerals Tabs   Take 1 tablet by mouth daily.      nateglinide 120 MG tablet   Commonly known as: STARLIX   Take 1 tablet (120 mg total) by mouth 3 (three) times daily before meals.      polyethylene glycol powder powder   Commonly known as: GLYCOLAX/MIRALAX   Take 17 g by mouth daily.      ramipril 10 MG capsule   Commonly known as: ALTACE   Take 1 capsule (10 mg total) by mouth daily.      saxagliptin HCl 5 MG Tabs tablet   Commonly known as: ONGLYZA   Take 5 mg by mouth daily.      simvastatin 20 MG tablet   Commonly known as: ZOCOR   Take 1 tablet (20 mg total) by mouth at bedtime.      VITAMIN D PO   Take 800 Units by mouth daily.           Follow-up Information    Follow up with Marga Melnick, MD. Schedule an appointment as soon  as possible for a visit in 2 weeks.   Contact information:   [address] [facility] [address] [place] [address] [phone]       Follow up with Elyn Aquas., MD. (office will call you with appointment details)    Contact information:   [address] [place] [address] [phone]           The  results of significant diagnostics from this hospitalization (including imaging, microbiology, ancillary and laboratory) are listed below for reference.    Significant Diagnostic Studies: Dg Chest Port 1 View  04/28/2012  *RADIOLOGY REPORT*  Clinical Data: Rectal bleeding.  PORTABLE CHEST - 1 VIEW  Comparison: 06/03/2011  Findings: Mild cardiomegaly.  Lungs are clear.  No effusions.  No acute bony abnormality.  IMPRESSION: Mild cardiomegaly.  No acute findings.   Original Report Authenticated By: Charlett Nose, M.D.     Labs: Basic Metabolic Panel:  Lab 04/29/12 1610 04/28/12 1330  NA 135 138  K 3.8 4.5  CL 98 100  CO2 26 25  GLUCOSE 175* 177*  BUN 25* 24*  CREATININE 1.60* 1.47*  CALCIUM 10.0 10.3  MG -- --  PHOS -- --   Liver Function Tests:  Lab 04/28/12 1330  AST 21  ALT 12  ALKPHOS 148*  BILITOT 0.9  PROT 8.3  ALBUMIN 4.1   CBC:  Lab 04/29/12 0240 04/28/12 1330  WBC 7.7 9.5  NEUTROABS -- 8.0*  HGB 12.4 13.1  HCT 36.0 38.0  MCV 92.1 92.5  PLT 281 326   Cardiac Enzymes:  Lab 04/29/12 0954 04/29/12 0240 04/28/12 2123 04/28/12 1330  CKTOTAL -- -- -- --  CKMB -- -- -- --  CKMBINDEX -- -- -- --  TROPONINI <0.30 <0.30 <0.30 <0.30   CBG:  Lab 04/30/12 1149 04/30/12 0749 04/29/12 2057 04/29/12 1659 04/29/12 1143  GLUCAP 137* 144* 220* 79 149*      Signed:  Taegan Standage  [facility] 04/30/2012, 12:37 PM

## 2012-04-30 NOTE — Telephone Encounter (Signed)
msg left to call back regarding medication changes, number provided.

## 2012-04-30 NOTE — Progress Notes (Signed)
PROGRESS NOTE  Subjective:   Becky Forbes is a 72 yo with hx of Atrial Flutter,  She has an active GI bleed. She denies any chest pain or dyspnea.   Objective:    Vital Signs:   Temp:  [97.5 F (36.4 C)-98.3 F (36.8 C)] 97.7 F (36.5 C) (01/16 0500) Pulse Rate:  [53-143] 66  (01/16 0500) Resp:  [16-18] 16  (01/16 0500) BP: (96-121)/(58-68) 121/68 mmHg (01/16 0500) SpO2:  [94 %-99 %] 98 % (01/16 0500) Weight:  [181 lb 6.4 oz (82.283 kg)] 181 lb 6.4 oz (82.283 kg) (01/16 0500)  Last BM Date: 04/28/12   24-hour weight change: Weight change: -9.6 oz (-0.272 kg)  Weight trends: Filed Weights   04/28/12 1257 04/28/12 2100 04/30/12 0500  Weight: 182 lb (82.555 kg) 181 lb 9.6 oz (82.373 kg) 181 lb 6.4 oz (82.283 kg)    Intake/Output:  01/15 0701 - 01/16 0700 In: 120 [P.O.:120] Out: -      Physical Exam: BP 121/68  Pulse 66  Temp 97.7 F (36.5 C) (Oral)  Resp 16  Ht 5\' 4"  (1.626 m)  Wt 181 lb 6.4 oz (82.283 kg)  BMI 31.14 kg/m2  SpO2 98%  General: Vital signs reviewed and noted.   Head: Normocephalic, atraumatic.  Eyes: conjunctivae/corneas clear.  EOM's intact.   Throat: normal  Neck:  normal  Lungs:   clear  Heart:  RR mostly,   Abdomen:  Soft, non-tender, non-distended    Extremities: No edema   Neurologic: A&O X3, CN II - XII are grossly intact.   Psych: Normal     Labs: BMET:  Basename 04/29/12 0240 04/28/12 1330  NA 135 138  K 3.8 4.5  CL 98 100  CO2 26 25  GLUCOSE 175* 177*  BUN 25* 24*  CREATININE 1.60* 1.47*  CALCIUM 10.0 10.3  MG -- --  PHOS -- --    Liver function tests:  Basename 04/28/12 1330  AST 21  ALT 12  ALKPHOS 148*  BILITOT 0.9  PROT 8.3  ALBUMIN 4.1   No results found for this basename: LIPASE:2,AMYLASE:2 in the last 72 hours  CBC:  Basename 04/29/12 0240 04/28/12 1330  WBC 7.7 9.5  NEUTROABS -- 8.0*  HGB 12.4 13.1  HCT 36.0 38.0  MCV 92.1 92.5  PLT 281 326    Cardiac Enzymes:  Basename 04/29/12 0954  04/29/12 0240 04/28/12 2123 04/28/12 1330  CKTOTAL -- -- -- --  CKMB -- -- -- --  TROPONINI <0.30 <0.30 <0.30 <0.30    Coagulation Studies:  Basename 04/28/12 1330  LABPROT 14.6  INR 1.16    Other: No components found with this basename: POCBNP:3 No results found for this basename: DDIMER in the last 72 hours  Basename 04/28/12 2123  HGBA1C 7.6*   No results found for this basename: CHOL,HDL,LDLCALC,TRIG,CHOLHDL in the last 72 hours  Basename 04/28/12 2123  TSH 5.100*  T4TOTAL --  T3FREE --  THYROIDAB --   No results found for this basename: VITAMINB12,FOLATE,FERRITIN,TIBC,IRON,RETICCTPCT in the last 72 hours   Other results:  Medications:    Infusions:    Scheduled Medications:    . sodium chloride   Intravenous Once  . atorvastatin  10 mg Oral q1800  . bromocriptine  2.5 mg Oral Daily  . calcium carbonate  1 tablet Oral Q breakfast  . insulin aspart  0-15 Units Subcutaneous TID WC  . insulin aspart  0-5 Units Subcutaneous QHS  . linagliptin  5 mg Oral Daily  .  metoprolol tartrate  25 mg Oral BID  . multivitamin with minerals  1 tablet Oral Daily  . nateglinide  120 mg Oral TID AC  . ramipril  10 mg Oral BID  . sodium chloride  3 mL Intravenous Q12H  . sodium chloride  3 mL Intravenous Q12H    Tele:  Atrial flutter with controlled v response  Assessment/ Plan:    1.  Atrial Flutter: She is currently in atrial flutter with a controlled ventricular response. We'll not be able to anticoagulate her because she is having an active GI bleed.  She is getting a colonoscopy this am.  If the source of bleeding is identified and can be controlled, we may be able to start anticoagulants.  2. Chronic systolic CHF:  The echo 1/15 showed moderate LV dysfunction with EF of 35-40%.  We will change the diltiazem to BB - likely metoprolol   HYPERTENSION (07/03/2009)  her blood pressure is currently stable. We will change her amlodipine to 2 diltiazem for better rate  control.   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   GI bleed (04/28/2012)   CKD (chronic kidney disease) (04/28/2012)    Disposition:  Length of Stay: 2  Vesta Mixer, Montez Hageman., MD, [facility] 04/30/2012, 7:33 AM Office [phone] Pager [phone]

## 2012-05-01 ENCOUNTER — Encounter (HOSPITAL_COMMUNITY): Payer: Self-pay | Admitting: Gastroenterology

## 2012-05-06 ENCOUNTER — Telehealth: Payer: Self-pay

## 2012-05-06 ENCOUNTER — Ambulatory Visit (INDEPENDENT_AMBULATORY_CARE_PROVIDER_SITE_OTHER): Payer: Medicare Other | Admitting: Internal Medicine

## 2012-05-06 ENCOUNTER — Encounter: Payer: Self-pay | Admitting: Internal Medicine

## 2012-05-06 VITALS — BP 128/86 | HR 71 | Temp 97.5°F | Wt 184.4 lb

## 2012-05-06 DIAGNOSIS — I10 Essential (primary) hypertension: Secondary | ICD-10-CM | POA: Diagnosis not present

## 2012-05-06 DIAGNOSIS — K559 Vascular disorder of intestine, unspecified: Secondary | ICD-10-CM

## 2012-05-06 DIAGNOSIS — N189 Chronic kidney disease, unspecified: Secondary | ICD-10-CM | POA: Diagnosis not present

## 2012-05-06 DIAGNOSIS — E875 Hyperkalemia: Secondary | ICD-10-CM

## 2012-05-06 DIAGNOSIS — K922 Gastrointestinal hemorrhage, unspecified: Secondary | ICD-10-CM

## 2012-05-06 DIAGNOSIS — R946 Abnormal results of thyroid function studies: Secondary | ICD-10-CM | POA: Diagnosis not present

## 2012-05-06 LAB — CBC WITH DIFFERENTIAL/PLATELET
Eosinophils Relative: 2.6 % (ref 0.0–5.0)
HCT: 38.6 % (ref 36.0–46.0)
Hemoglobin: 12.8 g/dL (ref 12.0–15.0)
Lymphs Abs: 1.1 10*3/uL (ref 0.7–4.0)
Monocytes Relative: 6.2 % (ref 3.0–12.0)
Neutro Abs: 8.3 10*3/uL — ABNORMAL HIGH (ref 1.4–7.7)
RDW: 15.1 % — ABNORMAL HIGH (ref 11.5–14.6)
WBC: 10.4 10*3/uL (ref 4.5–10.5)

## 2012-05-06 LAB — BASIC METABOLIC PANEL
CO2: 31 mEq/L (ref 19–32)
Calcium: 10.2 mg/dL (ref 8.4–10.5)
Creatinine, Ser: 1.9 mg/dL — ABNORMAL HIGH (ref 0.4–1.2)
Glucose, Bld: 153 mg/dL — ABNORMAL HIGH (ref 70–99)

## 2012-05-06 MED ORDER — LEVOTHYROXINE SODIUM 50 MCG PO TABS: ORAL_TABLET | ORAL | Status: DC

## 2012-05-06 NOTE — Telephone Encounter (Signed)
Call from the labs Critical Potassium at 6.1. Dr.Hopper please advise        KP

## 2012-05-06 NOTE — Progress Notes (Signed)
  Subjective:    Patient ID: Becky Forbes, female    DOB: Sep 22, 1940, 72 y.o.   MRN: [id number]  HPI She presented 04/28/14 with bright red rectal bleeding. She was found to have paroxysmal atrial fibrillation with rapid ventricular rate which was asymptomatic.  Hospital records reviewed.She was hospitalized for rate control. Colonoscopy revealed ischemic colitis 1/16. Coumadin was authorized as of 05/04/12. She'll be followed in the Coumadin clinic  Hospital records were reviewed. She was found to have an ejection fraction of 35-40%. Cardiac enzymes were negative. Creatinine was 1.6; hematocrit 36. Her TSH was found to be 5.1 on 1/14. She is not on thyroid medication    Review of Systems Since discharge she denies abdominal pain, melena, rectal bleeding, constipation or diarrhea.  She also denies chest pain, palpitations, dyspnea, paroxysmal nocturnal dyspnea, or edema.  She does describe some fatigue. Blood pressures been as low as 99/60.    Objective:   Physical Exam General appearance is one of good health and nourishment w/o distress.  Eyes: No conjunctival inflammation or scleral icterus is present. Thyroid normal  Oral exam: Upper denture ; lips and gums are healthy appearing.There is no oropharyngeal erythema or exudate noted.   Heart:  Markedly irregular rhythm & rate. Without gallop, murmur, click, rub or other extra sounds     Lungs:Chest clear to auscultation; no wheezes, rhonchi,rales ,or rubs present.No increased work of breathing.   Abdomen: bowel sounds normal, soft and non-tender without masses, organomegaly or hernias noted.  No guarding or rebound   Skin:Warm & dry.  Intact without suspicious lesions or rashes ; no jaundice or tenting  Lymphatic: No lymphadenopathy is noted about the head, neck, axilla             Assessment & Plan:

## 2012-05-06 NOTE — Telephone Encounter (Signed)
Spoke with patient, patient verbalized all instructions. Patient will go to [facility] to have potassium rechecked in the am, future order placed

## 2012-05-06 NOTE — Assessment & Plan Note (Addendum)
Full thyroid function tests will checked. If TSH is greater than 3; low-dose Synthroid would be recommended

## 2012-05-06 NOTE — Assessment & Plan Note (Signed)
Renal function will be reassessed

## 2012-05-06 NOTE — Assessment & Plan Note (Signed)
EF 35-40% 04/2012

## 2012-05-06 NOTE — Patient Instructions (Addendum)
If you activate My Chart; the results can be released to you as soon as they populate from the lab. If you choose not to use this program; the labs have to be reviewed, copied & mailed   causing a delay in getting the results to you.   Do not start the thyroid until we verify borderline hypothyroidism

## 2012-05-06 NOTE — Assessment & Plan Note (Signed)
If blood pressure is consistently low; ramipril should be decreased by half with close monitoring of blood pressure

## 2012-05-06 NOTE — Telephone Encounter (Signed)
Repeat K+ in am here or @ [facility]. Code : HYPERkalemia. Avoid citrus fruits & bananas in diet and  the salt substitute No Salt, which contains  potassium .

## 2012-05-06 NOTE — Telephone Encounter (Signed)
Labs printed and placed on ledge for quicker review

## 2012-05-06 NOTE — Assessment & Plan Note (Signed)
Ischemic colitis most likely related to hypotension in the context of the very rapid paroxysmal atrial fibrillation. CBC and differential will be checked

## 2012-05-07 ENCOUNTER — Other Ambulatory Visit (INDEPENDENT_AMBULATORY_CARE_PROVIDER_SITE_OTHER): Payer: Medicare Other

## 2012-05-07 ENCOUNTER — Ambulatory Visit (INDEPENDENT_AMBULATORY_CARE_PROVIDER_SITE_OTHER): Payer: Medicare Other | Admitting: *Deleted

## 2012-05-07 DIAGNOSIS — I4891 Unspecified atrial fibrillation: Secondary | ICD-10-CM | POA: Diagnosis not present

## 2012-05-07 DIAGNOSIS — Z7901 Long term (current) use of anticoagulants: Secondary | ICD-10-CM

## 2012-05-07 DIAGNOSIS — E875 Hyperkalemia: Secondary | ICD-10-CM | POA: Diagnosis not present

## 2012-05-07 NOTE — Patient Instructions (Addendum)

## 2012-05-12 ENCOUNTER — Telehealth: Payer: Self-pay | Admitting: Cardiovascular Disease

## 2012-05-12 NOTE — Telephone Encounter (Signed)
New Problem     Pt discharged from hospital recently by Dr. Elease Hashimoto. Was calling to see if she needed to come in. Offered an appt, declined and asked for a nurse to contact her.

## 2012-05-12 NOTE — Telephone Encounter (Signed)
Pt was given an app with Dr Elease Hashimoto, pt declined ov with anyone else, dbl booked Dr Elease Hashimoto. Reviewed meds, salt intake, pt has times of dizziness and low bp, advised standing/ fall precautions and told her if her bpm is low to hold ramapril till bp up, pt is a retired Engineer, civil (consulting), she feels she is in SR currently and feeling better today, told her to call with any further questions and she agreed to plan.

## 2012-05-14 ENCOUNTER — Ambulatory Visit (INDEPENDENT_AMBULATORY_CARE_PROVIDER_SITE_OTHER): Payer: Medicare Other

## 2012-05-14 DIAGNOSIS — Z7901 Long term (current) use of anticoagulants: Secondary | ICD-10-CM | POA: Diagnosis not present

## 2012-05-14 DIAGNOSIS — I4891 Unspecified atrial fibrillation: Secondary | ICD-10-CM

## 2012-05-14 LAB — POCT INR: INR: 4.1

## 2012-05-19 ENCOUNTER — Ambulatory Visit (INDEPENDENT_AMBULATORY_CARE_PROVIDER_SITE_OTHER): Payer: Medicare Other | Admitting: Pharmacist

## 2012-05-19 ENCOUNTER — Ambulatory Visit (INDEPENDENT_AMBULATORY_CARE_PROVIDER_SITE_OTHER): Payer: Medicare Other | Admitting: Cardiovascular Disease

## 2012-05-19 ENCOUNTER — Encounter: Payer: Self-pay | Admitting: Cardiovascular Disease

## 2012-05-19 VITALS — BP 134/78 | HR 77 | Ht 64.0 in | Wt 188.0 lb

## 2012-05-19 DIAGNOSIS — I4891 Unspecified atrial fibrillation: Secondary | ICD-10-CM | POA: Diagnosis not present

## 2012-05-19 DIAGNOSIS — I10 Essential (primary) hypertension: Secondary | ICD-10-CM | POA: Diagnosis not present

## 2012-05-19 DIAGNOSIS — I509 Heart failure, unspecified: Secondary | ICD-10-CM | POA: Diagnosis not present

## 2012-05-19 DIAGNOSIS — Z7901 Long term (current) use of anticoagulants: Secondary | ICD-10-CM | POA: Diagnosis not present

## 2012-05-19 DIAGNOSIS — I5022 Chronic systolic (congestive) heart failure: Secondary | ICD-10-CM

## 2012-05-19 LAB — BASIC METABOLIC PANEL
Chloride: 103 mEq/L (ref 96–112)
Creatinine, Ser: 1.6 mg/dL — ABNORMAL HIGH (ref 0.4–1.2)
Potassium: 4.8 mEq/L (ref 3.5–5.1)

## 2012-05-19 LAB — POCT INR: INR: 2.9

## 2012-05-19 MED ORDER — FUROSEMIDE 20 MG PO TABS: 20.0000 mg | ORAL_TABLET | Freq: Every day | ORAL | Status: DC

## 2012-05-19 MED ORDER — METOPROLOL TARTRATE 50 MG PO TABS: 50.0000 mg | ORAL_TABLET | Freq: Two times a day (BID) | ORAL | Status: DC

## 2012-05-19 NOTE — Assessment & Plan Note (Signed)
Becky Forbes presented several weeks ago with a GI bleed, atrial fibrillation and was noted to have a low ejection fraction.  Left ventricle:  mild LVH. Systolic function was moderately reduced.  EF  35% to 40%. Diffuse hypokinesis. - Mitral valve: Mild regurgitation. - Left atrium:   mildly dilated. - Right ventricle:   mildly dilated. Systolic function was mildly reduced. - Right atrium: The atrium was mildly dilated. - Pulmonary arteries: Systolic pressure was moderately increased. PA peak pressure: 49mm Hg (S).  Her ejection fraction is around 35%. She started on Lasix 20 mg twice a day, metoprolol, and ramipril. Unfortunately, she developed some mild to moderate renal insufficiency. We'll need to decrease her Lasix to 20 mg once a day. We will increase her metoprolol to 50 mg twice a day which will help with her heart failure and will also help with her rate control for her atrial fibrillation.

## 2012-05-19 NOTE — Progress Notes (Signed)
Alfonse Ras Date of Birth  14-May-1940 [facility]     [place] [facility]  [address]    [address]   [address] [place], [place]  [address]    [place], [place]  [address] [phone]  Fax  [phone]  [phone]  Fax [phone]  Problem list: 1. Atrial fibrillation- paroxysmal 2. Diabetes mellitus 3. Hypertension 4. Hyperlipidemia  History of Present Illness:  [name] not had any episodes of chest pain or shortness of breath.   She had back surgery earlier this year. [name] not had any chest pain or shortness breath. Her blood pressure has been well controlled.  [name] not had any episodes of atrial fibrillation.  Feb. 4, 2014: She was admitted to the hospital in mid January with a GI bleed and was noted to be in atrial fibrillation.  She had an echocardiogram which revealed an ejection fraction of 35-40%. She started on Lasix 20 twice a day and metoprolol. She had some blood work drawn last week which revealed a mild increase in her creatinine up to 1.9. She was also found to have a potassium of 6.1. Recheck potassium level was 4.6.  [name] noted that her blood pressures been quite low since she left the hospital. She also was having symptoms of orthostatic hypotension.   Current Outpatient Prescriptions on File Prior to Visit  Medication Sig Dispense Refill  . bromocriptine (PARLODEL) 2.5 MG tablet Take 1 tablet (2.5 mg total) by mouth daily.  90 tablet  3  . calcium carbonate (OS-CAL) 600 MG TABS Take 600 mg by mouth daily.       . Cholecalciferol (VITAMIN D PO) Take 800 Units by mouth daily.        . furosemide (LASIX) 20 MG tablet Take 1 tablet (20 mg total) by mouth 2 (two) times daily.  30 tablet  1  . metoprolol tartrate (LOPRESSOR) 25 MG tablet Take 1 tablet (25 mg total) by mouth 2 (two) times daily.  60 tablet  1  . Multiple Vitamin (MULITIVITAMIN WITH MINERALS) TABS Take 1 tablet by mouth daily.      . nateglinide (STARLIX) 120 MG tablet Take 1 tablet  (120 mg total) by mouth 3 (three) times daily before meals.  180 tablet  3  . polyethylene glycol powder (GLYCOLAX/MIRALAX) powder Take 17 g by mouth daily.  255 g  0  . ramipril (ALTACE) 10 MG capsule Take 1 capsule (10 mg total) by mouth daily.      . saxagliptin HCl (ONGLYZA) 5 MG TABS tablet Take 5 mg by mouth daily.      . simvastatin (ZOCOR) 20 MG tablet Take 1 tablet (20 mg total) by mouth at bedtime.  90 tablet  1  . warfarin (COUMADIN) 5 MG tablet Take as directed  30 tablet  3    Allergies  Allergen Reactions  . Morphine And Related Itching, Nausea And Vomiting and Rash  . Actos (Pioglitazone Hydrochloride) Swelling  . Januvia (Sitagliptin Phosphate) Other (See Comments)    Pt says this caused elevated liver enzymes and creatinine  . Percocet (Oxycodone-Acetaminophen) Nausea And Vomiting  . Hydrocodone-Acetaminophen Itching and Nausea And Vomiting    Past Medical History  Diagnosis Date  . HTN (hypertension)   . Hyperlipemia   . Diabetes mellitus     x 10 yrs  . PONV (postoperative nausea and vomiting)   . Atrial fibrillation     CLEARED BY DR Elease Hashimoto 05/27/11  . PAF (paroxysmal atrial  fibrillation) 1/14-16/14    Past Surgical History  Procedure Date  . Cataract extraction 05/25/07    RIGHT EYE  . Appendectomy   . Carpal tunnel release   . Abdominal hysterectomy   . Lumbar laminectomy   . Foot surgery 2006    MID FOOT RECONSTRUCTION  . Osteotomy 2006    AND FUSION  . Ganglion cyst excision 12/2007    Dr.Gramig  . Total knee arthroplasty 05/02/08, 07/25/08    Dr.Alusio  . Joint replacement     bilateral  . 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 Colonoscopy 01/31/2009    small external/internal hemorrhoids, diverticulosis in sigmoid colon, one small polyp (biopsied). Biospy essentially normal. Dr.Magod  . Colonoscopy 04/30/2012    Procedure: COLONOSCOPY;  Surgeon: Florencia Reasons, MD;  Location: [facility] ENDOSCOPY;  Service: Endoscopy;  Laterality: N/A;   unprepped, poss flex only    History  Smoking status  . Never Smoker   Smokeless tobacco  . Never Used    History  Alcohol Use No    Family History  Problem Relation Age of Onset  . Coronary artery disease Father   . Hypertension Father   . Stroke Father   . Diabetes Father   . Kidney failure Mother   . Hypertension Mother   . Other Brother     SEPSIS  . Diabetes Brother   . Diabetes Sister   . Kidney failure Sister     Reviw of Systems:  Reviewed in the HPI.  All other systems are negative.  Physical Exam: Blood pressure 134/78, pulse 77, height 5\' 4"  (1.626 m), weight 188 lb (85.276 kg), SpO2 98.00%. General: Well developed, well nourished, in no acute distress.  Head: Normocephalic, atraumatic, sclera non-icteric, mucus membranes are moist,   Neck: Supple. Negative for carotid bruits. JVD not elevated.  Lungs: Clear bilaterally to auscultation without wheezes, rales, or rhonchi. Breathing is unlabored.  Heart: RRR with S1 S2. No murmurs, rubs, or gallops appreciated.  Abdomen: Soft, non-tender, non-distended with normoactive bowel sounds. No hepatomegaly. No rebound/guarding. No obvious abdominal masses.  Msk:  Strength and tone appear normal for age.  Extremities: No clubbing or cyanosis. No edema.  Distal pedal pulses are 2+ and equal bilaterally.  Neuro: Alert and oriented X 3. Moves all extremities spontaneously.  Psych:  Responds to questions appropriately with a normal affect.  ECG: .  Assessment / Plan:

## 2012-05-19 NOTE — Patient Instructions (Addendum)
Your physician has recommended you make the following change in your medication:   Decrease lasix 20 mg once a day Increase metoprolol 50 mg Twice a day ( twelve hours apart)  Your physician recommends that you return for lab work in: TODAY  Your physician recommends that you schedule a follow-up appointment in: 3-4 weeks with Lawson Fiscal, NP  PT WILL NEED TO BE SEEN TO SET UP CARDIOVERSION/ NEEDS EKG, PT INR, BMET, CBC WITH VISIT

## 2012-05-19 NOTE — Assessment & Plan Note (Signed)
Cait presents today for further evaluation. She was admitted to the hospital several weeks ago with a rectal bleed. She spent have atrial fibrillation and was also found to have moderate congestive heart failure.  [name] feeling a bit better although she has been hypertensive for the past several weeks. [name] now getting use to her Lasix dose. [name] had some lab work drawn last week which revealed an increase in her creatinine as well as hyperkalemia. I suspect that she was volume depleted.  This point the patient has caused her heart rate to be gas.  We'll increase the metoprolol to 50 mg twice a day. We will set her up to see Lawson Fiscal in 3-4 weeks for  Cardioversion pre-op visit. We'll get an EKG, CBC, basic metabolic profile, and PT/INR at that time.

## 2012-05-26 ENCOUNTER — Ambulatory Visit (INDEPENDENT_AMBULATORY_CARE_PROVIDER_SITE_OTHER): Payer: Medicare Other

## 2012-05-26 DIAGNOSIS — I4891 Unspecified atrial fibrillation: Secondary | ICD-10-CM

## 2012-05-26 DIAGNOSIS — Z7901 Long term (current) use of anticoagulants: Secondary | ICD-10-CM | POA: Diagnosis not present

## 2012-05-26 LAB — POCT INR: INR: 3

## 2012-06-05 ENCOUNTER — Ambulatory Visit (INDEPENDENT_AMBULATORY_CARE_PROVIDER_SITE_OTHER): Payer: Medicare Other | Admitting: *Deleted

## 2012-06-05 DIAGNOSIS — I4891 Unspecified atrial fibrillation: Secondary | ICD-10-CM

## 2012-06-05 DIAGNOSIS — Z7901 Long term (current) use of anticoagulants: Secondary | ICD-10-CM

## 2012-06-05 LAB — POCT INR: INR: 2.4

## 2012-06-05 MED ORDER — WARFARIN SODIUM 5 MG PO TABS: ORAL_TABLET | ORAL | Status: DC

## 2012-06-08 ENCOUNTER — Encounter: Payer: Self-pay | Admitting: Family Medicine

## 2012-06-08 ENCOUNTER — Ambulatory Visit: Payer: Medicare Other

## 2012-06-08 ENCOUNTER — Ambulatory Visit (INDEPENDENT_AMBULATORY_CARE_PROVIDER_SITE_OTHER): Payer: Medicare Other | Admitting: Family Medicine

## 2012-06-08 VITALS — BP 126/80 | HR 74 | Temp 97.3°F | Resp 18 | Ht 63.0 in | Wt 189.0 lb

## 2012-06-08 DIAGNOSIS — Z5189 Encounter for other specified aftercare: Secondary | ICD-10-CM | POA: Diagnosis not present

## 2012-06-08 DIAGNOSIS — M79609 Pain in unspecified limb: Secondary | ICD-10-CM | POA: Diagnosis not present

## 2012-06-08 MED ORDER — CEPHALEXIN 500 MG PO CAPS: 500.0000 mg | ORAL_CAPSULE | Freq: Two times a day (BID) | ORAL | Status: DC

## 2012-06-08 NOTE — Progress Notes (Signed)
Patient ID: TASHONNA DESCOTEAUX MRN: [id number], DOB: Mar 31, 1941, 72 y.o. Date of Encounter: 06/08/2012, 1:02 PM   PROCEDURE NOTE: Verbal consent obtained. Sterile technique employed. Numbing: Anesthesia obtained with 2% lidocaine plain.  Cleansed with soap and water. Irrigated.  Wound explored, no deep structures involved, no foreign bodies.   Wound repaired with # 4 SI sutures. Hemostasis obtained. Wound cleansed and dressed.  Wound care instructions including precautions covered with patient. Handout given.  Anticipate suture removal in 7-10 days  Rhoderick Moody, PA-C 06/08/2012 1:02 PM

## 2012-06-08 NOTE — Progress Notes (Signed)
[facility] [address], [place] [address] [phone] [phone]  Date:  06/08/2012   Name:  Becky Forbes   DOB:  20-Jul-1940   MRN:  [id number]  PCP:  Marga Melnick, MD    Chief Complaint: No chief complaint on file.   History of Present Illness:  Becky Forbes is a 72 y.o. very pleasant female patient who presents with the following:  Right handed female here today with a laceration to her left index finger- she cut her finger just before coming to clinic on a hedge trimmer.  She does not think the cut is very deep but thought she might need stitches  PCP- Marga Melnick with Devol Tdap 05/07/2011 No other concerns today.  She is on coumadin for a. Fib and had a GI bleed last month- colonoscopy showed probably mild resolving ischemic colitis of her left colon.  She is now recovered, INR 3 days ago was 2.4  Patient Active Problem List  Diagnosis  . HYPERLIPIDEMIA  . 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 HYPERTENSION  . Atrial fibrillation  . DEGENERATIVE JOINT DISEASE, KNEE  . OSTEOPENIA  . CAROTID BRUIT, RIGHT  . 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 Diverticulosis  . GI bleed  . CKD (chronic kidney disease)  . Chronic systolic CHF (congestive heart failure)  . Ischemic colitis  . Nonspecific abnormal results of thyroid function study  . Long term (current) use of anticoagulants    Past Medical History  Diagnosis Date  . HTN (hypertension)   . Hyperlipemia   . Diabetes mellitus     x 10 yrs  . PONV (postoperative nausea and vomiting)   . Atrial fibrillation     CLEARED BY DR Elease Hashimoto 05/27/11  . PAF (paroxysmal atrial fibrillation) 1/14-16/14    Past Surgical History  Procedure Laterality Date  . Cataract extraction  05/25/07    RIGHT EYE  . Appendectomy    . Carpal tunnel release    . Abdominal hysterectomy    . Lumbar laminectomy    . Foot surgery  2006    MID FOOT  RECONSTRUCTION  . Osteotomy  2006    AND FUSION  . Ganglion cyst excision  12/2007    Dr.Gramig  . Total knee arthroplasty  05/02/08, 07/25/08    Dr.Alusio  . Joint replacement      bilateral  . 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 Colonoscopy  01/31/2009    small external/internal hemorrhoids, diverticulosis in sigmoid colon, one small polyp (biopsied). Biospy essentially normal. Dr.Magod  . Colonoscopy  04/30/2012    Procedure: COLONOSCOPY;  Surgeon: Florencia Reasons, MD;  Location: [facility] ENDOSCOPY;  Service: Endoscopy;  Laterality: N/A;  unprepped, poss flex only    History  Substance Use Topics  . Smoking status: Never Smoker   . Smokeless tobacco: Never Used  . Alcohol Use: No    Family History  Problem Relation Age of Onset  . Coronary artery disease Father   . Hypertension Father   . Stroke Father   . Diabetes Father   . Kidney failure Mother   . Hypertension Mother   . Other Brother     SEPSIS  . Diabetes Brother   . Diabetes Sister   . Kidney failure Sister     Allergies  Allergen Reactions  . Morphine And Related Itching, Nausea And Vomiting and Rash  .  Actos (Pioglitazone Hydrochloride) Swelling  . Januvia (Sitagliptin Phosphate) Other (See Comments)    Pt says this caused elevated liver enzymes and creatinine  . Percocet (Oxycodone-Acetaminophen) Nausea And Vomiting  . Hydrocodone-Acetaminophen Itching and Nausea And Vomiting    Medication list has been reviewed and updated.  Current Outpatient Prescriptions on File Prior to Visit  Medication Sig Dispense Refill  . bromocriptine (PARLODEL) 2.5 MG tablet Take 1 tablet (2.5 mg total) by mouth daily.  90 tablet  3  . calcium carbonate (OS-CAL) 600 MG TABS Take 600 mg by mouth daily.       . Cholecalciferol (VITAMIN D PO) Take 800 Units by mouth daily.        . furosemide (LASIX) 20 MG tablet Take 1 tablet (20 mg total) by mouth daily.  30 tablet  5  . metoprolol tartrate (LOPRESSOR) 50 MG  tablet Take 1 tablet (50 mg total) by mouth 2 (two) times daily.  60 tablet  5  . Multiple Vitamin (MULITIVITAMIN WITH MINERALS) TABS Take 1 tablet by mouth daily.      . nateglinide (STARLIX) 120 MG tablet Take 1 tablet (120 mg total) by mouth 3 (three) times daily before meals.  180 tablet  3  . polyethylene glycol powder (GLYCOLAX/MIRALAX) powder Take 17 g by mouth daily.  255 g  0  . ramipril (ALTACE) 10 MG capsule Take 1 capsule (10 mg total) by mouth daily.      . saxagliptin HCl (ONGLYZA) 5 MG TABS tablet Take 5 mg by mouth daily.      . simvastatin (ZOCOR) 20 MG tablet Take 1 tablet (20 mg total) by mouth at bedtime.  90 tablet  1  . warfarin (COUMADIN) 5 MG tablet Take as directed  110 tablet  1   No current facility-administered medications on file prior to visit.    Review of Systems:  As per HPI- otherwise negative.   Physical Examination: There were no vitals filed for this visit.  See chart, entered after note created There were no vitals filed for this visit. There is no weight on file to calculate BMI. Ideal Body Weight:     GEN: WDWN, NAD, Non-toxic, Alert & Oriented x 3 HEENT: Atraumatic, Normocephalic.  Ears and Nose: No external deformity. EXTR: No clubbing/cyanosis/edema NEURO: Normal gait.  PSYCH: Normally interactive. Conversant. Not depressed or anxious appearing.  Calm demeanor.  Left index finger- there is a laceration on the lateral, distal finger. It is about 1.5 cm in length, does not involve the nail.  Normal finger perfusion, sensation, and flexion/ extension strength   [facility] reading (PRIMARY) by  Dr. Patsy Lager. Left index finger: possible tiny fleck of calcium on lateral view only, no other fracture  LEFT INDEX FINGER 2+V  Comparison: None  Findings: Soft tissue swelling volar aspect distal phalanx. Questionable tiny calcific density is seen volar to the tuft of the distal phalanx, could represent soft tissue calcification but a tiny fracture  fragment or a tiny radiopaque foreign body is not completely excluded. Joint spaces preserved. Bones demineralized. No additional fracture, dislocation or bone destruction identified.  IMPRESSION: Tiny calcific density volar to the tuft of the distal phalanx question soft tissue calcification, tiny fracture fragment or radiopaque foreign body.  Assessment and Plan: Finger pain, left - Plan: DG Finger Index Left  Laceration of finger of left hand with tendon involvement, subsequent encounter - Plan: cephALEXin (KEFLEX) 500 MG capsule  Meds ordered this encounter  Medications  . warfarin (COUMADIN)  5 MG tablet    Sig: Pt taking 2.5 mg m.w, f    5mg  t,th,sat, sun  . cephALEXin (KEFLEX) 500 MG capsule    Sig: Take 1 capsule (500 mg total) by mouth 2 (two) times daily.    Dispense:  20 capsule    Refill:  0   Laceration of finger with possible tiny bone injury.  Wound repaired as per note by Rhoderick Moody, PA-C.  Dressed and placed in a splint.  She is instructed to wear the splint for the next few days until she no longer has pain.  She will come back for SR, sooner if any sign of infection, worsening pain, etc.  Reduced keflex dose slightly due to her suppressed renal function.   tdap given last year.   Valente Fosberg, MD  Called to go over x-ray report.  Discussed radiologist's DDX as above.  She will let us know if she has any problems

## 2012-06-08 NOTE — Patient Instructions (Addendum)
We are going to put you on keflex for your finger to help prevent any infection.  Please let us know if you have any concerns    WOUND CARE Please return in 7-10 days to have your stitches/staples removed or sooner if you have concerns. Marland Kitchen Keep area clean and dry for 24 hours. Do not remove bandage, if applied. . After 24 hours, remove bandage and wash wound gently with mild soap and warm water. Reapply a new bandage after cleaning wound, if directed. . Continue daily cleansing with soap and water until stitches/staples are removed. . Do not apply any ointments or creams to the wound while stitches/staples are in place, as this may cause delayed healing. . Notify the office if you experience any of the following signs of infection: Swelling, redness, pus drainage, streaking, fever >101.0 F . Notify the office if you experience excessive bleeding that does not stop after 15-20 minutes of constant, firm pressure.

## 2012-06-10 DIAGNOSIS — M545 Low back pain: Secondary | ICD-10-CM | POA: Diagnosis not present

## 2012-06-10 DIAGNOSIS — IMO0002 Reserved for concepts with insufficient information to code with codable children: Secondary | ICD-10-CM | POA: Diagnosis not present

## 2012-06-16 ENCOUNTER — Ambulatory Visit: Payer: Medicare Other | Admitting: Nurse Practitioner

## 2012-06-17 ENCOUNTER — Ambulatory Visit (INDEPENDENT_AMBULATORY_CARE_PROVIDER_SITE_OTHER): Payer: Medicare Other | Admitting: Physician Assistant

## 2012-06-17 VITALS — BP 142/78 | HR 90 | Temp 97.9°F | Resp 16 | Ht 63.0 in | Wt 189.0 lb

## 2012-06-17 NOTE — Progress Notes (Signed)
Patient ID: Becky Forbes MRN: [id number], DOB: 1940-08-24 72 y.o. Date of Encounter: 06/17/2012, 8:23 AM  Primary Physician: Marga Melnick, MD  Chief Complaint: Suture removal    See note from 06/08/12  HPI: 72 y.o. y/o female with injury to left 2nd digit Here for suture removal s/p placement on 06/08/12 Doing well No issues/complaints Afebrile/ No chills No erythema No pain Able to move without difficulty Finishes antibiotic today Normal sensation  Past Medical History  Diagnosis Date  . HTN (hypertension)   . Hyperlipemia   . Diabetes mellitus     x 10 yrs  . PONV (postoperative nausea and vomiting)   . Atrial fibrillation     CLEARED BY DR Elease Hashimoto 05/27/11  . PAF (paroxysmal atrial fibrillation) 1/14-16/14     Home Meds: Prior to Admission medications   Medication Sig Start Date End Date Taking? Authorizing Claron Rosencrans  bromocriptine (PARLODEL) 2.5 MG tablet Take 1 tablet (2.5 mg total) by mouth daily. 04/20/12 04/20/13 Yes Romero Belling, MD  calcium carbonate (OS-CAL) 600 MG TABS Take 600 mg by mouth daily.    Yes Historical Bosten Newstrom, MD  cephALEXin (KEFLEX) 500 MG capsule Take 1 capsule (500 mg total) by mouth 2 (two) times daily. 06/08/12  Yes Gwenlyn Found Copland, MD  Cholecalciferol (VITAMIN D PO) Take 800 Units by mouth daily.     Yes Historical Graves Nipp, MD  furosemide (LASIX) 20 MG tablet Take 1 tablet (20 mg total) by mouth daily. 05/19/12  Yes Vesta Mixer, MD  metoprolol tartrate (LOPRESSOR) 50 MG tablet Take 1 tablet (50 mg total) by mouth 2 (two) times daily. 05/19/12  Yes Vesta Mixer, MD  Multiple Vitamin (MULITIVITAMIN WITH MINERALS) TABS Take 1 tablet by mouth daily.   Yes Historical Zebastian Carico, MD  nateglinide (STARLIX) 120 MG tablet Take 1 tablet (120 mg total) by mouth 3 (three) times daily before meals. 04/20/12  Yes Romero Belling, MD  polyethylene glycol powder (GLYCOLAX/MIRALAX) powder Take 17 g by mouth daily. 04/30/12  Yes Vassie Loll, MD  ramipril (ALTACE)  10 MG capsule Take 1 capsule (10 mg total) by mouth daily. 04/30/12  Yes Vassie Loll, MD  saxagliptin HCl (ONGLYZA) 5 MG TABS tablet Take 5 mg by mouth daily. 05/10/11  Yes Romero Belling, MD  simvastatin (ZOCOR) 20 MG tablet Take 1 tablet (20 mg total) by mouth at bedtime. 01/14/12  Yes Pecola Lawless, MD  warfarin (COUMADIN) 5 MG tablet Pt taking 2.5 mg m.w, f    5mg  t,th,sat, sun 06/05/12  Yes Vesta Mixer, MD    Allergies:  Allergies  Allergen Reactions  . Morphine And Related Itching, Nausea And Vomiting and Rash  . Actos (Pioglitazone Hydrochloride) Swelling  . Januvia (Sitagliptin Phosphate) Other (See Comments)    Pt says this caused elevated liver enzymes and creatinine  . Percocet (Oxycodone-Acetaminophen) Nausea And Vomiting  . Hydrocodone-Acetaminophen Itching and Nausea And Vomiting    Physical Exam: Blood pressure 142/78, pulse 90, temperature 97.9 F (36.6 C), temperature source Oral, resp. rate 16, height 5\' 3"  (1.6 m), weight 189 lb (85.73 kg), SpO2 96.00%., Body mass index is 33.49 kg/(m^2). General: Well developed, well nourished, in no acute distress. Head: Normocephalic, atraumatic, sclera non-icteric, no xanthomas, nares are without discharge.  Neck: Supple. Lungs: Breathing is unlabored. Heart: Normal rate. Msk:  Strength and tone appear normal for age. Wound: Wound well healed without erythema, swelling, or tenderness to palpation. FROM and 5/5 strength with normal sensation throughout including 2 point  discrimination Skin: See above, otherwise dry without rash or erythema. Extremities: No clubbing or cyanosis. No edema. Neuro: Alert and oriented X 3. Moves all extremities spontaneously.  Psych:  Responds to questions appropriately with a normal affect.   PROCEDURE: Verbal consent obtained. 4 simple interrupted sutures removed without difficulty. 2 steri strips applied.  Assessment and Plan: 72 y.o. female here for suture removal for wound described  above. -Sutures removed per above -Steri strips applied -Metal fold over for the next 2-3 days -Finish Keflex -RTC prn  Signed, Eula Listen, PA-C 06/17/2012 8:23 AM

## 2012-06-19 ENCOUNTER — Ambulatory Visit: Payer: Medicare Other | Admitting: Nurse Practitioner

## 2012-06-22 ENCOUNTER — Ambulatory Visit (INDEPENDENT_AMBULATORY_CARE_PROVIDER_SITE_OTHER): Payer: Medicare Other

## 2012-06-22 ENCOUNTER — Other Ambulatory Visit: Payer: Self-pay

## 2012-06-22 ENCOUNTER — Ambulatory Visit (INDEPENDENT_AMBULATORY_CARE_PROVIDER_SITE_OTHER): Payer: Medicare Other | Admitting: Endocrinology

## 2012-06-22 ENCOUNTER — Encounter: Payer: Self-pay | Admitting: Endocrinology

## 2012-06-22 ENCOUNTER — Ambulatory Visit (INDEPENDENT_AMBULATORY_CARE_PROVIDER_SITE_OTHER): Payer: Medicare Other | Admitting: Nurse Practitioner

## 2012-06-22 ENCOUNTER — Telehealth: Payer: Self-pay | Admitting: Internal Medicine

## 2012-06-22 ENCOUNTER — Telehealth: Payer: Self-pay | Admitting: Endocrinology

## 2012-06-22 ENCOUNTER — Encounter: Payer: Self-pay | Admitting: Nurse Practitioner

## 2012-06-22 VITALS — BP 132/60 | HR 51 | Ht 63.5 in | Wt 187.2 lb

## 2012-06-22 VITALS — BP 122/70 | HR 60 | Wt 187.0 lb

## 2012-06-22 DIAGNOSIS — I4891 Unspecified atrial fibrillation: Secondary | ICD-10-CM | POA: Diagnosis not present

## 2012-06-22 DIAGNOSIS — Z7901 Long term (current) use of anticoagulants: Secondary | ICD-10-CM | POA: Diagnosis not present

## 2012-06-22 DIAGNOSIS — E785 Hyperlipidemia, unspecified: Secondary | ICD-10-CM

## 2012-06-22 DIAGNOSIS — T887XXA Unspecified adverse effect of drug or medicament, initial encounter: Secondary | ICD-10-CM

## 2012-06-22 DIAGNOSIS — I480 Paroxysmal atrial fibrillation: Secondary | ICD-10-CM

## 2012-06-22 LAB — CBC WITH DIFFERENTIAL/PLATELET
Basophils Absolute: 0.1 10*3/uL (ref 0.0–0.1)
Basophils Relative: 1.3 % (ref 0.0–3.0)
Eosinophils Absolute: 0.2 10*3/uL (ref 0.0–0.7)
Eosinophils Relative: 2.4 % (ref 0.0–5.0)
HCT: 31.9 % — ABNORMAL LOW (ref 36.0–46.0)
Hemoglobin: 10.6 g/dL — ABNORMAL LOW (ref 12.0–15.0)
Lymphocytes Relative: 12.9 % (ref 12.0–46.0)
Lymphs Abs: 0.9 10*3/uL (ref 0.7–4.0)
MCHC: 33.3 g/dL (ref 30.0–36.0)
MCV: 94.1 fl (ref 78.0–100.0)
Monocytes Absolute: 0.5 10*3/uL (ref 0.1–1.0)
Monocytes Relative: 8.2 % (ref 3.0–12.0)
Neutro Abs: 5 10*3/uL (ref 1.4–7.7)
Neutrophils Relative %: 75.2 % (ref 43.0–77.0)
Platelets: 287 10*3/uL (ref 150.0–400.0)
RBC: 3.39 Mil/uL — ABNORMAL LOW (ref 3.87–5.11)
RDW: 15.1 % — ABNORMAL HIGH (ref 11.5–14.6)
WBC: 6.6 10*3/uL (ref 4.5–10.5)

## 2012-06-22 LAB — POCT INR: INR: 2.5

## 2012-06-22 MED ORDER — FUROSEMIDE 20 MG PO TABS: 20.0000 mg | ORAL_TABLET | Freq: Every day | ORAL | Status: DC | PRN

## 2012-06-22 MED ORDER — ACARBOSE 25 MG PO TABS: 25.0000 mg | ORAL_TABLET | Freq: Three times a day (TID) | ORAL | Status: DC

## 2012-06-22 MED ORDER — GLUCOSE BLOOD VI STRP: ORAL_STRIP | Status: DC

## 2012-06-22 NOTE — Patient Instructions (Addendum)
i have sent a prescription to your pharmacy, to add-on "acarbose." check your blood sugar once a day.  vary the time of day when you check, between before the 3 meals, and at bedtime.  also check if you have symptoms of your blood sugar being too high or too low.  please keep a record of the readings and bring it to your next appointment here.  please call us sooner if your blood sugar goes below 70, or if it stays over 200.  Please come back for a follow-up appointment in 3 months.

## 2012-06-22 NOTE — Progress Notes (Signed)
Subjective:    Patient ID: Becky Forbes, female    DOB: [birth year], 72 y.o.   MRN: [id number]  HPI Pt returns for f/u of type 2 DM (dx'ed 1998, complicated by retinopathy; she is not on metformin due to renal insuff; she has edema on actos).  pt states she feels well in general.  no cbg record, but states cbg's are well-controlled.   Past Medical History  Diagnosis Date  . HTN (hypertension)   . Hyperlipemia   . Diabetes mellitus     x 10 yrs  . PONV (postoperative nausea and vomiting)   . Atrial fibrillation     CLEARED BY DR Elease Hashimoto 05/27/11  . PAF (paroxysmal atrial fibrillation) 1/14-16/14    Past Surgical History  Procedure Laterality Date  . Cataract extraction  05/25/07    RIGHT EYE  . Appendectomy    . Carpal tunnel release    . Abdominal hysterectomy    . Lumbar laminectomy    . Foot surgery  2006    MID FOOT RECONSTRUCTION  . Osteotomy  2006    AND FUSION  . Ganglion cyst excision  12/2007    Dr.Gramig  . Total knee arthroplasty  05/02/08, 07/25/08    Dr.Alusio  . Joint replacement      bilateral  . 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 Colonoscopy  01/31/2009    small external/internal hemorrhoids, diverticulosis in sigmoid colon, one small polyp (biopsied). Biospy essentially normal. Dr.Magod  . Colonoscopy  04/30/2012    Procedure: COLONOSCOPY;  Surgeon: Florencia Reasons, MD;  Location[facility] ENDOSCOPY;  Service: Endoscopy;  Laterality: N/A;  unprepped, poss flex only    History   Social History  . Marital Status: Married    Spouse Name: JAMES    Number of Children: 2  . Years of Education: N/A   Occupational History  . 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 Smoking status: Never Smoker   . Smokeless tobacco: Never Used  . Alcohol Use: No  . Drug Use: No  . Sexually Active: Not Currently   Other Topics Concern  . Not on file   Social History Narrative  . No narrative on file     Current Outpatient Prescriptions on File Prior to Visit  Medication Sig Dispense Refill  . bromocriptine (PARLODEL) 2.5 MG tablet Take 1 tablet (2.5 mg total) by mouth daily.  90 tablet  3  . calcium carbonate (OS-CAL) 600 MG TABS Take 600 mg by mouth daily.       . Cholecalciferol (VITAMIN D PO) Take 800 Units by mouth daily.        . metoprolol tartrate (LOPRESSOR) 50 MG tablet Take 1 tablet (50 mg total) by mouth 2 (two) times daily.  60 tablet  5  . Multiple Vitamin (MULITIVITAMIN WITH MINERALS) TABS Take 1 tablet by mouth daily.      . nateglinide (STARLIX) 120 MG tablet Take 1 tablet (120 mg total) by mouth 3 (three) times daily before meals.  180 tablet  3  . saxagliptin HCl (ONGLYZA) 5 MG TABS tablet Take 5 mg by mouth daily.      Marland Kitchen warfarin (COUMADIN) 5 MG tablet Pt taking 2.5 mg m.w, f    5mg  t,th,sat, sun       No current facility-administered medications on file prior to visit.    Allergies  Allergen Reactions  .  Morphine And Related Itching, Nausea And Vomiting and Rash  . Actos (Pioglitazone Hydrochloride) Swelling  . Januvia (Sitagliptin Phosphate) Other (See Comments)    Pt says this caused elevated liver enzymes and creatinine  . Percocet (Oxycodone-Acetaminophen) Nausea And Vomiting  . Hydrocodone-Acetaminophen Itching and Nausea And Vomiting    Family History  Problem Relation Age of Onset  . Coronary artery disease Father   . Hypertension Father   . Stroke Father   . Diabetes Father   . Kidney failure Mother   . Hypertension Mother   . Other Brother     SEPSIS  . Diabetes Brother   . Diabetes Sister   . Kidney failure Sister    BP 122/70  Pulse 60  Wt 187 lb (84.823 kg)  BMI 33.13 kg/m2  SpO2 98%  Review of Systems denies hypoglycemia.    Objective:   Physical Exam VITAL SIGNS:  See vs page.   GENERAL: no distress. Ext: no edema.   Lab Results  Component Value Date   HGBA1C 7.6* 04/28/2012      Assessment & Plan:  DM: needs  increased rx

## 2012-06-22 NOTE — Patient Instructions (Addendum)
You are in a regular rhythm today  We will try to get your coumadin checked today  We are checking labs today  See Dr. Elease Hashimoto in a month  Change your Lasix to just as needed (for weight gain, swelling, shortness of breath)  Call the [facility] office at [phone] if you have any questions, problems or concerns.

## 2012-06-22 NOTE — Progress Notes (Signed)
Becky Forbes Date of Birth: 1941-02-17 Medical Record #[id number]  History of Present Illness: Becky Forbes is seen back today for a one month check. She is seen for Dr. Elease Hashimoto. She has had atrial fib - noted during an admission for rectal bleeding to be in atrial fib. EF showed her EF to be down to 35 to 40%. Started on Lasix and metoprolol. Apparently has ischemic colitis and diverticulitis. Started on Coumadin. Her other issues include PAF, DM, HTN and HLD. Plan was to proceed with cardioversion if she remains in atrial fib. CHADs is elevated.  She comes in today. She is here alone. She is doing ok. Feels like she is back in rhythm. Says that shortly after her last visit here, she started feeling better. No chest pain. Not dizzy. Not short of breath. No swelling. No cough. No abdominal bloating. Feels good on her medicines. Weight has been stable at home. No more bleeding issues. She did cut her finger on her left hand with a hedge trimmer and had to have some stitches but said it really did not bleed. No more GI bleeding.   Current Outpatient Prescriptions on File Prior to Visit  Medication Sig Dispense Refill  . bromocriptine (PARLODEL) 2.5 MG tablet Take 1 tablet (2.5 mg total) by mouth daily.  90 tablet  3  . calcium carbonate (OS-CAL) 600 MG TABS Take 600 mg by mouth daily.       . Cholecalciferol (VITAMIN D PO) Take 800 Units by mouth daily.        . furosemide (LASIX) 20 MG tablet Take 1 tablet (20 mg total) by mouth daily.  30 tablet  5  . glucose blood test strip 1 each by Other route as needed for other. Use as instructed      . metoprolol tartrate (LOPRESSOR) 50 MG tablet Take 1 tablet (50 mg total) by mouth 2 (two) times daily.  60 tablet  5  . Multiple Vitamin (MULITIVITAMIN WITH MINERALS) TABS Take 1 tablet by mouth daily.      . nateglinide (STARLIX) 120 MG tablet Take 1 tablet (120 mg total) by mouth 3 (three) times daily before meals.  180 tablet  3  . ramipril (ALTACE) 10 MG  capsule Take 1 capsule (10 mg total) by mouth daily.      . saxagliptin HCl (ONGLYZA) 5 MG TABS tablet Take 5 mg by mouth daily.      . simvastatin (ZOCOR) 20 MG tablet Take 1 tablet (20 mg total) by mouth at bedtime.  90 tablet  1  . warfarin (COUMADIN) 5 MG tablet Pt taking 2.5 mg m.w, f    5mg  t,th,sat, sun      . acarbose (PRECOSE) 25 MG tablet Take 1 tablet (25 mg total) by mouth 3 (three) times daily with meals.  90 tablet  11   No current facility-administered medications on file prior to visit.    Allergies  Allergen Reactions  . Morphine And Related Itching, Nausea And Vomiting and Rash  . Actos (Pioglitazone Hydrochloride) Swelling  . Januvia (Sitagliptin Phosphate) Other (See Comments)    Pt says this caused elevated liver enzymes and creatinine  . Percocet (Oxycodone-Acetaminophen) Nausea And Vomiting  . Hydrocodone-Acetaminophen Itching and Nausea And Vomiting    Past Medical History  Diagnosis Date  . HTN (hypertension)   . Hyperlipemia   . Diabetes mellitus     x 10 yrs  . PONV (postoperative nausea and vomiting)   . Atrial fibrillation  CLEARED BY DR Elease Hashimoto 05/27/11  . PAF (paroxysmal atrial fibrillation) 1/14-16/14    Past Surgical History  Procedure Laterality Date  . Cataract extraction  05/25/07    RIGHT EYE  . Appendectomy    . Carpal tunnel release    . Abdominal hysterectomy    . Lumbar laminectomy    . Foot surgery  2006    MID FOOT RECONSTRUCTION  . Osteotomy  2006    AND FUSION  . Ganglion cyst excision  12/2007    Dr.Gramig  . Total knee arthroplasty  05/02/08, 07/25/08    Dr.Alusio  . Joint replacement      bilateral  . 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 Colonoscopy  01/31/2009    small external/internal hemorrhoids, diverticulosis in sigmoid colon, one small polyp (biopsied). Biospy essentially normal. Dr.Magod  . Colonoscopy  04/30/2012    Procedure: COLONOSCOPY;  Surgeon: Florencia Reasons, MD;  Location: [facility] ENDOSCOPY;   Service: Endoscopy;  Laterality: N/A;  unprepped, poss flex only    History  Smoking status  . Never Smoker   Smokeless tobacco  . Never Used    History  Alcohol Use No    Family History  Problem Relation Age of Onset  . Coronary artery disease Father   . Hypertension Father   . Stroke Father   . Diabetes Father   . Kidney failure Mother   . Hypertension Mother   . Other Brother     SEPSIS  . Diabetes Brother   . Diabetes Sister   . Kidney failure Sister     Review of Systems: The review of systems is per the HPI.  All other systems were reviewed and are negative.  Physical Exam: Ht 5' 3.5" (1.613 m)  Wt 187 lb 3.2 oz (84.913 kg)  BMI 32.64 kg/m2 Patient is very pleasant and in no acute distress. Skin is warm and dry. Color is normal.  HEENT is unremarkable. Normocephalic/atraumatic. PERRL. Sclera are nonicteric. Neck is supple. No masses. No JVD. Lungs are clear. Cardiac exam shows a regular rate and rhythm. Abdomen is soft. Extremities are without edema. Gait and ROM are intact. No gross neurologic deficits noted.  LABORATORY DATA: EKG today shows sinus bradycardia. Rate is 51.   Lab Results  Component Value Date   WBC 10.4 05/06/2012   HGB 12.8 05/06/2012   HCT 38.6 05/06/2012   PLT 339.0 05/06/2012   GLUCOSE 99 05/19/2012   CHOL 127 05/27/2011   TRIG 102.0 05/27/2011   HDL 39.40 05/27/2011   LDLCALC 67 05/27/2011   ALT 12 04/28/2012   AST 21 04/28/2012   NA 140 05/19/2012   K 4.8 05/19/2012   CL 103 05/19/2012   CREATININE 1.6* 05/19/2012   BUN 35* 05/19/2012   CO2 29 05/19/2012   TSH 5.40 05/06/2012   INR 2.4 06/05/2012   HGBA1C 7.6* 04/28/2012   MICROALBUR 2.4* 09/06/2010    Assessment / Plan:  1. PAF - back in sinus. No need to set up cardioversion. CHADs is elevated. Would probably need to stay on long term anticoagulation as long as we do not have bleeding issues.   2. LV dysfunction - EF is down to 35 to 40% per recent echo - On ACE and beta blocker. Would repeat  her echo this spring. Clinically she looks good and appears compensated.   3. Recent GI bleed - no recurrence.   4. HTN - blood pressure looks fine.   See  Dr. Elease Hashimoto back in 4 weeks. Check labs today and try to get her INR done today as well.   Patient is agreeable to this plan and will call if any problems develop in the interim.

## 2012-06-22 NOTE — Telephone Encounter (Signed)
error 

## 2012-06-22 NOTE — Telephone Encounter (Signed)
Refill: Simvastatin 20 mg  Ramipril 10 mg

## 2012-06-23 ENCOUNTER — Other Ambulatory Visit: Payer: Self-pay | Admitting: *Deleted

## 2012-06-23 LAB — BASIC METABOLIC PANEL
BUN: 34 mg/dL — ABNORMAL HIGH (ref 6–23)
CO2: 26 mEq/L (ref 19–32)
Calcium: 9.2 mg/dL (ref 8.4–10.5)
Chloride: 103 mEq/L (ref 96–112)
Creatinine, Ser: 1.5 mg/dL — ABNORMAL HIGH (ref 0.4–1.2)
GFR: 36.24 mL/min — ABNORMAL LOW (ref >60.00)
Glucose, Bld: 104 mg/dL — ABNORMAL HIGH (ref 70–99)
Potassium: 4.4 mEq/L (ref 3.5–5.1)
Sodium: 138 mEq/L (ref 135–145)

## 2012-06-23 MED ORDER — SIMVASTATIN 20 MG PO TABS: ORAL_TABLET | ORAL | Status: DC

## 2012-06-23 MED ORDER — RAMIPRIL 10 MG PO CAPS: 10.0000 mg | ORAL_CAPSULE | Freq: Every day | ORAL | Status: DC

## 2012-06-26 ENCOUNTER — Encounter: Payer: Self-pay | Admitting: Internal Medicine

## 2012-06-29 DIAGNOSIS — M47814 Spondylosis without myelopathy or radiculopathy, thoracic region: Secondary | ICD-10-CM | POA: Diagnosis not present

## 2012-06-30 ENCOUNTER — Other Ambulatory Visit (INDEPENDENT_AMBULATORY_CARE_PROVIDER_SITE_OTHER): Payer: Medicare Other

## 2012-06-30 DIAGNOSIS — D649 Anemia, unspecified: Secondary | ICD-10-CM

## 2012-06-30 LAB — CBC WITH DIFFERENTIAL/PLATELET
Basophils Absolute: 0.1 10*3/uL (ref 0.0–0.1)
Basophils Relative: 1 % (ref 0.0–3.0)
Eosinophils Absolute: 0.3 10*3/uL (ref 0.0–0.7)
Eosinophils Relative: 4 % (ref 0.0–5.0)
HCT: 33.8 % — ABNORMAL LOW (ref 36.0–46.0)
Hemoglobin: 11.3 g/dL — ABNORMAL LOW (ref 12.0–15.0)
Lymphocytes Relative: 15.1 % (ref 12.0–46.0)
Lymphs Abs: 1 10*3/uL (ref 0.7–4.0)
MCHC: 33.4 g/dL (ref 30.0–36.0)
MCV: 94.5 fl (ref 78.0–100.0)
Monocytes Absolute: 0.6 10*3/uL (ref 0.1–1.0)
Monocytes Relative: 8.1 % (ref 3.0–12.0)
Neutro Abs: 4.9 10*3/uL (ref 1.4–7.7)
Neutrophils Relative %: 71.8 % (ref 43.0–77.0)
Platelets: 325 10*3/uL (ref 150.0–400.0)
RBC: 3.58 Mil/uL — ABNORMAL LOW (ref 3.87–5.11)
RDW: 14.8 % — ABNORMAL HIGH (ref 11.5–14.6)
WBC: 6.9 10*3/uL (ref 4.5–10.5)

## 2012-07-07 DIAGNOSIS — M47814 Spondylosis without myelopathy or radiculopathy, thoracic region: Secondary | ICD-10-CM | POA: Diagnosis not present

## 2012-07-10 DIAGNOSIS — M47814 Spondylosis without myelopathy or radiculopathy, thoracic region: Secondary | ICD-10-CM | POA: Diagnosis not present

## 2012-07-15 ENCOUNTER — Other Ambulatory Visit: Payer: Self-pay | Admitting: *Deleted

## 2012-07-15 MED ORDER — ACARBOSE 25 MG PO TABS: 25.0000 mg | ORAL_TABLET | Freq: Three times a day (TID) | ORAL | Status: DC

## 2012-07-15 MED ORDER — NATEGLINIDE 120 MG PO TABS: 120.0000 mg | ORAL_TABLET | Freq: Three times a day (TID) | ORAL | Status: DC

## 2012-07-15 NOTE — Telephone Encounter (Signed)
Patient changing pharmacy to [facility].

## 2012-07-17 DIAGNOSIS — M47814 Spondylosis without myelopathy or radiculopathy, thoracic region: Secondary | ICD-10-CM | POA: Diagnosis not present

## 2012-07-23 ENCOUNTER — Ambulatory Visit (INDEPENDENT_AMBULATORY_CARE_PROVIDER_SITE_OTHER): Payer: Medicare Other | Admitting: Cardiovascular Disease

## 2012-07-23 ENCOUNTER — Ambulatory Visit (INDEPENDENT_AMBULATORY_CARE_PROVIDER_SITE_OTHER): Payer: Medicare Other | Admitting: *Deleted

## 2012-07-23 ENCOUNTER — Encounter: Payer: Self-pay | Admitting: Cardiovascular Disease

## 2012-07-23 VITALS — BP 140/74 | HR 48 | Ht 63.5 in | Wt 185.4 lb

## 2012-07-23 DIAGNOSIS — E785 Hyperlipidemia, unspecified: Secondary | ICD-10-CM | POA: Diagnosis not present

## 2012-07-23 DIAGNOSIS — I4891 Unspecified atrial fibrillation: Secondary | ICD-10-CM

## 2012-07-23 DIAGNOSIS — I5022 Chronic systolic (congestive) heart failure: Secondary | ICD-10-CM

## 2012-07-23 DIAGNOSIS — I509 Heart failure, unspecified: Secondary | ICD-10-CM

## 2012-07-23 DIAGNOSIS — E782 Mixed hyperlipidemia: Secondary | ICD-10-CM

## 2012-07-23 DIAGNOSIS — Z7901 Long term (current) use of anticoagulants: Secondary | ICD-10-CM

## 2012-07-23 LAB — POCT INR: INR: 2.2

## 2012-07-23 NOTE — Progress Notes (Signed)
Becky Forbes Date of Birth  1940/08/10 [facility]     [place] [facility]  [address]    [address]   [address] [place], [place]  [address]    [place], [place]  [address] [phone]  Fax  [phone]  [phone]  Fax [phone]  Problem list: 1. Atrial fibrillation- paroxysmal 2. Diabetes mellitus 3. Hypertension 4. Hyperlipidemia 5. Chronic systolic CHF - EF 35-40%  (assumed to be nonischemic)   History of Present Illness:  [name] not had any episodes of chest pain or shortness of breath.   She had back surgery earlier this year. [name] not had any chest pain or shortness breath. Her blood pressure has been well controlled.  [name] not had any episodes of atrial fibrillation.  Feb. 4, 2014: She was admitted to the hospital in mid January with a GI bleed and was noted to be in atrial fibrillation.  She had an echocardiogram which revealed an ejection fraction of 35-40%. She started on Lasix 20 twice a day and metoprolol. She had some blood work drawn last week which revealed a mild increase in her creatinine up to 1.9. She was also found to have a potassium of 6.1. Recheck potassium level was 4.6.  [name] noted that her blood pressures been quite low since she left the hospital. She also was having symptoms of orthostatic hypotension.  July 23, 2012:  Becky Forbes is doing well.  No further episodes of Afib and no blood in stool.  She was admitted to the hospital in January with GI bleeding was found to have atrial fibrillation. Workup at that time revealed what appeared to be ischemic colitis. Her aspirin was held for a week. She improved and has not had any further bleeding. We have been able to restart Coumadin she's not had any bleeding. The bruising that she was having is now better since she has been off the aspirin.  She has a nonischemic cardiomyopathy. Echocardiogram in January revealed an ejection fraction of 35-40%. She has  moderate pulmonary hypertension with  an estimated PA pressure of 49. I have  reviewed the echo and the results are noted below. Left ventricle: The cavity size was normal. Wall thickness was increased in a pattern of mild LVH. Systolic function was moderately reduced. The estimated ejection fraction was in the range of 35% to 40%. Diffuse hypokinesis. - Mitral valve: Mild regurgitation. - Left atrium: The atrium was mildly dilated. - Right ventricle: The cavity size was mildly dilated. Systolic function was mildly reduced. - Right atrium: The atrium was mildly dilated. - Pulmonary arteries: Systolic pressure was moderately increased. PA peak pressure: 49mm Hg   She continues to have generalized fatigue and malaise.  She has chronic renal insufficiency.   Current Outpatient Prescriptions on File Prior to Visit  Medication Sig Dispense Refill  . acarbose (PRECOSE) 25 MG tablet Take 1 tablet (25 mg total) by mouth 3 (three) times daily with meals.  270 tablet  2  . bromocriptine (PARLODEL) 2.5 MG tablet Take 1 tablet (2.5 mg total) by mouth daily.  90 tablet  3  . calcium carbonate (OS-CAL) 600 MG TABS Take 600 mg by mouth daily.       . Cholecalciferol (VITAMIN D PO) Take 800 Units by mouth daily.        . furosemide (LASIX) 20 MG tablet Take 1 tablet (20 mg total) by mouth daily as needed.  30 tablet  5  . glucose blood (BAYER CONTOUR  NEXT TEST) test strip Use as instructed  100 each  12  . glucose blood test strip 1 each by Other route as needed for other. Use as instructed      . metoprolol tartrate (LOPRESSOR) 50 MG tablet Take 1 tablet (50 mg total) by mouth 2 (two) times daily.  60 tablet  5  . Multiple Vitamin (MULITIVITAMIN WITH MINERALS) TABS Take 1 tablet by mouth daily.      . nateglinide (STARLIX) 120 MG tablet Take 1 tablet (120 mg total) by mouth 3 (three) times daily before meals.  270 tablet  2  . polyethylene glycol powder (GLYCOLAX/MIRALAX) powder Take 17 g by mouth as needed.      . ramipril (ALTACE) 10  MG capsule Take 1 capsule (10 mg total) by mouth daily.  90 capsule  1  . saxagliptin HCl (ONGLYZA) 5 MG TABS tablet Take 5 mg by mouth daily.      . simvastatin (ZOCOR) 20 MG tablet LABS OVERDUE,  30 tablet  0  . warfarin (COUMADIN) 5 MG tablet Pt taking 2.5 mg m.w, f    5mg  t,th,sat, sun       No current facility-administered medications on file prior to visit.    Allergies  Allergen Reactions  . Morphine And Related Itching, Nausea And Vomiting and Rash  . Actos (Pioglitazone Hydrochloride) Swelling  . Januvia (Sitagliptin Phosphate) Other (See Comments)    Pt says this caused elevated liver enzymes and creatinine  . Percocet (Oxycodone-Acetaminophen) Nausea And Vomiting  . Hydrocodone-Acetaminophen Itching and Nausea And Vomiting    Past Medical History  Diagnosis Date  . HTN (hypertension)   . Hyperlipemia   . Diabetes mellitus     x 10 yrs  . PONV (postoperative nausea and vomiting)   . Atrial fibrillation     CLEARED BY DR Elease Hashimoto 05/27/11  . PAF (paroxysmal atrial fibrillation) 1/14-16/14    Past Surgical History  Procedure Laterality Date  . Cataract extraction  05/25/07    RIGHT EYE  . Appendectomy    . Carpal tunnel release    . Abdominal hysterectomy    . Lumbar laminectomy    . Foot surgery  2006    MID FOOT RECONSTRUCTION  . Osteotomy  2006    AND FUSION  . Ganglion cyst excision  12/2007    Dr.Gramig  . Total knee arthroplasty  05/02/08, 07/25/08    Dr.Alusio  . Joint replacement      bilateral  . 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 Colonoscopy  01/31/2009    small external/internal hemorrhoids, diverticulosis in sigmoid colon, one small polyp (biopsied). Biospy essentially normal. Dr.Magod  . Colonoscopy  04/30/2012    Procedure: COLONOSCOPY;  Surgeon: Florencia Reasons, MD;  Location: [facility] ENDOSCOPY;  Service: Endoscopy;  Laterality: N/A;  unprepped, poss flex only    History  Smoking status  . Never Smoker   Smokeless tobacco  .  Never Used    History  Alcohol Use No    Family History  Problem Relation Age of Onset  . Coronary artery disease Father   . Hypertension Father   . Stroke Father   . Diabetes Father   . Kidney failure Mother   . Hypertension Mother   . Other Brother     SEPSIS  . Diabetes Brother   . Diabetes Sister   . Kidney failure Sister     Reviw of Systems:  Reviewed in the HPI.  All other systems are negative.  Physical Exam: Blood pressure 140/74, pulse 48, height 5' 3.5" (1.613 m), weight 185 lb 6.4 oz (84.097 kg), SpO2 99.00%. General: Well developed, well nourished, in no acute distress.  Head: Normocephalic, atraumatic, sclera non-icteric, mucus membranes are moist,   Neck: Supple. Negative for carotid bruits. JVD not elevated.  Lungs: Clear bilaterally to auscultation without wheezes, rales, or rhonchi. Breathing is unlabored.  Heart: RRR with S1 S2. No murmurs, rubs, or gallops appreciated.  Abdomen: Soft, non-tender, non-distended with normoactive bowel sounds. No hepatomegaly. No rebound/guarding. No obvious abdominal masses.  Msk:  Strength and tone appear normal for age.  Extremities: No clubbing or cyanosis. No edema.  Distal pedal pulses are 2+ and equal bilaterally.  Neuro: Alert and oriented X 3. Moves all extremities spontaneously.  Psych:  Responds to questions appropriately with a normal affect.  ECG: .  Assessment / Plan:

## 2012-07-23 NOTE — Assessment & Plan Note (Signed)
Becky Forbes seems to be doing fairly well. She has a presumed nonischemic cardiomyopathy with an ejection fraction of 35-40%. We'll continue with her current medications. I suspect that some of her fatigue is caused by her congestive heart failure.  We'll continue with her current meds.  [name] on low-dose ACE inhibitor and beta blocker.

## 2012-07-23 NOTE — Patient Instructions (Addendum)
Your physician wants you to follow-up in:6 months with ekg You will receive a reminder letter in the mail two months in advance. If you don't receive a letter, please call our office to schedule the follow-up appointment.  Your physician recommends that you return for a FASTING lipid profile: 6 months   Your physician recommends that you continue on your current medications as directed. Please refer to the Current Medication list given to you today.

## 2012-07-23 NOTE — Assessment & Plan Note (Signed)
We'll check her lipids in 6 months. We'll also check liver profile and basic profile.

## 2012-07-23 NOTE — Assessment & Plan Note (Signed)
She remains very stable.  We will need to continue to be careful and watch for any signs of bleeding since she was admitted to the hospital in January with GI bleed.

## 2012-08-05 ENCOUNTER — Telehealth: Payer: Self-pay | Admitting: Internal Medicine

## 2012-08-05 ENCOUNTER — Encounter: Payer: Self-pay | Admitting: Internal Medicine

## 2012-08-05 ENCOUNTER — Ambulatory Visit (INDEPENDENT_AMBULATORY_CARE_PROVIDER_SITE_OTHER): Payer: Medicare Other | Admitting: Internal Medicine

## 2012-08-05 VITALS — BP 128/70 | HR 49 | Temp 98.0°F

## 2012-08-05 DIAGNOSIS — R350 Frequency of micturition: Secondary | ICD-10-CM | POA: Diagnosis not present

## 2012-08-05 DIAGNOSIS — R300 Dysuria: Secondary | ICD-10-CM | POA: Diagnosis not present

## 2012-08-05 DIAGNOSIS — N3090 Cystitis, unspecified without hematuria: Secondary | ICD-10-CM | POA: Diagnosis not present

## 2012-08-05 DIAGNOSIS — R3915 Urgency of urination: Secondary | ICD-10-CM

## 2012-08-05 LAB — POCT URINALYSIS DIPSTICK
Bilirubin, UA: NEGATIVE
Ketones, UA: NEGATIVE
Leukocytes, UA: NEGATIVE
Protein, UA: NEGATIVE
Spec Grav, UA: 1.01
pH, UA: 6

## 2012-08-05 MED ORDER — NITROFURANTOIN MONOHYD MACRO 100 MG PO CAPS: 100.0000 mg | ORAL_CAPSULE | Freq: Two times a day (BID) | ORAL | Status: DC

## 2012-08-05 NOTE — Telephone Encounter (Signed)
Noted, patient with pending appointment with Nicki Reaper, per Dr.Hopper's protocol if patient with pending appointment ok to close encounter

## 2012-08-05 NOTE — Progress Notes (Signed)
HPI  Pt presents to the clinic today with c/o urinary urgency, frequency and dysuria x 2 days. She denies fever, chills or back pain. She has had UTI's in the past and reports this feels the same. She has not taken anything OTC for the UTI   Review of Systems  Past Medical History  Diagnosis Date  . HTN (hypertension)   . Hyperlipemia   . Diabetes mellitus     x 10 yrs  . PONV (postoperative nausea and vomiting)   . Atrial fibrillation     CLEARED BY DR Elease Hashimoto 05/27/11  . PAF (paroxysmal atrial fibrillation) 1/14-16/14    Family History  Problem Relation Age of Onset  . Coronary artery disease Father   . Hypertension Father   . Stroke Father   . Diabetes Father   . Kidney failure Mother   . Hypertension Mother   . Other Brother     SEPSIS  . Diabetes Brother   . Diabetes Sister   . Kidney failure Sister     History   Social History  . Marital Status: Married    Spouse Name: JAMES    Number of Children: 2  . Years of Education: N/A   Occupational History  . 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 Smoking status: Never Smoker   . Smokeless tobacco: Never Used  . Alcohol Use: No  . Drug Use: No  . Sexually Active: Not Currently   Other Topics Concern  . Not on file   Social History Narrative  . No narrative on file    Allergies  Allergen Reactions  . Morphine And Related Itching, Nausea And Vomiting and Rash  . Actos (Pioglitazone Hydrochloride) Swelling  . Januvia (Sitagliptin Phosphate) Other (See Comments)    Pt says this caused elevated liver enzymes and creatinine  . Percocet (Oxycodone-Acetaminophen) Nausea And Vomiting  . Hydrocodone-Acetaminophen Itching and Nausea And Vomiting    Constitutional: Denies fever, malaise, fatigue, headache or abrupt weight changes.   GU: Pt reports urgency, frequency and pain with urination. Denies burning sensation, blood in urine, odor or discharge. Skin: Denies  redness, rashes, lesions or ulcercations.   No other specific complaints in a complete review of systems (except as listed in HPI above).    Objective:   Physical Exam  BP 128/70  Pulse 49  Temp(Src) 98 F (36.7 C) (Oral)  SpO2 97% Wt Readings from Last 3 Encounters:  07/23/12 185 lb 6.4 oz (84.097 kg)  06/22/12 187 lb 3.2 oz (84.913 kg)  06/22/12 187 lb (84.823 kg)    General: Appears her stated age, well developed, well nourished in NAD. Cardiovascular: Normal rate and rhythm. S1,S2 noted.  No murmur, rubs or gallops noted. No JVD or BLE edema. No carotid bruits noted. Pulmonary/Chest: Normal effort and positive vesicular breath sounds. No respiratory distress. No wheezes, rales or ronchi noted.  Abdomen: Soft and nontender. Normal bowel sounds, no bruits noted. No distention or masses noted. Liver, spleen and kidneys non palpable. Tender to palpation over the bladder area. No CVA tenderness.      Assessment & Plan:   Urgency, frequency and dysuria secondary to interstitial cystitis:  Urinalysis and urine culture eRx sent if for macrobid 100 mg BID x 5 days eRx sent in for Pyridium 200 mg TID prn Drink plenty of fluids  RTC as needed or if symptoms persist.

## 2012-08-05 NOTE — Patient Instructions (Addendum)

## 2012-08-05 NOTE — Telephone Encounter (Signed)
Patient Information:  Caller Name: Becky Forbes  Phone: [phone]  Patient: Becky Forbes  Gender: Female  DOB: 1941-01-28  Age: 72 Years  PCP: Marga Melnick  Office Follow Up:  Does the office need to follow up with this patient?: No  Instructions For The Office: N/A  RN Note:  No appointments available in [employer]. Scheduled with Nicki Reaper, NP with [facility]. Address and phone number of [place] office given to patient.   Symptoms  Reason For Call & Symptoms: Frequency, burning with urination. Denies blood in urine.  Reviewed Health History In EMR: Yes  Reviewed Medications In EMR: Yes  Reviewed Allergies In EMR: Yes  Reviewed Surgeries / Procedures: Yes  Date of Onset of Symptoms: 08/04/2012  Guideline(s) Used:  Urination Pain - Female  Disposition Per Guideline:   See Today in Office  Reason For Disposition Reached:   Age > 50 years  Advice Given:  N/A  Patient Will Follow Care Advice:  YES  Appointment Scheduled:  08/05/2012 14:00:00 Appointment Scheduled Provider:  Nicki Reaper

## 2012-08-05 NOTE — Addendum Note (Signed)
Addended by: Carin Primrose on: 08/05/2012 02:36 PM   Modules accepted: Orders

## 2012-09-03 ENCOUNTER — Ambulatory Visit (INDEPENDENT_AMBULATORY_CARE_PROVIDER_SITE_OTHER): Payer: Medicare Other

## 2012-09-03 DIAGNOSIS — Z7901 Long term (current) use of anticoagulants: Secondary | ICD-10-CM

## 2012-09-03 DIAGNOSIS — I4891 Unspecified atrial fibrillation: Secondary | ICD-10-CM

## 2012-09-22 ENCOUNTER — Encounter: Payer: Self-pay | Admitting: Endocrinology

## 2012-09-22 ENCOUNTER — Ambulatory Visit (INDEPENDENT_AMBULATORY_CARE_PROVIDER_SITE_OTHER): Payer: Medicare Other | Admitting: Endocrinology

## 2012-09-22 VITALS — BP 122/76 | HR 82 | Ht 66.0 in | Wt 182.0 lb

## 2012-09-22 DIAGNOSIS — E1139 Type 2 diabetes mellitus with other diabetic ophthalmic complication: Secondary | ICD-10-CM

## 2012-09-22 DIAGNOSIS — E1165 Type 2 diabetes mellitus with hyperglycemia: Secondary | ICD-10-CM

## 2012-09-22 LAB — MICROALBUMIN / CREATININE URINE RATIO
Creatinine,U: 223.4 mg/dL
Microalb Creat Ratio: 1.5 mg/g (ref 0.0–30.0)
Microalb, Ur: 3.3 mg/dL — ABNORMAL HIGH (ref 0.0–1.9)

## 2012-09-22 MED ORDER — CANAGLIFLOZIN 100 MG PO TABS: 1.0000 | ORAL_TABLET | Freq: Every day | ORAL | Status: DC

## 2012-09-22 NOTE — Progress Notes (Signed)
Subjective:    Patient ID: Becky Forbes, female    DOB: 1941/03/12, 72 y.o.   MRN: [id number]  HPI Pt returns for f/u of type 2 DM (dx'ed 1998, she has mild if any neuropathy of the lower extremities; she has associated retinopathy; she is not on metformin due to renal insuff; she had edema on actos, and diarrhea on acarbose).  pt states she feels well in general.  no cbg record, but states cbg's are well-controlled.  Past Medical History  Diagnosis Date  . HTN (hypertension)   . Hyperlipemia   . Diabetes mellitus     x 10 yrs  . PONV (postoperative nausea and vomiting)   . Atrial fibrillation     CLEARED BY DR Elease Hashimoto 05/27/11  . PAF (paroxysmal atrial fibrillation) 1/14-16/14    Past Surgical History  Procedure Laterality Date  . Cataract extraction  05/25/07    RIGHT EYE  . Appendectomy    . Carpal tunnel release    . Abdominal hysterectomy    . Lumbar laminectomy    . Foot surgery  2006    MID FOOT RECONSTRUCTION  . Osteotomy  2006    AND FUSION  . Ganglion cyst excision  12/2007    Dr.Gramig  . Total knee arthroplasty  05/02/08, 07/25/08    Dr.Alusio  . Joint replacement      bilateral  . 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 Colonoscopy  01/31/2009    small external/internal hemorrhoids, diverticulosis in sigmoid colon, one small polyp (biopsied). Biospy essentially normal. Dr.Magod  . Colonoscopy  04/30/2012    Procedure: COLONOSCOPY;  Surgeon: Florencia Reasons, MD;  Location: [facility];  Service: Endoscopy;  Laterality: N/A;  unprepped, poss flex only    History   Social History  . Marital Status: Married    Spouse Name: JAMES    Number of Children: 2  . Years of Education: N/A   Occupational History  . 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 Smoking status: Never Smoker   . Smokeless tobacco: Never Used  . Alcohol Use: No  . Drug Use: No  . Sexually Active: Not Currently   Other Topics  Concern  . Not on file   Social History Narrative  . No narrative on file    Current Outpatient Prescriptions on File Prior to Visit  Medication Sig Dispense Refill  . bromocriptine (PARLODEL) 2.5 MG tablet Take 1 tablet (2.5 mg total) by mouth daily.  90 tablet  3  . calcium carbonate (OS-CAL) 600 MG TABS Take 600 mg by mouth daily.       . Cholecalciferol (VITAMIN D PO) Take 800 Units by mouth daily.        . furosemide (LASIX) 20 MG tablet Take 1 tablet (20 mg total) by mouth daily as needed.  30 tablet  5  . glucose blood (BAYER CONTOUR NEXT TEST) test strip Use as instructed  100 each  12  . glucose blood test strip 1 each by Other route as needed for other. Use as instructed      . metoprolol tartrate (LOPRESSOR) 50 MG tablet Take 1 tablet (50 mg total) by mouth 2 (two) times daily.  60 tablet  5  . Multiple Vitamin (MULITIVITAMIN WITH MINERALS) TABS Take 1 tablet by mouth daily.      . nateglinide (STARLIX) 120 MG tablet Take 1 tablet (  120 mg total) by mouth 3 (three) times daily before meals.  270 tablet  2  . nitrofurantoin, macrocrystal-monohydrate, (MACROBID) 100 MG capsule Take 1 capsule (100 mg total) by mouth 2 (two) times daily.  10 capsule  0  . polyethylene glycol powder (GLYCOLAX/MIRALAX) powder Take 17 g by mouth as needed.      . ramipril (ALTACE) 10 MG capsule Take 1 capsule (10 mg total) by mouth daily.  90 capsule  1  . saxagliptin HCl (ONGLYZA) 5 MG TABS tablet Take 5 mg by mouth daily.      . simvastatin (ZOCOR) 20 MG tablet LABS OVERDUE,  30 tablet  0  . warfarin (COUMADIN) 5 MG tablet Pt taking 2.5 mg m.w, f    5mg  t,th,sat, sun       No current facility-administered medications on file prior to visit.    Allergies  Allergen Reactions  . Morphine And Related Itching, Nausea And Vomiting and Rash  . Actos (Pioglitazone Hydrochloride) Swelling  . Januvia (Sitagliptin Phosphate) Other (See Comments)    Pt says this caused elevated liver enzymes and creatinine   . Percocet (Oxycodone-Acetaminophen) Nausea And Vomiting  . Hydrocodone-Acetaminophen Itching and Nausea And Vomiting    Family History  Problem Relation Age of Onset  . Coronary artery disease Father   . Hypertension Father   . Stroke Father   . Diabetes Father   . Kidney failure Mother   . Hypertension Mother   . Other Brother     SEPSIS  . Diabetes Brother   . Diabetes Sister   . Kidney failure Sister     BP 122/76  Pulse 82  Ht 5\' 6"  (1.676 m)  Wt 182 lb (82.555 kg)  BMI 29.39 kg/m2  SpO2 97%  Review of Systems denies hypoglycemia and weight change.     Objective:   Physical Exam VITAL SIGNS:  See vs page GENERAL: no distress  Lab Results  Component Value Date   HGBA1C 7.8* 09/22/2012      Assessment & Plan:  DM: Needs increased rx, if it can be done with a regimen that avoids or minimizes hypoglycemia.  We discussed the nine oral agents available for type 2 diabetes.  This regimen gives the best risk-benefit ratio.

## 2012-09-22 NOTE — Patient Instructions (Addendum)
blood tests are being requested for you today.  We'll contact you with results.   If the blood sugar is high, we'll add "invokana." check your blood sugar once a day.  vary the time of day when you check, between before the 3 meals, and at bedtime.  also check if you have symptoms of your blood sugar being too high or too low.  please keep a record of the readings and bring it to your next appointment here.  please call us sooner if your blood sugar goes below 70, or if it stays over 200.  Please come back for a follow-up appointment in 3 months.

## 2012-09-25 ENCOUNTER — Other Ambulatory Visit: Payer: Self-pay | Admitting: Neurosurgery

## 2012-09-25 DIAGNOSIS — M5020 Other cervical disc displacement, unspecified cervical region: Secondary | ICD-10-CM | POA: Diagnosis not present

## 2012-09-25 DIAGNOSIS — M47812 Spondylosis without myelopathy or radiculopathy, cervical region: Secondary | ICD-10-CM | POA: Diagnosis not present

## 2012-09-25 DIAGNOSIS — M542 Cervicalgia: Secondary | ICD-10-CM | POA: Diagnosis not present

## 2012-09-25 DIAGNOSIS — M5090 Cervical disc disorder, unspecified, unspecified cervical region: Secondary | ICD-10-CM

## 2012-09-25 DIAGNOSIS — M5412 Radiculopathy, cervical region: Secondary | ICD-10-CM | POA: Diagnosis not present

## 2012-09-26 ENCOUNTER — Ambulatory Visit
Admission: RE | Admit: 2012-09-26 | Discharge: 2012-09-26 | Disposition: A | Discharge status: Home / Self Care | Payer: Medicare Other | Source: Ambulatory Visit | Attending: Neurosurgery | Admitting: Neurosurgery

## 2012-09-26 DIAGNOSIS — M5090 Cervical disc disorder, unspecified, unspecified cervical region: Secondary | ICD-10-CM

## 2012-09-26 DIAGNOSIS — M47812 Spondylosis without myelopathy or radiculopathy, cervical region: Secondary | ICD-10-CM | POA: Diagnosis not present

## 2012-09-26 DIAGNOSIS — M4802 Spinal stenosis, cervical region: Secondary | ICD-10-CM | POA: Diagnosis not present

## 2012-09-28 ENCOUNTER — Other Ambulatory Visit: Payer: Self-pay

## 2012-09-28 MED ORDER — CANAGLIFLOZIN 100 MG PO TABS: 1.0000 | ORAL_TABLET | Freq: Every day | ORAL | Status: DC

## 2012-09-29 ENCOUNTER — Telehealth: Payer: Self-pay | Admitting: Endocrinology

## 2012-09-29 DIAGNOSIS — M5412 Radiculopathy, cervical region: Secondary | ICD-10-CM | POA: Diagnosis not present

## 2012-09-29 DIAGNOSIS — M5020 Other cervical disc displacement, unspecified cervical region: Secondary | ICD-10-CM | POA: Diagnosis not present

## 2012-09-29 DIAGNOSIS — M542 Cervicalgia: Secondary | ICD-10-CM | POA: Diagnosis not present

## 2012-09-29 DIAGNOSIS — M47812 Spondylosis without myelopathy or radiculopathy, cervical region: Secondary | ICD-10-CM | POA: Diagnosis not present

## 2012-09-29 NOTE — Telephone Encounter (Signed)
Pt advised.

## 2012-09-29 NOTE — Telephone Encounter (Signed)
Patient left voice message stating that the prescription that was called in yesterday by Dr. Everardo All is not covered by her insurance and is too expensive for her to pay outright. She would like to know if there is another medication she could use?

## 2012-09-29 NOTE — Telephone Encounter (Signed)
Please hold-off on the invokana, and continue other meds. Please come back for a follow-up appointment in 3 months

## 2012-10-19 ENCOUNTER — Ambulatory Visit (INDEPENDENT_AMBULATORY_CARE_PROVIDER_SITE_OTHER): Payer: Medicare Other | Admitting: *Deleted

## 2012-10-19 DIAGNOSIS — Z7901 Long term (current) use of anticoagulants: Secondary | ICD-10-CM | POA: Diagnosis not present

## 2012-10-19 DIAGNOSIS — I4891 Unspecified atrial fibrillation: Secondary | ICD-10-CM

## 2012-11-02 ENCOUNTER — Ambulatory Visit (INDEPENDENT_AMBULATORY_CARE_PROVIDER_SITE_OTHER): Payer: Medicare Other | Admitting: *Deleted

## 2012-11-02 DIAGNOSIS — I4891 Unspecified atrial fibrillation: Secondary | ICD-10-CM

## 2012-11-02 DIAGNOSIS — Z7901 Long term (current) use of anticoagulants: Secondary | ICD-10-CM | POA: Diagnosis not present

## 2012-11-02 LAB — POCT INR: INR: 2.5

## 2012-11-09 ENCOUNTER — Other Ambulatory Visit: Payer: Self-pay | Admitting: Cardiovascular Disease

## 2012-11-09 ENCOUNTER — Other Ambulatory Visit: Payer: Self-pay | Admitting: Internal Medicine

## 2012-11-10 NOTE — Telephone Encounter (Signed)
Future labs already ordered

## 2012-11-16 ENCOUNTER — Ambulatory Visit (INDEPENDENT_AMBULATORY_CARE_PROVIDER_SITE_OTHER): Payer: Medicare Other | Admitting: *Deleted

## 2012-11-16 DIAGNOSIS — Z7901 Long term (current) use of anticoagulants: Secondary | ICD-10-CM | POA: Diagnosis not present

## 2012-11-16 DIAGNOSIS — I4891 Unspecified atrial fibrillation: Secondary | ICD-10-CM

## 2012-11-16 LAB — POCT INR: INR: 2.7

## 2012-11-21 ENCOUNTER — Other Ambulatory Visit: Payer: Self-pay | Admitting: Internal Medicine

## 2012-11-23 NOTE — Telephone Encounter (Signed)
Future orders already placed

## 2012-11-27 ENCOUNTER — Other Ambulatory Visit: Payer: Self-pay | Admitting: *Deleted

## 2012-11-27 MED ORDER — SIMVASTATIN 20 MG PO TABS: ORAL_TABLET | ORAL | Status: DC

## 2012-12-01 ENCOUNTER — Ambulatory Visit (INDEPENDENT_AMBULATORY_CARE_PROVIDER_SITE_OTHER): Payer: Medicare Other | Admitting: *Deleted

## 2012-12-01 DIAGNOSIS — Z7901 Long term (current) use of anticoagulants: Secondary | ICD-10-CM | POA: Diagnosis not present

## 2012-12-01 DIAGNOSIS — I4891 Unspecified atrial fibrillation: Secondary | ICD-10-CM

## 2012-12-01 LAB — POCT INR: INR: 2.1

## 2012-12-22 ENCOUNTER — Ambulatory Visit: Payer: Medicare Other | Admitting: Endocrinology

## 2012-12-29 ENCOUNTER — Ambulatory Visit (INDEPENDENT_AMBULATORY_CARE_PROVIDER_SITE_OTHER): Payer: Medicare Other

## 2012-12-29 DIAGNOSIS — I4891 Unspecified atrial fibrillation: Secondary | ICD-10-CM | POA: Diagnosis not present

## 2012-12-29 DIAGNOSIS — Z7901 Long term (current) use of anticoagulants: Secondary | ICD-10-CM

## 2012-12-29 LAB — POCT INR: INR: 2.9

## 2012-12-30 DIAGNOSIS — M5020 Other cervical disc displacement, unspecified cervical region: Secondary | ICD-10-CM | POA: Diagnosis not present

## 2012-12-30 DIAGNOSIS — M47812 Spondylosis without myelopathy or radiculopathy, cervical region: Secondary | ICD-10-CM | POA: Diagnosis not present

## 2012-12-30 DIAGNOSIS — M542 Cervicalgia: Secondary | ICD-10-CM | POA: Diagnosis not present

## 2012-12-30 DIAGNOSIS — M5412 Radiculopathy, cervical region: Secondary | ICD-10-CM | POA: Diagnosis not present

## 2013-01-06 ENCOUNTER — Encounter: Payer: Self-pay | Admitting: Endocrinology

## 2013-01-06 ENCOUNTER — Ambulatory Visit (INDEPENDENT_AMBULATORY_CARE_PROVIDER_SITE_OTHER): Payer: Medicare Other | Admitting: Endocrinology

## 2013-01-06 ENCOUNTER — Other Ambulatory Visit: Payer: Self-pay | Admitting: Endocrinology

## 2013-01-06 VITALS — BP 136/78 | HR 90 | Ht 64.0 in | Wt 189.0 lb

## 2013-01-06 DIAGNOSIS — E1139 Type 2 diabetes mellitus with other diabetic ophthalmic complication: Secondary | ICD-10-CM

## 2013-01-06 MED ORDER — COLESEVELAM HCL 625 MG PO TABS: 1875.0000 mg | ORAL_TABLET | Freq: Two times a day (BID) | ORAL | Status: DC

## 2013-01-06 NOTE — Progress Notes (Signed)
Subjective:    Patient ID: Becky Forbes, female    DOB: Feb 10, 1941, 72 y.o.   MRN: [id number]  HPI Pt returns for f/u of type 2 DM (dx'ed 1998, she has mild if any neuropathy of the lower extremities; she has associated retinopathy; she is not on metformin due to renal insuff; she had edema on actos, and diarrhea on acarbose; she could not afford invokana).  pt states she feels well in general.  no cbg record, but states cbg's are well-controlled.   Past Medical History  Diagnosis Date  . HTN (hypertension)   . Hyperlipemia   . Diabetes mellitus     x 10 yrs  . PONV (postoperative nausea and vomiting)   . Atrial fibrillation     CLEARED BY DR Elease Hashimoto 05/27/11  . PAF (paroxysmal atrial fibrillation) 1/14-16/14    Past Surgical History  Procedure Laterality Date  . Cataract extraction  05/25/07    RIGHT EYE  . Appendectomy    . Carpal tunnel release    . Abdominal hysterectomy    . Lumbar laminectomy    . Foot surgery  2006    MID FOOT RECONSTRUCTION  . Osteotomy  2006    AND FUSION  . Ganglion cyst excision  12/2007    Dr.Gramig  . Total knee arthroplasty  05/02/08, 07/25/08    Dr.Alusio  . Joint replacement      bilateral  . 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 Colonoscopy  01/31/2009    small external/internal hemorrhoids, diverticulosis in sigmoid colon, one small polyp (biopsied). Biospy essentially normal. Dr.Magod  . Colonoscopy  04/30/2012    Procedure: COLONOSCOPY;  Surgeon: Florencia Reasons, MD;  Location: [facility] ENDOSCOPY;  Service: Endoscopy;  Laterality: N/A;  unprepped, poss flex only    History   Social History  . Marital Status: Married    Spouse Name: JAMES    Number of Children: 2  . Years of Education: N/A   Occupational History  . 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 Smoking status: Never Smoker   . Smokeless tobacco: Never Used  . Alcohol Use: No  . Drug Use: No  . Sexual Activity:  Not Currently   Other Topics Concern  . Not on file   Social History Narrative  . No narrative on file    Current Outpatient Prescriptions on File Prior to Visit  Medication Sig Dispense Refill  . bromocriptine (PARLODEL) 2.5 MG tablet Take 1 tablet (2.5 mg total) by mouth daily.  90 tablet  3  . calcium carbonate (OS-CAL) 600 MG TABS Take 600 mg by mouth daily.       . Cholecalciferol (VITAMIN D PO) Take 800 Units by mouth daily.        . furosemide (LASIX) 20 MG tablet Take 1 tablet (20 mg total) by mouth daily as needed.  30 tablet  5  . furosemide (LASIX) 20 MG tablet TAKE ONE TABLET BY MOUTH DAILY.  30 tablet  6  . glucose blood (BAYER CONTOUR NEXT TEST) test strip Use as instructed  100 each  12  . glucose blood test strip 1 each by Other route as needed for other. Use as instructed      . metoprolol (LOPRESSOR) 50 MG tablet TAKE ONE TABLET BY MOUTH TWICE DAILY  60 tablet  6  . Multiple Vitamin (MULITIVITAMIN WITH MINERALS) TABS Take 1  tablet by mouth daily.      . nateglinide (STARLIX) 120 MG tablet Take 1 tablet (120 mg total) by mouth 3 (three) times daily before meals.  270 tablet  2  . polyethylene glycol powder (GLYCOLAX/MIRALAX) powder Take 17 g by mouth as needed.      . ramipril (ALTACE) 10 MG capsule Take 1 capsule (10 mg total) by mouth daily.  90 capsule  1  . saxagliptin HCl (ONGLYZA) 5 MG TABS tablet Take 5 mg by mouth daily.      . simvastatin (ZOCOR) 20 MG tablet Labs overdue, 1 by mouth at bedtime  90 tablet  2  . warfarin (COUMADIN) 5 MG tablet Pt taking 2.5 mg m.w, f    5mg  t,th,sat, sun       No current facility-administered medications on file prior to visit.    Allergies  Allergen Reactions  . Morphine And Related Itching, Nausea And Vomiting and Rash  . Actos [Pioglitazone Hydrochloride] Swelling  . Januvia [Sitagliptin Phosphate] Other (See Comments)    Pt says this caused elevated liver enzymes and creatinine  . Percocet [Oxycodone-Acetaminophen]  Nausea And Vomiting  . Hydrocodone-Acetaminophen Itching and Nausea And Vomiting    Family History  Problem Relation Age of Onset  . Coronary artery disease Father   . Hypertension Father   . Stroke Father   . Diabetes Father   . Kidney failure Mother   . Hypertension Mother   . Other Brother     SEPSIS  . Diabetes Brother   . Diabetes Sister   . Kidney failure Sister    BP 136/78  Pulse 90  Ht 5\' 4"  (1.626 m)  Wt 189 lb (85.73 kg)  BMI 32.43 kg/m2  SpO2 98%  Review of Systems denies hypoglycemia.  She has gained weight.    Objective:   Physical Exam VITAL SIGNS:  See vs page GENERAL: no distress.     Assessment & Plan:  DM: she may need insulin if she cannot afford the welchol Renal insuff: this prevents rx with metformin. Edema: this limits rx with actos.

## 2013-01-06 NOTE — Patient Instructions (Addendum)
blood tests are being requested for you today.  We'll contact you with results.   If the blood sugar is high, we'll add "welchol."  If this does not help, you should consider taking insulin.   check your blood sugar once a day.  vary the time of day when you check, between before the 3 meals, and at bedtime.  also check if you have symptoms of your blood sugar being too high or too low.  please keep a record of the readings and bring it to your next appointment here.  please call us sooner if your blood sugar goes below 70, or if it stays over 200.  Please come back for a follow-up appointment in 3 months.

## 2013-01-21 DIAGNOSIS — H35349 Macular cyst, hole, or pseudohole, unspecified eye: Secondary | ICD-10-CM | POA: Diagnosis not present

## 2013-01-21 DIAGNOSIS — E1139 Type 2 diabetes mellitus with other diabetic ophthalmic complication: Secondary | ICD-10-CM | POA: Diagnosis not present

## 2013-01-21 DIAGNOSIS — H35079 Retinal telangiectasis, unspecified eye: Secondary | ICD-10-CM | POA: Diagnosis not present

## 2013-01-21 DIAGNOSIS — E11339 Type 2 diabetes mellitus with moderate nonproliferative diabetic retinopathy without macular edema: Secondary | ICD-10-CM | POA: Diagnosis not present

## 2013-01-21 DIAGNOSIS — E11311 Type 2 diabetes mellitus with unspecified diabetic retinopathy with macular edema: Secondary | ICD-10-CM | POA: Diagnosis not present

## 2013-01-26 ENCOUNTER — Ambulatory Visit (INDEPENDENT_AMBULATORY_CARE_PROVIDER_SITE_OTHER): Payer: Medicare Other | Admitting: *Deleted

## 2013-01-26 DIAGNOSIS — Z7901 Long term (current) use of anticoagulants: Secondary | ICD-10-CM

## 2013-01-26 DIAGNOSIS — I4891 Unspecified atrial fibrillation: Secondary | ICD-10-CM | POA: Diagnosis not present

## 2013-01-26 DIAGNOSIS — Z23 Encounter for immunization: Secondary | ICD-10-CM | POA: Diagnosis not present

## 2013-01-26 LAB — POCT INR: INR: 4.1

## 2013-02-04 ENCOUNTER — Ambulatory Visit (INDEPENDENT_AMBULATORY_CARE_PROVIDER_SITE_OTHER): Payer: Medicare Other | Admitting: Pharmacist

## 2013-02-04 ENCOUNTER — Ambulatory Visit (INDEPENDENT_AMBULATORY_CARE_PROVIDER_SITE_OTHER): Payer: Medicare Other | Admitting: Cardiovascular Disease

## 2013-02-04 ENCOUNTER — Encounter: Payer: Self-pay | Admitting: Cardiovascular Disease

## 2013-02-04 ENCOUNTER — Telehealth: Payer: Self-pay | Admitting: Endocrinology

## 2013-02-04 VITALS — BP 142/72 | HR 90 | Ht 64.0 in | Wt 191.0 lb

## 2013-02-04 DIAGNOSIS — E1139 Type 2 diabetes mellitus with other diabetic ophthalmic complication: Secondary | ICD-10-CM | POA: Diagnosis not present

## 2013-02-04 DIAGNOSIS — I4891 Unspecified atrial fibrillation: Secondary | ICD-10-CM

## 2013-02-04 DIAGNOSIS — E782 Mixed hyperlipidemia: Secondary | ICD-10-CM

## 2013-02-04 DIAGNOSIS — Z7901 Long term (current) use of anticoagulants: Secondary | ICD-10-CM

## 2013-02-04 DIAGNOSIS — I10 Essential (primary) hypertension: Secondary | ICD-10-CM

## 2013-02-04 LAB — POCT INR: INR: 2.5

## 2013-02-04 MED ORDER — METOPROLOL TARTRATE 100 MG PO TABS: 100.0000 mg | ORAL_TABLET | Freq: Two times a day (BID) | ORAL | Status: DC

## 2013-02-04 NOTE — Assessment & Plan Note (Signed)
Becky Forbes continues to eat some extra salt on occasion. We'll give her some information on a low-salt diet. We'll continue with the current dose of Lasix.  We will increase the metoprolol to 100 mg twice a day. This will help with a rate control and should also help with her congestive heart failure.

## 2013-02-04 NOTE — Assessment & Plan Note (Signed)
Today she presents in atrial flutter. He's completely asymptomatic. I do not think that she needs to be cardioverted since she has known paroxysmal atrial fibrillation. My hope is that she will possibly convert back to normal rhythm with increased metoprolol.

## 2013-02-04 NOTE — Progress Notes (Signed)
Becky Forbes Date of Birth  October 15, 1940 [facility]     [place] [facility]  [address]    [address]   [address] [place], [place]  [address]    [place], [place]  [address] [phone]  Fax  [phone]  [phone]  Fax [phone]  Problem list: 1. Atrial fibrillation / atrial Flutter - paroxysmal 2. Diabetes mellitus 3. Hypertension 4. Hyperlipidemia 5. Chronic systolic CHF - EF 35-40%  (assumed to be nonischemic)   History of Present Illness:  [name] not had any episodes of chest pain or shortness of breath.   She had back surgery earlier this year. [name] not had any chest pain or shortness breath. Her blood pressure has been well controlled.  [name] not had any episodes of atrial fibrillation.  Feb. 4, 2014: She was admitted to the hospital in mid January with a GI bleed and was noted to be in atrial fibrillation.  She had an echocardiogram which revealed an ejection fraction of 35-40%. She started on Lasix 20 twice a day and metoprolol. She had some blood work drawn last week which revealed a mild increase in her creatinine up to 1.9. She was also found to have a potassium of 6.1. Recheck potassium level was 4.6.  [name] noted that her blood pressures been quite low since she left the hospital. She also was having symptoms of orthostatic hypotension.  July 23, 2012:  Becky Forbes is doing well.  No further episodes of Afib and no blood in stool.  She was admitted to the hospital in January with GI bleeding was found to have atrial fibrillation. Workup at that time revealed what appeared to be ischemic colitis. Her aspirin was held for a week. She improved and has not had any further bleeding. We have been able to restart Coumadin she's not had any bleeding. The bruising that she was having is now better since she has been off the aspirin.  She has a nonischemic cardiomyopathy. Echocardiogram in January revealed an ejection fraction of 35-40%. She has  moderate pulmonary  hypertension with an estimated PA pressure of 49. I have  reviewed the echo and the results are noted below. Left ventricle: The cavity size was normal. Wall thickness was increased in a pattern of mild LVH. Systolic function was moderately reduced. The estimated ejection fraction was in the range of 35% to 40%. Diffuse hypokinesis. - Mitral valve: Mild regurgitation. - Left atrium: The atrium was mildly dilated. - Right ventricle: The cavity size was mildly dilated. Systolic function was mildly reduced. - Right atrium: The atrium was mildly dilated. - Pulmonary arteries: Systolic pressure was moderately increased. PA peak pressure: 49mm Hg   She continues to have generalized fatigue and malaise.  She has chronic renal insufficiency.  Oct. 23, 2014:  Becky Forbes is in atrial flutter today.  No symptoms.  breathing is ok.  She walks on occasion.  [name] not able to tell if her heart rate is irregular or not.  Current Outpatient Prescriptions on File Prior to Visit  Medication Sig Dispense Refill  . bromocriptine (PARLODEL) 2.5 MG tablet Take 1 tablet (2.5 mg total) by mouth daily.  90 tablet  3  . calcium carbonate (OS-CAL) 600 MG TABS Take 600 mg by mouth daily.       . Cholecalciferol (VITAMIN D PO) Take 800 Units by mouth daily.        . colesevelam (WELCHOL) 625 MG tablet Take 3 tablets (1,875 mg total) by mouth  2 (two) times daily with a meal.  180 tablet  11  . furosemide (LASIX) 20 MG tablet Take 1 tablet (20 mg total) by mouth daily as needed.  30 tablet  5  . furosemide (LASIX) 20 MG tablet TAKE ONE TABLET BY MOUTH DAILY.  30 tablet  6  . glucose blood (BAYER CONTOUR NEXT TEST) test strip Use as instructed  100 each  12  . glucose blood test strip 1 each by Other route as needed for other. Use as instructed      . metoprolol (LOPRESSOR) 50 MG tablet TAKE ONE TABLET BY MOUTH TWICE DAILY  60 tablet  6  . Multiple Vitamin (MULITIVITAMIN WITH MINERALS) TABS Take 1 tablet by mouth daily.       . nateglinide (STARLIX) 120 MG tablet Take 1 tablet (120 mg total) by mouth 3 (three) times daily before meals.  270 tablet  2  . polyethylene glycol powder (GLYCOLAX/MIRALAX) powder Take 17 g by mouth as needed.      . ramipril (ALTACE) 10 MG capsule Take 1 capsule (10 mg total) by mouth daily.  90 capsule  1  . saxagliptin HCl (ONGLYZA) 5 MG TABS tablet Take 5 mg by mouth daily.      . simvastatin (ZOCOR) 20 MG tablet Labs overdue, 1 by mouth at bedtime  90 tablet  2  . warfarin (COUMADIN) 5 MG tablet Pt taking 2.5 mg m.w, f    5mg  t,th,sat, sun       No current facility-administered medications on file prior to visit.    Allergies  Allergen Reactions  . Morphine And Related Itching, Nausea And Vomiting and Rash  . Actos [Pioglitazone Hydrochloride] Swelling  . Januvia [Sitagliptin Phosphate] Other (See Comments)    Pt says this caused elevated liver enzymes and creatinine  . Percocet [Oxycodone-Acetaminophen] Nausea And Vomiting  . Hydrocodone-Acetaminophen Itching and Nausea And Vomiting    Past Medical History  Diagnosis Date  . HTN (hypertension)   . Hyperlipemia   . Diabetes mellitus     x 10 yrs  . PONV (postoperative nausea and vomiting)   . Atrial fibrillation     CLEARED BY DR Elease Hashimoto 05/27/11  . PAF (paroxysmal atrial fibrillation) 1/14-16/14    Past Surgical History  Procedure Laterality Date  . Cataract extraction  05/25/07    RIGHT EYE  . Appendectomy    . Carpal tunnel release    . Abdominal hysterectomy    . Lumbar laminectomy    . Foot surgery  2006    MID FOOT RECONSTRUCTION  . Osteotomy  2006    AND FUSION  . Ganglion cyst excision  12/2007    Dr.Gramig  . Total knee arthroplasty  05/02/08, 07/25/08    Dr.Alusio  . Joint replacement      bilateral  . 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 Colonoscopy  01/31/2009    small external/internal hemorrhoids, diverticulosis in sigmoid colon, one small polyp (biopsied). Biospy essentially  normal. Dr.Magod  . Colonoscopy  04/30/2012    Procedure: COLONOSCOPY;  Surgeon: Florencia Reasons, MD;  Location: [facility] ENDOSCOPY;  Service: Endoscopy;  Laterality: N/A;  unprepped, poss flex only    History  Smoking status  . Never Smoker   Smokeless tobacco  . Never Used    History  Alcohol Use No    Family History  Problem Relation Age of Onset  . Coronary artery disease Father   . Hypertension Father   .  Stroke Father   . Diabetes Father   . Kidney failure Mother   . Hypertension Mother   . Other Brother     SEPSIS  . Diabetes Brother   . Diabetes Sister   . Kidney failure Sister     Reviw of Systems:  Reviewed in the HPI.  All other systems are negative.  Physical Exam: Blood pressure 142/72, pulse 90, height 5\' 4"  (1.626 m), weight 191 lb (86.637 kg). General: Well developed, well nourished, in no acute distress.  Head: Normocephalic, atraumatic, sclera non-icteric, mucus membranes are moist,   Neck: Supple. Negative for carotid bruits. JVD not elevated.  Lungs: Clear bilaterally to auscultation without wheezes, rales, or rhonchi. Breathing is unlabored.  Heart: RRR with S1 S2. No murmurs, rubs, or gallops appreciated.  Abdomen: Soft, non-tender, non-distended with normoactive bowel sounds. No hepatomegaly. No rebound/guarding. No obvious abdominal masses.  Msk:  Strength and tone appear normal for age.  Extremities: No clubbing or cyanosis. No edema.  Distal pedal pulses are 2+ and equal bilaterally.  Neuro: Alert and oriented X 3. Moves all extremities spontaneously.  Psych:  Responds to questions appropriately with a normal affect.  ECG: Oct. 23, 2014:  Atrial flutter with variable AV block  Assessment / Plan:

## 2013-02-04 NOTE — Patient Instructions (Signed)
Your physician wants you to follow-up in: 6 months after echo//  You will receive a reminder letter in the mail two months in advance. If you don't receive a letter, please call our office to schedule the follow-up appointment.  Your physician has requested that you have an echocardiogram in 6 months . Echocardiography is a painless test that uses sound waves to create images of your heart. It provides your doctor with information about the size and shape of your heart and how well your heart's chambers and valves are working. This procedure takes approximately one hour. There are no restrictions for this procedure.  Your physician has recommended you make the following change in your medication:  INCREASE METOPROLOL TO 100 MG TWICE DAILY 12 HOURS APART, USE CURRENT 50 MG BY TAKING 2 TABLETS TWICE DAILY TILL GONE// NEW STRENGTH WILL BE AT [facility]  REDUCE HIGH SODIUM FOODS LIKE CANNED SOUP, GRAVY, SAUCES, READY PREPARED FOODS LIKE FROZEN FOODS; LEAN CUISINE, LASAGNA. BACON, SAUSAGE, LUNCH MEAT, FAST FOODS, HOT DOGS, CHIPS, PIZZA, CHINESE FOOD, SOY SAUCE, STORE BOUGHT FRIED CHICKEN= [place] FRIED CHICKEN/ [employer]JANGLES.

## 2013-02-04 NOTE — Telephone Encounter (Signed)
Pt advised no samples.

## 2013-02-25 ENCOUNTER — Ambulatory Visit (INDEPENDENT_AMBULATORY_CARE_PROVIDER_SITE_OTHER): Payer: Medicare Other | Admitting: *Deleted

## 2013-02-25 DIAGNOSIS — Z7901 Long term (current) use of anticoagulants: Secondary | ICD-10-CM | POA: Diagnosis not present

## 2013-02-25 DIAGNOSIS — I4891 Unspecified atrial fibrillation: Secondary | ICD-10-CM

## 2013-03-10 ENCOUNTER — Ambulatory Visit (INDEPENDENT_AMBULATORY_CARE_PROVIDER_SITE_OTHER): Payer: Medicare Other | Admitting: *Deleted

## 2013-03-10 DIAGNOSIS — Z5181 Encounter for therapeutic drug level monitoring: Secondary | ICD-10-CM

## 2013-03-10 DIAGNOSIS — Z7901 Long term (current) use of anticoagulants: Secondary | ICD-10-CM

## 2013-03-10 DIAGNOSIS — I4891 Unspecified atrial fibrillation: Secondary | ICD-10-CM | POA: Diagnosis not present

## 2013-03-10 LAB — POCT INR: INR: 1.6

## 2013-03-22 ENCOUNTER — Other Ambulatory Visit: Payer: Self-pay | Admitting: Internal Medicine

## 2013-03-22 ENCOUNTER — Other Ambulatory Visit: Payer: Self-pay | Admitting: Cardiovascular Disease

## 2013-03-22 ENCOUNTER — Other Ambulatory Visit: Payer: Self-pay | Admitting: *Deleted

## 2013-03-22 NOTE — Telephone Encounter (Signed)
Ramipril refilled per protocol. OV due 

## 2013-03-24 ENCOUNTER — Ambulatory Visit (INDEPENDENT_AMBULATORY_CARE_PROVIDER_SITE_OTHER): Payer: Medicare Other | Admitting: *Deleted

## 2013-03-24 DIAGNOSIS — Z7901 Long term (current) use of anticoagulants: Secondary | ICD-10-CM | POA: Diagnosis not present

## 2013-03-24 DIAGNOSIS — I4891 Unspecified atrial fibrillation: Secondary | ICD-10-CM

## 2013-03-24 LAB — POCT INR: INR: 3.1

## 2013-03-25 ENCOUNTER — Other Ambulatory Visit: Payer: Self-pay

## 2013-03-25 MED ORDER — FUROSEMIDE 20 MG PO TABS: 20.0000 mg | ORAL_TABLET | Freq: Every day | ORAL | Status: DC | PRN

## 2013-03-29 ENCOUNTER — Other Ambulatory Visit: Payer: Self-pay

## 2013-03-29 MED ORDER — FUROSEMIDE 20 MG PO TABS: 20.0000 mg | ORAL_TABLET | Freq: Every day | ORAL | Status: DC | PRN

## 2013-04-09 ENCOUNTER — Ambulatory Visit (INDEPENDENT_AMBULATORY_CARE_PROVIDER_SITE_OTHER): Payer: Medicare Other | Admitting: *Deleted

## 2013-04-09 DIAGNOSIS — I4891 Unspecified atrial fibrillation: Secondary | ICD-10-CM | POA: Diagnosis not present

## 2013-04-09 DIAGNOSIS — Z7901 Long term (current) use of anticoagulants: Secondary | ICD-10-CM | POA: Diagnosis not present

## 2013-04-09 LAB — POCT INR: INR: 2.8

## 2013-04-19 ENCOUNTER — Ambulatory Visit (INDEPENDENT_AMBULATORY_CARE_PROVIDER_SITE_OTHER): Payer: Medicare Other | Admitting: Endocrinology

## 2013-04-19 ENCOUNTER — Encounter: Payer: Self-pay | Admitting: Endocrinology

## 2013-04-19 VITALS — BP 120/84 | HR 106 | Temp 98.1°F | Ht 64.0 in | Wt 192.0 lb

## 2013-04-19 DIAGNOSIS — E1165 Type 2 diabetes mellitus with hyperglycemia: Principal | ICD-10-CM

## 2013-04-19 DIAGNOSIS — E1139 Type 2 diabetes mellitus with other diabetic ophthalmic complication: Secondary | ICD-10-CM | POA: Diagnosis not present

## 2013-04-19 LAB — HEMOGLOBIN A1C: HEMOGLOBIN A1C: 7.6 % — AB (ref 4.6–6.5)

## 2013-04-19 NOTE — Patient Instructions (Addendum)
blood tests are being requested for you today.  We'll contact you with results.   If it is high, you should consider taking insulin.   check your blood sugar once a day.  vary the time of day when you check, between before the 3 meals, and at bedtime.  also check if you have symptoms of your blood sugar being too high or too low.  please keep a record of the readings and bring it to your next appointment here.  please call us sooner if your blood sugar goes below 70, or if it stays over 200.  Please come back for a follow-up appointment in 3 months.

## 2013-04-19 NOTE — Progress Notes (Signed)
Subjective:    Patient ID: Becky Forbes, female    DOB: 06/01/1940, 73 y.o.   MRN: [id number]  HPI Pt returns for f/u of type 2 DM (dx'ed 1998, on a routine blood test; she has mild if any neuropathy of the lower extremities, but she has associated retinopathy; she is not on metformin due to renal insuff; she had edema on actos, and diarrhea on acarbose; she could not afford invokana).  pt states she feels well in general.  no cbg record, but states cbg's are well-controlled.   Past Medical History  Diagnosis Date  . HTN (hypertension)   . Hyperlipemia   . Diabetes mellitus     x 10 yrs  . PONV (postoperative nausea and vomiting)   . Atrial fibrillation     CLEARED BY DR Acie Fredrickson 05/27/11  . PAF (paroxysmal atrial fibrillation) 1/14-16/14    Past Surgical History  Procedure Laterality Date  . Cataract extraction  05/25/07    RIGHT EYE  . Appendectomy    . Carpal tunnel release    . Abdominal hysterectomy    . Lumbar laminectomy    . Foot surgery  2006    MID FOOT RECONSTRUCTION  . Osteotomy  2006    AND FUSION  . Ganglion cyst excision  12/2007    Dr.Gramig  . Total knee arthroplasty  05/02/08, 07/25/08    Dr.Alusio  . Joint replacement      bilateral  . 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 Colonoscopy  01/31/2009    small external/internal hemorrhoids, diverticulosis in sigmoid colon, one small polyp (biopsied). Biospy essentially normal. Dr.Magod  . Colonoscopy  04/30/2012    Procedure: COLONOSCOPY;  Surgeon: Cleotis Nipper, MD;  Location: [facility] ENDOSCOPY;  Service: Endoscopy;  Laterality: N/A;  unprepped, poss flex only    History   Social History  . Marital Status: Married    Spouse Name: JAMES    Number of Children: 2  . Years of Education: N/A   Occupational History  . 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 Smoking status: Never Smoker   . Smokeless tobacco: Never Used  . Alcohol Use: No  . Drug  Use: No  . Sexual Activity: Not Currently   Other Topics Concern  . Not on file   Social History Narrative  . No narrative on file    Current Outpatient Prescriptions on File Prior to Visit  Medication Sig Dispense Refill  . bromocriptine (PARLODEL) 2.5 MG tablet Take 1 tablet (2.5 mg total) by mouth daily.  90 tablet  3  . calcium carbonate (OS-CAL) 600 MG TABS Take 600 mg by mouth daily.       . Cholecalciferol (VITAMIN D PO) Take 800 Units by mouth daily.        . colesevelam (WELCHOL) 625 MG tablet Take 3 tablets (1,875 mg total) by mouth 2 (two) times daily with a meal.  180 tablet  11  . furosemide (LASIX) 20 MG tablet Take 1 tablet (20 mg total) by mouth daily as needed.  90 tablet  0  . glucose blood (BAYER CONTOUR NEXT TEST) test strip Use as instructed  100 each  12  . glucose blood test strip 1 each by Other route as needed for other. Use as instructed      . metoprolol (LOPRESSOR) 100 MG tablet Take 1 tablet (100 mg total)  by mouth 2 (two) times daily.  180 tablet  3  . Multiple Vitamin (MULITIVITAMIN WITH MINERALS) TABS Take 1 tablet by mouth daily.      . nateglinide (STARLIX) 120 MG tablet Take 1 tablet (120 mg total) by mouth 3 (three) times daily before meals.  270 tablet  2  . polyethylene glycol powder (GLYCOLAX/MIRALAX) powder Take 17 g by mouth as needed.      . ramipril (ALTACE) 10 MG capsule TAKE ONE CAPSULE BY MOUTH ONCE DAILY  90 capsule  0  . saxagliptin HCl (ONGLYZA) 5 MG TABS tablet Take 5 mg by mouth daily.      . simvastatin (ZOCOR) 20 MG tablet Labs overdue, 1 by mouth at bedtime  90 tablet  2  . warfarin (COUMADIN) 5 MG tablet Take as directed by coumadin clinic  90 tablet  0   No current facility-administered medications on file prior to visit.    Allergies  Allergen Reactions  . Morphine And Related Itching, Nausea And Vomiting and Rash  . Actos [Pioglitazone Hydrochloride] Swelling  . Januvia [Sitagliptin Phosphate] Other (See Comments)    Pt  says this caused elevated liver enzymes and creatinine  . Percocet [Oxycodone-Acetaminophen] Nausea And Vomiting  . Hydrocodone-Acetaminophen Itching and Nausea And Vomiting    Family History  Problem Relation Age of Onset  . Coronary artery disease Father   . Hypertension Father   . Stroke Father   . Diabetes Father   . Kidney failure Mother   . Hypertension Mother   . Other Brother     SEPSIS  . Diabetes Brother   . Diabetes Sister   . Kidney failure Sister     BP 120/84  Pulse 106  Temp(Src) 98.1 F (36.7 C) (Oral)  Ht 5\' 4"  (1.626 m)  Wt 192 lb (87.091 kg)  BMI 32.94 kg/m2  SpO2 98%   Review of Systems denies hypoglycemia and weight change.     Objective:   Physical Exam VITAL SIGNS:  See vs page. GENERAL: no distress.   Lab Results  Component Value Date   HGBA1C 7.6* 04/19/2013      Assessment & Plan:  DM: for now, she does not need insulin. Renal insuff: this prevents rx with metformin. Edema: this limits rx with actos.

## 2013-04-22 ENCOUNTER — Other Ambulatory Visit: Payer: Self-pay | Admitting: Endocrinology

## 2013-04-23 ENCOUNTER — Ambulatory Visit (INDEPENDENT_AMBULATORY_CARE_PROVIDER_SITE_OTHER): Payer: Medicare Other | Admitting: *Deleted

## 2013-04-23 ENCOUNTER — Other Ambulatory Visit: Payer: Self-pay

## 2013-04-23 DIAGNOSIS — Z7901 Long term (current) use of anticoagulants: Secondary | ICD-10-CM | POA: Diagnosis not present

## 2013-04-23 DIAGNOSIS — I4891 Unspecified atrial fibrillation: Secondary | ICD-10-CM | POA: Diagnosis not present

## 2013-04-23 DIAGNOSIS — Z5181 Encounter for therapeutic drug level monitoring: Secondary | ICD-10-CM

## 2013-04-23 LAB — POCT INR: INR: 2.3

## 2013-04-23 MED ORDER — SAXAGLIPTIN HCL 5 MG PO TABS: 5.0000 mg | ORAL_TABLET | Freq: Every day | ORAL | Status: DC

## 2013-04-26 ENCOUNTER — Other Ambulatory Visit: Payer: Self-pay | Admitting: *Deleted

## 2013-04-26 MED ORDER — BROMOCRIPTINE MESYLATE 2.5 MG PO TABS: 2.5000 mg | ORAL_TABLET | Freq: Every day | ORAL | Status: DC

## 2013-04-26 MED ORDER — COLESEVELAM HCL 625 MG PO TABS: 1875.0000 mg | ORAL_TABLET | Freq: Two times a day (BID) | ORAL | Status: DC

## 2013-04-28 ENCOUNTER — Other Ambulatory Visit: Payer: Self-pay

## 2013-04-28 DIAGNOSIS — I10 Essential (primary) hypertension: Secondary | ICD-10-CM

## 2013-04-28 DIAGNOSIS — E1139 Type 2 diabetes mellitus with other diabetic ophthalmic complication: Secondary | ICD-10-CM

## 2013-04-28 DIAGNOSIS — I4891 Unspecified atrial fibrillation: Secondary | ICD-10-CM

## 2013-04-28 DIAGNOSIS — E1165 Type 2 diabetes mellitus with hyperglycemia: Secondary | ICD-10-CM

## 2013-04-28 DIAGNOSIS — E782 Mixed hyperlipidemia: Secondary | ICD-10-CM

## 2013-04-28 MED ORDER — METOPROLOL TARTRATE 100 MG PO TABS: 100.0000 mg | ORAL_TABLET | Freq: Two times a day (BID) | ORAL | Status: DC

## 2013-04-28 MED ORDER — FUROSEMIDE 20 MG PO TABS: 20.0000 mg | ORAL_TABLET | Freq: Every day | ORAL | Status: DC | PRN

## 2013-05-06 ENCOUNTER — Encounter: Payer: Self-pay | Admitting: Internal Medicine

## 2013-05-14 ENCOUNTER — Ambulatory Visit (INDEPENDENT_AMBULATORY_CARE_PROVIDER_SITE_OTHER): Payer: Medicare Other | Admitting: *Deleted

## 2013-05-14 DIAGNOSIS — I4891 Unspecified atrial fibrillation: Secondary | ICD-10-CM

## 2013-05-14 DIAGNOSIS — Z7901 Long term (current) use of anticoagulants: Secondary | ICD-10-CM | POA: Diagnosis not present

## 2013-05-14 DIAGNOSIS — Z5181 Encounter for therapeutic drug level monitoring: Secondary | ICD-10-CM

## 2013-05-14 LAB — POCT INR: INR: 2.6

## 2013-05-28 ENCOUNTER — Ambulatory Visit (INDEPENDENT_AMBULATORY_CARE_PROVIDER_SITE_OTHER): Payer: Medicare Other | Admitting: *Deleted

## 2013-05-28 DIAGNOSIS — Z7901 Long term (current) use of anticoagulants: Secondary | ICD-10-CM

## 2013-05-28 DIAGNOSIS — I4891 Unspecified atrial fibrillation: Secondary | ICD-10-CM | POA: Diagnosis not present

## 2013-05-28 DIAGNOSIS — Z5181 Encounter for therapeutic drug level monitoring: Secondary | ICD-10-CM

## 2013-05-28 LAB — POCT INR: INR: 1.9

## 2013-06-14 ENCOUNTER — Ambulatory Visit (INDEPENDENT_AMBULATORY_CARE_PROVIDER_SITE_OTHER): Payer: Medicare Other | Admitting: *Deleted

## 2013-06-14 DIAGNOSIS — I4891 Unspecified atrial fibrillation: Secondary | ICD-10-CM | POA: Diagnosis not present

## 2013-06-14 DIAGNOSIS — Z7901 Long term (current) use of anticoagulants: Secondary | ICD-10-CM

## 2013-06-14 DIAGNOSIS — Z5181 Encounter for therapeutic drug level monitoring: Secondary | ICD-10-CM | POA: Diagnosis not present

## 2013-06-14 LAB — POCT INR: INR: 1.6

## 2013-06-28 ENCOUNTER — Ambulatory Visit (INDEPENDENT_AMBULATORY_CARE_PROVIDER_SITE_OTHER): Payer: Medicare Other | Admitting: *Deleted

## 2013-06-28 DIAGNOSIS — I4891 Unspecified atrial fibrillation: Secondary | ICD-10-CM

## 2013-06-28 DIAGNOSIS — Z7901 Long term (current) use of anticoagulants: Secondary | ICD-10-CM | POA: Diagnosis not present

## 2013-06-28 DIAGNOSIS — Z5181 Encounter for therapeutic drug level monitoring: Secondary | ICD-10-CM | POA: Diagnosis not present

## 2013-06-28 LAB — POCT INR: INR: 1.9

## 2013-07-01 ENCOUNTER — Other Ambulatory Visit: Payer: Self-pay | Admitting: *Deleted

## 2013-07-02 ENCOUNTER — Other Ambulatory Visit: Payer: Self-pay

## 2013-07-02 MED ORDER — RAMIPRIL 10 MG PO CAPS: ORAL_CAPSULE | ORAL | Status: DC

## 2013-07-12 ENCOUNTER — Ambulatory Visit (INDEPENDENT_AMBULATORY_CARE_PROVIDER_SITE_OTHER): Payer: Medicare Other | Admitting: Pharmacist

## 2013-07-12 DIAGNOSIS — Z7901 Long term (current) use of anticoagulants: Secondary | ICD-10-CM

## 2013-07-12 DIAGNOSIS — I4891 Unspecified atrial fibrillation: Secondary | ICD-10-CM | POA: Diagnosis not present

## 2013-07-12 DIAGNOSIS — Z5181 Encounter for therapeutic drug level monitoring: Secondary | ICD-10-CM

## 2013-07-12 LAB — POCT INR: INR: 1.6

## 2013-07-22 DIAGNOSIS — E11339 Type 2 diabetes mellitus with moderate nonproliferative diabetic retinopathy without macular edema: Secondary | ICD-10-CM | POA: Diagnosis not present

## 2013-07-22 DIAGNOSIS — E1139 Type 2 diabetes mellitus with other diabetic ophthalmic complication: Secondary | ICD-10-CM | POA: Diagnosis not present

## 2013-07-22 DIAGNOSIS — E11311 Type 2 diabetes mellitus with unspecified diabetic retinopathy with macular edema: Secondary | ICD-10-CM | POA: Diagnosis not present

## 2013-07-26 ENCOUNTER — Other Ambulatory Visit (HOSPITAL_COMMUNITY): Payer: Medicare Other

## 2013-07-26 ENCOUNTER — Ambulatory Visit (INDEPENDENT_AMBULATORY_CARE_PROVIDER_SITE_OTHER): Payer: Medicare Other

## 2013-07-26 ENCOUNTER — Ambulatory Visit (HOSPITAL_COMMUNITY): Payer: Medicare Other | Attending: Cardiovascular Disease | Admitting: Radiology

## 2013-07-26 DIAGNOSIS — Z7901 Long term (current) use of anticoagulants: Secondary | ICD-10-CM

## 2013-07-26 DIAGNOSIS — I4891 Unspecified atrial fibrillation: Secondary | ICD-10-CM

## 2013-07-26 DIAGNOSIS — E782 Mixed hyperlipidemia: Secondary | ICD-10-CM

## 2013-07-26 DIAGNOSIS — I4892 Unspecified atrial flutter: Secondary | ICD-10-CM | POA: Insufficient documentation

## 2013-07-26 DIAGNOSIS — I10 Essential (primary) hypertension: Secondary | ICD-10-CM

## 2013-07-26 DIAGNOSIS — Z5181 Encounter for therapeutic drug level monitoring: Secondary | ICD-10-CM

## 2013-07-26 DIAGNOSIS — E1165 Type 2 diabetes mellitus with hyperglycemia: Secondary | ICD-10-CM

## 2013-07-26 DIAGNOSIS — E1139 Type 2 diabetes mellitus with other diabetic ophthalmic complication: Secondary | ICD-10-CM

## 2013-07-26 LAB — POCT INR: INR: 2.8

## 2013-07-26 NOTE — Progress Notes (Signed)
Echocardiogram performed.  

## 2013-07-28 DIAGNOSIS — E1139 Type 2 diabetes mellitus with other diabetic ophthalmic complication: Secondary | ICD-10-CM | POA: Diagnosis not present

## 2013-07-28 DIAGNOSIS — E11311 Type 2 diabetes mellitus with unspecified diabetic retinopathy with macular edema: Secondary | ICD-10-CM | POA: Diagnosis not present

## 2013-07-28 DIAGNOSIS — E11339 Type 2 diabetes mellitus with moderate nonproliferative diabetic retinopathy without macular edema: Secondary | ICD-10-CM | POA: Diagnosis not present

## 2013-08-03 ENCOUNTER — Encounter: Payer: Self-pay | Admitting: Cardiovascular Disease

## 2013-08-03 ENCOUNTER — Ambulatory Visit (INDEPENDENT_AMBULATORY_CARE_PROVIDER_SITE_OTHER): Payer: Medicare Other | Admitting: Cardiovascular Disease

## 2013-08-03 VITALS — BP 136/80 | HR 76 | Ht 64.0 in | Wt 194.0 lb

## 2013-08-03 DIAGNOSIS — I4891 Unspecified atrial fibrillation: Secondary | ICD-10-CM | POA: Diagnosis not present

## 2013-08-03 MED ORDER — RAMIPRIL 10 MG PO CAPS: 10.0000 mg | ORAL_CAPSULE | Freq: Two times a day (BID) | ORAL | Status: DC

## 2013-08-03 NOTE — Assessment & Plan Note (Signed)
Becky Forbes has persistent atrial fibrillation. She used to have paroxysmal atrial fibrillation. She seems to be relatively asymptomatic. I've offered to send her to the electrophysiologist to discuss various antiarrhythmic regimens. I suspect that he may benefit from amiodarone.  We'll continue with her anticoagulation.

## 2013-08-03 NOTE — Progress Notes (Signed)
Becky Forbes Date of Birth  11/30/1940 [place]  [address]    [place]   [name], [name]  [address]    [place],   [id number] [phone]  Fax  [phone]  [phone]  Fax [phone]  Problem list: 1. Atrial fibrillation / atrial Flutter - paroxysmal 2. Diabetes mellitus 3. Hypertension 4. Hyperlipidemia 5. Chronic systolic CHF - EF 63-89%  (assumed to be nonischemic)   History of Present Illness:  [name] not had any episodes of chest pain or shortness of breath.   She had back surgery earlier this year. [name] not had any chest pain or shortness breath. Her blood pressure has been well controlled.  [name] not had any episodes of atrial fibrillation.  Feb. 4, 2014: She was admitted to the hospital in mid January with a GI bleed and was noted to be in atrial fibrillation.  She had an echocardiogram which revealed an ejection fraction of 35-40%. She started on Lasix 20 twice a day and metoprolol. She had some blood work drawn last week which revealed a mild increase in her creatinine up to 1.9. She was also found to have a potassium of 6.1. Recheck potassium level was 4.6.  [name] noted that her blood pressures been quite low since she left the hospital. She also was having symptoms of orthostatic hypotension.  July 23, 2012:  Becky Forbes is doing well.  No further episodes of Afib and no blood in stool.  She was admitted to the hospital in January with GI bleeding was found to have atrial fibrillation. Workup at that time revealed what appeared to be ischemic colitis. Her aspirin was held for a week. She improved and has not had any further bleeding. We have been able to restart Coumadin she's not had any bleeding. The bruising that she was having is now better since she has been off the aspirin.  She has a nonischemic cardiomyopathy. Echocardiogram in January revealed an ejection fraction of 35-40%. She has  moderate pulmonary  hypertension with an estimated PA pressure of 49. I have  reviewed the echo and the results are noted below. Left ventricle: The cavity size was normal. Wall thickness was increased in a pattern of mild LVH. Systolic function was moderately reduced. The estimated ejection fraction was in the range of 35% to 40%. Diffuse hypokinesis. - Mitral valve: Mild regurgitation. - Left atrium: The atrium was mildly dilated. - Right ventricle: The cavity size was mildly dilated. Systolic function was mildly reduced. - Right atrium: The atrium was mildly dilated. - Pulmonary arteries: Systolic pressure was moderately increased. PA peak pressure: 75mm Hg   She continues to have generalized fatigue and malaise.  She has chronic renal insufficiency.  Oct. 23, 2014:  Becky Forbes is in atrial flutter today.  No symptoms.  breathing is ok.  She walks on occasion.  [name] not able to tell if her heart rate is irregular or not.  August 03, 2013:   Becky Forbes is doing well.  No Cp or dyspnea.  Perhaps mild DOE with walking  Echo April 2015 showed improved LV function.  Left ventricle: The cavity size was normal. Systolic function was mildly to moderately reduced. The estimated ejection fraction was in the range of 40% to 45%. Severe hypokinesis of the basal-midinferolateral and inferior myocardium. The study is not technically sufficient to allow evaluation of LV diastolic function. - Mitral valve: Calcified annulus. Mild to moderate regurgitation directed  centrally. - Left atrium: The atrium was severely dilated. - Right atrium: The atrium was moderately dilated    Current Outpatient Prescriptions on File Prior to Visit  Medication Sig Dispense Refill  . bromocriptine (PARLODEL) 2.5 MG tablet Take 1 tablet (2.5 mg total) by mouth daily.  180 tablet  1  . calcium carbonate (OS-CAL) 600 MG TABS Take 600 mg by mouth daily.       . Cholecalciferol (VITAMIN D PO) Take 800 Units by mouth daily.        .  colesevelam (WELCHOL) 625 MG tablet Take 3 tablets (1,875 mg total) by mouth 2 (two) times daily with a meal.  540 tablet  1  . furosemide (LASIX) 20 MG tablet Take 1 tablet (20 mg total) by mouth daily as needed.  90 tablet  2  . glucose blood (BAYER CONTOUR NEXT TEST) test strip Use as instructed  100 each  12  . glucose blood test strip 1 each by Other route as needed for other. Use as instructed      . metoprolol (LOPRESSOR) 100 MG tablet Take 1 tablet (100 mg total) by mouth 2 (two) times daily.  180 tablet  3  . Multiple Vitamin (MULITIVITAMIN WITH MINERALS) TABS Take 1 tablet by mouth daily.      . nateglinide (STARLIX) 120 MG tablet Take 1 tablet by mouth 3  times a day before meals  270 tablet  3  . polyethylene glycol powder (GLYCOLAX/MIRALAX) powder Take 17 g by mouth as needed.      . ramipril (ALTACE) 10 MG capsule TAKE ONE CAPSULE BY MOUTH ONCE DAILY  30 capsule  0  . saxagliptin HCl (ONGLYZA) 5 MG TABS tablet Take 1 tablet (5 mg total) by mouth daily.  90 tablet  0  . simvastatin (ZOCOR) 20 MG tablet Labs overdue, 1 by mouth at bedtime  90 tablet  2  . warfarin (COUMADIN) 5 MG tablet Take as directed by coumadin clinic  90 tablet  0   No current facility-administered medications on file prior to visit.    Allergies  Allergen Reactions  . Morphine And Related Itching, Nausea And Vomiting and Rash  . Actos [Pioglitazone Hydrochloride] Swelling  . Januvia [Sitagliptin Phosphate] Other (See Comments)    Pt says this caused elevated liver enzymes and creatinine  . Percocet [Oxycodone-Acetaminophen] Nausea And Vomiting  . Hydrocodone-Acetaminophen Itching and Nausea And Vomiting    Past Medical History  Diagnosis Date  . HTN (hypertension)   . Hyperlipemia   . Diabetes mellitus     x 10 yrs  . PONV (postoperative nausea and vomiting)   . Atrial fibrillation     CLEARED BY DR Acie Fredrickson 05/27/11  . PAF (paroxysmal atrial fibrillation) 1/14-16/14    Past Surgical History   Procedure Laterality Date  . Cataract extraction  05/25/07    RIGHT EYE  . Appendectomy    . Carpal tunnel release    . Abdominal hysterectomy    . Lumbar laminectomy    . Foot surgery  2006    MID FOOT RECONSTRUCTION  . Osteotomy  2006    AND FUSION  . Ganglion cyst excision  12/2007    Dr.Gramig  . Total knee arthroplasty  05/02/08, 07/25/08    Dr.Alusio  . Joint replacement      bilateral  . 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 Colonoscopy  01/31/2009    small external/internal hemorrhoids, diverticulosis in sigmoid colon,  one small polyp (biopsied). Biospy essentially normal. Dr.Magod  . Colonoscopy  04/30/2012    Procedure: COLONOSCOPY;  Surgeon: Cleotis Nipper, MD;  Location: [facility] ENDOSCOPY;  Service: Endoscopy;  Laterality: N/A;  unprepped, poss flex only    History  Smoking status  . Never Smoker   Smokeless tobacco  . Never Used    History  Alcohol Use No    Family History  Problem Relation Age of Onset  . Coronary artery disease Father   . Hypertension Father   . Stroke Father   . Diabetes Father   . Kidney failure Mother   . Hypertension Mother   . Other Brother     SEPSIS  . Diabetes Brother   . Diabetes Sister   . Kidney failure Sister     Reviw of Systems:  Reviewed in the HPI.  All other systems are negative.  Physical Exam: Blood pressure 136/80, pulse 76, height 5\' 4"  (1.626 m), weight 194 lb (87.998 kg). General: Well developed, well nourished, in no acute distress.  Head: Normocephalic, atraumatic, sclera non-icteric, mucus membranes are moist,   Neck: Supple. Negative for carotid bruits. JVD not elevated.  Lungs: Clear bilaterally to auscultation without wheezes, rales, or rhonchi. Breathing is unlabored.  Heart: RRR with S1 S2. No murmurs, rubs, or gallops appreciated.  Abdomen: Soft, non-tender, non-distended with normoactive bowel sounds. No hepatomegaly. No rebound/guarding. No obvious abdominal masses.  Msk:   Strength and tone appear normal for age.  Extremities: No clubbing or cyanosis. No edema.  Distal pedal pulses are 2+ and equal bilaterally.  Neuro: Alert and oriented X 3. Moves all extremities spontaneously.  Psych:  Responds to questions appropriately with a normal affect.  ECG: Oct. 23, 2014:  Atrial flutter with variable AV block  Assessment / Plan:

## 2013-08-03 NOTE — Assessment & Plan Note (Signed)
Her left ventricular systolic function has improved slightly since her echo last year. She is on metoprolol and Altace. We will increase her Altace to 10 mg twice a day. Continue with metoprolol. We'll consider adding Aldactone at her next visit if needed.

## 2013-08-03 NOTE — Patient Instructions (Signed)
Your physician has recommended you make the following change in your medication:  INCREASE Altace to 10 mg twice daily  Your physician recommends that you return for lab work in: 1 month for BMET - you do not have to fast for this appointment  Your physician wants you to follow-up in: 3 months with Dr. Vilinda Boehringer will receive a reminder letter in the mail two months in advance. If you don't receive a letter, please call our office to schedule the follow-up appointment. Your physician recommends that you return for lab work in: 3 months when you see Dr. Acie Fredrickson for repeat BMET

## 2013-08-03 NOTE — Assessment & Plan Note (Signed)
She has moderate renal dysfunction. We will try to increase her Altace to 10 twice a day.  We will check her basic metabolic profile in one month.

## 2013-08-04 ENCOUNTER — Other Ambulatory Visit: Payer: Self-pay | Admitting: Cardiovascular Disease

## 2013-08-17 ENCOUNTER — Ambulatory Visit (INDEPENDENT_AMBULATORY_CARE_PROVIDER_SITE_OTHER): Payer: Medicare Other | Admitting: *Deleted

## 2013-08-17 DIAGNOSIS — I4891 Unspecified atrial fibrillation: Secondary | ICD-10-CM | POA: Diagnosis not present

## 2013-08-17 DIAGNOSIS — Z7901 Long term (current) use of anticoagulants: Secondary | ICD-10-CM

## 2013-08-17 DIAGNOSIS — Z5181 Encounter for therapeutic drug level monitoring: Secondary | ICD-10-CM

## 2013-08-17 LAB — POCT INR: INR: 3.2

## 2013-08-27 ENCOUNTER — Telehealth: Payer: Self-pay | Admitting: Cardiovascular Disease

## 2013-08-27 MED ORDER — AMIODARONE HCL 200 MG PO TABS
200.0000 mg | ORAL_TABLET | Freq: Two times a day (BID) | ORAL | Status: DC
Start: 2013-08-27 — End: 2013-09-22

## 2013-08-27 MED ORDER — METOPROLOL TARTRATE 50 MG PO TABS: 50.0000 mg | ORAL_TABLET | Freq: Two times a day (BID) | ORAL | Status: DC

## 2013-08-27 NOTE — Telephone Encounter (Signed)
Spoke with patient and advised her that per Dr. Acie Fredrickson he suspects fatigue is due to atrial flutter/afib and that he advises she start Amiodarone 200 mg BID and decrease Metoprolol to 50 mg BID.  Patient verbalized understanding and agreement and I rescheduled her f/u to June 10.  I spoke with Alferd Apa, Pharm-D in [facility] due to patient is on Warfarin and he advised patient reduce Monday dose to 1/2 tab instead of whole tablet and to keep appointment with [facility] for Thursday.  Patient verbalized understanding and agreement.

## 2013-08-27 NOTE — Telephone Encounter (Signed)
Attempted to call patient; no answer, unable to leave message

## 2013-08-27 NOTE — Telephone Encounter (Signed)
Patient is having episodes of low energy and fatigue. I suspect this is because of her atrial fibrillation/atrial flutter. She thinks that it may be because of the high dose of metoprolol. At this point will go ahead and load her on amiodarone. We'll start with 200 mg twice a day. We will decrease her metoprolol to 50 mg twice a. 2 back in the office in approximately 3-4 weeks for an office visit and EKG. Anticipate doing a cardioversion at some point after that office visit

## 2013-08-27 NOTE — Telephone Encounter (Signed)
Spoke with patient who states she has been feeling fatigued for a while.  Patient states when she goes up a flight of stairs she has to stop at the top because she has no energy to keep walking.  Patient states she has been feeling this way for awhile but she thought she was supposed to feel this way.  Blood pressure yesterday was 124/78; states pulse today is 66 and regular.  Patient states she believes her heart is in regular rhythm - states she does not think she is in afib.  Patient states she did not report this at last office visit in April because she thought it was normal for her to feel this way.  Patient states she would like to have more energy and would like to decrease Metoprolol to 50 mg BID as she feels this may help with her energy level.  I verified that patient is taking the medication 12 hours apart.  I advised patient that I will talk to Dr. Acie Fredrickson about medication dosage and will call her back later today.  Patient verbalized understanding and agreement.

## 2013-08-27 NOTE — Telephone Encounter (Signed)
New message     Pt is on metoprolol 100mg  bid.  She said this dosage is too much---it is "zonking" her out.  Can she have the dosage changed?

## 2013-09-01 DIAGNOSIS — E1139 Type 2 diabetes mellitus with other diabetic ophthalmic complication: Secondary | ICD-10-CM | POA: Diagnosis not present

## 2013-09-01 DIAGNOSIS — E11311 Type 2 diabetes mellitus with unspecified diabetic retinopathy with macular edema: Secondary | ICD-10-CM | POA: Diagnosis not present

## 2013-09-02 ENCOUNTER — Other Ambulatory Visit (INDEPENDENT_AMBULATORY_CARE_PROVIDER_SITE_OTHER): Payer: Medicare Other

## 2013-09-02 ENCOUNTER — Ambulatory Visit (INDEPENDENT_AMBULATORY_CARE_PROVIDER_SITE_OTHER): Payer: Medicare Other | Admitting: Pharmacist Clinician (PhC)/ Clinical Pharmacy Specialist

## 2013-09-02 DIAGNOSIS — I4891 Unspecified atrial fibrillation: Secondary | ICD-10-CM | POA: Diagnosis not present

## 2013-09-02 DIAGNOSIS — Z5181 Encounter for therapeutic drug level monitoring: Secondary | ICD-10-CM

## 2013-09-02 DIAGNOSIS — Z7901 Long term (current) use of anticoagulants: Secondary | ICD-10-CM | POA: Diagnosis not present

## 2013-09-02 LAB — BASIC METABOLIC PANEL
BUN: 22 mg/dL (ref 6–23)
CO2: 27 meq/L (ref 19–32)
CREATININE: 1.4 mg/dL — AB (ref 0.4–1.2)
Calcium: 9.1 mg/dL (ref 8.4–10.5)
Chloride: 103 mEq/L (ref 96–112)
GFR: 39.76 mL/min — ABNORMAL LOW (ref >60.00)
Glucose, Bld: 107 mg/dL — ABNORMAL HIGH (ref 70–99)
POTASSIUM: 5 meq/L (ref 3.5–5.1)
Sodium: 136 mEq/L (ref 135–145)

## 2013-09-02 LAB — POCT INR: INR: 2.4

## 2013-09-16 ENCOUNTER — Ambulatory Visit (INDEPENDENT_AMBULATORY_CARE_PROVIDER_SITE_OTHER): Payer: Medicare Other

## 2013-09-16 DIAGNOSIS — I4891 Unspecified atrial fibrillation: Secondary | ICD-10-CM

## 2013-09-16 DIAGNOSIS — Z5181 Encounter for therapeutic drug level monitoring: Secondary | ICD-10-CM | POA: Diagnosis not present

## 2013-09-16 DIAGNOSIS — Z7901 Long term (current) use of anticoagulants: Secondary | ICD-10-CM

## 2013-09-16 LAB — POCT INR: INR: 4.8

## 2013-09-16 MED ORDER — WARFARIN SODIUM 2.5 MG PO TABS: ORAL_TABLET | ORAL | Status: DC

## 2013-09-22 ENCOUNTER — Ambulatory Visit (INDEPENDENT_AMBULATORY_CARE_PROVIDER_SITE_OTHER): Payer: Medicare Other | Admitting: Cardiovascular Disease

## 2013-09-22 ENCOUNTER — Encounter: Payer: Self-pay | Admitting: Cardiovascular Disease

## 2013-09-22 ENCOUNTER — Ambulatory Visit (INDEPENDENT_AMBULATORY_CARE_PROVIDER_SITE_OTHER): Payer: Medicare Other | Admitting: *Deleted

## 2013-09-22 VITALS — BP 144/96 | HR 80 | Ht 64.0 in | Wt 191.0 lb

## 2013-09-22 DIAGNOSIS — I5022 Chronic systolic (congestive) heart failure: Secondary | ICD-10-CM

## 2013-09-22 DIAGNOSIS — Z5181 Encounter for therapeutic drug level monitoring: Secondary | ICD-10-CM

## 2013-09-22 DIAGNOSIS — I509 Heart failure, unspecified: Secondary | ICD-10-CM | POA: Diagnosis not present

## 2013-09-22 DIAGNOSIS — I4891 Unspecified atrial fibrillation: Secondary | ICD-10-CM

## 2013-09-22 DIAGNOSIS — Z7901 Long term (current) use of anticoagulants: Secondary | ICD-10-CM | POA: Diagnosis not present

## 2013-09-22 LAB — POCT INR: INR: 2.7

## 2013-09-22 MED ORDER — AMIODARONE HCL 200 MG PO TABS: 200.0000 mg | ORAL_TABLET | Freq: Every day | ORAL | Status: DC

## 2013-09-22 NOTE — Progress Notes (Signed)
Becky Forbes Date of Birth  17-Aug-1940 [place]  [address]    [place]   [place], [place]  [address]    [place],   [address] [phone]  Fax  [phone]  [phone]  Fax [phone]  Problem list: 1. Atrial fibrillation / atrial Flutter - paroxysmal 2. Diabetes mellitus 3. Hypertension 4. Hyperlipidemia 5. Chronic systolic CHF - EF 91-91%  (assumed to be nonischemic)   History of Present Illness:  [name] not had any episodes of chest pain or shortness of breath.   She had back surgery earlier this year. [name] not had any chest pain or shortness breath. Her blood pressure has been well controlled.  [name] not had any episodes of atrial fibrillation.  Feb. 4, 2014: She was admitted to the hospital in mid January with a GI bleed and was noted to be in atrial fibrillation.  She had an echocardiogram which revealed an ejection fraction of 35-40%. She started on Lasix 20 twice a day and metoprolol. She had some blood work drawn last week which revealed a mild increase in her creatinine up to 1.9. She was also found to have a potassium of 6.1. Recheck potassium level was 4.6.  [name] noted that her blood pressures been quite low since she left the hospital. She also was having symptoms of orthostatic hypotension.  July 23, 2012:  Becky Forbes is doing well.  No further episodes of Afib and no blood in stool.  She was admitted to the hospital in January with GI bleeding was found to have atrial fibrillation. Workup at that time revealed what appeared to be ischemic colitis. Her aspirin was held for a week. She improved and has not had any further bleeding. We have been able to restart Coumadin she's not had any bleeding. The bruising that she was having is now better since she has been off the aspirin.  She has a nonischemic cardiomyopathy. Echocardiogram in January revealed an ejection fraction of 35-40%. She has  moderate pulmonary  hypertension with an estimated PA pressure of 49. I have  reviewed the echo and the results are noted below. Left ventricle: The cavity size was normal. Wall thickness was increased in a pattern of mild LVH. Systolic function was moderately reduced. The estimated ejection fraction was in the range of 35% to 40%. Diffuse hypokinesis. - Mitral valve: Mild regurgitation. - Left atrium: The atrium was mildly dilated. - Right ventricle: The cavity size was mildly dilated. Systolic function was mildly reduced. - Right atrium: The atrium was mildly dilated. - Pulmonary arteries: Systolic pressure was moderately increased. PA peak pressure: 31mm Hg   She continues to have generalized fatigue and malaise.  She has chronic renal insufficiency.  Oct. 23, 2014:  Becky Forbes is in atrial flutter today.  No symptoms.  breathing is ok.  She walks on occasion.  [name] not able to tell if her heart rate is irregular or not.  August 03, 2013:   Becky Forbes is doing well.  No Cp or dyspnea.  Perhaps mild DOE with walking  Echo April 2015 showed improved LV function.  Left ventricle: The cavity size was normal. Systolic function was mildly to moderately reduced. The estimated ejection fraction was in the range of 40% to 45%. Severe hypokinesis of the basal-midinferolateral and inferior myocardium. The study is not technically sufficient to allow evaluation of LV diastolic function. - Mitral valve: Calcified annulus. Mild to moderate regurgitation directed  centrally. - Left atrium: The atrium was severely dilated. - Right atrium: The atrium was moderately dilated  September 22, 2013: Becky Forbes is feeling a little bit better. She seems to be tolerating the amiodarone better than the high dose of metoprolol. [name] not having episodes of chest pain. She thinks her heart rate is better.  Her blood pressure here in the office is a little bit elevated. Her blood pressures at home have been all in the normal range.    Current  Outpatient Prescriptions on File Prior to Visit  Medication Sig Dispense Refill  . amiodarone (PACERONE) 200 MG tablet Take 1 tablet (200 mg total) by mouth 2 (two) times daily.  180 tablet  3  . bromocriptine (PARLODEL) 2.5 MG tablet Take 1 tablet (2.5 mg total) by mouth daily.  180 tablet  1  . calcium carbonate (OS-CAL) 600 MG TABS Take 600 mg by mouth daily.       . Cholecalciferol (VITAMIN D PO) Take 800 Units by mouth daily.        . colesevelam (WELCHOL) 625 MG tablet Take 3 tablets (1,875 mg total) by mouth 2 (two) times daily with a meal.  540 tablet  1  . furosemide (LASIX) 20 MG tablet Take 1 tablet (20 mg total) by mouth daily as needed.  90 tablet  2  . glucose blood (BAYER CONTOUR NEXT TEST) test strip Use as instructed  100 each  12  . glucose blood test strip 1 each by Other route as needed for other. Use as instructed      . metoprolol (LOPRESSOR) 50 MG tablet Take 1 tablet (50 mg total) by mouth 2 (two) times daily.  180 tablet  3  . Multiple Vitamin (MULITIVITAMIN WITH MINERALS) TABS Take 1 tablet by mouth daily.      . nateglinide (STARLIX) 120 MG tablet Take 1 tablet by mouth 3  times a day before meals  270 tablet  3  . polyethylene glycol powder (GLYCOLAX/MIRALAX) powder Take 17 g by mouth as needed.      . ramipril (ALTACE) 10 MG capsule Take 1 capsule (10 mg total) by mouth 2 (two) times daily.  180 capsule  3  . saxagliptin HCl (ONGLYZA) 5 MG TABS tablet Take 1 tablet (5 mg total) by mouth daily.  90 tablet  0  . simvastatin (ZOCOR) 20 MG tablet Labs overdue, 1 by mouth at bedtime  90 tablet  2  . warfarin (COUMADIN) 2.5 MG tablet Take as directed by Coumadin clinic  90 tablet  1   No current facility-administered medications on file prior to visit.    Allergies  Allergen Reactions  . Morphine And Related Itching, Nausea And Vomiting and Rash  . Actos [Pioglitazone Hydrochloride] Swelling  . Januvia [Sitagliptin Phosphate] Other (See Comments)    Pt says this  caused elevated liver enzymes and creatinine  . Percocet [Oxycodone-Acetaminophen] Nausea And Vomiting  . Hydrocodone-Acetaminophen Itching and Nausea And Vomiting    Past Medical History  Diagnosis Date  . HTN (hypertension)   . Hyperlipemia   . Diabetes mellitus     x 10 yrs  . PONV (postoperative nausea and vomiting)   . Atrial fibrillation     CLEARED BY DR Acie Fredrickson 05/27/11  . PAF (paroxysmal atrial fibrillation) 1/14-16/14    Past Surgical History  Procedure Laterality Date  . Cataract extraction  05/25/07    RIGHT EYE  . Appendectomy    . Carpal tunnel release    .  Abdominal hysterectomy    . Lumbar laminectomy    . Foot surgery  2006    MID FOOT RECONSTRUCTION  . Osteotomy  2006    AND FUSION  . Ganglion cyst excision  12/2007    Dr.Gramig  . Total knee arthroplasty  05/02/08, 07/25/08    Dr.Alusio  . Joint replacement      bilateral  . 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 Colonoscopy  01/31/2009    small external/internal hemorrhoids, diverticulosis in sigmoid colon, one small polyp (biopsied). Biospy essentially normal. Dr.Magod  . Colonoscopy  04/30/2012    Procedure: COLONOSCOPY;  Surgeon: Cleotis Nipper, MD;  Location: [facility] ENDOSCOPY;  Service: Endoscopy;  Laterality: N/A;  unprepped, poss flex only    History  Smoking status  . Never Smoker   Smokeless tobacco  . Never Used    History  Alcohol Use No    Family History  Problem Relation Age of Onset  . Coronary artery disease Father   . Hypertension Father   . Stroke Father   . Diabetes Father   . Kidney failure Mother   . Hypertension Mother   . Other Brother     SEPSIS  . Diabetes Brother   . Diabetes Sister   . Kidney failure Sister     Reviw of Systems:  Reviewed in the HPI.  All other systems are negative.  Physical Exam: Blood pressure 144/96, pulse 80, height 5\' 4"  (1.626 m), weight 191 lb (86.637 kg). General: Well developed, well nourished, in no acute  distress.  Head: Normocephalic, atraumatic, sclera non-icteric, mucus membranes are moist,   Neck: Supple. Negative for carotid bruits. JVD not elevated.  Lungs: Clear bilaterally to auscultation without wheezes, rales, or rhonchi. Breathing is unlabored.  Heart: irreg. Irreg.  with S1 S2. No murmurs, rubs, or gallops appreciated.  Abdomen: Soft, non-tender, non-distended with normoactive bowel sounds. No hepatomegaly. No rebound/guarding. No obvious abdominal masses.  Msk:  Strength and tone appear normal for age.  Extremities: No clubbing or cyanosis. No edema.  Distal pedal pulses are 2+ and equal bilaterally.  Neuro: Alert and oriented X 3. Moves all extremities spontaneously.  Psych:  Responds to questions appropriately with a normal affect.  ECG: Oct. 23, 2014:  Atrial flutter with variable AV block  Assessment / Plan:     Becky Forbes Date of Birth  03-24-1941 [place]  [address]    [place]   [place], [place]  [address]    [place], [place]  [address] [phone]  Fax  [phone]  [phone]  Fax [phone]  Problem list: 1. Atrial fibrillation / atrial Flutter - paroxysmal 2. Diabetes mellitus 3. Hypertension 4. Hyperlipidemia 5. Chronic systolic CHF - EF 79-89%  (assumed to be nonischemic)   History of Present Illness:  [name] not had any episodes of chest pain or shortness of breath.   She had back surgery earlier this year. [name] not had any chest pain or shortness breath. Her blood pressure has been well controlled.  [name] not had any episodes of atrial fibrillation.  Feb. 4, 2014: She was admitted to the hospital in mid January with a GI bleed and was noted to be in atrial fibrillation.  She had an echocardiogram which revealed an ejection fraction of 35-40%. She started on Lasix 20 twice a day and metoprolol. She had some blood work drawn last week which revealed  a mild increase in her creatinine  up to 1.9. She was also found to have a potassium of 6.1. Recheck potassium level was 4.6.  [name] noted that her blood pressures been quite low since she left the hospital. She also was having symptoms of orthostatic hypotension.  July 23, 2012:  [place] is doing well.  No further episodes of Afib and no blood in stool.  She was admitted to the hospital in January with GI bleeding was found to have atrial fibrillation. Workup at that time revealed what appeared to be ischemic colitis. Her aspirin was held for a week. She improved and has not had any further bleeding. We have been able to restart Coumadin she's not had any bleeding. The bruising that she was having is now better since she has been off the aspirin.  She has a nonischemic cardiomyopathy. Echocardiogram in January revealed an ejection fraction of 35-40%. She has  moderate pulmonary hypertension with an estimated PA pressure of 49. I have  reviewed the echo and the results are noted below. Left ventricle: The cavity size was normal. Wall thickness was increased in a pattern of mild LVH. Systolic function was moderately reduced. The estimated ejection fraction was in the range of 35% to 40%. Diffuse hypokinesis. - Mitral valve: Mild regurgitation. - Left atrium: The atrium was mildly dilated. - Right ventricle: The cavity size was mildly dilated. Systolic function was mildly reduced. - Right atrium: The atrium was mildly dilated. - Pulmonary arteries: Systolic pressure was moderately increased. PA peak pressure: 68mm Hg   She continues to have generalized fatigue and malaise.  She has chronic renal insufficiency.  Oct. 23, 2014:  Becky Forbes is in atrial flutter today.  No symptoms.  breathing is ok.  She walks on occasion.  [name] not able to tell if her heart rate is irregular or not.  August 03, 2013:   Becky Forbes is doing well.  No Cp or dyspnea.  Perhaps mild DOE with walking  Echo April 2015 showed improved LV function.  Left  ventricle: The cavity size was normal. Systolic function was mildly to moderately reduced. The estimated ejection fraction was in the range of 40% to 45%. Severe hypokinesis of the basal-midinferolateral and inferior myocardium. The study is not technically sufficient to allow evaluation of LV diastolic function. - Mitral valve: Calcified annulus. Mild to moderate regurgitation directed centrally. - Left atrium: The atrium was severely dilated. - Right atrium: The atrium was moderately dilated    Current Outpatient Prescriptions on File Prior to Visit  Medication Sig Dispense Refill  . amiodarone (PACERONE) 200 MG tablet Take 1 tablet (200 mg total) by mouth 2 (two) times daily.  180 tablet  3  . bromocriptine (PARLODEL) 2.5 MG tablet Take 1 tablet (2.5 mg total) by mouth daily.  180 tablet  1  . calcium carbonate (OS-CAL) 600 MG TABS Take 600 mg by mouth daily.       . Cholecalciferol (VITAMIN D PO) Take 800 Units by mouth daily.        . colesevelam (WELCHOL) 625 MG tablet Take 3 tablets (1,875 mg total) by mouth 2 (two) times daily with a meal.  540 tablet  1  . furosemide (LASIX) 20 MG tablet Take 1 tablet (20 mg total) by mouth daily as needed.  90 tablet  2  . glucose blood (BAYER CONTOUR NEXT TEST) test strip Use as instructed  100 each  12  . glucose blood test strip 1 each by Other route  as needed for other. Use as instructed      . metoprolol (LOPRESSOR) 50 MG tablet Take 1 tablet (50 mg total) by mouth 2 (two) times daily.  180 tablet  3  . Multiple Vitamin (MULITIVITAMIN WITH MINERALS) TABS Take 1 tablet by mouth daily.      . nateglinide (STARLIX) 120 MG tablet Take 1 tablet by mouth 3  times a day before meals  270 tablet  3  . polyethylene glycol powder (GLYCOLAX/MIRALAX) powder Take 17 g by mouth as needed.      . ramipril (ALTACE) 10 MG capsule Take 1 capsule (10 mg total) by mouth 2 (two) times daily.  180 capsule  3  . saxagliptin HCl (ONGLYZA) 5 MG TABS tablet Take 1  tablet (5 mg total) by mouth daily.  90 tablet  0  . simvastatin (ZOCOR) 20 MG tablet Labs overdue, 1 by mouth at bedtime  90 tablet  2  . warfarin (COUMADIN) 2.5 MG tablet Take as directed by Coumadin clinic  90 tablet  1   No current facility-administered medications on file prior to visit.    Allergies  Allergen Reactions  . Morphine And Related Itching, Nausea And Vomiting and Rash  . Actos [Pioglitazone Hydrochloride] Swelling  . Januvia [Sitagliptin Phosphate] Other (See Comments)    Pt says this caused elevated liver enzymes and creatinine  . Percocet [Oxycodone-Acetaminophen] Nausea And Vomiting  . Hydrocodone-Acetaminophen Itching and Nausea And Vomiting    Past Medical History  Diagnosis Date  . HTN (hypertension)   . Hyperlipemia   . Diabetes mellitus     x 10 yrs  . PONV (postoperative nausea and vomiting)   . Atrial fibrillation     CLEARED BY DR Acie Fredrickson 05/27/11  . PAF (paroxysmal atrial fibrillation) 1/14-16/14    Past Surgical History  Procedure Laterality Date  . Cataract extraction  05/25/07    RIGHT EYE  . Appendectomy    . Carpal tunnel release    . Abdominal hysterectomy    . Lumbar laminectomy    . Foot surgery  2006    MID FOOT RECONSTRUCTION  . Osteotomy  2006    AND FUSION  . Ganglion cyst excision  12/2007    Dr.Gramig  . Total knee arthroplasty  05/02/08, 07/25/08    Dr.Alusio  . Joint replacement      bilateral  . 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 Colonoscopy  01/31/2009    small external/internal hemorrhoids, diverticulosis in sigmoid colon, one small polyp (biopsied). Biospy essentially normal. Dr.Magod  . Colonoscopy  04/30/2012    Procedure: COLONOSCOPY;  Surgeon: Cleotis Nipper, MD;  Location: [facility] ENDOSCOPY;  Service: Endoscopy;  Laterality: N/A;  unprepped, poss flex only    History  Smoking status  . Never Smoker   Smokeless tobacco  . Never Used    History  Alcohol Use No    Family History  Problem  Relation Age of Onset  . Coronary artery disease Father   . Hypertension Father   . Stroke Father   . Diabetes Father   . Kidney failure Mother   . Hypertension Mother   . Other Brother     SEPSIS  . Diabetes Brother   . Diabetes Sister   . Kidney failure Sister     Reviw of Systems:  Reviewed in the HPI.  All other systems are negative.  Physical Exam: Blood pressure 144/96, pulse 80, height 5\' 4"  (1.626 m),  weight 191 lb (86.637 kg). General: Well developed, well nourished, in no acute distress.  Head: Normocephalic, atraumatic, sclera non-icteric, mucus membranes are moist,   Neck: Supple. Negative for carotid bruits. JVD not elevated.  Lungs: Clear bilaterally to auscultation without wheezes, rales, or rhonchi. Breathing is unlabored.  Heart: RRR with S1 S2. No murmurs, rubs, or gallops appreciated.  Abdomen: Soft, non-tender, non-distended with normoactive bowel sounds. No hepatomegaly. No rebound/guarding. No obvious abdominal masses.  Msk:  Strength and tone appear normal for age.  Extremities: No clubbing or cyanosis. No edema.  Distal pedal pulses are 2+ and equal bilaterally.  Neuro: Alert and oriented X 3. Moves all extremities spontaneously.  Psych:  Responds to questions appropriately with a normal affect.  ECG: Oct. 23, 2014:  Atrial flutter with variable AV block  Assessment / Plan:

## 2013-09-22 NOTE — Assessment & Plan Note (Addendum)
Becky Forbes seems to be feeling quite a bit better. She still is in atrial fibrillation. [name] been adequately loaded with amiodarone. We discussed the possibility of cardioversion. She wants to wait and see if her heart will straighten itself out or not do a cardioversion at this time. I'll see her again in 3 months for followup office visit and EKG. We'll discuss cardioversion further at that time.    Her INR levels have been satisfactory.  Will decrease her amiodarone to 200 mg a day.

## 2013-09-22 NOTE — Patient Instructions (Signed)
Your physician has recommended you make the following change in your medication:  1. DECREASE AMIODARONE 200 MG DAILY  Your physician recommends that you schedule a follow-up appointment in: [place] physician recommends that you continue on your current medications as directed. Please refer to the Current Medication list given to you today.

## 2013-09-22 NOTE — Assessment & Plan Note (Signed)
Left ventricular systolic function seems to be gradually improving. We'll continue with heart rate control and blood pressure control

## 2013-10-04 DIAGNOSIS — Z1231 Encounter for screening mammogram for malignant neoplasm of breast: Secondary | ICD-10-CM | POA: Diagnosis not present

## 2013-10-06 DIAGNOSIS — M545 Low back pain, unspecified: Secondary | ICD-10-CM | POA: Diagnosis not present

## 2013-10-06 DIAGNOSIS — IMO0002 Reserved for concepts with insufficient information to code with codable children: Secondary | ICD-10-CM | POA: Diagnosis not present

## 2013-10-06 DIAGNOSIS — E1139 Type 2 diabetes mellitus with other diabetic ophthalmic complication: Secondary | ICD-10-CM | POA: Diagnosis not present

## 2013-10-06 DIAGNOSIS — H35359 Cystoid macular degeneration, unspecified eye: Secondary | ICD-10-CM | POA: Diagnosis not present

## 2013-10-06 DIAGNOSIS — Z6832 Body mass index (BMI) 32.0-32.9, adult: Secondary | ICD-10-CM | POA: Diagnosis not present

## 2013-10-06 DIAGNOSIS — E11311 Type 2 diabetes mellitus with unspecified diabetic retinopathy with macular edema: Secondary | ICD-10-CM | POA: Diagnosis not present

## 2013-10-06 DIAGNOSIS — M47817 Spondylosis without myelopathy or radiculopathy, lumbosacral region: Secondary | ICD-10-CM | POA: Diagnosis not present

## 2013-10-07 ENCOUNTER — Ambulatory Visit (INDEPENDENT_AMBULATORY_CARE_PROVIDER_SITE_OTHER): Payer: Medicare Other | Admitting: *Deleted

## 2013-10-07 DIAGNOSIS — I4891 Unspecified atrial fibrillation: Secondary | ICD-10-CM

## 2013-10-07 DIAGNOSIS — Z7901 Long term (current) use of anticoagulants: Secondary | ICD-10-CM

## 2013-10-07 DIAGNOSIS — Z5181 Encounter for therapeutic drug level monitoring: Secondary | ICD-10-CM | POA: Diagnosis not present

## 2013-10-07 LAB — POCT INR: INR: 2.6

## 2013-10-13 ENCOUNTER — Encounter: Payer: Self-pay | Admitting: Endocrinology

## 2013-10-13 ENCOUNTER — Ambulatory Visit (INDEPENDENT_AMBULATORY_CARE_PROVIDER_SITE_OTHER): Payer: Medicare Other | Admitting: Endocrinology

## 2013-10-13 VITALS — BP 116/66 | HR 85 | Temp 98.3°F | Ht 64.0 in | Wt 191.0 lb

## 2013-10-13 DIAGNOSIS — E1165 Type 2 diabetes mellitus with hyperglycemia: Secondary | ICD-10-CM | POA: Diagnosis not present

## 2013-10-13 DIAGNOSIS — E1139 Type 2 diabetes mellitus with other diabetic ophthalmic complication: Secondary | ICD-10-CM | POA: Diagnosis not present

## 2013-10-13 LAB — HEMOGLOBIN A1C: Hgb A1c MFr Bld: 8 % — ABNORMAL HIGH (ref 4.6–6.5)

## 2013-10-13 LAB — MICROALBUMIN / CREATININE URINE RATIO
CREATININE, U: 96.5 mg/dL
Microalb Creat Ratio: 3.4 mg/g (ref 0.0–30.0)
Microalb, Ur: 3.3 mg/dL — ABNORMAL HIGH (ref 0.0–1.9)

## 2013-10-13 MED ORDER — REPAGLINIDE 2 MG PO TABS: 2.0000 mg | ORAL_TABLET | Freq: Three times a day (TID) | ORAL | Status: DC

## 2013-10-13 NOTE — Progress Notes (Signed)
Subjective:    Patient ID: Becky Forbes, female    DOB: 10-Aug-1940, 73 y.o.   MRN: [id number]  HPI Pt returns for f/u of type 2 DM (dx'ed 1998, on a routine blood test; she has mild if any neuropathy of the lower extremities, but she has associated nephropathy and retinopathy; she is not on metformin due to renal insuff; she had edema on actos, and diarrhea on acarbose; she could not afford invokana; she takes 4 oral DM meds; she has never been on insulin).  pt states she feels well in general.  no cbg record, but states cbg's are well-controlled.   Past Medical History  Diagnosis Date  . HTN (hypertension)   . Hyperlipemia   . Diabetes mellitus     x 10 yrs  . PONV (postoperative nausea and vomiting)   . Atrial fibrillation     CLEARED BY DR Acie Fredrickson 05/27/11  . PAF (paroxysmal atrial fibrillation) 1/14-16/14    Past Surgical History  Procedure Laterality Date  . Cataract extraction  05/25/07    RIGHT EYE  . Appendectomy    . Carpal tunnel release    . Abdominal hysterectomy    . Lumbar laminectomy    . Foot surgery  2006    MID FOOT RECONSTRUCTION  . Osteotomy  2006    AND FUSION  . Ganglion cyst excision  12/2007    Dr.Gramig  . Total knee arthroplasty  05/02/08, 07/25/08    Dr.Alusio  . Joint replacement      bilateral  . 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 Colonoscopy  01/31/2009    small external/internal hemorrhoids, diverticulosis in sigmoid colon, one small polyp (biopsied). Biospy essentially normal. Dr.Magod  . Colonoscopy  04/30/2012    Procedure: COLONOSCOPY;  Surgeon: Cleotis Nipper, MD;  Location: [facility] ENDOSCOPY;  Service: Endoscopy;  Laterality: N/A;  unprepped, poss flex only    History   Social History  . Marital Status: Married    Spouse Name: JAMES    Number of Children: 2  . Years of Education: N/A   Occupational History  . 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 Smoking status:  Never Smoker   . Smokeless tobacco: Never Used  . Alcohol Use: No  . Drug Use: No  . Sexual Activity: Not Currently   Other Topics Concern  . Not on file   Social History Narrative  . No narrative on file    Current Outpatient Prescriptions on File Prior to Visit  Medication Sig Dispense Refill  . amiodarone (PACERONE) 200 MG tablet Take 1 tablet (200 mg total) by mouth daily.  90 tablet  3  . bromocriptine (PARLODEL) 2.5 MG tablet Take 1 tablet (2.5 mg total) by mouth daily.  180 tablet  1  . calcium carbonate (OS-CAL) 600 MG TABS Take 600 mg by mouth daily.       . Cholecalciferol (VITAMIN D PO) Take 800 Units by mouth daily.        . colesevelam (WELCHOL) 625 MG tablet Take 3 tablets (1,875 mg total) by mouth 2 (two) times daily with a meal.  540 tablet  1  . furosemide (LASIX) 20 MG tablet Take 1 tablet (20 mg total) by mouth daily as needed.  90 tablet  2  . glucose blood (BAYER CONTOUR NEXT TEST) test strip Use as instructed  100 each  12  .  glucose blood test strip 1 each by Other route as needed for other. Use as instructed      . metoprolol (LOPRESSOR) 50 MG tablet Take 1 tablet (50 mg total) by mouth 2 (two) times daily.  180 tablet  3  . Multiple Vitamin (MULITIVITAMIN WITH MINERALS) TABS Take 1 tablet by mouth daily.      . polyethylene glycol powder (GLYCOLAX/MIRALAX) powder Take 17 g by mouth as needed.      . ramipril (ALTACE) 10 MG capsule Take 1 capsule (10 mg total) by mouth 2 (two) times daily.  180 capsule  3  . saxagliptin HCl (ONGLYZA) 5 MG TABS tablet Take 1 tablet (5 mg total) by mouth daily.  90 tablet  0  . simvastatin (ZOCOR) 20 MG tablet Labs overdue, 1 by mouth at bedtime  90 tablet  2  . warfarin (COUMADIN) 2.5 MG tablet Take as directed by Coumadin clinic  90 tablet  1   No current facility-administered medications on file prior to visit.    Allergies  Allergen Reactions  . Morphine And Related Itching, Nausea And Vomiting and Rash  . Actos  [Pioglitazone Hydrochloride] Swelling  . Januvia [Sitagliptin Phosphate] Other (See Comments)    Pt says this caused elevated liver enzymes and creatinine  . Percocet [Oxycodone-Acetaminophen] Nausea And Vomiting  . Hydrocodone-Acetaminophen Itching and Nausea And Vomiting    Family History  Problem Relation Age of Onset  . Coronary artery disease Father   . Hypertension Father   . Stroke Father   . Diabetes Father   . Kidney failure Mother   . Hypertension Mother   . Other Brother     SEPSIS  . Diabetes Brother   . Diabetes Sister   . Kidney failure Sister     BP 116/66  Pulse 85  Temp(Src) 98.3 F (36.8 C) (Oral)  Ht 5\' 4"  (1.626 m)  Wt 191 lb (86.637 kg)  BMI 32.77 kg/m2  SpO2 98%  Review of Systems She denies hypoglycemia and weight change.      Objective:   Physical Exam VITAL SIGNS:  See vs page. GENERAL: no distress. SKIN:  Insulin injection sites at the anterior abdomen are normal.     Lab Results  Component Value Date   HGBA1C 8.0* 10/13/2013      Assessment & Plan:  DM: moderate exacerbation: i advised insulin, and she declines.   Noncompliance with advice to take insulin: I'll work around this as best I can.  She declines to even see DM educator to discuss.   Renal insuff: this prevents rx with metformin.  This impairs the ability to achieve glycemic control.  I'll work around this as best I can Edema: this limits rx with actos.  This impairs the ability to achieve glycemic control.  I'll work around this as best I can.   Patient is advised the following:  Patient Instructions  blood tests are being requested for you today.  We'll contact you with results.   If it is high, we can change the nateglinide to "repaglinide."   Let me know if you decide to take insulin.   check your blood sugar once a day.  vary the time of day when you check, between before the 3 meals, and at bedtime.  also check if you have symptoms of your blood sugar being too high or  too low.  please keep a record of the readings and bring it to your next appointment here.  please call  us sooner if your blood sugar goes below 70, or if it stays over 200.  Please come back for a follow-up appointment in 3 months.

## 2013-10-13 NOTE — Patient Instructions (Signed)
blood tests are being requested for you today.  We'll contact you with results.   If it is high, we can change the nateglinide to "repaglinide."   Let me know if you decide to take insulin.   check your blood sugar once a day.  vary the time of day when you check, between before the 3 meals, and at bedtime.  also check if you have symptoms of your blood sugar being too high or too low.  please keep a record of the readings and bring it to your next appointment here.  please call us sooner if your blood sugar goes below 70, or if it stays over 200.  Please come back for a follow-up appointment in 3 months.

## 2013-10-14 ENCOUNTER — Telehealth: Payer: Self-pay | Admitting: Endocrinology

## 2013-10-14 NOTE — Telephone Encounter (Signed)
Pt would like her lab results °

## 2013-10-14 NOTE — Telephone Encounter (Signed)
Pt advised of lab results.  

## 2013-10-25 ENCOUNTER — Ambulatory Visit (INDEPENDENT_AMBULATORY_CARE_PROVIDER_SITE_OTHER): Payer: Medicare Other

## 2013-10-25 DIAGNOSIS — I4891 Unspecified atrial fibrillation: Secondary | ICD-10-CM

## 2013-10-25 DIAGNOSIS — Z7901 Long term (current) use of anticoagulants: Secondary | ICD-10-CM | POA: Diagnosis not present

## 2013-10-25 DIAGNOSIS — Z5181 Encounter for therapeutic drug level monitoring: Secondary | ICD-10-CM

## 2013-10-25 LAB — POCT INR: INR: 1.8

## 2013-10-26 ENCOUNTER — Telehealth: Payer: Self-pay | Admitting: Endocrinology

## 2013-10-26 ENCOUNTER — Encounter: Payer: Self-pay | Admitting: Internal Medicine

## 2013-10-26 NOTE — Telephone Encounter (Signed)
Patient states that onglyza is a high payment and she cant afford this  Is there something else she can take  Thank you

## 2013-10-26 NOTE — Telephone Encounter (Signed)
Please try stopping it, and continue the other DM meds. We'll see how your a1c is next time.

## 2013-10-26 NOTE — Telephone Encounter (Signed)
Pt advised.

## 2013-10-26 NOTE — Telephone Encounter (Signed)
See below and please advise, Thanks!  

## 2013-11-08 ENCOUNTER — Ambulatory Visit (INDEPENDENT_AMBULATORY_CARE_PROVIDER_SITE_OTHER): Payer: Medicare Other | Admitting: Pharmacist

## 2013-11-08 ENCOUNTER — Ambulatory Visit: Payer: Medicare Other | Admitting: Cardiovascular Disease

## 2013-11-08 DIAGNOSIS — I4891 Unspecified atrial fibrillation: Secondary | ICD-10-CM | POA: Diagnosis not present

## 2013-11-08 DIAGNOSIS — Z5181 Encounter for therapeutic drug level monitoring: Secondary | ICD-10-CM

## 2013-11-08 DIAGNOSIS — Z7901 Long term (current) use of anticoagulants: Secondary | ICD-10-CM

## 2013-11-08 LAB — POCT INR: INR: 2

## 2013-11-10 ENCOUNTER — Other Ambulatory Visit: Payer: Self-pay | Admitting: Neurosurgery

## 2013-11-10 DIAGNOSIS — M5416 Radiculopathy, lumbar region: Secondary | ICD-10-CM

## 2013-11-16 ENCOUNTER — Ambulatory Visit
Admission: RE | Admit: 2013-11-16 | Discharge: 2013-11-16 | Disposition: A | Discharge status: Home / Self Care | Payer: Medicare Other | Source: Ambulatory Visit | Attending: Neurosurgery | Admitting: Neurosurgery

## 2013-11-16 DIAGNOSIS — M5416 Radiculopathy, lumbar region: Secondary | ICD-10-CM

## 2013-11-16 DIAGNOSIS — M48061 Spinal stenosis, lumbar region without neurogenic claudication: Secondary | ICD-10-CM | POA: Diagnosis not present

## 2013-11-22 DIAGNOSIS — H35359 Cystoid macular degeneration, unspecified eye: Secondary | ICD-10-CM | POA: Diagnosis not present

## 2013-11-22 DIAGNOSIS — E11311 Type 2 diabetes mellitus with unspecified diabetic retinopathy with macular edema: Secondary | ICD-10-CM | POA: Diagnosis not present

## 2013-11-22 DIAGNOSIS — E1139 Type 2 diabetes mellitus with other diabetic ophthalmic complication: Secondary | ICD-10-CM | POA: Diagnosis not present

## 2013-11-29 ENCOUNTER — Ambulatory Visit (INDEPENDENT_AMBULATORY_CARE_PROVIDER_SITE_OTHER): Payer: Medicare Other | Admitting: *Deleted

## 2013-11-29 DIAGNOSIS — I4891 Unspecified atrial fibrillation: Secondary | ICD-10-CM | POA: Diagnosis not present

## 2013-11-29 DIAGNOSIS — Z5181 Encounter for therapeutic drug level monitoring: Secondary | ICD-10-CM | POA: Diagnosis not present

## 2013-11-29 DIAGNOSIS — Z7901 Long term (current) use of anticoagulants: Secondary | ICD-10-CM

## 2013-11-29 LAB — POCT INR: INR: 1.5

## 2013-12-02 DIAGNOSIS — M545 Low back pain, unspecified: Secondary | ICD-10-CM | POA: Diagnosis not present

## 2013-12-02 DIAGNOSIS — M5412 Radiculopathy, cervical region: Secondary | ICD-10-CM | POA: Diagnosis not present

## 2013-12-02 DIAGNOSIS — M542 Cervicalgia: Secondary | ICD-10-CM | POA: Diagnosis not present

## 2013-12-02 DIAGNOSIS — M47817 Spondylosis without myelopathy or radiculopathy, lumbosacral region: Secondary | ICD-10-CM | POA: Diagnosis not present

## 2013-12-13 ENCOUNTER — Ambulatory Visit (INDEPENDENT_AMBULATORY_CARE_PROVIDER_SITE_OTHER): Payer: Medicare Other

## 2013-12-13 DIAGNOSIS — Z5181 Encounter for therapeutic drug level monitoring: Secondary | ICD-10-CM | POA: Diagnosis not present

## 2013-12-13 DIAGNOSIS — I4891 Unspecified atrial fibrillation: Secondary | ICD-10-CM

## 2013-12-13 DIAGNOSIS — Z7901 Long term (current) use of anticoagulants: Secondary | ICD-10-CM

## 2013-12-13 LAB — POCT INR: INR: 1.8

## 2013-12-23 ENCOUNTER — Ambulatory Visit (INDEPENDENT_AMBULATORY_CARE_PROVIDER_SITE_OTHER): Payer: Medicare Other | Admitting: Cardiovascular Disease

## 2013-12-23 ENCOUNTER — Encounter: Payer: Self-pay | Admitting: Cardiovascular Disease

## 2013-12-23 VITALS — BP 162/88 | HR 63 | Ht 64.0 in | Wt 189.0 lb

## 2013-12-23 DIAGNOSIS — I4891 Unspecified atrial fibrillation: Secondary | ICD-10-CM

## 2013-12-23 DIAGNOSIS — Z5181 Encounter for therapeutic drug level monitoring: Secondary | ICD-10-CM | POA: Diagnosis not present

## 2013-12-23 DIAGNOSIS — I5022 Chronic systolic (congestive) heart failure: Secondary | ICD-10-CM | POA: Diagnosis not present

## 2013-12-23 DIAGNOSIS — Z79899 Other long term (current) drug therapy: Secondary | ICD-10-CM

## 2013-12-23 DIAGNOSIS — E785 Hyperlipidemia, unspecified: Secondary | ICD-10-CM

## 2013-12-23 DIAGNOSIS — I480 Paroxysmal atrial fibrillation: Secondary | ICD-10-CM

## 2013-12-23 DIAGNOSIS — I4892 Unspecified atrial flutter: Secondary | ICD-10-CM

## 2013-12-23 DIAGNOSIS — I509 Heart failure, unspecified: Secondary | ICD-10-CM

## 2013-12-23 LAB — BASIC METABOLIC PANEL
BUN: 23 mg/dL (ref 6–23)
CHLORIDE: 100 meq/L (ref 96–112)
CO2: 30 mEq/L (ref 19–32)
Calcium: 9.5 mg/dL (ref 8.4–10.5)
Creatinine, Ser: 1.4 mg/dL — ABNORMAL HIGH (ref 0.4–1.2)
GFR: 39.73 mL/min — AB (ref >60.00)
Glucose, Bld: 105 mg/dL — ABNORMAL HIGH (ref 70–99)
Potassium: 4.1 mEq/L (ref 3.5–5.1)
Sodium: 139 mEq/L (ref 135–145)

## 2013-12-23 LAB — LIPID PANEL
CHOLESTEROL: 166 mg/dL (ref 0–200)
HDL: 36.1 mg/dL — AB (ref >39.00)
LDL CALC: 99 mg/dL (ref 0–99)
NonHDL: 129.9
TRIGLYCERIDES: 155 mg/dL — AB (ref 0.0–149.0)
Total CHOL/HDL Ratio: 5
VLDL: 31 mg/dL (ref 0.0–40.0)

## 2013-12-23 LAB — HEPATIC FUNCTION PANEL
ALT: 17 U/L (ref 0–35)
AST: 24 U/L (ref 0–37)
Albumin: 3.8 g/dL (ref 3.5–5.2)
Alkaline Phosphatase: 91 U/L (ref 39–117)
Bilirubin, Direct: 0 mg/dL (ref 0.0–0.3)
TOTAL PROTEIN: 7.7 g/dL (ref 6.0–8.3)
Total Bilirubin: 1 mg/dL (ref 0.2–1.2)

## 2013-12-23 LAB — CBC
HCT: 36.3 % (ref 36.0–46.0)
HEMOGLOBIN: 11.9 g/dL — AB (ref 12.0–15.0)
MCHC: 32.8 g/dL (ref 30.0–36.0)
MCV: 101.7 fl — ABNORMAL HIGH (ref 78.0–100.0)
Platelets: 282 10*3/uL (ref 150.0–400.0)
RBC: 3.57 Mil/uL — AB (ref 3.87–5.11)
RDW: 16.3 % — AB (ref 11.5–15.5)
WBC: 7.8 10*3/uL (ref 4.0–10.5)

## 2013-12-23 LAB — TSH: TSH: 2.79 u[IU]/mL (ref 0.35–4.50)

## 2013-12-23 MED ORDER — ATORVASTATIN CALCIUM 10 MG PO TABS: 10.0000 mg | ORAL_TABLET | Freq: Every day | ORAL | Status: DC

## 2013-12-23 NOTE — Assessment & Plan Note (Signed)
Her left ventricular systolic function has gradually improved. I think this is largely due to to her rate control and medical therapy.  Becky Forbes continue with her current medications. We'll recheck an echocardiogram in 6 months for further evaluation

## 2013-12-23 NOTE — Progress Notes (Signed)
Becky Forbes Date of Birth  11/30/1940 [place]  [address]    [name], [name]  [address]    [place],   [id number] [phone]  Fax  [phone]  [phone]  Fax [phone]  Problem list: 1. Atrial fibrillation / atrial Flutter - paroxysmal 2. Diabetes mellitus 3. Hypertension 4. Hyperlipidemia 5. Chronic systolic CHF - EF 63-89%  (assumed to be nonischemic)   History of Present Illness:  [name] not had any episodes of chest pain or shortness of breath.   She had back surgery earlier this year. [name] not had any chest pain or shortness breath. Her blood pressure has been well controlled.  [name] not had any episodes of atrial fibrillation.  Feb. 4, 2014: She was admitted to the hospital in mid January with a GI bleed and was noted to be in atrial fibrillation.  She had an echocardiogram which revealed an ejection fraction of 35-40%. She started on Lasix 20 twice a day and metoprolol. She had some blood work drawn last week which revealed a mild increase in her creatinine up to 1.9. She was also found to have a potassium of 6.1. Recheck potassium level was 4.6.  [name] noted that her blood pressures been quite low since she left the hospital. She also was having symptoms of orthostatic hypotension.  July 23, 2012:  Becky Forbes is doing well.  No further episodes of Afib and no blood in stool.  She was admitted to the hospital in January with GI bleeding was found to have atrial fibrillation. Workup at that time revealed what appeared to be ischemic colitis. Her aspirin was held for a week. She improved and has not had any further bleeding. We have been able to restart Coumadin she's not had any bleeding. The bruising that she was having is now better since she has been off the aspirin.  She has a nonischemic cardiomyopathy. Echocardiogram in January revealed an ejection fraction of 35-40%. She has  moderate pulmonary  hypertension with an estimated PA pressure of 49. I have  reviewed the echo and the results are noted below. Left ventricle: The cavity size was normal. Wall thickness was increased in a pattern of mild LVH. Systolic function was moderately reduced. The estimated ejection fraction was in the range of 35% to 40%. Diffuse hypokinesis. - Mitral valve: Mild regurgitation. - Left atrium: The atrium was mildly dilated. - Right ventricle: The cavity size was mildly dilated. Systolic function was mildly reduced. - Right atrium: The atrium was mildly dilated. - Pulmonary arteries: Systolic pressure was moderately increased. PA peak pressure: 75mm Hg   She continues to have generalized fatigue and malaise.  She has chronic renal insufficiency.  Oct. 23, 2014:  Becky Forbes is in atrial flutter today.  No symptoms.  breathing is ok.  She walks on occasion.  [name] not able to tell if her heart rate is irregular or not.  August 03, 2013:   Becky Forbes is doing well.  No Cp or dyspnea.  Perhaps mild DOE with walking  Echo April 2015 showed improved LV function.  Left ventricle: The cavity size was normal. Systolic function was mildly to moderately reduced. The estimated ejection fraction was in the range of 40% to 45%. Severe hypokinesis of the basal-midinferolateral and inferior myocardium. The study is not technically sufficient to allow evaluation of LV diastolic function. - Mitral valve: Calcified annulus. Mild to moderate regurgitation directed  centrally. - Left atrium: The atrium was severely dilated. - Right atrium: The atrium was moderately dilated  September 22, 2013: Becky Forbes is feeling a little bit better. She seems to be tolerating the amiodarone better than the high dose of metoprolol. [name] not having episodes of chest pain. She thinks her heart rate is better.  Her blood pressure here in the office is a little bit elevated. Her blood pressures at home have been all in the normal range.  Sept. 10,  2015:  Becky Forbes is feeling quite well. She has not noticed any heart rate irregularities.  She has felt a lot better since decreasing the dose of amiodarone to 200 mg a day.  Current Outpatient Prescriptions on File Prior to Visit  Medication Sig Dispense Refill  . amiodarone (PACERONE) 200 MG tablet Take 1 tablet (200 mg total) by mouth daily.  90 tablet  3  . calcium carbonate (OS-CAL) 600 MG TABS Take 600 mg by mouth daily.       . Cholecalciferol (VITAMIN D PO) Take 800 Units by mouth daily.        . furosemide (LASIX) 20 MG tablet Take 1 tablet (20 mg total) by mouth daily as needed.  90 tablet  2  . glucose blood (BAYER CONTOUR NEXT TEST) test strip Use as instructed  100 each  12  . glucose blood test strip 1 each by Other route as needed for other. Use as instructed      . metoprolol (LOPRESSOR) 50 MG tablet Take 1 tablet (50 mg total) by mouth 2 (two) times daily.  180 tablet  3  . Multiple Vitamin (MULITIVITAMIN WITH MINERALS) TABS Take 1 tablet by mouth daily.      . ramipril (ALTACE) 10 MG capsule Take 1 capsule (10 mg total) by mouth 2 (two) times daily.  180 capsule  3  . repaglinide (PRANDIN) 2 MG tablet Take 1 tablet (2 mg total) by mouth 3 (three) times daily before meals.  90 tablet  11  . warfarin (COUMADIN) 2.5 MG tablet Take as directed by Coumadin clinic  90 tablet  1   No current facility-administered medications on file prior to visit.    Allergies  Allergen Reactions  . Morphine And Related Itching, Nausea And Vomiting and Rash  . Actos [Pioglitazone Hydrochloride] Swelling  . Januvia [Sitagliptin Phosphate] Other (See Comments)    Pt says this caused elevated liver enzymes and creatinine  . Percocet [Oxycodone-Acetaminophen] Nausea And Vomiting  . Hydrocodone-Acetaminophen Itching and Nausea And Vomiting    Past Medical History  Diagnosis Date  . HTN (hypertension)   . Hyperlipemia   . Diabetes mellitus     x 10 yrs  . PONV (postoperative nausea and  vomiting)   . Atrial fibrillation     CLEARED BY DR Acie Fredrickson 05/27/11  . PAF (paroxysmal atrial fibrillation) 1/14-16/14    Past Surgical History  Procedure Laterality Date  . Cataract extraction  05/25/07    RIGHT EYE  . Appendectomy    . Carpal tunnel release    . Abdominal hysterectomy    . Lumbar laminectomy    . Foot surgery  2006    MID FOOT RECONSTRUCTION  . Osteotomy  2006    AND FUSION  . Ganglion cyst excision  12/2007    Dr.Gramig  . Total knee arthroplasty  05/02/08, 07/25/08    Dr.Alusio  . Joint replacement      bilateral  . Pars plana vitrectomy w/ repair of macular  hole    . Colonoscopy  01/31/2009    small external/internal hemorrhoids, diverticulosis in sigmoid colon, one small polyp (biopsied). Biospy essentially normal. Dr.Magod  . Colonoscopy  04/30/2012    Procedure: COLONOSCOPY;  Surgeon: Cleotis Nipper, MD;  Location: [facility] ENDOSCOPY;  Service: Endoscopy;  Laterality: N/A;  unprepped, poss flex only    History  Smoking status  . Never Smoker   Smokeless tobacco  . Never Used    History  Alcohol Use No    Family History  Problem Relation Age of Onset  . Coronary artery disease Father   . Hypertension Father   . Stroke Father   . Diabetes Father   . Kidney failure Mother   . Hypertension Mother   . Other Brother     SEPSIS  . Diabetes Brother   . Diabetes Sister   . Kidney failure Sister     Reviw of Systems:  Reviewed in the HPI.  All other systems are negative.  Physical Exam: Blood pressure 162/88, pulse 63, height 5\' 4"  (1.626 m), weight 189 lb (85.73 kg). General: Well developed, well nourished, in no acute distress.  Head: Normocephalic, atraumatic, sclera non-icteric, mucus membranes are moist,   Neck: Supple. Negative for carotid bruits. JVD not elevated.  Lungs: Clear bilaterally to auscultation without wheezes, rales, or rhonchi. Breathing is unlabored.  Heart: irreg. Irreg.  with S1 S2. No murmurs, rubs, or gallops  appreciated.  Abdomen: Soft, non-tender, non-distended with normoactive bowel sounds. No hepatomegaly. No rebound/guarding. No obvious abdominal masses.  Msk:  Strength and tone appear normal for age.  Extremities: No clubbing or cyanosis. No edema.  Distal pedal pulses are 2+ and equal bilaterally.  Neuro: Alert and oriented X 3. Moves all extremities spontaneously.  Psych:  Responds to questions appropriately with a normal affect.  ECG: 12/23/2013: Atrial flutter with variable AV block. She is nonspecific ST and T wave changes  Assessment / Plan:

## 2013-12-23 NOTE — Patient Instructions (Addendum)
Your physician has recommended you make the following change in your medication:   1. Stop Simvastatin.  2. Start Atorvastatin 10 mg 1 tablet daily.   Your physician recommends that you return for lab work today for lipid, hepatic, cbc, bmet and tsh.  Your physician has requested that you have an echocardiogram 1 week prior to 6 month OV. Echocardiography is a painless test that uses sound waves to create images of your heart. It provides your doctor with information about the size and shape of your heart and how well your heart's chambers and valves are working. This procedure takes approximately one hour. There are no restrictions for this procedure.  Your physician wants you to follow-up in: 6 months with Dr. Acie Fredrickson. You will receive a reminder letter in the mail two months in advance. If you don't receive a letter, please call our office to schedule the follow-up appointment.

## 2013-12-23 NOTE — Assessment & Plan Note (Signed)
Becky Forbes  presents with atrial flutter. Her rate is very well controlled. She is completely asymptomatic and in fact thought that she might have converted back into sinus rhythm. She feels much better on the lower dose of amiodarone.  Once again to talk quite a bit about conversion. She feels so well that she does not want to consider cardioversion at this time. She will continue on anticoagulation.  We will change the simvastatin atorvastatin because of the interaction between simvastatin and amiodarone.  Will check TSH, CBC, lipids, basic mental profile, and liver enzymes today.

## 2013-12-28 ENCOUNTER — Ambulatory Visit (INDEPENDENT_AMBULATORY_CARE_PROVIDER_SITE_OTHER): Payer: Medicare Other | Admitting: *Deleted

## 2013-12-28 DIAGNOSIS — Z5181 Encounter for therapeutic drug level monitoring: Secondary | ICD-10-CM | POA: Diagnosis not present

## 2013-12-28 DIAGNOSIS — I4891 Unspecified atrial fibrillation: Secondary | ICD-10-CM

## 2013-12-28 DIAGNOSIS — Z7901 Long term (current) use of anticoagulants: Secondary | ICD-10-CM

## 2013-12-28 LAB — POCT INR: INR: 2.1

## 2014-01-10 DIAGNOSIS — E11311 Type 2 diabetes mellitus with unspecified diabetic retinopathy with macular edema: Secondary | ICD-10-CM | POA: Diagnosis not present

## 2014-01-10 DIAGNOSIS — E1139 Type 2 diabetes mellitus with other diabetic ophthalmic complication: Secondary | ICD-10-CM | POA: Diagnosis not present

## 2014-01-10 DIAGNOSIS — H35359 Cystoid macular degeneration, unspecified eye: Secondary | ICD-10-CM | POA: Diagnosis not present

## 2014-01-12 DIAGNOSIS — Z23 Encounter for immunization: Secondary | ICD-10-CM | POA: Diagnosis not present

## 2014-01-14 ENCOUNTER — Ambulatory Visit: Payer: Medicare Other | Admitting: Endocrinology

## 2014-01-17 ENCOUNTER — Ambulatory Visit (INDEPENDENT_AMBULATORY_CARE_PROVIDER_SITE_OTHER): Payer: Medicare Other | Admitting: *Deleted

## 2014-01-17 DIAGNOSIS — Z5181 Encounter for therapeutic drug level monitoring: Secondary | ICD-10-CM

## 2014-01-17 DIAGNOSIS — Z7901 Long term (current) use of anticoagulants: Secondary | ICD-10-CM

## 2014-01-17 DIAGNOSIS — I4891 Unspecified atrial fibrillation: Secondary | ICD-10-CM

## 2014-01-17 LAB — POCT INR: INR: 2.7

## 2014-01-18 ENCOUNTER — Ambulatory Visit (INDEPENDENT_AMBULATORY_CARE_PROVIDER_SITE_OTHER): Payer: Medicare Other | Admitting: Endocrinology

## 2014-01-18 ENCOUNTER — Telehealth: Payer: Self-pay | Admitting: Endocrinology

## 2014-01-18 ENCOUNTER — Encounter: Payer: Self-pay | Admitting: Endocrinology

## 2014-01-18 VITALS — BP 122/84 | HR 75 | Temp 98.1°F | Ht 64.0 in | Wt 196.0 lb

## 2014-01-18 DIAGNOSIS — E11329 Type 2 diabetes mellitus with mild nonproliferative diabetic retinopathy without macular edema: Secondary | ICD-10-CM

## 2014-01-18 DIAGNOSIS — E113299 Type 2 diabetes mellitus with mild nonproliferative diabetic retinopathy without macular edema, unspecified eye: Secondary | ICD-10-CM

## 2014-01-18 LAB — HEMOGLOBIN A1C: HEMOGLOBIN A1C: 7.6 % — AB (ref 4.6–6.5)

## 2014-01-18 MED ORDER — BROMOCRIPTINE MESYLATE 2.5 MG PO TABS: 2.5000 mg | ORAL_TABLET | Freq: Every day | ORAL | Status: DC

## 2014-01-18 NOTE — Progress Notes (Signed)
Subjective:    Patient ID: Becky Forbes, female    DOB: 05-Jan-1941, 73 y.o.   MRN: [id number]  HPI Pt returns for f/u of diabetes mellitus: DM type: 2 Dx'ed: 9509 Complications: nephropathy and retinopathy Therapy: several oral meds GDM: never DKA: never Severe hypoglycemia: never Pancreatitis: never Other: she is not on metformin due to renal insuff; she had edema on actos, and diarrhea on acarbose; she could not afford invokana.  she has never been on insulin Interval history:  She takes only the prandin, as she stopped the other DM meds.  no cbg record, but states cbg's vary from 90-200's.  It is in general higher as the day goes on.  Past Medical History  Diagnosis Date  . HTN (hypertension)   . Hyperlipemia   . Diabetes mellitus     x 10 yrs  . PONV (postoperative nausea and vomiting)   . Atrial fibrillation     CLEARED BY DR Acie Fredrickson 05/27/11  . PAF (paroxysmal atrial fibrillation) 1/14-16/14    Past Surgical History  Procedure Laterality Date  . Cataract extraction  05/25/07    RIGHT EYE  . Appendectomy    . Carpal tunnel release    . Abdominal hysterectomy    . Lumbar laminectomy    . Foot surgery  2006    MID FOOT RECONSTRUCTION  . Osteotomy  2006    AND FUSION  . Ganglion cyst excision  12/2007    Dr.Gramig  . Total knee arthroplasty  05/02/08, 07/25/08    Dr.Alusio  . Joint replacement      bilateral  . 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 Colonoscopy  01/31/2009    small external/internal hemorrhoids, diverticulosis in sigmoid colon, one small polyp (biopsied). Biospy essentially normal. Dr.Magod  . Colonoscopy  04/30/2012    Procedure: COLONOSCOPY;  Surgeon: Cleotis Nipper, MD;  Location: [facility] ENDOSCOPY;  Service: Endoscopy;  Laterality: N/A;  unprepped, poss flex only    History   Social History  . Marital Status: Married    Spouse Name: JAMES    Number of Children: 2  . Years of Education: N/A   Occupational History  . 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 Smoking status: Never Smoker   . Smokeless tobacco: Never Used  . Alcohol Use: No  . Drug Use: No  . Sexual Activity: Not Currently   Other Topics Concern  . Not on file   Social History Narrative  . No narrative on file    Current Outpatient Prescriptions on File Prior to Visit  Medication Sig Dispense Refill  . amiodarone (PACERONE) 200 MG tablet Take 1 tablet (200 mg total) by mouth daily.  90 tablet  3  . atorvastatin (LIPITOR) 10 MG tablet Take 1 tablet (10 mg total) by mouth daily.  90 tablet  3  . calcium carbonate (OS-CAL) 600 MG TABS Take 600 mg by mouth daily.       . Cholecalciferol (VITAMIN D PO) Take 800 Units by mouth daily.        . furosemide (LASIX) 20 MG tablet Take 1 tablet (20 mg total) by mouth daily as needed.  90 tablet  2  . glucose blood (BAYER CONTOUR NEXT TEST) test strip Use as instructed  100 each  12  . glucose blood test strip 1 each by Other route as needed for other. Use  as instructed      . metoprolol (LOPRESSOR) 50 MG tablet Take 1 tablet (50 mg total) by mouth 2 (two) times daily.  180 tablet  3  . Multiple Vitamin (MULITIVITAMIN WITH MINERALS) TABS Take 1 tablet by mouth daily.      . ramipril (ALTACE) 10 MG capsule Take 1 capsule (10 mg total) by mouth 2 (two) times daily.  180 capsule  3  . repaglinide (PRANDIN) 2 MG tablet Take 1 tablet (2 mg total) by mouth 3 (three) times daily before meals.  90 tablet  11  . warfarin (COUMADIN) 2.5 MG tablet Take as directed by Coumadin clinic  90 tablet  1   No current facility-administered medications on file prior to visit.    Allergies  Allergen Reactions  . Morphine And Related Itching, Nausea And Vomiting and Rash  . Actos [Pioglitazone Hydrochloride] Swelling  . Januvia [Sitagliptin Phosphate] Other (See Comments)    Pt says this caused elevated liver enzymes and creatinine  . Percocet [Oxycodone-Acetaminophen] Nausea  And Vomiting  . Hydrocodone-Acetaminophen Itching and Nausea And Vomiting    Family History  Problem Relation Age of Onset  . Coronary artery disease Father   . Hypertension Father   . Stroke Father   . Diabetes Father   . Kidney failure Mother   . Hypertension Mother   . Other Brother     SEPSIS  . Diabetes Brother   . Diabetes Sister   . Kidney failure Sister     BP 122/84  Pulse 75  Temp(Src) 98.1 F (36.7 C) (Oral)  Ht 5\' 4"  (1.626 m)  Wt 196 lb (88.905 kg)  BMI 33.63 kg/m2  SpO2 97%   Review of Systems She denies hypoglycemia.  She has gained weight.     Objective:   Physical Exam VITAL SIGNS:  See vs page.  GENERAL: no distress. Pulses: dorsalis pedis intact bilat.   Feet: no deformity.  no edema. There is bilateral onychomycosis.  Skin:  no ulcer on the feet.  normal color and temp. Neuro: sensation is intact to touch on the feet.   Lab Results  Component Value Date   HGBA1C 7.6* 01/18/2014       Assessment & Plan:  DM: mild exacerbation Noncompliance with cbg recording and meds: I'll work around this as best I can   Patient is advised the following: Patient Instructions  A diabetes blood test is requested for you today.  We'll contact you with results.   check your blood sugar once a day.  vary the time of day when you check, between before the 3 meals, and at bedtime.  also check if you have symptoms of your blood sugar being too high or too low.  please keep a record of the readings and bring it to your next appointment here.  please call us sooner if your blood sugar goes below 70, or if it stays over 200.  Please come back for a follow-up appointment in 3 months.     (addendum: add bromocriptine)

## 2014-01-18 NOTE — Telephone Encounter (Signed)
Pt returning megans call for results.

## 2014-01-18 NOTE — Patient Instructions (Addendum)
A diabetes blood test is requested for you today.  We'll contact you with results.   check your blood sugar once a day.  vary the time of day when you check, between before the 3 meals, and at bedtime.  also check if you have symptoms of your blood sugar being too high or too low.  please keep a record of the readings and bring it to your next appointment here.  please call us sooner if your blood sugar goes below 70, or if it stays over 200.  Please come back for a follow-up appointment in 3 months.

## 2014-01-18 NOTE — Telephone Encounter (Signed)
Pt advised that recent A1C was just a little high and that Bromocriptine has been added to her medications. Pt instructed to take 1/2 of a pill for the first week and then start taking 1 whole pill the second week. Pt voiced understanding and stated should be being taking medication.

## 2014-02-07 ENCOUNTER — Ambulatory Visit (INDEPENDENT_AMBULATORY_CARE_PROVIDER_SITE_OTHER): Payer: Medicare Other

## 2014-02-07 DIAGNOSIS — Z5181 Encounter for therapeutic drug level monitoring: Secondary | ICD-10-CM | POA: Diagnosis not present

## 2014-02-07 DIAGNOSIS — I4891 Unspecified atrial fibrillation: Secondary | ICD-10-CM | POA: Diagnosis not present

## 2014-02-07 DIAGNOSIS — I4892 Unspecified atrial flutter: Secondary | ICD-10-CM | POA: Diagnosis not present

## 2014-02-07 DIAGNOSIS — Z7901 Long term (current) use of anticoagulants: Secondary | ICD-10-CM | POA: Diagnosis not present

## 2014-02-07 LAB — POCT INR: INR: 1.9

## 2014-02-15 ENCOUNTER — Other Ambulatory Visit: Payer: Self-pay | Admitting: Cardiovascular Disease

## 2014-02-28 DIAGNOSIS — E11331 Type 2 diabetes mellitus with moderate nonproliferative diabetic retinopathy with macular edema: Secondary | ICD-10-CM | POA: Diagnosis not present

## 2014-03-07 ENCOUNTER — Ambulatory Visit (INDEPENDENT_AMBULATORY_CARE_PROVIDER_SITE_OTHER): Payer: Medicare Other | Admitting: *Deleted

## 2014-03-07 DIAGNOSIS — Z5181 Encounter for therapeutic drug level monitoring: Secondary | ICD-10-CM

## 2014-03-07 DIAGNOSIS — I4892 Unspecified atrial flutter: Secondary | ICD-10-CM | POA: Diagnosis not present

## 2014-03-07 DIAGNOSIS — I4891 Unspecified atrial fibrillation: Secondary | ICD-10-CM

## 2014-03-07 DIAGNOSIS — Z7901 Long term (current) use of anticoagulants: Secondary | ICD-10-CM | POA: Diagnosis not present

## 2014-03-07 LAB — POCT INR: INR: 2.9

## 2014-03-15 DIAGNOSIS — Z950 Presence of cardiac pacemaker: Secondary | ICD-10-CM

## 2014-03-15 HISTORY — DX: Presence of cardiac pacemaker: Z95.0

## 2014-03-28 ENCOUNTER — Encounter (HOSPITAL_COMMUNITY): Payer: Self-pay | Admitting: *Deleted

## 2014-03-28 ENCOUNTER — Emergency Department (HOSPITAL_COMMUNITY): Payer: Medicare Other

## 2014-03-28 ENCOUNTER — Inpatient Hospital Stay (HOSPITAL_COMMUNITY)
Admission: EM | Admit: 2014-03-28 | Discharge: 2014-03-31 | DRG: 242 | Disposition: A | Discharge status: Home / Self Care | Payer: Medicare Other | Attending: Cardiovascular Disease | Admitting: Cardiovascular Disease

## 2014-03-28 ENCOUNTER — Other Ambulatory Visit: Payer: Self-pay

## 2014-03-28 DIAGNOSIS — Z7901 Long term (current) use of anticoagulants: Secondary | ICD-10-CM

## 2014-03-28 DIAGNOSIS — E119 Type 2 diabetes mellitus without complications: Secondary | ICD-10-CM | POA: Diagnosis present

## 2014-03-28 DIAGNOSIS — I483 Typical atrial flutter: Secondary | ICD-10-CM | POA: Diagnosis not present

## 2014-03-28 DIAGNOSIS — I495 Sick sinus syndrome: Secondary | ICD-10-CM | POA: Diagnosis not present

## 2014-03-28 DIAGNOSIS — E785 Hyperlipidemia, unspecified: Secondary | ICD-10-CM | POA: Diagnosis present

## 2014-03-28 DIAGNOSIS — R001 Bradycardia, unspecified: Secondary | ICD-10-CM | POA: Diagnosis present

## 2014-03-28 DIAGNOSIS — I480 Paroxysmal atrial fibrillation: Secondary | ICD-10-CM | POA: Diagnosis present

## 2014-03-28 DIAGNOSIS — Z96653 Presence of artificial knee joint, bilateral: Secondary | ICD-10-CM | POA: Diagnosis present

## 2014-03-28 DIAGNOSIS — R0602 Shortness of breath: Secondary | ICD-10-CM | POA: Diagnosis not present

## 2014-03-28 DIAGNOSIS — I5043 Acute on chronic combined systolic (congestive) and diastolic (congestive) heart failure: Secondary | ICD-10-CM | POA: Diagnosis present

## 2014-03-28 DIAGNOSIS — Z950 Presence of cardiac pacemaker: Secondary | ICD-10-CM | POA: Diagnosis not present

## 2014-03-28 DIAGNOSIS — I059 Rheumatic mitral valve disease, unspecified: Secondary | ICD-10-CM | POA: Diagnosis not present

## 2014-03-28 DIAGNOSIS — R062 Wheezing: Secondary | ICD-10-CM | POA: Diagnosis not present

## 2014-03-28 DIAGNOSIS — E876 Hypokalemia: Secondary | ICD-10-CM | POA: Diagnosis present

## 2014-03-28 DIAGNOSIS — Z45018 Encounter for adjustment and management of other part of cardiac pacemaker: Secondary | ICD-10-CM | POA: Diagnosis not present

## 2014-03-28 DIAGNOSIS — I5023 Acute on chronic systolic (congestive) heart failure: Secondary | ICD-10-CM

## 2014-03-28 DIAGNOSIS — I4891 Unspecified atrial fibrillation: Secondary | ICD-10-CM

## 2014-03-28 DIAGNOSIS — N179 Acute kidney failure, unspecified: Secondary | ICD-10-CM | POA: Diagnosis not present

## 2014-03-28 DIAGNOSIS — N189 Chronic kidney disease, unspecified: Secondary | ICD-10-CM | POA: Diagnosis not present

## 2014-03-28 DIAGNOSIS — E118 Type 2 diabetes mellitus with unspecified complications: Secondary | ICD-10-CM | POA: Diagnosis not present

## 2014-03-28 LAB — PRO B NATRIURETIC PEPTIDE: PRO B NATRI PEPTIDE: 4959 pg/mL — AB (ref 0–125)

## 2014-03-28 LAB — 2D ECHOCARDIOGRAM WO CONTRAST

## 2014-03-28 LAB — BASIC METABOLIC PANEL
ANION GAP: 13 (ref 5–15)
BUN: 31 mg/dL — ABNORMAL HIGH (ref 6–23)
CO2: 23 mEq/L (ref 19–32)
CREATININE: 1.51 mg/dL — AB (ref 0.50–1.10)
Calcium: 9.1 mg/dL (ref 8.4–10.5)
Chloride: 104 mEq/L (ref 96–112)
GFR calc non Af Amer: 33 mL/min — ABNORMAL LOW (ref >90)
GFR, EST AFRICAN AMERICAN: 38 mL/min — AB (ref >90)
Glucose, Bld: 182 mg/dL — ABNORMAL HIGH (ref 70–99)
POTASSIUM: 4.6 meq/L (ref 3.7–5.3)
Sodium: 140 mEq/L (ref 137–147)

## 2014-03-28 LAB — CBC WITH DIFFERENTIAL/PLATELET
Basophils Absolute: 0 10*3/uL (ref 0.0–0.1)
Basophils Relative: 1 % (ref 0–1)
Eosinophils Absolute: 0.2 10*3/uL (ref 0.0–0.7)
Eosinophils Relative: 3 % (ref 0–5)
HCT: 30.3 % — ABNORMAL LOW (ref 36.0–46.0)
HEMOGLOBIN: 10 g/dL — AB (ref 12.0–15.0)
Lymphocytes Relative: 10 % — ABNORMAL LOW (ref 12–46)
Lymphs Abs: 0.8 10*3/uL (ref 0.7–4.0)
MCH: 33.3 pg (ref 26.0–34.0)
MCHC: 33 g/dL (ref 30.0–36.0)
MCV: 101 fL — ABNORMAL HIGH (ref 78.0–100.0)
MONOS PCT: 5 % (ref 3–12)
Monocytes Absolute: 0.4 10*3/uL (ref 0.1–1.0)
NEUTROS ABS: 6.7 10*3/uL (ref 1.7–7.7)
NEUTROS PCT: 81 % — AB (ref 43–77)
Platelets: 239 10*3/uL (ref 150–400)
RBC: 3 MIL/uL — ABNORMAL LOW (ref 3.87–5.11)
RDW: 13.6 % (ref 11.5–15.5)
WBC: 8.2 10*3/uL (ref 4.0–10.5)

## 2014-03-28 LAB — PROTIME-INR
INR: 2.19 — ABNORMAL HIGH (ref 0.00–1.49)
Prothrombin Time: 24.6 seconds — ABNORMAL HIGH (ref 11.6–15.2)

## 2014-03-28 LAB — GLUCOSE, CAPILLARY
GLUCOSE-CAPILLARY: 89 mg/dL (ref 70–99)
Glucose-Capillary: 144 mg/dL — ABNORMAL HIGH (ref 70–99)

## 2014-03-28 LAB — I-STAT TROPONIN, ED: TROPONIN I, POC: 0.01 ng/mL (ref 0.00–0.08)

## 2014-03-28 MED ORDER — SODIUM CHLORIDE 0.9 % IJ SOLN: 3.0000 mL | INTRAMUSCULAR | Status: DC | PRN

## 2014-03-28 MED ORDER — WARFARIN SODIUM 2.5 MG PO TABS
2.5000 mg | ORAL_TABLET | ORAL | Status: DC
Start: 2014-03-28 — End: 2014-03-30
  Administered 2014-03-28 – 2014-03-29 (×2): 2.5 mg via ORAL
  Filled 2014-03-28 (×4): qty 1

## 2014-03-28 MED ORDER — SODIUM CHLORIDE 0.9 % IV SOLN: 250.0000 mL | INTRAVENOUS | Status: DC | PRN

## 2014-03-28 MED ORDER — ACETAMINOPHEN 325 MG PO TABS: 650.0000 mg | ORAL_TABLET | ORAL | Status: DC | PRN

## 2014-03-28 MED ORDER — REPAGLINIDE 2 MG PO TABS
2.0000 mg | ORAL_TABLET | Freq: Three times a day (TID) | ORAL | Status: DC
  Administered 2014-03-28 – 2014-03-31 (×7): 2 mg via ORAL
  Filled 2014-03-28 (×12): qty 1

## 2014-03-28 MED ORDER — WARFARIN 1.25 MG HALF TABLET: 1.2500 mg | ORAL_TABLET | ORAL | Status: DC

## 2014-03-28 MED ORDER — FUROSEMIDE 10 MG/ML IJ SOLN
40.0000 mg | Freq: Once | INTRAMUSCULAR | Status: AC
  Administered 2014-03-28: 40 mg via INTRAVENOUS
  Filled 2014-03-28: qty 4

## 2014-03-28 MED ORDER — AMIODARONE HCL 200 MG PO TABS
200.0000 mg | ORAL_TABLET | Freq: Every day | ORAL | Status: DC
  Administered 2014-03-28 – 2014-03-29 (×2): 200 mg via ORAL
  Filled 2014-03-28 (×2): qty 1

## 2014-03-28 MED ORDER — FUROSEMIDE 10 MG/ML IJ SOLN
40.0000 mg | Freq: Every day | INTRAMUSCULAR | Status: DC
  Administered 2014-03-28 – 2014-03-29 (×2): 40 mg via INTRAVENOUS
  Filled 2014-03-28 (×2): qty 4

## 2014-03-28 MED ORDER — WARFARIN - PHARMACIST DOSING INPATIENT: Freq: Every day | Status: DC

## 2014-03-28 MED ORDER — BROMOCRIPTINE MESYLATE 2.5 MG PO TABS
2.5000 mg | ORAL_TABLET | Freq: Every day | ORAL | Status: DC
  Administered 2014-03-28 – 2014-03-31 (×4): 2.5 mg via ORAL
  Filled 2014-03-28 (×4): qty 1

## 2014-03-28 MED ORDER — ONDANSETRON HCL 4 MG/2ML IJ SOLN: 4.0000 mg | Freq: Four times a day (QID) | INTRAMUSCULAR | Status: DC | PRN

## 2014-03-28 MED ORDER — RAMIPRIL 10 MG PO CAPS
10.0000 mg | ORAL_CAPSULE | Freq: Two times a day (BID) | ORAL | Status: DC
  Administered 2014-03-28 – 2014-03-31 (×7): 10 mg via ORAL
  Filled 2014-03-28 (×9): qty 1

## 2014-03-28 MED ORDER — ATORVASTATIN CALCIUM 20 MG PO TABS
20.0000 mg | ORAL_TABLET | Freq: Every day | ORAL | Status: DC
  Administered 2014-03-28 – 2014-03-30 (×3): 20 mg via ORAL
  Filled 2014-03-28 (×5): qty 1

## 2014-03-28 MED ORDER — HYDRALAZINE HCL 10 MG PO TABS
10.0000 mg | ORAL_TABLET | Freq: Three times a day (TID) | ORAL | Status: DC
  Administered 2014-03-28 – 2014-03-31 (×9): 10 mg via ORAL
  Filled 2014-03-28 (×14): qty 1

## 2014-03-28 MED ORDER — SODIUM CHLORIDE 0.9 % IJ SOLN
3.0000 mL | Freq: Two times a day (BID) | INTRAMUSCULAR | Status: DC
  Administered 2014-03-28 (×2): 3 mL via INTRAVENOUS

## 2014-03-28 NOTE — Progress Notes (Signed)
At 1530 introduced self to pt.  Call bell at reach.  Instructed to call as needed.  Verbalized understanding.  Will continue to monitor.  Karie Kirks, Therapist, sports.

## 2014-03-28 NOTE — Progress Notes (Signed)
ANTICOAGULATION CONSULT NOTE - Initial Consult  Pharmacy Consult for Coumadin Indication: atrial fibrillation  Allergies  Allergen Reactions  . Morphine And Related Itching, Nausea And Vomiting and Rash  . Actos [Pioglitazone Hydrochloride] Swelling  . Januvia [Sitagliptin Phosphate] Other (See Comments)    Pt says this caused elevated liver enzymes and creatinine  . Percocet [Oxycodone-Acetaminophen] Nausea And Vomiting  . Hydrocodone-Acetaminophen Itching and Nausea And Vomiting    Patient Measurements: Height: 5\' 4"  (162.6 cm) Weight: 197 lb 3.2 oz (89.449 kg) (scale B) IBW/kg (Calculated) : 54.7 Heparin Dosing Weight:   Vital Signs: Temp: 98.3 F (36.8 C) (12/14 1421) Temp Source: Oral (12/14 1421) BP: 163/49 mmHg (12/14 1421) Pulse Rate: 40 (12/14 1421)  Labs:  Recent Labs  03/28/14 0750  HGB 10.0*  HCT 30.3*  PLT 239  LABPROT 24.6*  INR 2.19*  CREATININE 1.51*    Estimated Creatinine Clearance: 35.4 mL/min (by C-G formula based on Cr of 1.51).   Medical History: Past Medical History  Diagnosis Date  . HTN (hypertension)   . Hyperlipemia   . Diabetes mellitus     x 10 yrs  . PONV (postoperative nausea and vomiting)   . Atrial fibrillation     CLEARED BY DR Acie Fredrickson 05/27/11  . PAF (paroxysmal atrial fibrillation) 1/14-16/14    Medications:  Prescriptions prior to admission  Medication Sig Dispense Refill Last Dose  . amiodarone (PACERONE) 200 MG tablet Take 1 tablet (200 mg total) by mouth daily. 90 tablet 3 03/27/2014 at Unknown time  . atorvastatin (LIPITOR) 20 MG tablet Take 20 mg by mouth at bedtime.   03/27/2014 at Unknown time  . bromocriptine (PARLODEL) 2.5 MG tablet Take 1 tablet (2.5 mg total) by mouth daily. 180 tablet 1 03/27/2014 at Unknown time  . calcium carbonate (OS-CAL) 600 MG TABS Take 600 mg by mouth daily.    Taking  . calcium carbonate (TUMS - DOSED IN MG ELEMENTAL CALCIUM) 500 MG chewable tablet Chew 1 tablet by mouth daily.    03/27/2014 at Unknown time  . Cholecalciferol (VITAMIN D PO) Take 800 Units by mouth daily.     03/27/2014 at Unknown time  . furosemide (LASIX) 20 MG tablet Take 1 tablet by mouth  daily as needed 90 tablet 3 03/26/2014 at Unknown time  . metoprolol (LOPRESSOR) 50 MG tablet Take 1 tablet (50 mg total) by mouth 2 (two) times daily. 180 tablet 3 03/27/2014 at 2000  . Multiple Vitamin (MULITIVITAMIN WITH MINERALS) TABS Take 1 tablet by mouth daily.   03/27/2014 at Unknown time  . ramipril (ALTACE) 10 MG capsule Take 1 capsule (10 mg total) by mouth 2 (two) times daily. 180 capsule 3 03/27/2014 at Unknown time  . repaglinide (PRANDIN) 2 MG tablet Take 1 tablet (2 mg total) by mouth 3 (three) times daily before meals. 90 tablet 11 03/27/2014 at Unknown time  . warfarin (COUMADIN) 2.5 MG tablet Take as directed by Coumadin clinic (Patient taking differently: Take 1.25-2.5 mg by mouth daily. Take one tablet (2.5mg ) every day except Sunday.  On Sunday, take half of a tablet (1.25mg ).) 90 tablet 1 03/27/2014 at Unknown time  . glucose blood (BAYER CONTOUR NEXT TEST) test strip Use as instructed 100 each 12 Taking  . glucose blood test strip 1 each by Other route as needed for other. Use as instructed   Taking    Assessment: 73yof to continue chronic Coumadin for Afib. INR (2.19) is therapeutic - will continue PTA regimen (2.5mg  daily except  1.25mg  on Sundays) and follow-up AM INR. - H/H low, Plts wnl - No significant bleeding reported  Goal of Therapy:  INR 2-3 Monitor platelets by anticoagulation protocol: Yes   Plan:  1. Continue Coumadin PTA regimen - 2.5mg  daily except 1.25mg  on Sunday 2. Daily INR  Earleen Newport  [phone] 03/28/2014,3:19 PM

## 2014-03-28 NOTE — Progress Notes (Signed)
Echocardiogram 2D Echocardiogram has been performed.  Becky Forbes 03/28/2014, 4:53 PM

## 2014-03-28 NOTE — H&P (Signed)
Physician History and Physical     Patient ID: Becky Forbes MRN: [id number] DOB/AGE: Jun 27, 1940 73 y.o. 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  4/15  EF 40-45%  Mild to moderate MR and severe LAE.  3 days of increasing PND, orthopnea and exertional dyspnea Weight up 6 lbs.  No chest pain.  Feels pulse is lower than usual.  History of SSS and PAF.  On chronic coumadin with Rx INR.  Does not like amiodarone makes her feel bad And dose decreased this year.  History of GI bleed.  Hct stable.  No melena.  Has received iv lasix in ER.  Sats greater than 90 and she is comfortable  Telemetry review shows Sinus bradycardia rates as low as 44 with no AV block.  She has been compliant with her meds including beta blocker.  CRF's  DM, HTN, and Elevated lipids.  CRF with baseline Cr around 1.6   Review of systems complete and found to be negative unless listed above   Past Medical History  Diagnosis Date  . HTN (hypertension)   . Hyperlipemia   . Diabetes mellitus     x 10 yrs  . PONV (postoperative nausea and vomiting)   . Atrial fibrillation     CLEARED BY DR Acie Fredrickson 05/27/11  . PAF (paroxysmal atrial fibrillation) 1/14-16/14    Family History  Problem Relation Age of Onset  . Coronary artery disease Father   . Hypertension Father   . Stroke Father   . Diabetes Father   . Kidney failure Mother   . Hypertension Mother   . Other Brother     SEPSIS  . Diabetes Brother   . Diabetes Sister   . Kidney failure Sister     History   Social History  . Marital Status: Married    Spouse Name: JAMES    Number of Children: 2  . Years of Education: N/A   Occupational History  . 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 Smoking status: Never Smoker   . Smokeless  tobacco: Never Used  . Alcohol Use: No  . Drug Use: No  . Sexual Activity: Not Currently   Other Topics Concern  . Not on file   Social History Narrative    Past Surgical History  Procedure Laterality Date  . Cataract extraction  05/25/07    RIGHT EYE  . Appendectomy    . Carpal tunnel release    . Abdominal hysterectomy    . Lumbar laminectomy    . Foot surgery  2006    MID FOOT RECONSTRUCTION  . Osteotomy  2006    AND FUSION  . Ganglion cyst excision  12/2007    Dr.Gramig  . Total knee arthroplasty  05/02/08, 07/25/08    Dr.Alusio  . Joint replacement      bilateral  . 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 Colonoscopy  01/31/2009    small external/internal hemorrhoids, diverticulosis in sigmoid colon, one small polyp (biopsied). Biospy essentially normal. Dr.Magod  . Colonoscopy  04/30/2012    Procedure: COLONOSCOPY;  Surgeon: Cleotis Nipper, MD;  Location: [facility] ENDOSCOPY;  Service: Endoscopy;  Laterality: N/A;  unprepped, poss flex only      (  Not in a hospital admission)  Physical Exam: Blood pressure 179/58, pulse 38, temperature 97.9 F (36.6 C), temperature source Oral, resp. rate 15, SpO2 97 %.    Affect appropriate Healthy:  appears stated age 39: normal Neck supple with no adenopathy JVP normal no bruits no thyromegaly Lungs clear with no wheezing and good diaphragmatic motion Heart:  S1/S2 no murmur, no rub, gallop or click PMI normal Abdomen: benighn, BS positve, no tenderness, no AAA no bruit.  No HSM or HJR Distal pulses intact with no bruits No edema Neuro non-focal Skin warm and dry No muscular weakness  No current facility-administered medications on file prior to encounter.   Current Outpatient Prescriptions on File Prior to Encounter  Medication Sig Dispense Refill  . amiodarone (PACERONE) 200 MG tablet Take 1 tablet (200 mg total) by mouth daily. 90 tablet 3  . bromocriptine (PARLODEL) 2.5 MG tablet Take 1 tablet (2.5 mg  total) by mouth daily. 180 tablet 1  . calcium carbonate (OS-CAL) 600 MG TABS Take 600 mg by mouth daily.     . Cholecalciferol (VITAMIN D PO) Take 800 Units by mouth daily.      . furosemide (LASIX) 20 MG tablet Take 1 tablet by mouth  daily as needed 90 tablet 3  . metoprolol (LOPRESSOR) 50 MG tablet Take 1 tablet (50 mg total) by mouth 2 (two) times daily. 180 tablet 3  . Multiple Vitamin (MULITIVITAMIN WITH MINERALS) TABS Take 1 tablet by mouth daily.    . ramipril (ALTACE) 10 MG capsule Take 1 capsule (10 mg total) by mouth 2 (two) times daily. 180 capsule 3  . repaglinide (PRANDIN) 2 MG tablet Take 1 tablet (2 mg total) by mouth 3 (three) times daily before meals. 90 tablet 11  . warfarin (COUMADIN) 2.5 MG tablet Take as directed by Coumadin clinic (Patient taking differently: Take 1.25-2.5 mg by mouth daily. Take one tablet (2.5mg ) every day except Sunday.  On Sunday, take half of a tablet (1.25mg ).) 90 tablet 1  . glucose blood (BAYER CONTOUR NEXT TEST) test strip Use as instructed 100 each 12  . glucose blood test strip 1 each by Other route as needed for other. Use as instructed      Labs:   Lab Results  Component Value Date   WBC 8.2 03/28/2014   HGB 10.0* 03/28/2014   HCT 30.3* 03/28/2014   MCV 101.0* 03/28/2014   PLT 239 03/28/2014    Recent Labs Lab 03/28/14 0750  NA 140  K 4.6  CL 104  CO2 23  BUN 31*  CREATININE 1.51*  CALCIUM 9.1  GLUCOSE 182*   Lab Results  Component Value Date   CKTOTAL 72 04/21/2006   TROPONINI <0.30 04/29/2012   TROPONINI <0.30 04/29/2012   TROPONINI <0.30 04/28/2012     Lab Results  Component Value Date   CHOL 166 12/23/2013   CHOL 127 05/27/2011   CHOL 130 09/06/2010   Lab Results  Component Value Date   HDL 36.10* 12/23/2013   HDL 39.40 05/27/2011   HDL 42.60 09/06/2010   Lab Results  Component Value Date   LDLCALC 99 12/23/2013   LDLCALC 67 05/27/2011   LDLCALC 71 09/06/2010   Lab Results  Component Value Date    TRIG 155.0* 12/23/2013   TRIG 102.0 05/27/2011   TRIG 83.0 09/06/2010   Lab Results  Component Value Date   CHOLHDL 5 12/23/2013   CHOLHDL 3 05/27/2011   CHOLHDL 3 09/06/2010   No results  found for: LDLDIRECT     Radiology: Dg Chest Port 1 View  03/28/2014   CLINICAL DATA:  Initial encounter for shortness of breath and wheezing for 1 week.  EXAM: PORTABLE CHEST - 1 VIEW  COMPARISON:  04/28/2012.  FINDINGS: 0739 hrs. Cardiopericardial silhouette is at upper limits of normal for size. There is diffuse interstitial opacity and creased in the interval with Kerley B lines at the lung bases from imaging features suggesting interstitial pulmonary edema. No pneumothorax or substantial pleural effusion. No dense focal airspace consolidation. Imaged bony structures of the thorax are intact. Telemetry leads overlie the chest.  IMPRESSION: Imaging features suggest interstitial edema.   Electronically Signed   By: Misty Stanley M.D.   On: 03/28/2014 08:02    EKG:  SB normal ST segments  ASSESSMENT AND PLAN:   Acute on Chronic Systolic CHF:  Admit. BNP over 4000 and weight up  On exam does not appear that volume overloaded.  IV lasix 40 mg daily Follow BMET Repeat echo to assess degree of MR and EF   Bradycardia:  Hold lopressor continue low dose amiodarone.  Telemetry  K is ok  PAF:  SSS continue anticoagulation.  Normal coumadin dose is 1.5 mg M-Sat and 1.25 mg on Sunday INR Rx today 2.2  DM:  Carb modified diet continue prandin  Signed: Peter Nishan12/14/2015, 10:18 AM

## 2014-03-28 NOTE — Progress Notes (Signed)
Patient's HR sustaining in the 30's; with the lowest noted to be 36.  MD Elias Else notified of brady.  Per Vikki Ports to change HR limit to 33 and notify MD if sustaining lower than 33.  CCMD notified of change. RN will continue to monitor. Shellee Milo, RN

## 2014-03-28 NOTE — ED Notes (Signed)
Dr. Belfi at the bedside.  

## 2014-03-28 NOTE — ED Provider Notes (Signed)
CSN: [id number]     Arrival date & time 03/28/14  0702 History   First MD Initiated Contact with Patient 03/28/14 0719     Chief Complaint  Patient presents with  . Shortness of Breath     (Consider location/radiation/quality/duration/timing/severity/associated sxs/prior Treatment) HPI Comments: Patient with a history of atrial fibrillation, diabetes and hypertension presents with shortness of breath. She states over the last 3-4 days she's had exertional shortness of breath and fatigue with minimal exertion. She states 4 days ago she had an episode of chest discomfort which lasted about 3-4 hours but hasn't had any chest discomfort since that time. [name] noted her heart rate is been low in the 40s. She states it normally runs in the 60s other than when she is in atrial fibrillation. [name] had cough and wheezing for the last 2 days. She denies any fevers or chills. She denies any leg swelling although she states that she's gained about 6 pounds in the last week.  Patient is a 73 y.o. female presenting with shortness of breath.  Shortness of Breath Associated symptoms: chest pain   Associated symptoms: no abdominal pain, no cough, no diaphoresis, no fever, no headaches, no rash and no vomiting     Past Medical History  Diagnosis Date  . HTN (hypertension)   . Hyperlipemia   . Diabetes mellitus     x 10 yrs  . PONV (postoperative nausea and vomiting)   . Atrial fibrillation     CLEARED BY DR Acie Fredrickson 05/27/11  . PAF (paroxysmal atrial fibrillation) 1/14-16/14   Past Surgical History  Procedure Laterality Date  . Cataract extraction  05/25/07    RIGHT EYE  . Appendectomy    . Carpal tunnel release    . Abdominal hysterectomy    . Lumbar laminectomy    . Foot surgery  2006    MID FOOT RECONSTRUCTION  . Osteotomy  2006    AND FUSION  . Ganglion cyst excision  12/2007    Dr.Gramig  . Total knee arthroplasty  05/02/08, 07/25/08    Dr.Alusio  . Joint replacement      bilateral  .  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 Colonoscopy  01/31/2009    small external/internal hemorrhoids, diverticulosis in sigmoid colon, one small polyp (biopsied). Biospy essentially normal. Dr.Magod  . Colonoscopy  04/30/2012    Procedure: COLONOSCOPY;  Surgeon: Cleotis Nipper, MD;  Location: [facility] ENDOSCOPY;  Service: Endoscopy;  Laterality: N/A;  unprepped, poss flex only   Family History  Problem Relation Age of Onset  . Coronary artery disease Father   . Hypertension Father   . Stroke Father   . Diabetes Father   . Kidney failure Mother   . Hypertension Mother   . Other Brother     SEPSIS  . Diabetes Brother   . Diabetes Sister   . Kidney failure Sister    History  Substance Use Topics  . Smoking status: Never Smoker   . Smokeless tobacco: Never Used  . Alcohol Use: No   OB History    No data available     Review of Systems  Constitutional: Positive for fatigue and unexpected weight change. Negative for fever, chills and diaphoresis.  HENT: Negative for congestion, rhinorrhea and sneezing.   Eyes: Negative.   Respiratory: Positive for shortness of breath. Negative for cough and chest tightness.   Cardiovascular: Positive for chest pain. Negative for leg swelling.  Gastrointestinal: Negative for nausea,  vomiting, abdominal pain, diarrhea and blood in stool.  Genitourinary: Negative for frequency, hematuria, flank pain and difficulty urinating.  Musculoskeletal: Negative for back pain and arthralgias.  Skin: Negative for rash.  Neurological: Negative for dizziness, speech difficulty, weakness, numbness and headaches.      Allergies  Morphine and related; Actos; Januvia; Percocet; and Hydrocodone-acetaminophen  Home Medications   Prior to Admission medications   Medication Sig Start Date End Date Taking? Authorizing Provider  amiodarone (PACERONE) 200 MG tablet Take 1 tablet (200 mg total) by mouth daily. 09/22/13  Yes Ramond Dial, MD   atorvastatin (LIPITOR) 20 MG tablet Take 20 mg by mouth at bedtime.   Yes Historical Provider, MD  bromocriptine (PARLODEL) 2.5 MG tablet Take 1 tablet (2.5 mg total) by mouth daily. 01/18/14 01/18/15 Yes Renato Shin, MD  calcium carbonate (OS-CAL) 600 MG TABS Take 600 mg by mouth daily.    Yes Historical Provider, MD  calcium carbonate (TUMS - DOSED IN MG ELEMENTAL CALCIUM) 500 MG chewable tablet Chew 1 tablet by mouth daily.   Yes Historical Provider, MD  Cholecalciferol (VITAMIN D PO) Take 800 Units by mouth daily.     Yes Historical Provider, MD  furosemide (LASIX) 20 MG tablet Take 1 tablet by mouth  daily as needed 02/17/14  Yes Ramond Dial, MD  metoprolol (LOPRESSOR) 50 MG tablet Take 1 tablet (50 mg total) by mouth 2 (two) times daily. 08/27/13  Yes Ramond Dial, MD  Multiple Vitamin (MULITIVITAMIN WITH MINERALS) TABS Take 1 tablet by mouth daily.   Yes Historical Provider, MD  ramipril (ALTACE) 10 MG capsule Take 1 capsule (10 mg total) by mouth 2 (two) times daily. 08/03/13  Yes Ramond Dial, MD  repaglinide (PRANDIN) 2 MG tablet Take 1 tablet (2 mg total) by mouth 3 (three) times daily before meals. 10/13/13  Yes Renato Shin, MD  warfarin (COUMADIN) 2.5 MG tablet Take as directed by Coumadin clinic Patient taking differently: Take 1.25-2.5 mg by mouth daily. Take one tablet (2.5mg ) every day except Sunday.  On Sunday, take half of a tablet (1.25mg ). 09/16/13  Yes Ramond Dial, MD  glucose blood (BAYER CONTOUR NEXT TEST) test strip Use as instructed 06/22/12   Renato Shin, MD  glucose blood test strip 1 each by Other route as needed for other. Use as instructed    Historical Provider, MD   BP 173/136 mmHg  Pulse 42  Temp(Src) 97.9 F (36.6 C) (Oral)  Resp 18  SpO2 96% Physical Exam  Constitutional: She is oriented to person, place, and time. She appears well-developed and well-nourished.  HENT:  Head: Normocephalic and atraumatic.  Mouth/Throat: Oropharynx is  clear and moist.  Eyes: Pupils are equal, round, and reactive to light.  Neck: Normal range of motion. Neck supple.  Cardiovascular: Normal rate, regular rhythm and normal heart sounds.   Pulmonary/Chest: Effort normal and breath sounds normal. No respiratory distress. She has no wheezes. She has no rales. She exhibits no tenderness.  Positive crackles in the he right mid and lower lung  Abdominal: Soft. Bowel sounds are normal. There is no tenderness. There is no rebound and no guarding.  Musculoskeletal: Normal range of motion. She exhibits no edema.  Lymphadenopathy:    She has no cervical adenopathy.  Neurological: She is alert and oriented to person, place, and time.  Skin: Skin is warm and dry. No rash noted.  Psychiatric: She has a normal mood and affect.  ED Course  Procedures (including critical care time) Labs Review Results for orders placed or performed during the hospital encounter of 16/10/96  Basic metabolic panel  Result Value Ref Range   Sodium 140 137 - 147 mEq/L   Potassium 4.6 3.7 - 5.3 mEq/L   Chloride 104 96 - 112 mEq/L   CO2 23 19 - 32 mEq/L   Glucose, Bld 182 (H) 70 - 99 mg/dL   BUN 31 (H) 6 - 23 mg/dL   Creatinine, Ser 1.51 (H) 0.50 - 1.10 mg/dL   Calcium 9.1 8.4 - 10.5 mg/dL   GFR calc non Af Amer 33 (L) >90 mL/min   GFR calc Af Amer 38 (L) >90 mL/min   Anion gap 13 5 - 15  CBC with Differential  Result Value Ref Range   WBC 8.2 4.0 - 10.5 K/uL   RBC 3.00 (L) 3.87 - 5.11 MIL/uL   Hemoglobin 10.0 (L) 12.0 - 15.0 g/dL   HCT 30.3 (L) 36.0 - 46.0 %   MCV 101.0 (H) 78.0 - 100.0 fL   MCH 33.3 26.0 - 34.0 pg   MCHC 33.0 30.0 - 36.0 g/dL   RDW 13.6 11.5 - 15.5 %   Platelets 239 150 - 400 K/uL   Neutrophils Relative % 81 (H) 43 - 77 %   Neutro Abs 6.7 1.7 - 7.7 K/uL   Lymphocytes Relative 10 (L) 12 - 46 %   Lymphs Abs 0.8 0.7 - 4.0 K/uL   Monocytes Relative 5 3 - 12 %   Monocytes Absolute 0.4 0.1 - 1.0 K/uL   Eosinophils Relative 3 0 - 5 %    Eosinophils Absolute 0.2 0.0 - 0.7 K/uL   Basophils Relative 1 0 - 1 %   Basophils Absolute 0.0 0.0 - 0.1 K/uL  Pro b natriuretic peptide (BNP)  Result Value Ref Range   Pro B Natriuretic peptide (BNP) 4959.0 (H) 0 - 125 pg/mL  Protime-INR  Result Value Ref Range   Prothrombin Time 24.6 (H) 11.6 - 15.2 seconds   INR 2.19 (H) 0.00 - 1.49  I-stat troponin, ED  Result Value Ref Range   Troponin i, poc 0.01 0.00 - 0.08 ng/mL   Comment 3           Dg Chest Port 1 View  03/28/2014   CLINICAL DATA:  Initial encounter for shortness of breath and wheezing for 1 week.  EXAM: PORTABLE CHEST - 1 VIEW  COMPARISON:  04/28/2012.  FINDINGS: 0739 hrs. Cardiopericardial silhouette is at upper limits of normal for size. There is diffuse interstitial opacity and creased in the interval with Kerley B lines at the lung bases from imaging features suggesting interstitial pulmonary edema. No pneumothorax or substantial pleural effusion. No dense focal airspace consolidation. Imaged bony structures of the thorax are intact. Telemetry leads overlie the chest.  IMPRESSION: Imaging features suggest interstitial edema.   Electronically Signed   By: Misty Stanley M.D.   On: 03/28/2014 08:02      Imaging Review Dg Chest Port 1 View  03/28/2014   CLINICAL DATA:  Initial encounter for shortness of breath and wheezing for 1 week.  EXAM: PORTABLE CHEST - 1 VIEW  COMPARISON:  04/28/2012.  FINDINGS: 0739 hrs. Cardiopericardial silhouette is at upper limits of normal for size. There is diffuse interstitial opacity and creased in the interval with Kerley B lines at the lung bases from imaging features suggesting interstitial pulmonary edema. No pneumothorax or substantial pleural effusion. No dense focal airspace  consolidation. Imaged bony structures of the thorax are intact. Telemetry leads overlie the chest.  IMPRESSION: Imaging features suggest interstitial edema.   Electronically Signed   By: Misty Stanley M.D.   On:  03/28/2014 08:02     EKG Interpretation   Date/Time:  Monday March 28 2014 07:16:50 EST Ventricular Rate:  41 PR Interval:  198 QRS Duration: 86 QT Interval:  509 QTC Calculation: 420 R Axis:   82 Text Interpretation:  Sinus bradycardia Probable left atrial enlargement  Anterior infarct, old Since last tracing rate slower Confirmed by Zhana Jeangilles   MD, Fletcher Rathbun ([id number]) on 03/28/2014 8:04:01 AM      MDM   Final diagnoses:  Bradycardia  Acute on chronic systolic congestive heart failure    Patient presents with shortness of breath and exertional fatigue. She has evidence of fluid overload. She was given dose of Lasix in the ED. She does have a mild elevation in her creatinine based on her past values. Her EKG shows pretty significant bradycardia but her blood pressures maintained. Her troponin is negative. Cardiology was consulted to see patient.    Malvin Johns, MD 03/28/14 [phone]

## 2014-03-28 NOTE — ED Notes (Signed)
Sob wheezing coughing all night  No asthma no pain

## 2014-03-28 NOTE — Progress Notes (Signed)
Patient admitted thru ED, alert and oriented. Ambulatory. Vitals checked recorded in Epic. Made comfortable in bed and placed on heart monitor. Call bell placed within reach.

## 2014-03-29 ENCOUNTER — Telehealth: Payer: Self-pay | Admitting: Cardiovascular Disease

## 2014-03-29 ENCOUNTER — Encounter (HOSPITAL_COMMUNITY): Payer: Self-pay | Admitting: Internal Medicine

## 2014-03-29 ENCOUNTER — Encounter (HOSPITAL_COMMUNITY)
Admission: EM | Disposition: A | Discharge status: Home / Self Care | Payer: Self-pay | Source: Home / Self Care | Attending: Cardiovascular Disease

## 2014-03-29 DIAGNOSIS — N179 Acute kidney failure, unspecified: Secondary | ICD-10-CM

## 2014-03-29 DIAGNOSIS — I495 Sick sinus syndrome: Secondary | ICD-10-CM

## 2014-03-29 DIAGNOSIS — N189 Chronic kidney disease, unspecified: Secondary | ICD-10-CM

## 2014-03-29 DIAGNOSIS — I483 Typical atrial flutter: Secondary | ICD-10-CM | POA: Diagnosis not present

## 2014-03-29 HISTORY — PX: PERMANENT PACEMAKER INSERTION: SHX5480

## 2014-03-29 LAB — BASIC METABOLIC PANEL
Anion gap: 14 (ref 5–15)
Anion gap: 14 (ref 5–15)
BUN: 27 mg/dL — AB (ref 6–23)
BUN: 28 mg/dL — ABNORMAL HIGH (ref 6–23)
CALCIUM: 9.2 mg/dL (ref 8.4–10.5)
CO2: 29 mEq/L (ref 19–32)
CO2: 28 mEq/L (ref 19–32)
Calcium: 9.4 mg/dL (ref 8.4–10.5)
Chloride: 100 mEq/L (ref 96–112)
Chloride: 99 mEq/L (ref 96–112)
Creatinine, Ser: 1.61 mg/dL — ABNORMAL HIGH (ref 0.50–1.10)
Creatinine, Ser: 1.66 mg/dL — ABNORMAL HIGH (ref 0.50–1.10)
GFR calc Af Amer: 35 mL/min — ABNORMAL LOW (ref >90)
GFR calc non Af Amer: 30 mL/min — ABNORMAL LOW (ref >90)
GFR, EST AFRICAN AMERICAN: 34 mL/min — AB (ref >90)
GFR, EST NON AFRICAN AMERICAN: 29 mL/min — AB (ref >90)
GLUCOSE: 88 mg/dL (ref 70–99)
Glucose, Bld: 138 mg/dL — ABNORMAL HIGH (ref 70–99)
POTASSIUM: 4.1 meq/L (ref 3.7–5.3)
Potassium: 3.7 mEq/L (ref 3.7–5.3)
Sodium: 143 mEq/L (ref 137–147)
Sodium: 141 mEq/L (ref 137–147)

## 2014-03-29 LAB — GLUCOSE, CAPILLARY
GLUCOSE-CAPILLARY: 128 mg/dL — AB (ref 70–99)
Glucose-Capillary: 93 mg/dL (ref 70–99)
Glucose-Capillary: 115 mg/dL — ABNORMAL HIGH (ref 70–99)
Glucose-Capillary: 120 mg/dL — ABNORMAL HIGH (ref 70–99)

## 2014-03-29 LAB — PROTIME-INR
INR: 2.05 — ABNORMAL HIGH (ref 0.00–1.49)
Prothrombin Time: 23.3 seconds — ABNORMAL HIGH (ref 11.6–15.2)

## 2014-03-29 SURGERY — PERMANENT PACEMAKER INSERTION

## 2014-03-29 MED ORDER — HEPARIN (PORCINE) IN NACL 2-0.9 UNIT/ML-% IJ SOLN
INTRAMUSCULAR | Status: AC
  Filled 2014-03-29: qty 500

## 2014-03-29 MED ORDER — SODIUM CHLORIDE 0.9 % IV SOLN: INTRAVENOUS | Status: DC

## 2014-03-29 MED ORDER — ONDANSETRON HCL 4 MG/2ML IJ SOLN: 4.0000 mg | Freq: Four times a day (QID) | INTRAMUSCULAR | Status: DC | PRN

## 2014-03-29 MED ORDER — SODIUM CHLORIDE 0.9 % IV SOLN: 250.0000 mL | INTRAVENOUS | Status: DC | PRN

## 2014-03-29 MED ORDER — AMIODARONE HCL 100 MG PO TABS
100.0000 mg | ORAL_TABLET | Freq: Every day | ORAL | Status: DC
  Filled 2014-03-29: qty 1

## 2014-03-29 MED ORDER — CEFAZOLIN SODIUM 1-5 GM-% IV SOLN
1.0000 g | Freq: Four times a day (QID) | INTRAVENOUS | Status: AC
  Administered 2014-03-29 – 2014-03-30 (×3): 1 g via INTRAVENOUS
  Filled 2014-03-29 (×3): qty 50

## 2014-03-29 MED ORDER — ACETAMINOPHEN 325 MG PO TABS
325.0000 mg | ORAL_TABLET | ORAL | Status: DC | PRN
  Administered 2014-03-29 – 2014-03-30 (×2): 650 mg via ORAL
  Filled 2014-03-29 (×3): qty 2

## 2014-03-29 MED ORDER — SODIUM CHLORIDE 0.9 % IJ SOLN
3.0000 mL | Freq: Two times a day (BID) | INTRAMUSCULAR | Status: DC
  Administered 2014-03-29 – 2014-03-31 (×4): 3 mL via INTRAVENOUS

## 2014-03-29 MED ORDER — SODIUM CHLORIDE 0.9 % IJ SOLN: 3.0000 mL | INTRAMUSCULAR | Status: DC | PRN

## 2014-03-29 MED ORDER — LIDOCAINE HCL (PF) 1 % IJ SOLN
INTRAMUSCULAR | Status: AC
  Filled 2014-03-29: qty 30

## 2014-03-29 MED ORDER — CEFAZOLIN SODIUM-DEXTROSE 2-3 GM-% IV SOLR
2.0000 g | INTRAVENOUS | Status: AC
  Filled 2014-03-29: qty 50

## 2014-03-29 MED ORDER — CEFAZOLIN SODIUM-DEXTROSE 2-3 GM-% IV SOLR
INTRAVENOUS | Status: AC
  Filled 2014-03-29: qty 50

## 2014-03-29 MED ORDER — SODIUM CHLORIDE 0.9 % IR SOLN
Freq: Once | Status: DC
  Filled 2014-03-29: qty 2

## 2014-03-29 MED ORDER — TRAMADOL HCL 50 MG PO TABS
50.0000 mg | ORAL_TABLET | Freq: Once | ORAL | Status: AC
  Administered 2014-03-30: 50 mg via ORAL
  Filled 2014-03-29: qty 1

## 2014-03-29 MED ORDER — CHLORHEXIDINE GLUCONATE 4 % EX LIQD
60.0000 mL | Freq: Once | CUTANEOUS | Status: DC
  Filled 2014-03-29: qty 60

## 2014-03-29 MED ORDER — SODIUM CHLORIDE 0.9 % IR SOLN
80.0000 mg | Status: AC
  Filled 2014-03-29: qty 2

## 2014-03-29 NOTE — Care Management Note (Addendum)
    Page 1 of 1   03/31/2014     6:34:21 PM CARE MANAGEMENT NOTE 03/31/2014  Patient:  Becky Forbes, Becky Forbes   Account Number:  [id number]  Date Initiated:  03/29/2014  Documentation initiated by:  Mariann Laster  Subjective/Objective Assessment:   CHF     Action/Plan:   CM to follow for disposition needs   Anticipated DC Date:  04/01/2014   Anticipated DC Plan:  HOME/SELF CARE         Choice offered to / List presented to:             Status of service:  Completed, signed off Medicare Important Message given?  YES (If response is "NO", the following Medicare IM given date fields will be blank) Date Medicare IM given:  03/31/2014 Medicare IM given by:  Yaritzel Stange Date Additional Medicare IM given:   Additional Medicare IM given by:    Discharge Disposition:  HOME/SELF CARE  Per UR Regulation:  Reviewed for med. necessity/level of care/duration of stay  If discussed at [place] of Stay Meetings, dates discussed:    Comments:  Briony Parveen RN, BSN, MSHL, CCM  Nurse - Case Manager,  (Unit [facility] [phone]  03/31/2014 Pacemaker 03/29/2014 Home / Self care

## 2014-03-29 NOTE — Progress Notes (Signed)
Patient arrived to floor around 1800 from pacemaker. Will continue to monitor patient.

## 2014-03-29 NOTE — Progress Notes (Signed)
Orthopedic Tech Progress Note Patient Details:  Becky Forbes May 28, 1940 [id number] Patient already has arm sling Patient ID: Becky Forbes, female   DOB: February 19, 1941, 73 y.o.   MRN: [id number]   Braulio Bosch 03/29/2014, 8:05 PM

## 2014-03-29 NOTE — Progress Notes (Signed)
ANTICOAGULATION CONSULT NOTE - Follow Up Consult  Pharmacy Consult for Coumadin Indication: atrial fibrillation  Allergies  Allergen Reactions  . Morphine And Related Itching, Nausea And Vomiting and Rash  . Actos [Pioglitazone Hydrochloride] Swelling  . Januvia [Sitagliptin Phosphate] Other (See Comments)    Pt says this caused elevated liver enzymes and creatinine  . Percocet [Oxycodone-Acetaminophen] Nausea And Vomiting  . Hydrocodone-Acetaminophen Itching and Nausea And Vomiting    Patient Measurements: Height: 5\' 4"  (162.6 cm) Weight: 193 lb 14.4 oz (87.952 kg) (scale c) IBW/kg (Calculated) : 54.7 Heparin Dosing Weight:   Vital Signs: Temp: 97 F (36.1 C) (12/15 0416) Temp Source: Oral (12/15 0416) BP: 138/45 mmHg (12/15 0416) Pulse Rate: 38 (12/15 0416)  Labs:  Recent Labs  03/28/14 0750 03/29/14 0635  HGB 10.0*  --   HCT 30.3*  --   PLT 239  --   LABPROT 24.6* 23.3*  INR 2.19* 2.05*  CREATININE 1.51* 1.61*    Estimated Creatinine Clearance: 32.9 mL/min (by C-G formula based on Cr of 1.61).  Assessment: 73yof continuing Coumadin for Afib. INR (2.05) remains therapeutic - will continue PTA regimen (2.5mg  daily except 1.25mg  on Sundays) and follow-up AM INR.  - No new CBC - No significant bleeding reported  Goal of Therapy:  INR 2-3   Plan:  1. Continue Coumadin PTA regimen - 2.5mg  daily except 1.25mg  on Sunday 2. Daily INR  Earleen Newport  [phone] 03/29/2014,9:59 AM

## 2014-03-29 NOTE — Interval H&P Note (Signed)
History and Physical Interval Note:  03/29/2014 3:32 PM  Becky Forbes  has presented today for surgery, with the diagnosis of bradicardia  The various methods of treatment have been discussed with the patient and family. After consideration of risks, benefits and other options for treatment, the patient has consented to  Procedure(s): PERMANENT PACEMAKER INSERTION (N/A) as a surgical intervention .  The patient's history has been reviewed, patient examined, no change in status, stable for surgery.  I have reviewed the patient's chart and labs.  Questions were answered to the patient's satisfaction.     [name]

## 2014-03-29 NOTE — Progress Notes (Signed)
Subjective: Feels better, no SOB at rest but with walking to BR + DOE., no chest pain, HR down to 28 but mostly in the 30s at night  Objective: Vital signs in last 24 hours: Temp:  [97 F (36.1 C)-98.3 F (36.8 C)] 97 F (36.1 C) (12/15 0416) Pulse Rate:  [37-42] 37 (12/15 1025) Resp:  [15-20] 18 (12/15 0416) BP: (114-163)/(44-78) 123/51 mmHg (12/15 1025) SpO2:  [92 %-98 %] 94 % (12/15 1025) Weight:  [193 lb 14.4 oz (87.952 kg)-197 lb 3.2 oz (89.449 kg)] 193 lb 14.4 oz (87.952 kg) (12/15 0416) Weight change:  Last BM Date: 03/27/14 Intake/Output from previous day: -[place]  Wt 193 today down 5 lbs. 12/14 0701 - 12/15 0700 In: 3 [I.V.:3] Out: 1650 [Urine:1650] Intake/Output this shift: Total I/O In: 240 [P.O.:240] Out: 200 [Urine:200]  PE: General:Pleasant affect, NAD Skin:Warm and dry, brisk capillary refill HEENT:normocephalic, sclera clear, mucus membranes moist Heart:S1S2 slow RRR without murmur, gallup, rub or click Lungs:clear without rales, rhonchi, or wheezes ACZ:YSAY, non tender, + BS, do not palpate liver spleen or masses Ext:no lower ext edema, 2+ pedal pulses, 2+ radial pulses Neuro:alert and oriented X 3, MAE, follows commands, + facial symmetry  tele:  SB mostly in the 30s Lab Results:  Recent Labs  03/28/14 0750  WBC 8.2  HGB 10.0*  HCT 30.3*  PLT 239   BMET  Recent Labs  03/29/14 0635 03/29/14 1050  NA 143 141  K 3.7 4.1  CL 100 99  CO2 29 28  GLUCOSE 88 138*  BUN 27* 28*  CREATININE 1.61* 1.66*  CALCIUM 9.2 9.4   No results for input(s): TROPONINI in the last 72 hours.  Invalid input(s): CK, MB  Lab Results  Component Value Date   CHOL 166 12/23/2013   HDL 36.10* 12/23/2013   LDLCALC 99 12/23/2013   TRIG 155.0* 12/23/2013   CHOLHDL 5 12/23/2013   Lab Results  Component Value Date   HGBA1C 7.6* 01/18/2014     Lab Results  Component Value Date   TSH 2.79 12/23/2013      Studies/Results: Dg Chest Port 1  View  03/28/2014   CLINICAL DATA:  Initial encounter for shortness of breath and wheezing for 1 week.  EXAM: PORTABLE CHEST - 1 VIEW  COMPARISON:  04/28/2012.  FINDINGS: 0739 hrs. Cardiopericardial silhouette is at upper limits of normal for size. There is diffuse interstitial opacity and creased in the interval with Kerley B lines at the lung bases from imaging features suggesting interstitial pulmonary edema. No pneumothorax or substantial pleural effusion. No dense focal airspace consolidation. Imaged bony structures of the thorax are intact. Telemetry leads overlie the chest.  IMPRESSION: Imaging features suggest interstitial edema.   Electronically Signed   By: Misty Stanley M.D.   On: 03/28/2014 08:02   2D Echo:  03/28/14 Left ventricle: Posterior basal hypokinesis. The cavity size was mildly dilated. Wall thickness was normal. Systolic function was normal. The estimated ejection fraction was in the range of 50% to 55%. Doppler parameters are consistent with elevated ventricular end-diastolic filling pressure. - Mitral valve: Calcified annulus. Mildly thickened leaflets . There was mild regurgitation. - Left atrium: The atrium was severely dilated. - Right atrium: The atrium was moderately dilated. - Atrial septum: No defect or patent foramen ovale was identified. - Impressions: PA pressure elevated base on TR velocity. Impressions: - PA pressure elevated base on TR velocity.   Medications: I have reviewed the  patient's current medications. Scheduled Meds: . amiodarone  200 mg Oral Daily  . atorvastatin  20 mg Oral QHS  . bromocriptine  2.5 mg Oral Daily  . furosemide  40 mg Intravenous Daily  . hydrALAZINE  10 mg Oral 3 times per day  . ramipril  10 mg Oral BID  . repaglinide  2 mg Oral TID AC  . sodium chloride  3 mL Intravenous Q12H  . [START ON 04/03/2014] warfarin  1.25 mg Oral Once per day on Sun  . warfarin  2.5 mg Oral Once per day on Mon Tue Wed Thu Fri Sat  .  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 4/15 EF 40-45% Mild to moderate MR and severe LAE. 3 days of increasing PND, orthopnea and exertional dyspnea Weight up 6 lbs. No chest pain. Feels pulse is lower than usual. History of SSS and PAF. On chronic coumadin with Rx INR. Does not like amiodarone makes her feel bad and dose decreased this year. History of GI bleed. Hct stable. No melena. Has received iv lasix in ER with improvement. Sats greater than 90 and she is comfortable Telemetry review shows Sinus bradycardia rates as low as 44 with no AV block. She has been compliant with her meds including beta blocker. CRF's DM, HTN, and Elevated lipids. CRF with baseline Cr around 1.6   Acute on Chronic Systolic CHF: Admit. BNP over 4000 and weight up On exam does not appear that volume overloaded. IV lasix 40 mg daily Follow BMET- sounds good today, ? Change to po lasix  Repeat echo to assess degree of MR and EF was done - EF 55% and mild MR, elevated PA pressures  Bradycardia: Hold lopressor continue low dose amiodarone. Telemetry- with bradycardia still 39 this AM-during the night at times in the upper 20s.  K is ok-- at one point in a fib Dr. Acie Fredrickson did discuss pacemaker with the pt.  I briefly mentioned today, but off BB only one day.  ? EP consult  PAF: SSS continue anticoagulation. Normal coumadin dose is 1.5 mg M-Sat and 1.25 mg on Sunday INR Rx today 2.2  DM: Carb modified diet continue prandin glucose stable 89-128  CKD  Cr up some at 1.66  Anticoagulation:  INR 2.05, on coumadin   LOS: 1 day   Time spent with pt. :15  minutes. [facility] R  Nurse Practitioner Certified Pager [phone] or after 5pm and on weekends call [phone] 03/29/2014, 12:51 PM   I saw  examined the patient this afternoon after miss Wiscon, NP-C. I reviewed the chart and all available data including telemetry. Admitted with acute on chronic systolic and diastolic heart failure likely exacerbated by bradycardia. Much less volume overloaded on exam after one night of diuresis. Would likely continue IV Lasix for today and would like to change to by mouth tomorrow.  With profound bradycardia requiring amiodarone for rate control, we have backed off on the beta blocker holding amiodarone, but we will consult EP for possible pacemaker consideration. At this point, I'm sure that her bradycardia is exacerbating her heart failure symptoms. She is on ACE inhibitor for as well as  PRN hydralazine for afterload reduction  She is currently on warfarin. Therapeutic INR. Will defer to EP is to goal INR for pacemaker placement.  On statin  HARDING,DAVID W,  M.D., M.S. Interventional Cardiologist   Pager # [phone]

## 2014-03-29 NOTE — Consult Note (Signed)
ELECTROPHYSIOLOGY CONSULT NOTE    Primary Care Physician: Unice Cobble, MD Referring Physician:  Dr Johnsie Cancel  Admit Date: 03/28/2014  Reason for consultation: sinus bradycardia  Becky Forbes is a 72 y.o. female with a h/o atrial fibrillation, atrial flutter, and HTN.  She has had chronic difficulty with sinus bradycardia as well.  Upon review of Dr Elmarie Shiley notes, she has had recurrent atrial arrhythmias.  Her EF previously had declined but did better with rate control.  She has severe LA enlargement and has required amiodarone to maintain sinus rhythm.   She reports progressive symptoms of dizziness, fatigue, and SOB with exertion.  She feels that this is due to "slow heart rate".  She reports that her AF is presently controlled with amiodarone.  When in afib, she feels "terrible".  She reports symptoms of tachypalpitations and fatigue with her AF. She presents with worsening SOB.  She is found to have profound sinus bradycardia.  Her metoprolol has been discontinued and allowed to wash out without any improvement in her heart rate.  She remains "washed out".  Today, she denies symptoms of chest pain,  lower extremity edema,   presyncope, syncope, or neurologic sequela. The patient is tolerating medications without difficulties and is otherwise without complaint today.   Past Medical History  Diagnosis Date  . HTN (hypertension)   . Hyperlipemia   . Diabetes mellitus     x 10 yrs  . PONV (postoperative nausea and vomiting)   . PAF (paroxysmal atrial fibrillation) 1/14-16/14   Past Surgical History  Procedure Laterality Date  . Cataract extraction  05/25/07    RIGHT EYE  . Appendectomy    . Carpal tunnel release    . Abdominal hysterectomy    . Lumbar laminectomy    . Foot surgery  2006    MID FOOT RECONSTRUCTION  . Osteotomy  2006    AND FUSION  . Ganglion cyst excision  12/2007    Dr.Gramig  . Total knee arthroplasty  05/02/08, 07/25/08    Dr.Alusio  . Joint replacement     bilateral  . 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 Colonoscopy  01/31/2009    small external/internal hemorrhoids, diverticulosis in sigmoid colon, one small polyp (biopsied). Biospy essentially normal. Dr.Magod  . Colonoscopy  04/30/2012    Procedure: COLONOSCOPY;  Surgeon: Cleotis Nipper, MD;  Location: [facility] ENDOSCOPY;  Service: Endoscopy;  Laterality: N/A;  unprepped, poss flex only    . amiodarone  200 mg Oral Daily  . atorvastatin  20 mg Oral QHS  . bromocriptine  2.5 mg Oral Daily  . furosemide  40 mg Intravenous Daily  . hydrALAZINE  10 mg Oral 3 times per day  . ramipril  10 mg Oral BID  . repaglinide  2 mg Oral TID AC  . sodium chloride  3 mL Intravenous Q12H  . [START ON 04/03/2014] warfarin  1.25 mg Oral Once per day on Sun  . warfarin  2.5 mg Oral Once per day on Mon Tue Wed Thu Fri Sat  . Warfarin - Pharmacist Dosing Inpatient   Does not apply q1800      Allergies  Allergen Reactions  . Morphine And Related Itching, Nausea And Vomiting and Rash  . Actos [Pioglitazone Hydrochloride] Swelling  . Januvia [Sitagliptin Phosphate] Other (See Comments)    Pt says this caused elevated liver enzymes and creatinine  . Percocet [Oxycodone-Acetaminophen] Nausea And Vomiting  . Hydrocodone-Acetaminophen Itching and Nausea And  Vomiting    History   Social History  . Marital Status: Married    Spouse Name: Evann Koelzer    Number of Children: 2  . Years of Education: N/A   Occupational History  . 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 Smoking status: Never Smoker   . Smokeless tobacco: Never Used  . Alcohol Use: No  . Drug Use: No  . Sexual Activity: Not Currently   Other Topics Concern  . Not on file   Social History Narrative    Family History  Problem Relation Age of Onset  . Coronary artery disease Father   . Hypertension Father   . Stroke Father   . Diabetes Father   . Kidney failure Mother   .  Hypertension Mother   . Other Brother     SEPSIS  . Diabetes Brother   . Diabetes Sister   . Kidney failure Sister     ROS- All systems are reviewed and negative except as per the HPI above  Physical Exam: Telemetry: Filed Vitals:   03/28/14 1756 03/28/14 2100 03/29/14 0416 03/29/14 1025  BP: 114/78 120/44 138/45 123/51  Pulse: 42 39 38 37  Temp:  98.2 F (36.8 C) 97 F (36.1 C)   TempSrc:  Oral Oral   Resp:  20 18   Height:      Weight:   193 lb 14.4 oz (87.952 kg)   SpO2:  98% 95% 94%    GEN- The patient is well appearing, alert and oriented x 3 today.   Head- normocephalic, atraumatic Eyes-  Sclera clear, conjunctiva pink Ears- hearing intact Oropharynx- clear Neck- supple, no JVP Lymph- no cervical lymphadenopathy Lungs- Clear to ausculation bilaterally, normal work of breathing Heart- bradycardic regular rhythm, no murmurs, rubs or gallops, PMI not laterally displaced GI- soft, NT, ND, + BS Extremities- no clubbing, cyanosis, or edema MS- no significant deformity or atrophy Skin- no rash or lesion Psych- euthymic mood, full affect Neuro- strength and sensation are intact  EKG reveals sinus rhythm I have reviewed multiple EKGs which reveal sinus bradycardia, atrial flutter (typical appearing) and coarse AF.  Echo- EF 55%, severe LA enlargement, mild MR  Labs:   Lab Results  Component Value Date   WBC 8.2 03/28/2014   HGB 10.0* 03/28/2014   HCT 30.3* 03/28/2014   MCV 101.0* 03/28/2014   PLT 239 03/28/2014    Recent Labs Lab 03/29/14 1050  NA 141  K 4.1  CL 99  CO2 28  BUN 28*  CREATININE 1.66*  CALCIUM 9.4  GLUCOSE 138*   Lab Results  Component Value Date   CKTOTAL 72 04/21/2006   TROPONINI <0.30 04/29/2012    Lab Results  Component Value Date   CHOL 166 12/23/2013   CHOL 127 05/27/2011   CHOL 130 09/06/2010   Lab Results  Component Value Date   HDL 36.10* 12/23/2013   HDL 39.40 05/27/2011   HDL 42.60 09/06/2010   Lab Results    Component Value Date   LDLCALC 99 12/23/2013   LDLCALC 67 05/27/2011   LDLCALC 71 09/06/2010   Lab Results  Component Value Date   TRIG 155.0* 12/23/2013   TRIG 102.0 05/27/2011   TRIG 83.0 09/06/2010   Lab Results  Component Value Date   CHOLHDL 5 12/23/2013   CHOLHDL 3 05/27/2011   CHOLHDL 3 09/06/2010    ASSESSMENT AND PLAN:   1. Sick  sinus syndrome The patient has had chronic difficulty with sinus bradycardia.  She remains bradycardic despite metoprolol washout (last dose 03/27/14).  She requires AV nodal agents and also amiodarone long term to maintain sinus rhythm.  Per Dr Elmarie Shiley notes, she has had reduced EF in the setting of slow heart rates previously.   I would therefore recommend pacemaker implantation at this time.  Risks, benefits, alternatives to pacemaker implantation were discussed in detail with the patient today. The patient understands that the risks include but are not limited to bleeding, infection, pneumothorax, perforation, tamponade, vascular damage, renal failure, MI, stroke, death,  and lead dislodgement and wishes to proceed.   2. Paroxysmal atrial fibrillation and atrial flutter Continue amiodarone Resume AV nodal agents post PPM chads2vasc score is at least 4.  Continue coumadin long term.  3. HTN Stable No change required today  4. acute on chronic renal failure Avoid nephrotoxins as able Hopefully this will improve with improved heart rates   [name], MD 03/29/2014  3:19 PM

## 2014-03-29 NOTE — Progress Notes (Signed)
Patient continues to be asymptomatic with sinus brady rhythm. Will continue to monitor patient to end of shift.

## 2014-03-29 NOTE — H&P (View-Only) (Signed)
ELECTROPHYSIOLOGY CONSULT NOTE    Primary Care Physician: Unice Cobble, MD Referring Physician:  Dr Johnsie Cancel  Admit Date: 03/28/2014  Reason for consultation: sinus bradycardia  Becky Forbes is a 73 y.o. female with a h/o atrial fibrillation, atrial flutter, and HTN.  She has had chronic difficulty with sinus bradycardia as well.  Upon review of Dr Elmarie Shiley notes, she has had recurrent atrial arrhythmias.  Her EF previously had declined but did better with rate control.  She has severe LA enlargement and has required amiodarone to maintain sinus rhythm.   She reports progressive symptoms of dizziness, fatigue, and SOB with exertion.  She feels that this is due to "slow heart rate".  She reports that her AF is presently controlled with amiodarone.  When in afib, she feels "terrible".  She reports symptoms of tachypalpitations and fatigue with her AF. She presents with worsening SOB.  She is found to have profound sinus bradycardia.  Her metoprolol has been discontinued and allowed to wash out without any improvement in her heart rate.  She remains "washed out".  Today, she denies symptoms of chest pain,  lower extremity edema,   presyncope, syncope, or neurologic sequela. The patient is tolerating medications without difficulties and is otherwise without complaint today.   Past Medical History  Diagnosis Date  . HTN (hypertension)   . Hyperlipemia   . Diabetes mellitus     x 10 yrs  . PONV (postoperative nausea and vomiting)   . PAF (paroxysmal atrial fibrillation) 1/14-16/14   Past Surgical History  Procedure Laterality Date  . Cataract extraction  05/25/07    RIGHT EYE  . Appendectomy    . Carpal tunnel release    . Abdominal hysterectomy    . Lumbar laminectomy    . Foot surgery  2006    MID FOOT RECONSTRUCTION  . Osteotomy  2006    AND FUSION  . Ganglion cyst excision  12/2007    Dr.Gramig  . Total knee arthroplasty  05/02/08, 07/25/08    Dr.Alusio  . Joint replacement     bilateral  . 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 Colonoscopy  01/31/2009    small external/internal hemorrhoids, diverticulosis in sigmoid colon, one small polyp (biopsied). Biospy essentially normal. Dr.Magod  . Colonoscopy  04/30/2012    Procedure: COLONOSCOPY;  Surgeon: Cleotis Nipper, MD;  Location: [facility] ENDOSCOPY;  Service: Endoscopy;  Laterality: N/A;  unprepped, poss flex only    . amiodarone  200 mg Oral Daily  . atorvastatin  20 mg Oral QHS  . bromocriptine  2.5 mg Oral Daily  . furosemide  40 mg Intravenous Daily  . hydrALAZINE  10 mg Oral 3 times per day  . ramipril  10 mg Oral BID  . repaglinide  2 mg Oral TID AC  . sodium chloride  3 mL Intravenous Q12H  . [START ON 04/03/2014] warfarin  1.25 mg Oral Once per day on Sun  . warfarin  2.5 mg Oral Once per day on Mon Tue Wed Thu Fri Sat  . Warfarin - Pharmacist Dosing Inpatient   Does not apply q1800      Allergies  Allergen Reactions  . Morphine And Related Itching, Nausea And Vomiting and Rash  . Actos [Pioglitazone Hydrochloride] Swelling  . Januvia [Sitagliptin Phosphate] Other (See Comments)    Pt says this caused elevated liver enzymes and creatinine  . Percocet [Oxycodone-Acetaminophen] Nausea And Vomiting  . Hydrocodone-Acetaminophen Itching and Nausea And  Vomiting    History   Social History  . Marital Status: Married    Spouse Name: Sargent Mankey    Number of Children: 2  . Years of Education: N/A   Occupational History  . 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 Smoking status: Never Smoker   . Smokeless tobacco: Never Used  . Alcohol Use: No  . Drug Use: No  . Sexual Activity: Not Currently   Other Topics Concern  . Not on file   Social History Narrative    Family History  Problem Relation Age of Onset  . Coronary artery disease Father   . Hypertension Father   . Stroke Father   . Diabetes Father   . Kidney failure Mother   .  Hypertension Mother   . Other Brother     SEPSIS  . Diabetes Brother   . Diabetes Sister   . Kidney failure Sister     ROS- All systems are reviewed and negative except as per the HPI above  Physical Exam: Telemetry: Filed Vitals:   03/28/14 1756 03/28/14 2100 03/29/14 0416 03/29/14 1025  BP: 114/78 120/44 138/45 123/51  Pulse: 42 39 38 37  Temp:  98.2 F (36.8 C) 97 F (36.1 C)   TempSrc:  Oral Oral   Resp:  20 18   Height:      Weight:   193 lb 14.4 oz (87.952 kg)   SpO2:  98% 95% 94%    GEN- The patient is well appearing, alert and oriented x 3 today.   Head- normocephalic, atraumatic Eyes-  Sclera clear, conjunctiva pink Ears- hearing intact Oropharynx- clear Neck- supple, no JVP Lymph- no cervical lymphadenopathy Lungs- Clear to ausculation bilaterally, normal work of breathing Heart- bradycardic regular rhythm, no murmurs, rubs or gallops, PMI not laterally displaced GI- soft, NT, ND, + BS Extremities- no clubbing, cyanosis, or edema MS- no significant deformity or atrophy Skin- no rash or lesion Psych- euthymic mood, full affect Neuro- strength and sensation are intact  EKG reveals sinus rhythm I have reviewed multiple EKGs which reveal sinus bradycardia, atrial flutter (typical appearing) and coarse AF.  Echo- EF 55%, severe LA enlargement, mild MR  Labs:   Lab Results  Component Value Date   WBC 8.2 03/28/2014   HGB 10.0* 03/28/2014   HCT 30.3* 03/28/2014   MCV 101.0* 03/28/2014   PLT 239 03/28/2014    Recent Labs Lab 03/29/14 1050  NA 141  K 4.1  CL 99  CO2 28  BUN 28*  CREATININE 1.66*  CALCIUM 9.4  GLUCOSE 138*   Lab Results  Component Value Date   CKTOTAL 72 04/21/2006   TROPONINI <0.30 04/29/2012    Lab Results  Component Value Date   CHOL 166 12/23/2013   CHOL 127 05/27/2011   CHOL 130 09/06/2010   Lab Results  Component Value Date   HDL 36.10* 12/23/2013   HDL 39.40 05/27/2011   HDL 42.60 09/06/2010   Lab Results    Component Value Date   LDLCALC 99 12/23/2013   LDLCALC 67 05/27/2011   LDLCALC 71 09/06/2010   Lab Results  Component Value Date   TRIG 155.0* 12/23/2013   TRIG 102.0 05/27/2011   TRIG 83.0 09/06/2010   Lab Results  Component Value Date   CHOLHDL 5 12/23/2013   CHOLHDL 3 05/27/2011   CHOLHDL 3 09/06/2010    ASSESSMENT AND PLAN:   1. Sick  sinus syndrome The patient has had chronic difficulty with sinus bradycardia.  She remains bradycardic despite metoprolol washout (last dose 03/27/14).  She requires AV nodal agents and also amiodarone long term to maintain sinus rhythm.  Per Dr Elmarie Shiley notes, she has had reduced EF in the setting of slow heart rates previously.   I would therefore recommend pacemaker implantation at this time.  Risks, benefits, alternatives to pacemaker implantation were discussed in detail with the patient today. The patient understands that the risks include but are not limited to bleeding, infection, pneumothorax, perforation, tamponade, vascular damage, renal failure, MI, stroke, death,  and lead dislodgement and wishes to proceed.   2. Paroxysmal atrial fibrillation and atrial flutter Continue amiodarone Resume AV nodal agents post PPM chads2vasc score is at least 4.  Continue coumadin long term.  3. HTN Stable No change required today  4. acute on chronic renal failure Avoid nephrotoxins as able Hopefully this will improve with improved heart rates   [name], MD 03/29/2014  3:19 PM

## 2014-03-29 NOTE — Telephone Encounter (Signed)
Error

## 2014-03-29 NOTE — Progress Notes (Signed)
Patient's heart rate is beginning to sustain in the low 30's from 33-35 and patient is asymptomatic. Heart rate low while sleeping and awake. Text paged Dr.Whitock. Orders to continue to monitor patient and day doctors will assess patient's heart rate when making rounds. Will continue to monitor patient as orders.

## 2014-03-29 NOTE — Op Note (Addendum)
SURGEON:  [name], MD     PREPROCEDURE DIAGNOSIS:  Symptomatic sick sinus syndrome, paroxysmal atrial fibrillation    POSTPROCEDURE DIAGNOSIS:  Symptomatic sick sinus syndrome, paroxysmal atrial fibrillation     PROCEDURES:   1.   Pacemaker implantation.     INTRODUCTION:  Becky Forbes is a 73 y.o. female with a history of sinus bradycardia who presents today for pacemaker implantation.  The patient reports intermittent episodes of dizziness, fatigue, and SOB over the past few months.  No reversible causes have been identified.  She has atrial fibrillation for which she is treated with amiodarone chronically.  The patient therefore presents today for pacemaker implantation.     DESCRIPTION OF PROCEDURE:  Informed written consent was obtained, and  the patient was brought to the electrophysiology lab in a fasting state.  The patient required no sedation for the procedure today.  The patients left chest was prepped and draped in the usual sterile fashion by the EP lab staff. The skin overlying the left deltopectoral region was infiltrated with lidocaine for local analgesia.  A 4-cm incision was made over the left deltopectoral region.  A left subcutaneous pacemaker pocket was fashioned using a combination of sharp and blunt dissection. Electrocautery was required to assure hemostasis.    RA/RV Lead Placement: The left axillary vein was therefore cannulated.  No contrast was required for this endeavor.  Through the left axillary vein, a [facility] model [id number] (serial number  [id number]) right atrial lead and a [employer] model [phone] (serial number  [id number]) right ventricular lead were advanced with fluoroscopic visualization into the right atrial appendage and right ventricular apex positions respectively.  Initial atrial lead P- waves measured 1.4 mV with impedance of 576 ohms and a threshold of 2.0 V at 0.4 msec. (She was noted to have a very large RA.  Extensive mapping was  performed however a better threshold could not be found).  Right ventricular lead R-waves measured 10 mV with an impedance of 630 ohms and a threshold of 0.4 V at 0.4 msec.  Both leads were secured to the pectoralis fascia using #2-0 silk over the suture sleeves.   Device Placement:  The leads were then connected to a [place] DR  model [id number] (serial number  [id number] ) pacemaker.  The pocket was irrigated with copious gentamicin solution.  The pacemaker was then placed into the pocket.  The pocket was then closed in 2 layers with 2.0 Vicryl suture for the subcutaneous and subcuticular layers.  Steri-  Strips and a sterile dressing were then applied. EBL<73ml.  There were no early apparent complications.     CONCLUSIONS:   1. Successful implantation of a [employer] Jude Medical Assurity DR  dual-chamber pacemaker for symptomatic sinus bradycardia  2. No early apparent complications.  3. No contrast or sedation were used for this procedure today           [name], MD 03/29/2014 5:23 PM

## 2014-03-29 NOTE — Progress Notes (Signed)
UR completed Morrissa Shein K. Paden Senger, RN, BSN, MSHL, CCM  03/29/2014 3:59 PM

## 2014-03-29 NOTE — Plan of Care (Signed)
Problem: Phase I Progression Outcomes Goal: EF % per last Echo/documented,Core Reminder form on chart Outcome: Completed/Met Date Met:  03/29/14 EF performed on 03/28/2014. EF% result is  50-55%

## 2014-03-30 ENCOUNTER — Encounter (HOSPITAL_COMMUNITY): Payer: Self-pay | Admitting: Internal Medicine

## 2014-03-30 ENCOUNTER — Inpatient Hospital Stay (HOSPITAL_COMMUNITY): Payer: Medicare Other

## 2014-03-30 ENCOUNTER — Other Ambulatory Visit: Payer: Self-pay

## 2014-03-30 DIAGNOSIS — E876 Hypokalemia: Secondary | ICD-10-CM

## 2014-03-30 LAB — GLUCOSE, CAPILLARY
GLUCOSE-CAPILLARY: 90 mg/dL (ref 70–99)
GLUCOSE-CAPILLARY: 238 mg/dL — AB (ref 70–99)
Glucose-Capillary: 121 mg/dL — ABNORMAL HIGH (ref 70–99)
Glucose-Capillary: 177 mg/dL — ABNORMAL HIGH (ref 70–99)

## 2014-03-30 LAB — PROTIME-INR
INR: 1.86 — ABNORMAL HIGH (ref 0.00–1.49)
Prothrombin Time: 21.6 seconds — ABNORMAL HIGH (ref 11.6–15.2)

## 2014-03-30 LAB — BASIC METABOLIC PANEL
ANION GAP: 16 — AB (ref 5–15)
BUN: 26 mg/dL — ABNORMAL HIGH (ref 6–23)
CALCIUM: 9.3 mg/dL (ref 8.4–10.5)
CO2: 27 mEq/L (ref 19–32)
CREATININE: 1.65 mg/dL — AB (ref 0.50–1.10)
Chloride: 100 mEq/L (ref 96–112)
GFR calc non Af Amer: 30 mL/min — ABNORMAL LOW (ref >90)
GFR, EST AFRICAN AMERICAN: 34 mL/min — AB (ref >90)
Glucose, Bld: 60 mg/dL — ABNORMAL LOW (ref 70–99)
Potassium: 3.2 mEq/L — ABNORMAL LOW (ref 3.7–5.3)
SODIUM: 143 meq/L (ref 137–147)

## 2014-03-30 MED ORDER — WARFARIN SODIUM 3 MG PO TABS
3.5000 mg | ORAL_TABLET | Freq: Once | ORAL | Status: AC
  Administered 2014-03-30: 3.5 mg via ORAL
  Filled 2014-03-30: qty 1

## 2014-03-30 MED ORDER — WARFARIN SODIUM 2.5 MG PO TABS
2.5000 mg | ORAL_TABLET | ORAL | Status: DC
Start: 2014-03-31 — End: 2014-03-31
  Filled 2014-03-30: qty 1

## 2014-03-30 MED ORDER — AMIODARONE HCL 200 MG PO TABS
200.0000 mg | ORAL_TABLET | Freq: Every day | ORAL | Status: DC
  Administered 2014-03-30 – 2014-03-31 (×2): 200 mg via ORAL
  Filled 2014-03-30 (×2): qty 1

## 2014-03-30 MED ORDER — POTASSIUM CHLORIDE CRYS ER 20 MEQ PO TBCR
40.0000 meq | EXTENDED_RELEASE_TABLET | Freq: Once | ORAL | Status: AC
  Administered 2014-03-30: 40 meq via ORAL
  Filled 2014-03-30: qty 2

## 2014-03-30 MED ORDER — FUROSEMIDE 10 MG/ML IJ SOLN
40.0000 mg | Freq: Once | INTRAMUSCULAR | Status: AC
  Administered 2014-03-30: 40 mg via INTRAVENOUS
  Filled 2014-03-30: qty 4

## 2014-03-30 NOTE — Progress Notes (Signed)
ANTICOAGULATION CONSULT NOTE - Follow Up Consult  Pharmacy Consult for Coumadin Indication: atrial fibrillation  Allergies  Allergen Reactions  . Morphine And Related Itching, Nausea And Vomiting and Rash  . Actos [Pioglitazone Hydrochloride] Swelling  . Januvia [Sitagliptin Phosphate] Other (See Comments)    Pt says this caused elevated liver enzymes and creatinine  . Percocet [Oxycodone-Acetaminophen] Nausea And Vomiting  . Hydrocodone-Acetaminophen Itching and Nausea And Vomiting    Patient Measurements: Height: 5\' 4"  (162.6 cm) Weight: 192 lb 0.3 oz (87.1 kg) IBW/kg (Calculated) : 54.7  Vital Signs: Temp: 97.6 F (36.4 C) (12/16 0556) Temp Source: Oral (12/16 0556) BP: 131/70 mmHg (12/16 0556) Pulse Rate: 73 (12/16 0556)  Labs:  Recent Labs  03/28/14 0750 03/29/14 0635 03/29/14 1050 03/30/14 0416  HGB 10.0*  --   --   --   HCT 30.3*  --   --   --   PLT 239  --   --   --   LABPROT 24.6* 23.3*  --  21.6*  INR 2.19* 2.05*  --  1.86*  CREATININE 1.51* 1.61* 1.66* 1.65*    Estimated Creatinine Clearance: 32 mL/min (by C-G formula based on Cr of 1.65).  Assessment: 73yof continuing Coumadin for Afib. INR is SUBtherapeutic at 1.86 this morning. Will give a little extra today.  PTA regimen (2.5mg  daily except 1.25mg  on Sundays)   - No new CBC - No significant bleeding reported  Goal of Therapy:  INR 2-3   Plan:  1. Coumadin 3.5 mg PO tonight then resume 2.5mg  daily except 1.25mg  on Sunday 2. Daily INR  [facility], Pharm.D., BCPS Clinical Pharmacist Pager: [phone] 03/30/2014 10:09 AM

## 2014-03-30 NOTE — Progress Notes (Signed)
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 Day 1 s/p PPM insertion.   Subjective: Only complaint is mild nausea w/o vomiting. She denies dyspnea and CP.   Objective: Vital signs in last 24 hours: Temp:  [97.6 F (36.4 C)-98.5 F (36.9 C)] 97.6 F (36.4 C) (12/16 0556) Pulse Rate:  [37-73] 73 (12/16 0556) Resp:  [17-18] 17 (12/16 0556) BP: (98-170)/(49-127) 131/70 mmHg (12/16 0556) SpO2:  [94 %-98 %] 98 % (12/16 0556) Weight:  [192 lb 0.3 oz (87.1 kg)] 192 lb 0.3 oz (87.1 kg) (12/16 0556) Last BM Date: 03/27/14  Intake/Output from previous day: 12/15 0701 - 12/16 0700 In: 1280 [P.O.:960; I.V.:220; IV Piggyback:100] Out: 1850 [Urine:1850] Intake/Output this shift:    Medications Current Facility-Administered Medications  Medication Dose Route Frequency Provider Last Rate Last Dose  . 0.9 %  sodium chloride infusion   Intravenous Continuous Nelda Severe Kristen Bushway, MD      . 0.9 %  sodium chloride infusion  250 mL Intravenous PRN Coralyn Mark, MD      . acetaminophen (TYLENOL) tablet 325-650 mg  325-650 mg Oral Q4H PRN Coralyn Mark, MD   650 mg at 03/30/14 0646  . amiodarone (PACERONE) tablet 100 mg  100 mg Oral Daily Coralyn Mark, MD      . atorvastatin (LIPITOR) tablet 20 mg  20 mg Oral QHS Josue Hector, MD   20 mg at 03/29/14 2141  . bromocriptine (PARLODEL) tablet 2.5 mg  2.5 mg Oral Daily Josue Hector, MD   2.5 mg at 03/29/14 1050  . ceFAZolin (ANCEF) IVPB 1 g/50 mL premix  1 g Intravenous Q6H Coralyn Mark, MD   1 g at 03/30/14 0357  . ceFAZolin (ANCEF) IVPB 2 g/50 mL premix  2 g Intravenous On Call Coralyn Mark, MD      . chlorhexidine (HIBICLENS) 4 % liquid 4 application  60 mL Topical Once Coralyn Mark, MD   4 application at 50/27/74 1821  . gentamicin (GARAMYCIN) 80 mg in sodium chloride irrigation 0.9 % 500 mL irrigation  80  mg Irrigation On Call Coralyn Mark, MD   Stopped at 03/29/14 1821  . hydrALAZINE (APRESOLINE) tablet 10 mg  10 mg Oral 3 times per day Josue Hector, MD   10 mg at 03/30/14 0644  . ondansetron (ZOFRAN) injection 4 mg  4 mg Intravenous Q6H PRN Coralyn Mark, MD      . ramipril (ALTACE) capsule 10 mg  10 mg Oral BID Josue Hector, MD   10 mg at 03/29/14 2140  . repaglinide (PRANDIN) tablet 2 mg  2 mg Oral TID AC Josue Hector, MD   2 mg at 03/29/14 1953  . sodium chloride 0.9 % injection 3 mL  3 mL Intravenous Q12H Coralyn Mark, MD   3 mL at 03/29/14 2141  . sodium chloride 0.9 % injection 3 mL  3 mL Intravenous PRN Coralyn Mark, MD      . Derrill Memo ON 04/03/2014] warfarin (COUMADIN) tablet 1.25 mg  1.25 mg Oral Once per day on [place], [facility]      . warfarin (COUMADIN) tablet 2.5 mg  2.5 mg Oral Once per day on Mon Tue Wed Thu Fri Sat [place], RPH   2.5 mg at 03/29/14 1910  . Warfarin -  Pharmacist Dosing Inpatient   Does not apply [place], RPH   0  at 03/28/14 1800    PE: General appearance: alert, cooperative and no distress Neck: no carotid bruit and no JVD Lungs: clear to auscultation bilaterally Heart: regular rate and rhythm, S1, S2 normal, no murmur, click, rub or gallop Extremities: no LEE Pulses: 2+ and symmetric Skin: warm and dry Neurologic: Grossly normal  Lab Results:   Recent Labs  03/28/14 0750  WBC 8.2  HGB 10.0*  HCT 30.3*  PLT 239   BMET  Recent Labs  03/29/14 0635 03/29/14 1050 03/30/14 0416  NA 143 141 143  K 3.7 4.1 3.2*  CL 100 99 100  CO2 29 28 27   GLUCOSE 88 138* 60*  BUN 27* 28* 26*  CREATININE 1.61* 1.66* 1.65*  CALCIUM 9.2 9.4 9.3   PT/INR  Recent Labs  03/28/14 0750 03/29/14 0635 03/30/14 0416  LABPROT 24.6* 23.3* 21.6*  INR 2.19* 2.05* 1.86*   Studies/Results:  Post-operative CXR:  FINDINGS: The lungs are mildly hyperinflated and clear. There is no pleural effusion or  pneumothorax. The cardiac silhouette is normal in size. The pulmonary vascularity is not engorged. The permanent pacemaker is in appropriate position radiographically. The bony thorax exhibits no acute abnormalities. There are degenerative disc changes of the mid thoracic spine.  IMPRESSION: There is no acute postprocedure complication following pacemaker placement. There is no evidence of CHF.   Assessment/Plan    Active Problems:   CHF (congestive heart failure), NYHA class II   Sick sinus syndrome   Paroxysmal atrial fibrillation   Typical atrial flutter   Acute on chronic renal insufficiency  1. A/C Combined Systolic + Diastolic CHF: no further dyspnea. Euvolemic on physical exam. Will transition to PO Lasix today. Continue ACE and hydralazine for afterload reduction. Can restart BB now that patient has pacemaker.   2. SSS: Day 1 s/p St. Jude PPM insertion. No post-operative complications. Post-op CXR demonstrates proper lead placement and no evidence of pneumothorax. Device interrogation reveals normal functioning.   3. PAF: HR controlled in the 70s. Continue Amiodarone. Can resume BB. Continue warfarin for a/c.   4. Chronic Anticoagulation: on warfarin for PAF. INR sub therapeutic at 1.86. Day 1 s/p PM insertion. Will defer restart of warfarin to EP.   5. Hypokalemic: K is 3.2. Will give 40 mEq of supplemental K.  6. DM: AM lab glucose reported as 60. Follow=up finger-stick capillary glucose normal at 90.     LOS: 2 days    Brittainy M. Rosita Fire, PA-C 03/30/2014 8:06 AM  I have seen, examined the patient, and reviewed the above assessment and plan.  Changes to above are made where necessary.  Doing well s/p PPM implant.  CXR reveals stable leads and no ptx.  Device interrogatio is normal.  She is back in AF but rate controlled.  Will continue amiodarone 200mg  daily.  Resume metoprolol at 25mg  BID for rate control. Resume home lasix regimen.  Will ambulate and discharge  later today if she does well.  Given renal failure and diastolic dysfunction, she would probably benefit from transition of care visit in 1 week post discharge. Routine wound are and device follow-up.  Co Sign: [name], MD 03/30/2014 9:52 AM

## 2014-03-30 NOTE — Progress Notes (Signed)
   Patient reexamined. She ambulated today and had DOE. Per Dr. Rayann Heman, will keep another night and will give IV Lasix (no lasix was given this am). Will reassess tomorrow and will hopefully be ready for discharge.   Forbes, Becky 03/30/2014

## 2014-03-30 NOTE — Progress Notes (Signed)
Earlier in shift patient states pain level between a 3 and 4 on scale of 1-10 from surgical procedure of having pacemaker inserted. Patient has had no relief. Paged Dr. Venetia Maxon. Orders given for one time dose of tramadol per order. Will administer medication and continue to monitor patient to end of shift.

## 2014-03-31 ENCOUNTER — Encounter (HOSPITAL_COMMUNITY): Payer: Self-pay | Admitting: *Deleted

## 2014-03-31 ENCOUNTER — Telehealth: Payer: Self-pay | Admitting: Physician Assistant

## 2014-03-31 DIAGNOSIS — I5043 Acute on chronic combined systolic (congestive) and diastolic (congestive) heart failure: Secondary | ICD-10-CM | POA: Diagnosis present

## 2014-03-31 DIAGNOSIS — R001 Bradycardia, unspecified: Secondary | ICD-10-CM | POA: Diagnosis present

## 2014-03-31 DIAGNOSIS — E118 Type 2 diabetes mellitus with unspecified complications: Secondary | ICD-10-CM

## 2014-03-31 DIAGNOSIS — Z950 Presence of cardiac pacemaker: Secondary | ICD-10-CM | POA: Diagnosis not present

## 2014-03-31 LAB — BASIC METABOLIC PANEL
ANION GAP: 14 (ref 5–15)
BUN: 28 mg/dL — ABNORMAL HIGH (ref 6–23)
CALCIUM: 9.4 mg/dL (ref 8.4–10.5)
CO2: 27 meq/L (ref 19–32)
CREATININE: 1.54 mg/dL — AB (ref 0.50–1.10)
Chloride: 98 mEq/L (ref 96–112)
GFR calc Af Amer: 37 mL/min — ABNORMAL LOW (ref >90)
GFR, EST NON AFRICAN AMERICAN: 32 mL/min — AB (ref >90)
Glucose, Bld: 100 mg/dL — ABNORMAL HIGH (ref 70–99)
Potassium: 4.3 mEq/L (ref 3.7–5.3)
SODIUM: 139 meq/L (ref 137–147)

## 2014-03-31 LAB — PROTIME-INR
INR: 2.06 — ABNORMAL HIGH (ref 0.00–1.49)
Prothrombin Time: 23.4 seconds — ABNORMAL HIGH (ref 11.6–15.2)

## 2014-03-31 LAB — GLUCOSE, CAPILLARY
GLUCOSE-CAPILLARY: 99 mg/dL (ref 70–99)
Glucose-Capillary: 139 mg/dL — ABNORMAL HIGH (ref 70–99)

## 2014-03-31 MED ORDER — RAMIPRIL 5 MG PO CAPS
5.0000 mg | ORAL_CAPSULE | Freq: Two times a day (BID) | ORAL | Status: DC
  Filled 2014-03-31: qty 1

## 2014-03-31 MED ORDER — METOPROLOL TARTRATE 25 MG PO TABS
25.0000 mg | ORAL_TABLET | Freq: Two times a day (BID) | ORAL | Status: DC
  Administered 2014-03-31: 25 mg via ORAL
  Filled 2014-03-31 (×2): qty 1

## 2014-03-31 MED ORDER — POTASSIUM CHLORIDE ER 10 MEQ PO TBCR: 10.0000 meq | EXTENDED_RELEASE_TABLET | Freq: Every day | ORAL | Status: DC

## 2014-03-31 MED ORDER — METOPROLOL TARTRATE 12.5 MG HALF TABLET
12.5000 mg | ORAL_TABLET | Freq: Once | ORAL | Status: AC
  Administered 2014-03-31: 12.5 mg via ORAL
  Filled 2014-03-31: qty 1

## 2014-03-31 MED ORDER — FUROSEMIDE 20 MG PO TABS
20.0000 mg | ORAL_TABLET | Freq: Every day | ORAL | Status: DC
  Administered 2014-03-31: 20 mg via ORAL
  Filled 2014-03-31: qty 1

## 2014-03-31 MED ORDER — RAMIPRIL 5 MG PO CAPS: 5.0000 mg | ORAL_CAPSULE | Freq: Two times a day (BID) | ORAL | Status: DC

## 2014-03-31 MED ORDER — METOPROLOL TARTRATE 50 MG PO TABS
50.0000 mg | ORAL_TABLET | Freq: Two times a day (BID) | ORAL | Status: DC
  Filled 2014-03-31: qty 1

## 2014-03-31 MED ORDER — FUROSEMIDE 20 MG PO TABS: 20.0000 mg | ORAL_TABLET | Freq: Every day | ORAL | Status: DC

## 2014-03-31 MED ORDER — METOPROLOL TARTRATE 25 MG PO TABS
37.5000 mg | ORAL_TABLET | Freq: Two times a day (BID) | ORAL | Status: DC
  Filled 2014-03-31: qty 1

## 2014-03-31 NOTE — Progress Notes (Signed)
ANTICOAGULATION CONSULT NOTE - Follow Up Consult  Pharmacy Consult for Coumadin Indication: atrial fibrillation  Allergies  Allergen Reactions  . Morphine And Related Itching, Nausea And Vomiting and Rash  . Actos [Pioglitazone Hydrochloride] Swelling  . Januvia [Sitagliptin Phosphate] Other (See Comments)    Pt says this caused elevated liver enzymes and creatinine  . Percocet [Oxycodone-Acetaminophen] Nausea And Vomiting  . Hydrocodone-Acetaminophen Itching and Nausea And Vomiting    Patient Measurements: Height: 5\' 4"  (162.6 cm) Weight: 192 lb 14.4 oz (87.5 kg) IBW/kg (Calculated) : 54.7  Vital Signs: Temp: 98 F (36.7 C) (12/17 0608) Temp Source: Oral (12/17 3474) BP: 126/72 mmHg (12/17 0701) Pulse Rate: 74 (12/17 0608)  Labs:  Recent Labs  03/29/14 0635 03/29/14 1050 03/30/14 0416 03/31/14 0616  LABPROT 23.3*  --  21.6* 23.4*  INR 2.05*  --  1.86* 2.06*  CREATININE 1.61* 1.66* 1.65* 1.54*    Estimated Creatinine Clearance: 34.3 mL/min (by C-G formula based on Cr of 1.54).  Assessment: 73yof continuing Coumadin for Afib. INR is therapeutic at 2.06 this morning.  PTA regimen (2.5mg  daily except 1.25mg  on Sundays)   - No new CBC - No significant bleeding reported  Goal of Therapy:  INR 2-3   Plan:  1. Coumadin 2.5mg  PO daily except 1.25mg  on Sunday 2. Daily INR  [facility], Pharm.D., BCPS Clinical Pharmacist Pager: [phone] 03/31/2014 10:25 AM

## 2014-03-31 NOTE — Discharge Summary (Signed)
Physician Discharge Summary       Patient ID: Becky Forbes MRN: [id number] DOB/AGE: 1940-06-16 73 y.o.  Admit date: 03/28/2014 Discharge date: 03/31/2014   Primary Cardiologist::Dr. Nahser EP: Dr. Rayann Heman   Discharge Diagnoses:  Principal Problem:   Acute on chronic combined systolic and diastolic HF (heart failure), NYHA class 2 Active Problems:   Symptomatic sinus bradycardia   Diabetes   CHF (congestive heart failure), NYHA class II   Sick sinus syndrome   Paroxysmal atrial fibrillation   Typical atrial flutter   Acute on chronic renal insufficiency   S/P cardiac pacemaker procedure, St. Jude device   Discharged Condition: good  Procedures: 03/29/14 PPM st. Jude  Medical Assurity DR dual-chamber pacemaker for symptomatic sinus bradycardia by Dr. 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 4/15 EF 40-45% Mild to moderate MR and severe LAE. 3 days of increasing PND, orthopnea and exertional dyspnea Weight up 6 lbs. No chest pain and came to ER on 03/28/14. Feels pulse is lower than usual. History of SSS and PAF. On chronic coumadin with Rx INR. Does not like amiodarone makes her feel bad but she is continuing to take.  And dose decreased this year. History of GI bleed. Hct stable. No melena. Has received iv lasix in ER. Sats greater than 90 and she is comfortable Telemetry review shows Sinus bradycardia rates as low as 44 with no AV block. She has been compliant with her meds including beta blocker. CRF's DM, HTN, and Elevated lipids. CRF with baseline Cr around 1.6.  Pt admitted and BB stopped. Amiodarone continued to prevent PAF.  HR remained low and pt evaluated by Dr. Rayann Heman.  Despite stopping her BB she continued to feel washed out and with her hx of SSS pacemaker was placed.  Done without complications.  Post procedure XRay was without pneumothorax.  She was doing well but did go back into a fib.  With activity her HR  would climb to 130.  Her BB which had ben restarted was increased back to 50 mg BID, her BP was soft so her altace was decreased to 5 mg BID.    With higher dose of BB she was ambulating without complications.  She was seen and evaluated by Dr. Roni Bread and found stable for discharge.    Per Dr. Rayann Heman she should be TCM due to ongoing a fib.  She will be seen 12/24 in flex clinic. She will need INR evaluated at the same time.  She will be 9 days out from pacemaker so PA will evaluate her PPM site as well.  Here some mild bruising.  She has been negative 3L here, discharge wt 192.14 lbs, down from pk of 197.  I discharged on her home lasix but to take daily instead of prn. She will need follow up with Dr. Rayann Heman in 3 months for device check but if a fib remains may need to be seen prior to that time.     Consults: cardiology-EP  Significant Diagnostic Studies:  BMET    Component Value Date/Time   NA 139 03/31/2014 0616   K 4.3 03/31/2014 0616   CL 98 03/31/2014 0616   CO2 27 03/31/2014 0616   GLUCOSE 100* 03/31/2014 0616   GLUCOSE 120* 04/21/2006 1234   BUN 28* 03/31/2014 0616   CREATININE 1.54* 03/31/2014 0616   CALCIUM 9.4 03/31/2014 0616   GFRNONAA 32* 03/31/2014 0616   GFRAA 37* 03/31/2014 3086  CBC    Component Value Date/Time   WBC 8.2 03/28/2014 0750   RBC 3.00* 03/28/2014 0750   HGB 10.0* 03/28/2014 0750   HCT 30.3* 03/28/2014 0750   PLT 239 03/28/2014 0750   MCV 101.0* 03/28/2014 0750   MCH 33.3 03/28/2014 0750   MCHC 33.0 03/28/2014 0750   RDW 13.6 03/28/2014 0750   LYMPHSABS 0.8 03/28/2014 0750   MONOABS 0.4 03/28/2014 0750   EOSABS 0.2 03/28/2014 0750   BASOSABS 0.0 03/28/2014 0750   BNP    Component Value Date/Time   PROBNP 4959.0* 03/28/2014 0750   Troponin 0.001  INR at discharge 2.06  CHEST 2 VIEW COMPARISON: Portable chest x-ray of March 28, 2014 FINDINGS: The lungs are mildly hyperinflated and clear. There is no pleural effusion or  pneumothorax. The cardiac silhouette is normal in size. The pulmonary vascularity is not engorged. The permanent pacemaker is in appropriate position radiographically. The bony thorax exhibits no acute abnormalities. There are degenerative disc changes of the mid thoracic spine. IMPRESSION: There is no acute postprocedure complication following pacemaker placement. There is no evidence of CHF.    2D Echo: Study Conclusions  - Left ventricle: Posterior basal hypokinesis. The cavity size was mildly dilated. Wall thickness was normal. Systolic function was normal. The estimated ejection fraction was in the range of 50% to 55%. Doppler parameters are consistent with elevated ventricular end-diastolic filling pressure. - Mitral valve: Calcified annulus. Mildly thickened leaflets . There was mild regurgitation. - Left atrium: The atrium was severely dilated. - Right atrium: The atrium was moderately dilated. - Atrial septum: No defect or patent foramen ovale was identified. - Impressions: PA pressure elevated base on TR velocity.  Impressions:  - PA pressure elevated base on TR velocity.  EKG: Electronic ventricular pacemaker Atypical flutter waves are present with variable A-V block Compared to previous tracing the flutter waves are new   Discharge Exam: Blood pressure 126/72, pulse 74, temperature 98 F (36.7 C), temperature source Oral, resp. rate 18, height 5\' 4"  (1.626 m), weight 192 lb 14.4 oz (87.5 kg), SpO2 97 %.   Disposition: 01-Home or Self Care     Medication List    TAKE these medications        amiodarone 200 MG tablet  Commonly known as:  PACERONE  Take 1 tablet (200 mg total) by mouth daily.     atorvastatin 20 MG tablet  Commonly known as:  LIPITOR  Take 20 mg by mouth at bedtime.     bromocriptine 2.5 MG tablet  Commonly known as:  PARLODEL  Take 1 tablet (2.5 mg total) by mouth daily.     calcium carbonate 500 MG chewable tablet    Commonly known as:  TUMS - dosed in mg elemental calcium  Chew 1 tablet by mouth daily.     calcium carbonate 600 MG Tabs tablet  Commonly known as:  OS-CAL  Take 600 mg by mouth daily.     furosemide 20 MG tablet  Commonly known as:  LASIX  Take 1 tablet (20 mg total) by mouth daily.     glucose blood test strip  Commonly known as:  BAYER CONTOUR NEXT TEST  Use as instructed     glucose blood test strip  1 each by Other route as needed for other. Use as instructed     metoprolol 50 MG tablet  Commonly known as:  LOPRESSOR  Take 1 tablet (50 mg total) by mouth 2 (two) times daily.  multivitamin with minerals Tabs tablet  Take 1 tablet by mouth daily.     ramipril 5 MG capsule  Commonly known as:  ALTACE  Take 1 capsule (5 mg total) by mouth 2 (two) times daily.     repaglinide 2 MG tablet  Commonly known as:  PRANDIN  Take 1 tablet (2 mg total) by mouth 3 (three) times daily before meals.     VITAMIN D PO  Take 800 Units by mouth daily.     warfarin 2.5 MG tablet  Commonly known as:  COUMADIN  Take as directed by Coumadin clinic       Follow-up Information    Follow up with Nahser, Wonda Cheng, MD On 04/07/2014.   Specialty:  Cardiology   Why:  at 8:30 AM.    Contact information:   [address] [place] [address] [phone]       Follow up with [facility] On 04/07/2014.   Why:  at the same time.    Contact information:   [address] [place] [address]        Discharge Instructions: Heart healthy diabetic diet, low salt  Weigh daily Call [phone] if weight climbs more than 3 pounds in a day or 5 pounds in a week. No salt to very little salt in your diet.  No more than 2000 mg in a day. Call if increased shortness of breath or increased swelling.  Pacemaker instructions. Signed: Isaiah Serge Nurse Practitioner-Certified  Medical Group: [facility] 03/31/2014, 2:47 PM  Time spent  on discharge : >40 minutes.     Patient seen and evaluated on the day of discharge. A discharge summary. Admitted with signs and symptoms of diastolic heart layer and symptomatic bradycardia. He was diuresed and had a pacemaker placed for symptomatic bradycardia this allows [place] to titrate up her rate control for atrial fibrillation. Her affect the reduction was reduced to allow for increased beta blocker dose. She is inadequately diuresed and ready to be discharged home on stable dose of oral Lasix.  Leonie Man, M.D., M.S. Interventional Cardiologist   Pager # [phone]

## 2014-03-31 NOTE — Telephone Encounter (Signed)
New message      TCM appt on 04-07-14 with Dayna per Mickel Baas.

## 2014-03-31 NOTE — Discharge Instructions (Signed)
Heart healthy diabetic diet, low salt  Weigh daily Call [phone] if weight climbs more than 3 pounds in a day or 5 pounds in a week. No salt to very little salt in your diet.  No more than 2000 mg in a day. Call if increased shortness of breath or increased swelling.     Supplemental Discharge Instructions for  Pacemaker Patients  Activity No heavy lifting or vigorous activity with your left arm for 6 to 8 weeks.  Do not raise your left arm above your head for one week.  Gradually raise your affected arm as drawn below.           __12/16/15                  04/01/14                    04/03/14                    04/06/14  NO DRIVING for 1 week    ; you may begin driving on 31/49/70.  WOUND CARE - Keep the wound area clean and dry.  Do not get this area wet for one week. No showers for one week; you may shower on 04/05/14    . - The tape/steri-strips on your wound will fall off; do not pull them off.  No bandage is needed on the site.  DO  NOT apply any creams, oils, or ointments to the wound area. - If you notice any drainage or discharge from the wound, any swelling or bruising at the site, or you develop a fever > 101? F after you are discharged home, call the office at once.  Special Instructions - You are still able to use cellular telephones; use the ear opposite the side where you have your pacemaker/defibrillator.  Avoid carrying your cellular phone near your device. - When traveling through airports, show security personnel your identification card to avoid being screened in the metal detectors.  Ask the security personnel to use the hand wand. - Avoid arc welding equipment, MRI testing (magnetic resonance imaging), TENS units (transcutaneous nerve stimulators).  Call the office for questions about other devices. - Avoid electrical appliances that are in poor condition or are not properly grounded. - Microwave ovens are safe to be near or to operate.

## 2014-03-31 NOTE — Progress Notes (Signed)
Pt HR up to 120-130 when ambulating in room to bathroom. Pt with no complaints of shortness of breath or chest pain. Only complain mild nausea. Pt HR back down to 80's when resting in bed, V-paced on demand. Pt with history of HR increasing to 120s with ambulation today and last night as well per monitor. Pt now resting comfortably/asleep in bed. Will continue to monitor. Ronnette Hila, RN

## 2014-03-31 NOTE — Progress Notes (Signed)
Subjective: DOE with her a fib and increased HR  Objective: Vital signs in last 24 hours: Temp:  [97.3 F (36.3 C)-98 F (36.7 C)] 98 F (36.7 C) (12/17 6222) Pulse Rate:  [70-126] 74 (12/17 0608) Resp:  [18-19] 18 (12/17 0608) BP: (107-138)/(52-76) 126/72 mmHg (12/17 0701) SpO2:  [97 %] 97 % (12/17 0608) Weight:  [192 lb 14.4 oz (87.5 kg)] 192 lb 14.4 oz (87.5 kg) (12/17 9798) Weight change: 14.1 oz (0.4 kg) Last BM Date: 03/30/14 Intake/Output from previous day: -1255 12/16 0701 - 12/17 0700 In: 870 [P.O.:870] Out: 2125 [Urine:2125] Intake/Output this shift:    PE: General:Pleasant affect, NAD Skin:Warm and dry, brisk capillary refill HEENT:normocephalic, sclera clear, mucus membranes moist Heart:S1S2 RRR without murmur, gallup, rub or click; pacer site with steristrips no hematoma some bruising no drainage. Lungs:clear without rales, rhonchi, or wheezes XQJ:JHER, non tender, + BS, do not palpate liver spleen or masses Ext:no lower ext edema, 2+ pedal pulses, 2+ radial pulses Neuro:alert and oriented, MAE, follows commands, + facial symmetry   tele:  Continues in atrial fib, with HR up to 120-130 at times- causing SOB.    Lab Results: No results for input(s): WBC, HGB, HCT, PLT in the last 72 hours. BMET  Recent Labs  03/30/14 0416 03/31/14 0616  NA 143 139  K 3.2* 4.3  CL 100 98  CO2 27 27  GLUCOSE 60* 100*  BUN 26* 28*  CREATININE 1.65* 1.54*  CALCIUM 9.3 9.4   No results for input(s): TROPONINI in the last 72 hours.  Invalid input(s): CK, MB  Lab Results  Component Value Date   CHOL 166 12/23/2013   HDL 36.10* 12/23/2013   LDLCALC 99 12/23/2013   TRIG 155.0* 12/23/2013   CHOLHDL 5 12/23/2013   Lab Results  Component Value Date   HGBA1C 7.6* 01/18/2014     Lab Results  Component Value Date   TSH 2.79 12/23/2013     Studies/Results: Dg Chest 2 View  03/30/2014   CLINICAL DATA:  Status post pacemaker placement, chest  soreness following surgery  EXAM: CHEST  2 VIEW  COMPARISON:  Portable chest x-ray of March 28, 2014  FINDINGS: The lungs are mildly hyperinflated and clear. There is no pleural effusion or pneumothorax. The cardiac silhouette is normal in size. The pulmonary vascularity is not engorged. The permanent pacemaker is in appropriate position radiographically. The bony thorax exhibits no acute abnormalities. There are degenerative disc changes of the mid thoracic spine.  IMPRESSION: There is no acute postprocedure complication following pacemaker placement. There is no evidence of CHF.   Electronically Signed   By: Taden Witter  [place]   On: 03/30/2014 07:47    Medications: I have reviewed the patient's current medications. Scheduled Meds: . amiodarone  200 mg Oral Daily  . atorvastatin  20 mg Oral QHS  . bromocriptine  2.5 mg Oral Daily  . chlorhexidine  60 mL Topical Once  . hydrALAZINE  10 mg Oral 3 times per day  . metoprolol tartrate  25 mg Oral BID  . ramipril  10 mg Oral BID  . repaglinide  2 mg Oral TID AC  . sodium chloride  3 mL Intravenous Q12H  . [START ON 04/03/2014] warfarin  1.25 mg Oral Once per day on Sun  . warfarin  2.5 mg Oral Once per day on Mon Tue Wed Thu Fri Sat  . Warfarin - Pharmacist Dosing Inpatient   Does not apply [phone]  Continuous Infusions: . 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 4/15 EF 40-45% Mild to moderate MR and severe LAE. 3 days of increasing PND, orthopnea and exertional dyspnea Weight up 6 lbs. No chest pain. Feels pulse is lower than usual. History of SSS and PAF. On chronic coumadin with Rx INR. Does not like amiodarone makes her feel bad and dose decreased this year. History of GI bleed. Hct stable. No melena. Has received iv lasix in ER with improvement. Sats greater than 90 and she is comfortable Telemetry  review shows Sinus bradycardia rates as low as 44 with no AV block. She has been compliant with her meds including beta blocker. CRF's DM, HTN, and Elevated lipids. CRF with baseline Cr around 1.6. Had PPM placed and also went into atrial fib.as she was off BB.  Now with increased HR in a fib causing DOE.    Acute on Chronic Systolic CHF: Admit. BNP over 4000 and weight up On exam does not appear that volume overloaded. IV lasix 40 mg daily Follow BMET- sounds good today, ? Change to po lasix  (-3472 since admit)  Will add back her home lasix except give daily.   Repeated echo to assess degree of MR and EF was done - EF 55% and mild MR, elevated PA pressures  Bradycardia: EP consult saw and PPM placed   PAF: SSS continue anticoagulation. Normal coumadin dose is 1.5 mg M-Sat and 1.25 mg on Sunday INR Rx today 2.2- back on coumadin  DM: Carb modified diet continue prandin glucose stable 90-238  CKD Cr up some at 1.66-->1.54  Anticoagulation: INR 2.06, on coumadin  PPM placed 03/30/15 ST Jude model Assurity- CXR without pneumothorax.  prob D/C today and follow up with TCM next week for the recurrent a fib.   LOS: 3 days   Time spent with pt. :15 minutes. [facility]  Nurse Practitioner Certified Pager [phone] or after 5pm and on weekends call [phone] 03/31/2014, 10:15 AM   I have seen, examined and evaluated the patient this p.m. along with Cecilie Kicks, NP-C.  After reviewing all the available data and chart,  I agree with her findings, examination as well as impression recommendations.  Overall, notably improved from a diastolic heart failure standpoint. Adequately diuresed with no orthopnea. She still gets dyspneic on exertion with increasing heart rate. I do agree with increasing beta blocker which we'll do by increasing to 50 mg twice a day.  She is now status post pacemaker placement, which allows for aggressive rate control without be her bradycardia.  I  would like to see her heart rate response to ambulation is at noon with slightly higher dose of beta blocker (given 37.5 mg today) She was on low-dose hydralazine for afterload reduction as well as ramipril. I will stop the hydralazine and reduce ramipril dose to allow for higher beta blocker dose. This can be titrated back up based on blood pressures as an outpatient.    Overall clinically improved. Stable for discharge today on higher dose of beta blocker low dose of ACE inhibitor with hydralazine stopped.  Leonie Man, M.D., M.S. Interventional Cardiologist   Pager # [phone]     \

## 2014-03-31 NOTE — Progress Notes (Signed)
Patient d/c this afternoon, assessments remains unchanged. D/c instructions given and all questions answered. Wheeled down by the NT.

## 2014-03-31 NOTE — Progress Notes (Signed)
Cardiology PA on call notified of HR increasing to 130s with movement and patient stating she can feel her heart racing, becoming short of breathe when up. HR 80s aflutter Vpaced on demand at rest. No new orders at this time. VSS, Will continue to monitor. Ronnette Hila, RN

## 2014-03-31 NOTE — Telephone Encounter (Signed)
TCM call no answer.LMTC. 

## 2014-04-01 NOTE — Telephone Encounter (Signed)
Patient contacted regarding discharge from [facility] on 03/31/2014  Patient understands to follow up with provider Melina Copa on 04/07/2014 at 8:30 am at the [facility] office . Patient understands discharge instructions?yes Patient understands medications and regiment?yes Patient understands to bring all medications to this visit  Patient states she is doing well. Minimal discomfort, steri strips in place.

## 2014-04-07 ENCOUNTER — Ambulatory Visit (INDEPENDENT_AMBULATORY_CARE_PROVIDER_SITE_OTHER): Payer: Medicare Other | Admitting: *Deleted

## 2014-04-07 ENCOUNTER — Encounter: Payer: Self-pay | Admitting: Physician Assistant

## 2014-04-07 ENCOUNTER — Ambulatory Visit (INDEPENDENT_AMBULATORY_CARE_PROVIDER_SITE_OTHER): Payer: Medicare Other | Admitting: Physician Assistant

## 2014-04-07 ENCOUNTER — Encounter: Payer: Self-pay | Admitting: Internal Medicine

## 2014-04-07 VITALS — BP 142/70 | HR 63 | Wt 196.0 lb

## 2014-04-07 DIAGNOSIS — I10 Essential (primary) hypertension: Secondary | ICD-10-CM | POA: Diagnosis not present

## 2014-04-07 DIAGNOSIS — I5042 Chronic combined systolic (congestive) and diastolic (congestive) heart failure: Secondary | ICD-10-CM | POA: Insufficient documentation

## 2014-04-07 DIAGNOSIS — R001 Bradycardia, unspecified: Secondary | ICD-10-CM

## 2014-04-07 DIAGNOSIS — I480 Paroxysmal atrial fibrillation: Secondary | ICD-10-CM

## 2014-04-07 DIAGNOSIS — D649 Anemia, unspecified: Secondary | ICD-10-CM

## 2014-04-07 DIAGNOSIS — I495 Sick sinus syndrome: Secondary | ICD-10-CM | POA: Diagnosis not present

## 2014-04-07 DIAGNOSIS — I739 Peripheral vascular disease, unspecified: Secondary | ICD-10-CM

## 2014-04-07 DIAGNOSIS — I483 Typical atrial flutter: Secondary | ICD-10-CM | POA: Diagnosis not present

## 2014-04-07 DIAGNOSIS — N183 Chronic kidney disease, stage 3 unspecified: Secondary | ICD-10-CM

## 2014-04-07 DIAGNOSIS — I779 Disorder of arteries and arterioles, unspecified: Secondary | ICD-10-CM | POA: Insufficient documentation

## 2014-04-07 DIAGNOSIS — Z950 Presence of cardiac pacemaker: Secondary | ICD-10-CM

## 2014-04-07 LAB — MDC_IDC_ENUM_SESS_TYPE_INCLINIC
Battery Remaining Longevity: 81.6 mo
Brady Statistic RV Percent Paced: 57 %
Implantable Pulse Generator Serial Number: 7694159
Lead Channel Impedance Value: 725 Ohm
Lead Channel Pacing Threshold Amplitude: 0.75 V
Lead Channel Pacing Threshold Amplitude: 0.75 V
Lead Channel Pacing Threshold Amplitude: 1 V
Lead Channel Pacing Threshold Pulse Width: 0.4 ms
Lead Channel Pacing Threshold Pulse Width: 0.4 ms
Lead Channel Pacing Threshold Pulse Width: 0.5 ms
Lead Channel Sensing Intrinsic Amplitude: 12 mV
Lead Channel Setting Pacing Amplitude: 3.5 V
Lead Channel Setting Pacing Amplitude: 4 V
Lead Channel Setting Pacing Pulse Width: 0.4 ms
Lead Channel Setting Sensing Sensitivity: 2 mV
MDC IDC MSMT BATTERY VOLTAGE: 2.99 V
MDC IDC MSMT LEADCHNL RA IMPEDANCE VALUE: 450 Ohm
MDC IDC MSMT LEADCHNL RA PACING THRESHOLD AMPLITUDE: 1 V
MDC IDC MSMT LEADCHNL RA PACING THRESHOLD PULSEWIDTH: 0.5 ms
MDC IDC MSMT LEADCHNL RA SENSING INTR AMPL: 2.9 mV
MDC IDC SESS DTM: 20151224080903
MDC IDC STAT BRADY RA PERCENT PACED: 36 %

## 2014-04-07 NOTE — Patient Instructions (Signed)
You will need an INR check Monday 04/11/14 when you have this done you will also need a TSH drawn.  Your physician recommends that you schedule a follow-up appointment in: Feb. 2016 with Dr. Acie Fredrickson  Please follow up with your PCP for the monitoring of anemia also make sure to ask Dr.Hopper if you have had a Carotid Duplex test within the last year, let our office know, if you have not had one we will need to order one for you.   Make sure you are taking your LASIX DAILY

## 2014-04-07 NOTE — Progress Notes (Signed)
[place], [place], [place]  [address] Phone: [phone] Fax:  [phone]  Date:  04/07/2014   Patient ID:  Becky Forbes, Becky Forbes November 14, 1940, MRN [id number]   PCP:  Becky Cobble, MD  Cardiologist:  Becky Forbes  Electrophysiologist:  Becky Forbes  History of Present Illness: Becky Forbes is a 73 y.o. 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 She was recently admitted 12/14-12/17 with weight gain and feeling that her pulse was lower than usual. INR was therapeutic and she was treated with IV Lasix in the ED. Telemetry showed SB rates as low as 44 with no AV block. She was admitted and BB was stopped. Amiodarone was continued to prevent PAF. HR remained low and pt evaluated by Becky Forbes.  Despite stopping her BB she continued to feel washed out and with her hx of SSS, pacemaker was placed for symptomatic bradycardia (St. Jude). She was doing well post-procedure but went back into AF with rates up to 130's with activity. BB was restarted and ACE was decreased. DC weight 192lbs, d/c Cr 1.54. 2D echo during admit: posterior basal HK, mildly dilated LV, EF 50-55%, mild MR, severely dilated LA, mod dilated RA. She was discharged on scheduled daily Lasix.   She is feeling much better since hospitalization. No CP, dyspnea, LEE, orthopnea, syncope or evidence of bleeding.  Wound check appt - Wound check appointment. Steri-strips removed. Wound without redness or edema. Incision edges approximated, wound well healed. Normal device function. Thresholds, sensing, and impedances consistent with implant measurements. Device programmed at 3.5V/auto capture programmed on for extra safety margin until 3 month visit. Histogram distribution  appropriate for patient and level of activity. 203 mode switches, + coumadin. Total burden 65%. No high ventricular rates noted. Patient educated about wound care, arm mobility, lifting restrictions. ROV in 3 months with implanting physician.  2D echo 03/28/14 - Left ventricle: Posterior basal hypokinesis. The cavity size was mildly dilated. Wall thickness was normal. Systolic function was normal. The estimated ejection fraction was in the range of 50% to 55%. Doppler parameters are consistent with elevated ventricular end-diastolic filling pressure. - Mitral valve: Calcified annulus. Mildly thickened leaflets . There was mild regurgitation. - Left atrium: The atrium was severely dilated. - Right atrium: The atrium was moderately dilated. - Atrial septum: No defect or patent foramen ovale was identified. - Impressions: PA pressure elevated base on TR velocity. Impressions: - PA pressure elevated base on TR velocity.  Recent Labs: 12/23/2013: ALT 17; HDL Cholesterol by NMR 36.10*; LDL (calc) 99; TSH 2.79 03/28/2014: Hemoglobin 10.0*; Pro B Natriuretic peptide (BNP) 4959.0* 03/31/2014: Creatinine 1.54*; Potassium 4.3  Wt Readings from Last 3 Encounters:  04/07/14 196 lb (88.905 kg)  03/31/14 192 lb 14.4 oz (87.5 kg)  01/18/14 196 lb (88.905 kg)     Past Medical History  Diagnosis Date  . HTN (hypertension)   . Hyperlipemia   . Diabetes mellitus     x 10 yrs  . PAF (paroxysmal atrial fibrillation)   . Symptomatic sinus bradycardia     a. Becky Forbes DR  dual-chamber pacemaker 03/2014 by Becky Forbes.  . Chronic combined systolic and diastolic CHF (congestive heart failure)   . S/P cardiac pacemaker procedure, St.  Jude device 03/31/2014  . Typical atrial flutter   . Sick sinus syndrome   . CKD (chronic kidney disease), stage III   . GI bleed     a. 04/2012 - lower GIB - felt 2/2 mild ischemic colitis as shown on colonoscopy, was cleared for anticoag 1 week out  from procedure.  . Ischemic colitis     a. 04/2012 - lower GIB - felt 2/2 mild ischemic colitis as shown on colonoscopy..  . Anemia   . Carotid artery disease     a. Carotid duplex in 2010: 37-16% RICA, 96-78% LICA.    Current Outpatient Prescriptions  Medication Sig Dispense Refill  . amiodarone (PACERONE) 200 MG tablet Take 1 tablet (200 mg total) by mouth daily. 90 tablet 3  . atorvastatin (LIPITOR) 20 MG tablet Take 20 mg by mouth at bedtime.    . bromocriptine (PARLODEL) 2.5 MG tablet Take 1 tablet (2.5 mg total) by mouth daily. 180 tablet 1  . calcium carbonate (OS-CAL) 600 MG TABS Take 600 mg by mouth daily.     . calcium carbonate (TUMS - DOSED IN MG ELEMENTAL CALCIUM) 500 MG chewable tablet Chew 1 tablet by mouth daily.    . Cholecalciferol (VITAMIN D PO) Take 800 Units by mouth daily.      . furosemide (LASIX) 20 MG tablet Take 1 tablet (20 mg total) by mouth daily. 90 tablet 3  . glucose blood (BAYER CONTOUR NEXT TEST) test strip Use as instructed 100 each 12  . glucose blood test strip 1 each by Other route as needed for other. Use as instructed    . KLOR-CON M10 10 MEQ tablet Take 10 mEq by mouth daily.     . metoprolol (LOPRESSOR) 50 MG tablet Take 1 tablet (50 mg total) by mouth 2 (two) times daily. 180 tablet 3  . Multiple Vitamin (MULITIVITAMIN WITH MINERALS) TABS Take 1 tablet by mouth daily.    . potassium chloride (K-DUR) 10 MEQ tablet Take 1 tablet (10 mEq total) by mouth daily. 30 tablet 6  . ramipril (ALTACE) 5 MG capsule Take 1 capsule (5 mg total) by mouth 2 (two) times daily. 180 capsule 5  . repaglinide (PRANDIN) 2 MG tablet Take 1 tablet (2 mg total) by mouth 3 (three) times daily before meals. 90 tablet 11  . warfarin (COUMADIN) 2.5 MG tablet Take as directed by Coumadin clinic (Patient taking differently: Take 1.25-2.5 mg by mouth daily. Take one tablet (2.5mg ) every day except Sunday.  On Sunday, take half of a tablet (1.25mg ).) 90 tablet 1   No current  facility-administered medications for this visit.    Allergies:   Morphine and related; Actos; Januvia; Percocet; and Hydrocodone-acetaminophen   Social History:  The patient  reports that she has never smoked. She has never used smokeless tobacco. She reports that she does not drink alcohol or use illicit drugs.   Family History:  The patient's family history includes Coronary artery disease in her father; Diabetes in her brother, father, and sister; Hypertension in her father and mother; Kidney failure in her mother and sister; Other in her brother; Stroke in her father.   ROS:  Please see the history of present illness.   All other systems reviewed and negative.   PHYSICAL EXAM:  VS:  BP 142/70 mmHg  Pulse 63  Wt 196 lb (88.905 kg) Well nourished, well developed, well appearing WF in no acute distress HEENT: normal Neck: no JVD, no carotid bruits Cardiac:  normal  S1, S2; RRR; no murmur- left upper pacemaker site without dehiscence or suppuration Lungs:  clear to auscultation bilaterally, no wheezing, rhonchi or rales Abd: soft, nontender, no hepatomegaly Ext: no edema present Skin: warm and dry Neuro:  moves all extremities spontaneously, no focal abnormalities noted  EKG:  atrial paced/NSR, 63bpm, inferolateral T wave changes seen on prior tracing  ASSESSMENT AND PLAN:  1. PAF/paroxysmal atrial flutter - maintaining NSR. Continue current regimen. She will have her INR checked on Monday per our Coumadin clinic (not open today). Will ask them to draw updated TSH at that time given recent recurrence of arrhythmias and amio use. Further monitoring of organ systems while on amiodarone will be at discretion of primary cardiologists. 2. Symptomatic bradycardia s/p recent St. Jude PPM - stable. F/u Becky Forbes in 3 months. 3. Chronic combined HF with improved EF by recent echo - wt is up a little in clinic this AM but she is wearing a very heavy sweater and she forgot to take her Lasix  yesterday. Otherwise she appears euvolemic. Dry weight by home scale is 194 and she will continue to follow this closely. Will not make any med changes today. Salt/fluid restriction discussed along with med compliance. No recent anginal symptoms. 4. CKD stage III - stable during recent admission. 5. Essential HTN - has not taken her meds this AM. Continue current regimen. 6. Carotid artery disease (2010 - 42-59% RICA, 56-38% LICA) - she thinks Dr. Linna Darner has checked a study more recent than this one. I asked her to discuss with him and to let our office know if she has not had one in the last year. If not it will need to be ordered. 7. Anemia - she denies any unusual bleeding. Recent CBC c/w macrocytic anemia as before. I advised she f/u PCP for further monitoring.  Dispo: F/u Dr. Acie Fredrickson in 05/2014 and follow up with Becky Forbes in 06/2013. Also advised to f/u PCP as above.  Signed, Melina Copa, PA-C  04/07/2014 8:57 AM

## 2014-04-07 NOTE — Progress Notes (Signed)
Wound check appointment. Steri-strips removed. Wound without redness or edema. Incision edges approximated, wound well healed. Normal device function. Thresholds, sensing, and impedances consistent with implant measurements. Device programmed at 3.5V/auto capture programmed on for extra safety margin until 3 month visit. Histogram distribution appropriate for patient and level of activity. 203 mode switches, + coumadin.  Total burden 65%.  No high ventricular rates noted. Patient educated about wound care, arm mobility, lifting restrictions. ROV in 3 months with implanting physician.

## 2014-04-11 ENCOUNTER — Ambulatory Visit (INDEPENDENT_AMBULATORY_CARE_PROVIDER_SITE_OTHER): Payer: Medicare Other

## 2014-04-11 DIAGNOSIS — Z7901 Long term (current) use of anticoagulants: Secondary | ICD-10-CM

## 2014-04-11 DIAGNOSIS — I4891 Unspecified atrial fibrillation: Secondary | ICD-10-CM

## 2014-04-11 DIAGNOSIS — Z5181 Encounter for therapeutic drug level monitoring: Secondary | ICD-10-CM | POA: Diagnosis not present

## 2014-04-11 LAB — POCT INR: INR: 2.9

## 2014-04-17 ENCOUNTER — Encounter (HOSPITAL_COMMUNITY): Payer: Self-pay | Admitting: *Deleted

## 2014-04-25 ENCOUNTER — Ambulatory Visit (INDEPENDENT_AMBULATORY_CARE_PROVIDER_SITE_OTHER): Payer: Medicare Other

## 2014-04-25 DIAGNOSIS — Z5181 Encounter for therapeutic drug level monitoring: Secondary | ICD-10-CM

## 2014-04-25 DIAGNOSIS — Z7901 Long term (current) use of anticoagulants: Secondary | ICD-10-CM

## 2014-04-25 DIAGNOSIS — I4891 Unspecified atrial fibrillation: Secondary | ICD-10-CM

## 2014-04-25 LAB — POCT INR: INR: 2.1

## 2014-04-29 DIAGNOSIS — Z950 Presence of cardiac pacemaker: Secondary | ICD-10-CM | POA: Diagnosis not present

## 2014-04-29 DIAGNOSIS — I5042 Chronic combined systolic (congestive) and diastolic (congestive) heart failure: Secondary | ICD-10-CM | POA: Diagnosis not present

## 2014-04-29 DIAGNOSIS — N183 Chronic kidney disease, stage 3 (moderate): Secondary | ICD-10-CM

## 2014-04-29 DIAGNOSIS — I480 Paroxysmal atrial fibrillation: Secondary | ICD-10-CM | POA: Diagnosis not present

## 2014-04-29 DIAGNOSIS — D649 Anemia, unspecified: Secondary | ICD-10-CM | POA: Diagnosis not present

## 2014-04-29 DIAGNOSIS — I779 Disorder of arteries and arterioles, unspecified: Secondary | ICD-10-CM

## 2014-04-29 DIAGNOSIS — I483 Typical atrial flutter: Secondary | ICD-10-CM | POA: Diagnosis not present

## 2014-04-29 DIAGNOSIS — R001 Bradycardia, unspecified: Secondary | ICD-10-CM | POA: Diagnosis not present

## 2014-04-29 DIAGNOSIS — I10 Essential (primary) hypertension: Secondary | ICD-10-CM | POA: Diagnosis not present

## 2014-05-02 ENCOUNTER — Ambulatory Visit: Payer: Medicare Other | Admitting: Endocrinology

## 2014-05-02 DIAGNOSIS — E11331 Type 2 diabetes mellitus with moderate nonproliferative diabetic retinopathy with macular edema: Secondary | ICD-10-CM | POA: Diagnosis not present

## 2014-05-03 ENCOUNTER — Other Ambulatory Visit: Payer: Self-pay | Admitting: Cardiovascular Disease

## 2014-05-06 ENCOUNTER — Ambulatory Visit: Payer: Medicare Other | Admitting: Endocrinology

## 2014-05-13 ENCOUNTER — Encounter: Payer: Self-pay | Admitting: Endocrinology

## 2014-05-13 ENCOUNTER — Ambulatory Visit (INDEPENDENT_AMBULATORY_CARE_PROVIDER_SITE_OTHER): Payer: Medicare Other | Admitting: Endocrinology

## 2014-05-13 VITALS — BP 134/82 | HR 98 | Temp 97.9°F | Ht 64.0 in | Wt 184.5 lb

## 2014-05-13 DIAGNOSIS — N189 Chronic kidney disease, unspecified: Secondary | ICD-10-CM | POA: Diagnosis not present

## 2014-05-13 DIAGNOSIS — E1122 Type 2 diabetes mellitus with diabetic chronic kidney disease: Secondary | ICD-10-CM | POA: Diagnosis not present

## 2014-05-13 LAB — HEMOGLOBIN A1C: HEMOGLOBIN A1C: 7.9 % — AB (ref 4.6–6.5)

## 2014-05-13 NOTE — Patient Instructions (Addendum)
A diabetes blood test is requested for you today.  We'll contact you with results.  If it is high, we can add [place].  However, if it is just a little high, please continue the same medications  check your blood sugar once a day.  vary the time of day when you check, between before the 3 meals, and at bedtime.  also check if you have symptoms of your blood sugar being too high or too low.  please keep a record of the readings and bring it to your next appointment here.  please call us sooner if your blood sugar goes below 70, or if it stays over 200.  Please come back for a follow-up appointment in 3 months.

## 2014-05-13 NOTE — Progress Notes (Signed)
Subjective:    Patient ID: Becky Forbes, female    DOB: Aug 30, 1940, 74 y.o.   MRN: [id number]  HPI Pt returns for f/u of diabetes mellitus: DM type: 2 Dx'ed: 7510 Complications: nephropathy, PAD, and retinopathy Therapy: 2 oral meds GDM: never DKA: never Severe hypoglycemia: never.   Pancreatitis: never. Other: she is not on metformin due to renal insuff; she had edema on actos, and diarrhea on acarbose; she could not afford invokana.  She stopped welchol due to lack of effect.  she has never been on insulin Interval history:  no cbg record, but states cbg's vary from 78-202.  It is in general higher as the day goes on.   Past Medical History  Diagnosis Date  . HTN (hypertension)   . Hyperlipemia   . Diabetes mellitus     x 10 yrs  . PAF (paroxysmal atrial fibrillation)   . Symptomatic sinus bradycardia     a. [place] DR  dual-chamber pacemaker 03/2014 by Dr. Rayann Heman.  . Chronic combined systolic and diastolic CHF (congestive heart failure)   . Typical atrial flutter   . Sick sinus syndrome   . CKD (chronic kidney disease), stage III   . GI bleed     a. 04/2012 - lower GIB - felt 2/2 mild ischemic colitis as shown on colonoscopy, was cleared for anticoag 1 week out from procedure.  . Ischemic colitis     a. 04/2012 - lower GIB - felt 2/2 mild ischemic colitis as shown on colonoscopy..  . Anemia   . Carotid artery disease     a. Carotid duplex in 2010: 25-85% RICA, 27-78% LICA.    Past Surgical History  Procedure Laterality Date  . Cataract extraction  05/25/07    RIGHT EYE  . Appendectomy    . Carpal tunnel release    . Abdominal hysterectomy    . Lumbar laminectomy    . Foot surgery  2006    MID FOOT RECONSTRUCTION  . Osteotomy  2006    AND FUSION  . Ganglion cyst excision  12/2007    Dr.Gramig  . Total knee arthroplasty  05/02/08, 07/25/08    Dr.Alusio  . Joint replacement      bilateral  . 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  Colonoscopy  01/31/2009    small external/internal hemorrhoids, diverticulosis in sigmoid colon, one small polyp (biopsied). Biospy essentially normal. Dr.Magod  . Colonoscopy  04/30/2012    Procedure: COLONOSCOPY;  Surgeon: Cleotis Nipper, MD;  Location: [facility] ENDOSCOPY;  Service: Endoscopy;  Laterality: N/A;  unprepped, poss flex only  . Permanent pacemaker insertion N/A 03/29/2014    STJ Assurity dual chamber paceamker implnated by Dr Rayann Heman    History   Social History  . Marital Status: Married    Spouse Name: JAMES    Number of Children: 2  . Years of Education: N/A   Occupational History  . 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 Smoking status: Never Smoker   . Smokeless tobacco: Never Used  . Alcohol Use: No  . Drug Use: No  . Sexual Activity: Not Currently   Other Topics Concern  . Not on file   Social History Narrative    Current Outpatient Prescriptions on File Prior to Visit  Medication Sig Dispense Refill  . amiodarone (PACERONE) 200 MG tablet Take 1 tablet (200 mg  total) by mouth daily. 90 tablet 3  . atorvastatin (LIPITOR) 20 MG tablet Take 20 mg by mouth at bedtime.    . bromocriptine (PARLODEL) 2.5 MG tablet Take 1 tablet (2.5 mg total) by mouth daily. 180 tablet 1  . calcium carbonate (OS-CAL) 600 MG TABS Take 600 mg by mouth daily.     . calcium carbonate (TUMS - DOSED IN MG ELEMENTAL CALCIUM) 500 MG chewable tablet Chew 1 tablet by mouth daily.    . Cholecalciferol (VITAMIN D PO) Take 800 Units by mouth daily.      . furosemide (LASIX) 20 MG tablet Take 1 tablet (20 mg total) by mouth daily. 90 tablet 3  . glucose blood (BAYER CONTOUR NEXT TEST) test strip Use as instructed 100 each 12  . glucose blood test strip 1 each by Other route as needed for other. Use as instructed    . KLOR-CON M10 10 MEQ tablet Take 10 mEq by mouth daily.     . metoprolol (LOPRESSOR) 50 MG tablet Take 1 tablet (50 mg total) by  mouth 2 (two) times daily. 180 tablet 3  . Multiple Vitamin (MULITIVITAMIN WITH MINERALS) TABS Take 1 tablet by mouth daily.    . potassium chloride (K-DUR) 10 MEQ tablet Take 1 tablet (10 mEq total) by mouth daily. 30 tablet 6  . ramipril (ALTACE) 5 MG capsule Take 1 capsule (5 mg total) by mouth 2 (two) times daily. 180 capsule 5  . repaglinide (PRANDIN) 2 MG tablet Take 1 tablet (2 mg total) by mouth 3 (three) times daily before meals. 90 tablet 11  . warfarin (COUMADIN) 2.5 MG tablet TAKE AS DIRECTED BY COUMADIN CLINIC 90 tablet 0   No current facility-administered medications on file prior to visit.    Allergies  Allergen Reactions  . Morphine And Related Itching, Nausea And Vomiting and Rash  . Actos [Pioglitazone Hydrochloride] Swelling  . Januvia [Sitagliptin Phosphate] Other (See Comments)    Pt says this caused elevated liver enzymes and creatinine  . Percocet [Oxycodone-Acetaminophen] Nausea And Vomiting  . Hydrocodone-Acetaminophen Itching and Nausea And Vomiting    Family History  Problem Relation Age of Onset  . Coronary artery disease Father   . Hypertension Father   . Stroke Father   . Diabetes Father   . Kidney failure Mother   . Hypertension Mother   . Other Brother     SEPSIS  . Diabetes Brother   . Diabetes Sister   . Kidney failure Sister     BP 134/82 mmHg  Pulse 98  Temp(Src) 97.9 F (36.6 C) (Oral)  Ht 5\' 4"  (1.626 m)  Wt 184 lb 8 oz (83.689 kg)  BMI 31.65 kg/m2  SpO2 97%  Review of Systems She denies hypoglycemia and weight change.     Objective:   Physical Exam VITAL SIGNS:  See vs page GENERAL: no distress Pulses: dorsalis pedis intact bilat.   MSK: no deformity of the feet CV: no leg edema Skin:  no ulcer on the feet.  normal color and temp on the feet. Neuro: sensation is intact to touch on the feet Ext: There is bilateral onychomycosis of the toenails.    Lab Results  Component Value Date   HGBA1C 7.9* 05/13/2014         Assessment & Plan:  DM: mild exacerbation.  Pt declines additional medication.   Patient is advised the following: Patient Instructions  A diabetes blood test is requested for you today.  We'll contact you  with results.  If it is high, we can add [place].  However, if it is just a little high, please continue the same medications  check your blood sugar once a day.  vary the time of day when you check, between before the 3 meals, and at bedtime.  also check if you have symptoms of your blood sugar being too high or too low.  please keep a record of the readings and bring it to your next appointment here.  please call us sooner if your blood sugar goes below 70, or if it stays over 200.  Please come back for a follow-up appointment in 3 months.

## 2014-05-16 ENCOUNTER — Ambulatory Visit (INDEPENDENT_AMBULATORY_CARE_PROVIDER_SITE_OTHER): Payer: Medicare Other | Admitting: *Deleted

## 2014-05-16 DIAGNOSIS — Z5181 Encounter for therapeutic drug level monitoring: Secondary | ICD-10-CM

## 2014-05-16 DIAGNOSIS — Z7901 Long term (current) use of anticoagulants: Secondary | ICD-10-CM

## 2014-05-16 DIAGNOSIS — I4891 Unspecified atrial fibrillation: Secondary | ICD-10-CM | POA: Diagnosis not present

## 2014-05-16 LAB — POCT INR: INR: 1.9

## 2014-06-09 ENCOUNTER — Ambulatory Visit (INDEPENDENT_AMBULATORY_CARE_PROVIDER_SITE_OTHER): Payer: Medicare Other | Admitting: Cardiovascular Disease

## 2014-06-09 ENCOUNTER — Ambulatory Visit (INDEPENDENT_AMBULATORY_CARE_PROVIDER_SITE_OTHER): Payer: Medicare Other | Admitting: *Deleted

## 2014-06-09 ENCOUNTER — Encounter: Payer: Self-pay | Admitting: Cardiovascular Disease

## 2014-06-09 VITALS — BP 150/76 | HR 60 | Ht 64.0 in | Wt 198.8 lb

## 2014-06-09 DIAGNOSIS — I10 Essential (primary) hypertension: Secondary | ICD-10-CM | POA: Diagnosis not present

## 2014-06-09 DIAGNOSIS — E785 Hyperlipidemia, unspecified: Secondary | ICD-10-CM | POA: Diagnosis not present

## 2014-06-09 DIAGNOSIS — Z5181 Encounter for therapeutic drug level monitoring: Secondary | ICD-10-CM

## 2014-06-09 DIAGNOSIS — I4891 Unspecified atrial fibrillation: Secondary | ICD-10-CM

## 2014-06-09 DIAGNOSIS — Z7901 Long term (current) use of anticoagulants: Secondary | ICD-10-CM

## 2014-06-09 DIAGNOSIS — I5043 Acute on chronic combined systolic (congestive) and diastolic (congestive) heart failure: Secondary | ICD-10-CM

## 2014-06-09 LAB — POCT INR: INR: 2.6

## 2014-06-09 NOTE — Progress Notes (Signed)
Cardiology Office Note   Date:  06/09/2014   ID:  Becky Forbes, DOB 1940/10/06, MRN [id number]  PCP:  Unice Cobble, MD  Cardiologist:   Acie Fredrickson Wonda Cheng, MD   Chief Complaint  Patient presents with  . Follow-up    atrial fib   1. Atrial fibrillation / atrial Flutter - paroxysmal 2. Diabetes mellitus 3. Hypertension 4. Hyperlipidemia 5. Chronic systolic CHF - EF 19-59% (assumed to be nonischemic) 6. S/p pacer :    History of Present Illness:  [name] not had any episodes of chest pain or shortness of breath. She had back surgery earlier this year. [name] not had any chest pain or shortness breath. Her blood pressure has been well controlled. [name] not had any episodes of atrial fibrillation.  Feb. 4, 2014: She was admitted to the hospital in mid January with a GI bleed and was noted to be in atrial fibrillation. She had an echocardiogram which revealed an ejection fraction of 35-40%. She started on Lasix 20 twice a day and metoprolol. She had some blood work drawn last week which revealed a mild increase in her creatinine up to 1.9. She was also found to have a potassium of 6.1. Recheck potassium level was 4.6. [name] noted that her blood pressures been quite low since she left the hospital. She also was having symptoms of orthostatic hypotension.  July 23, 2012:  [name] is doing well. No further episodes of Afib and no blood in stool. She was admitted to the hospital in January with GI bleeding was found to have atrial fibrillation. Workup at that time revealed what appeared to be ischemic colitis. Her aspirin was held for a week. She improved and has not had any further bleeding. We have been able to restart Coumadin she's not had any bleeding. The bruising that she was having is now better since she has been off the aspirin.  She has a nonischemic cardiomyopathy. Echocardiogram in January revealed an ejection fraction of 35-40%. She has moderate pulmonary hypertension with  an estimated PA pressure of 49. I have reviewed the echo and the results are noted below. Left ventricle: The cavity size was normal. Wall thickness was increased in a pattern of mild LVH. Systolic function was moderately reduced. The estimated ejection fraction was in the range of 35% to 40%. Diffuse hypokinesis. - Mitral valve: Mild regurgitation. - Left atrium: The atrium was mildly dilated. - Right ventricle: The cavity size was mildly dilated. Systolic function was mildly reduced. - Right atrium: The atrium was mildly dilated. - Pulmonary arteries: Systolic pressure was moderately increased. PA peak pressure: 47mm Hg   She continues to have generalized fatigue and malaise.  She has chronic renal insufficiency.  Oct. 23, 2014:  Becky Forbes is in atrial flutter today. No symptoms. breathing is ok. She walks on occasion. [name] not able to tell if her heart rate is irregular or not.  August 03, 2013:   Becky Forbes is doing well. No Cp or dyspnea. Perhaps mild DOE with walking  Echo April 2015 showed improved LV function.  Left ventricle: The cavity size was normal. Systolic function was mildly to moderately reduced. The estimated ejection fraction was in the range of 40% to 45%. Severe hypokinesis of the basal-midinferolateral and inferior myocardium. The study is not technically sufficient to allow evaluation of LV diastolic function. - Mitral valve: Calcified annulus. Mild to moderate regurgitation directed centrally. - Left atrium: The atrium was severely dilated. - Right atrium: The atrium was moderately dilated  September 22, 2013: Becky Forbes is feeling a little bit better. She seems to be tolerating the amiodarone better than the high dose of metoprolol. [name] not having episodes of chest pain. She thinks her heart rate is better.  Her blood pressure here in the office is a little bit elevated. Her blood pressures at home have been all in the normal range.  Sept. 10, 2015:  Becky Forbes is  feeling quite well. She has not noticed any heart rate irregularities. She has felt a lot better since decreasing the dose of amiodarone to 200 mg a day.   Feb. 25, 2016:   Becky Forbes is a 74 y.o. female who presents for follow-up of her atrial fibrillation. Since I last saw her she's had a pacemaker placed.  She is feeling well.  Has some tenderness and " lumpyness around the pacer for the past week.    still has some dizziness.   Past Medical History  Diagnosis Date  . HTN (hypertension)   . Hyperlipemia   . Diabetes mellitus     x 10 yrs  . PAF (paroxysmal atrial fibrillation)   . Symptomatic sinus bradycardia     a. [place] DR  dual-chamber pacemaker 03/2014 by Dr. Rayann Heman.  . Chronic combined systolic and diastolic CHF (congestive heart failure)   . Typical atrial flutter   . Sick sinus syndrome   . CKD (chronic kidney disease), stage III   . GI bleed     a. 04/2012 - lower GIB - felt 2/2 mild ischemic colitis as shown on colonoscopy, was cleared for anticoag 1 week out from procedure.  . Ischemic colitis     a. 04/2012 - lower GIB - felt 2/2 mild ischemic colitis as shown on colonoscopy..  . Anemia   . Carotid artery disease     a. Carotid duplex in 2010: 80-99% RICA, 83-38% LICA.    Past Surgical History  Procedure Laterality Date  . Cataract extraction  05/25/07    RIGHT EYE  . Appendectomy    . Carpal tunnel release    . Abdominal hysterectomy    . Lumbar laminectomy    . Foot surgery  2006    MID FOOT RECONSTRUCTION  . Osteotomy  2006    AND FUSION  . Ganglion cyst excision  12/2007    Dr.Gramig  . Total knee arthroplasty  05/02/08, 07/25/08    Dr.Alusio  . Joint replacement      bilateral  . 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 Colonoscopy  01/31/2009    small external/internal hemorrhoids, diverticulosis in sigmoid colon, one small polyp (biopsied). Biospy essentially normal. Dr.Magod  . Colonoscopy  04/30/2012    Procedure:  COLONOSCOPY;  Surgeon: Cleotis Nipper, MD;  Location: [facility] ENDOSCOPY;  Service: Endoscopy;  Laterality: N/A;  unprepped, poss flex only  . Permanent pacemaker insertion N/A 03/29/2014    STJ Assurity dual chamber paceamker implnated by Dr Rayann Heman     Current Outpatient Prescriptions  Medication Sig Dispense Refill  . amiodarone (PACERONE) 200 MG tablet Take 1 tablet (200 mg total) by mouth daily. 90 tablet 3  . atorvastatin (LIPITOR) 20 MG tablet Take 20 mg by mouth at bedtime.    . bromocriptine (PARLODEL) 2.5 MG tablet Take 1 tablet (2.5 mg total) by mouth daily. 180 tablet 1  . calcium carbonate (OS-CAL) 600 MG TABS Take 600 mg by mouth daily.     . calcium carbonate (TUMS -  DOSED IN MG ELEMENTAL CALCIUM) 500 MG chewable tablet Chew 1 tablet by mouth daily.    . Cholecalciferol (VITAMIN D PO) Take 800 Units by mouth daily.      . furosemide (LASIX) 20 MG tablet Take 1 tablet (20 mg total) by mouth daily. 90 tablet 3  . glucose blood (BAYER CONTOUR NEXT TEST) test strip Use as instructed 100 each 12  . glucose blood test strip 1 each by Other route as needed for other. Use as instructed    . KLOR-CON M10 10 MEQ tablet Take 10 mEq by mouth daily.     . metoprolol (LOPRESSOR) 50 MG tablet Take 1 tablet (50 mg total) by mouth 2 (two) times daily. 180 tablet 3  . Multiple Vitamin (MULITIVITAMIN WITH MINERALS) TABS Take 1 tablet by mouth daily.    . potassium chloride (K-DUR) 10 MEQ tablet Take 1 tablet (10 mEq total) by mouth daily. 30 tablet 6  . ramipril (ALTACE) 5 MG capsule Take 1 capsule (5 mg total) by mouth 2 (two) times daily. 180 capsule 5  . repaglinide (PRANDIN) 2 MG tablet Take 1 tablet (2 mg total) by mouth 3 (three) times daily before meals. 90 tablet 11  . warfarin (COUMADIN) 2.5 MG tablet TAKE AS DIRECTED BY COUMADIN CLINIC 90 tablet 0   No current facility-administered medications for this visit.    Allergies:   Morphine and related; Actos; Januvia; Percocet; and  Hydrocodone-acetaminophen    Social History:  The patient  reports that she has never smoked. She has never used smokeless tobacco. She reports that she does not drink alcohol or use illicit drugs.   Family History:  The patient's family history includes Coronary artery disease in her father; Diabetes in her brother, father, and sister; Hypertension in her father and mother; Kidney failure in her mother and sister; Other in her brother; Stroke in her father.    ROS:  Please see the history of present illness.    Review of Systems: Constitutional:  denies fever, chills, diaphoresis, appetite change and fatigue.  HEENT: denies photophobia, eye pain, redness, hearing loss, ear pain, congestion, sore throat, rhinorrhea, sneezing, neck pain, neck stiffness and tinnitus.  Respiratory: denies SOB, DOE, cough, chest tightness, and wheezing.  Cardiovascular: denies chest pain, palpitations and leg swelling.  Gastrointestinal: denies nausea, vomiting, abdominal pain, diarrhea, constipation, blood in stool.  Genitourinary: denies dysuria, urgency, frequency, hematuria, flank pain and difficulty urinating.  Musculoskeletal: denies  myalgias, back pain, joint swelling, arthralgias and gait problem.   Skin: denies pallor, rash and wound.  Neurological: denies dizziness, seizures, syncope, weakness, light-headedness, numbness and headaches.   Hematological: denies adenopathy, easy bruising, personal or family bleeding history.  Psychiatric/ Behavioral: denies suicidal ideation, mood changes, confusion, nervousness, sleep disturbance and agitation.       All other systems are reviewed and negative.    PHYSICAL EXAM: VS:  BP 150/76 mmHg  Pulse 60  Ht 5\' 4"  (1.626 m)  Wt 198 lb 12.8 oz (90.175 kg)  BMI 34.11 kg/m2 , BMI Body mass index is 34.11 kg/(m^2). GEN: Well nourished, well developed, in no acute distress HEENT: normal Neck: no JVD, carotid bruits, or masses Cardiac: RRR; no murmurs, rubs,  or gallops,no edema  Respiratory:  clear to auscultation bilaterally, normal work of breathing GI: soft, nontender, nondistended, + BS MS: no deformity or atrophy Skin: warm and dry, no rash Neuro:  Strength and sensation are intact Psych: normal   EKG:  EKG is ordered today.  The ekg ordered today demonstrates atrial pacing at 60 .    Recent Labs: 12/23/2013: ALT 17; TSH 2.79 03/28/2014: Hemoglobin 10.0*; Platelets 239; Pro B Natriuretic peptide (BNP) 4959.0* 03/31/2014: BUN 28*; Creatinine 1.54*; Potassium 4.3; Sodium 139    Lipid Panel    Component Value Date/Time   CHOL 166 12/23/2013 0842   TRIG 155.0* 12/23/2013 0842   TRIG 108 04/21/2006 1234   HDL 36.10* 12/23/2013 0842   CHOLHDL 5 12/23/2013 0842   CHOLHDL 2.8 CALC 04/21/2006 1234   VLDL 31.0 12/23/2013 0842   LDLCALC 99 12/23/2013 0842      Wt Readings from Last 3 Encounters:  06/09/14 198 lb 12.8 oz (90.175 kg)  05/13/14 184 lb 8 oz (83.689 kg)  04/07/14 196 lb (88.905 kg)      Other studies Reviewed: Additional studies/ records that were reviewed today include: . Review of the above records demonstrates:    ASSESSMENT AND PLAN:  1. Atrial fibrillation / atrial Flutter - paroxysmal - she is maintainin NSR   2. Diabetes mellitus 3. Hypertension - BP is well controlled.   4. Hyperlipidemia - will check lipids at next visit.   5. Chronic systolic CHF - EF 66-06% (assumed to be nonischemic) - her EF has improved to 55% by echo in Dec. 2015.  6. S/p pacer :   She is doing well , pacer is working ok    Current medicines are reviewed at length with the patient today.  The patient does not have concerns regarding medicines.  The following changes have been made:  no change   Disposition:   FU with me in 6 months     Signed, Nahser, Wonda Cheng, MD  06/09/2014 8:41 AM    [facility] [place], [place], [place]  [address] Phone: [phone]; Fax: [phone]

## 2014-06-09 NOTE — Patient Instructions (Addendum)

## 2014-06-27 DIAGNOSIS — E11331 Type 2 diabetes mellitus with moderate nonproliferative diabetic retinopathy with macular edema: Secondary | ICD-10-CM | POA: Diagnosis not present

## 2014-07-04 ENCOUNTER — Ambulatory Visit (INDEPENDENT_AMBULATORY_CARE_PROVIDER_SITE_OTHER): Payer: Medicare Other | Admitting: Internal Medicine

## 2014-07-04 ENCOUNTER — Encounter: Payer: Self-pay | Admitting: Internal Medicine

## 2014-07-04 ENCOUNTER — Ambulatory Visit (INDEPENDENT_AMBULATORY_CARE_PROVIDER_SITE_OTHER): Payer: Medicare Other | Admitting: Pharmacist

## 2014-07-04 VITALS — BP 140/78 | HR 80 | Ht 64.0 in | Wt 197.4 lb

## 2014-07-04 DIAGNOSIS — I480 Paroxysmal atrial fibrillation: Secondary | ICD-10-CM | POA: Diagnosis not present

## 2014-07-04 DIAGNOSIS — I4891 Unspecified atrial fibrillation: Secondary | ICD-10-CM | POA: Diagnosis not present

## 2014-07-04 DIAGNOSIS — Z7901 Long term (current) use of anticoagulants: Secondary | ICD-10-CM | POA: Diagnosis not present

## 2014-07-04 DIAGNOSIS — I10 Essential (primary) hypertension: Secondary | ICD-10-CM | POA: Diagnosis not present

## 2014-07-04 DIAGNOSIS — Z5181 Encounter for therapeutic drug level monitoring: Secondary | ICD-10-CM

## 2014-07-04 DIAGNOSIS — I495 Sick sinus syndrome: Secondary | ICD-10-CM

## 2014-07-04 DIAGNOSIS — R001 Bradycardia, unspecified: Secondary | ICD-10-CM | POA: Diagnosis not present

## 2014-07-04 LAB — MDC_IDC_ENUM_SESS_TYPE_INCLINIC
Battery Remaining Longevity: 110.4 mo
Battery Voltage: 2.98 V
Brady Statistic RV Percent Paced: 17 %
Implantable Pulse Generator Model: 2240
Lead Channel Impedance Value: 450 Ohm
Lead Channel Impedance Value: 837.5 Ohm
Lead Channel Pacing Threshold Amplitude: 0.5 V
Lead Channel Pacing Threshold Amplitude: 1 V
Lead Channel Pacing Threshold Pulse Width: 0.4 ms
Lead Channel Pacing Threshold Pulse Width: 0.7 ms
Lead Channel Sensing Intrinsic Amplitude: 12 mV
Lead Channel Setting Pacing Amplitude: 2 V
Lead Channel Setting Pacing Pulse Width: 0.4 ms
Lead Channel Setting Sensing Sensitivity: 2 mV
MDC IDC MSMT LEADCHNL RA SENSING INTR AMPL: 1.9 mV
MDC IDC PG SERIAL: 7694159
MDC IDC SESS DTM: 20160321140730
MDC IDC SET LEADCHNL RV PACING AMPLITUDE: 2.5 V
MDC IDC STAT BRADY RA PERCENT PACED: 95 %

## 2014-07-04 LAB — POCT INR: INR: 2.1

## 2014-07-04 NOTE — Patient Instructions (Signed)
Your physician recommends that you continue on your current medications as directed. Please refer to the Current Medication list given to you today.  Your physician wants you to follow-up in: 12 months with Chanetta Marshall NP.  You will receive a reminder letter in the mail two months in advance. If you don't receive a letter, please call our office to schedule the follow-up appointment.  Remote monitoring is used to monitor your Pacemaker or ICD from home. This monitoring reduces the number of office visits required to check your device to one time per year. It allows [place] to keep an eye on the functioning of your device to ensure it is working properly. You are scheduled for a device check from home on 10/03/2014. You may send your transmission at any time that day. If you have a wireless device, the transmission will be sent automatically. After your physician reviews your transmission, you will receive a postcard with your next transmission date.

## 2014-07-04 NOTE — Progress Notes (Signed)
Electrophysiology Office Note   Date:  07/04/2014   ID:  Becky, Forbes Apr 10, 1941, MRN [id number]  PCP:  Becky Cobble, MD  Cardiologist:  Dr Becky Forbes Primary Electrophysiologist: Becky Grayer, MD    Chief Complaint  Patient presents with  . Follow-up    SSS & PAF     History of Present Illness: Becky Forbes is a 74 y.o. female who presents today for electrophysiology evaluation.   She has done very well since her PPM implant.  She is very pleased with her current health state.  Her energy is much improved with PPM.  Her device pocket healed uneventfully.  She is unaware of any further AF.    She has occasional dizziness which she attributes to amiodarone.  Today, she denies symptoms of palpitations, chest pain, shortness of breath, orthopnea, PND, lower extremity edema, claudication, presyncope, syncope, bleeding, or neurologic sequela. The patient is tolerating medications without difficulties and is otherwise without complaint today.    Past Medical History  Diagnosis Date  . HTN (hypertension)   . Hyperlipemia   . Diabetes mellitus     x 10 yrs  . PAF (paroxysmal atrial fibrillation)   . Symptomatic sinus bradycardia     a. [place]  dual-chamber pacemaker 03/2014 by Dr. Rayann Heman.  . Chronic combined systolic and diastolic CHF (congestive heart failure)   . Typical atrial flutter   . Sick sinus syndrome   . CKD (chronic kidney disease), stage III   . GI bleed     a. 04/2012 - lower GIB - felt 2/2 mild ischemic colitis as shown on colonoscopy, was cleared for anticoag 1 week out from procedure.  . Ischemic colitis     a. 04/2012 - lower GIB - felt 2/2 mild ischemic colitis as shown on colonoscopy..  . Anemia   . Carotid artery disease     a. Carotid duplex in 2010: 02-77% RICA, 41-28% LICA.   Past Surgical History  Procedure Laterality Date  . Cataract extraction  05/25/07    RIGHT EYE  . Appendectomy    . Carpal tunnel release    . Abdominal  hysterectomy    . Lumbar laminectomy    . Foot surgery  2006    MID FOOT RECONSTRUCTION  . Osteotomy  2006    AND FUSION  . Ganglion cyst excision  12/2007    Dr.Gramig  . Total knee arthroplasty  05/02/08, 07/25/08    Dr.Alusio  . Joint replacement      bilateral  . 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 Colonoscopy  01/31/2009    small external/internal hemorrhoids, diverticulosis in sigmoid colon, one small polyp (biopsied). Biospy essentially normal. Dr.Magod  . Colonoscopy  04/30/2012    Procedure: COLONOSCOPY;  Surgeon: Cleotis Nipper, MD;  Location: [facility] ENDOSCOPY;  Service: Endoscopy;  Laterality: N/A;  unprepped, poss flex only  . Permanent pacemaker insertion N/A 03/29/2014    STJ Assurity dual chamber paceamker implnated by Dr Rayann Heman     Current Outpatient Prescriptions  Medication Sig Dispense Refill  . amiodarone (PACERONE) 200 MG tablet Take 100 mg by mouth 2 (two) times daily.    Marland Kitchen atorvastatin (LIPITOR) 20 MG tablet Take 20 mg by mouth at bedtime.    . bromocriptine (PARLODEL) 2.5 MG tablet Take 1 tablet (2.5 mg total) by mouth daily. 180 tablet 1  . calcium carbonate (OS-CAL) 600 MG TABS Take 600 mg by mouth daily.     Marland Kitchen  calcium carbonate (TUMS - DOSED IN MG ELEMENTAL CALCIUM) 500 MG chewable tablet Chew 1 tablet by mouth daily.    . Cholecalciferol (VITAMIN D PO) Take 800 Units by mouth daily.      . furosemide (LASIX) 20 MG tablet Take 1 tablet (20 mg total) by mouth daily. 90 tablet 3  . glucose blood (BAYER CONTOUR NEXT TEST) test strip Use as instructed 100 each 12  . glucose blood test strip 1 each by Other route as needed for other. Use as instructed    . KLOR-CON M10 10 MEQ tablet Take 10 mEq by mouth daily.     . metoprolol (LOPRESSOR) 50 MG tablet Take 1 tablet (50 mg total) by mouth 2 (two) times daily. 180 tablet 3  . Multiple Vitamin (MULITIVITAMIN WITH MINERALS) TABS Take 1 tablet by mouth daily.    . potassium chloride (K-DUR) 10 MEQ  tablet Take 1 tablet (10 mEq total) by mouth daily. 30 tablet 6  . ramipril (ALTACE) 5 MG capsule Take 1 capsule (5 mg total) by mouth 2 (two) times daily. 180 capsule 5  . repaglinide (PRANDIN) 2 MG tablet Take 1 tablet (2 mg total) by mouth 3 (three) times daily before meals. 90 tablet 11  . warfarin (COUMADIN) 2.5 MG tablet TAKE AS DIRECTED BY COUMADIN CLINIC 90 tablet 0   No current facility-administered medications for this visit.    Allergies:   Morphine and related; Actos; Januvia; Percocet; and Hydrocodone-acetaminophen   Social History:  The patient  reports that she has never smoked. She has never used smokeless tobacco. She reports that she does not drink alcohol or use illicit drugs.   Family History:  The patient's family history includes Coronary artery disease in her father; Diabetes in her brother, father, and sister; Hypertension in her father and mother; Kidney failure in her mother and sister; Other in her brother; Stroke in her father.    ROS:  Please see the history of present illness.   All other systems are reviewed and negative.    PHYSICAL EXAM: VS:  BP 140/78 mmHg  Pulse 80  Ht 5\' 4"  (1.626 m)  Wt 197 lb 6.4 oz (89.54 kg)  BMI 33.87 kg/m2 , BMI Body mass index is 33.87 kg/(m^2). GEN: Well nourished, well developed, in no acute distress HEENT: normal Neck: no JVD, carotid bruits, or masses Cardiac: RRR; no murmurs, rubs, or gallops,no edema  Respiratory:  clear to auscultation bilaterally, normal work of breathing GI: soft, nontender, nondistended, + BS MS: no deformity or atrophy Skin: warm and dry, device pocket is well healed Neuro:  Strength and sensation are intact Psych: euthymic mood, full affect  EKG:  EKG is ordered today. The ekg ordered today shows AV pacing  Device interrogation is reviewed today in detail.  See PaceArt for details.   Recent Labs: 12/23/2013: ALT 17; TSH 2.79 03/28/2014: Hemoglobin 10.0*; Platelets 239; Pro B Natriuretic  peptide (BNP) 4959.0* 03/31/2014: BUN 28*; Creatinine 1.54*; Potassium 4.3; Sodium 139    Lipid Panel     Component Value Date/Time   CHOL 166 12/23/2013 0842   TRIG 155.0* 12/23/2013 0842   TRIG 108 04/21/2006 1234   HDL 36.10* 12/23/2013 0842   CHOLHDL 5 12/23/2013 0842   CHOLHDL 2.8 CALC 04/21/2006 1234   VLDL 31.0 12/23/2013 0842   LDLCALC 99 12/23/2013 0842     Wt Readings from Last 3 Encounters:  07/04/14 197 lb 6.4 oz (89.54 kg)  06/09/14 198 lb 12.8 oz (90.175  kg)  05/13/14 184 lb 8 oz (83.689 kg)      ASSESSMENT AND PLAN:  1.  Sick sinus syndome Normal pacemaker function See Pace Art report  2. afib Maintaining sinus rhythm the majority of the time, though with severe LA enlargement I anticipate that she may have additional AF in the future Continue long term anticoagulation  3. HTN Stable No change required today  4. CRI Stable No change required today  Current medicines are reviewed at length with the patient today.   The patient does not have concerns regarding her medicines.  The following changes were made today:  none  Labs/ tests ordered today include:  Orders Placed This Encounter  Procedures  . EKG 12-Lead    Follow-up with Dr Becky Forbes as scheduled Merlin Return to see EP NP in 1 year  Signed, Becky Grayer, MD  07/04/2014 11:21 AM     [facility] [address] [place] [address] [phone] (office) [phone] (fax)

## 2014-07-14 ENCOUNTER — Encounter: Payer: Self-pay | Admitting: Internal Medicine

## 2014-08-01 ENCOUNTER — Ambulatory Visit (INDEPENDENT_AMBULATORY_CARE_PROVIDER_SITE_OTHER): Payer: Medicare Other | Admitting: *Deleted

## 2014-08-01 DIAGNOSIS — Z7901 Long term (current) use of anticoagulants: Secondary | ICD-10-CM | POA: Diagnosis not present

## 2014-08-01 DIAGNOSIS — I4891 Unspecified atrial fibrillation: Secondary | ICD-10-CM

## 2014-08-01 DIAGNOSIS — Z5181 Encounter for therapeutic drug level monitoring: Secondary | ICD-10-CM

## 2014-08-01 LAB — POCT INR: INR: 2.6

## 2014-08-02 ENCOUNTER — Telehealth: Payer: Self-pay | Admitting: *Deleted

## 2014-08-02 NOTE — Telephone Encounter (Signed)
Spoke with Vickii Penna at Dr Perley Jain office and she states that pt is to be off coumadin for 5 days prior to colonoscopy on May 16th and has obtained clearance from Dr Cathie Olden as well

## 2014-08-03 ENCOUNTER — Other Ambulatory Visit: Payer: Self-pay | Admitting: Cardiovascular Disease

## 2014-08-04 ENCOUNTER — Telehealth: Payer: Self-pay

## 2014-08-16 NOTE — Telephone Encounter (Signed)
A user error has taken place This was a documentation only for EF update

## 2014-08-23 ENCOUNTER — Ambulatory Visit (INDEPENDENT_AMBULATORY_CARE_PROVIDER_SITE_OTHER): Payer: Medicare Other

## 2014-08-23 DIAGNOSIS — Z7901 Long term (current) use of anticoagulants: Secondary | ICD-10-CM

## 2014-08-23 DIAGNOSIS — Z5181 Encounter for therapeutic drug level monitoring: Secondary | ICD-10-CM

## 2014-08-23 DIAGNOSIS — I4891 Unspecified atrial fibrillation: Secondary | ICD-10-CM

## 2014-08-23 LAB — POCT INR: INR: 1.6

## 2014-08-24 DIAGNOSIS — E11339 Type 2 diabetes mellitus with moderate nonproliferative diabetic retinopathy without macular edema: Secondary | ICD-10-CM | POA: Diagnosis not present

## 2014-08-24 DIAGNOSIS — E11331 Type 2 diabetes mellitus with moderate nonproliferative diabetic retinopathy with macular edema: Secondary | ICD-10-CM | POA: Diagnosis not present

## 2014-08-29 ENCOUNTER — Other Ambulatory Visit: Payer: Self-pay | Admitting: Gastroenterology

## 2014-08-29 DIAGNOSIS — K5730 Diverticulosis of large intestine without perforation or abscess without bleeding: Secondary | ICD-10-CM | POA: Diagnosis not present

## 2014-08-29 DIAGNOSIS — Z1211 Encounter for screening for malignant neoplasm of colon: Secondary | ICD-10-CM | POA: Diagnosis not present

## 2014-08-29 DIAGNOSIS — K635 Polyp of colon: Secondary | ICD-10-CM | POA: Diagnosis not present

## 2014-08-29 DIAGNOSIS — D123 Benign neoplasm of transverse colon: Secondary | ICD-10-CM | POA: Diagnosis not present

## 2014-08-29 DIAGNOSIS — D124 Benign neoplasm of descending colon: Secondary | ICD-10-CM | POA: Diagnosis not present

## 2014-08-29 DIAGNOSIS — D125 Benign neoplasm of sigmoid colon: Secondary | ICD-10-CM | POA: Diagnosis not present

## 2014-08-29 DIAGNOSIS — Z800 Family history of malignant neoplasm of digestive organs: Secondary | ICD-10-CM | POA: Diagnosis not present

## 2014-08-29 DIAGNOSIS — D126 Benign neoplasm of colon, unspecified: Secondary | ICD-10-CM | POA: Diagnosis not present

## 2014-09-03 ENCOUNTER — Other Ambulatory Visit: Payer: Self-pay | Admitting: Cardiovascular Disease

## 2014-09-09 ENCOUNTER — Ambulatory Visit (INDEPENDENT_AMBULATORY_CARE_PROVIDER_SITE_OTHER): Payer: Medicare Other | Admitting: *Deleted

## 2014-09-09 DIAGNOSIS — I4891 Unspecified atrial fibrillation: Secondary | ICD-10-CM

## 2014-09-09 DIAGNOSIS — Z5181 Encounter for therapeutic drug level monitoring: Secondary | ICD-10-CM

## 2014-09-09 DIAGNOSIS — Z7901 Long term (current) use of anticoagulants: Secondary | ICD-10-CM | POA: Diagnosis not present

## 2014-09-09 LAB — POCT INR: INR: 1.7

## 2014-09-13 ENCOUNTER — Ambulatory Visit: Payer: Medicare Other | Admitting: Endocrinology

## 2014-09-26 ENCOUNTER — Ambulatory Visit (INDEPENDENT_AMBULATORY_CARE_PROVIDER_SITE_OTHER): Payer: Medicare Other | Admitting: *Deleted

## 2014-09-26 ENCOUNTER — Encounter: Payer: Self-pay | Admitting: Internal Medicine

## 2014-09-26 DIAGNOSIS — I4891 Unspecified atrial fibrillation: Secondary | ICD-10-CM

## 2014-09-26 DIAGNOSIS — Z7901 Long term (current) use of anticoagulants: Secondary | ICD-10-CM | POA: Diagnosis not present

## 2014-09-26 DIAGNOSIS — Z5181 Encounter for therapeutic drug level monitoring: Secondary | ICD-10-CM

## 2014-09-26 DIAGNOSIS — Z86010 Personal history of colonic polyps: Secondary | ICD-10-CM | POA: Insufficient documentation

## 2014-09-26 LAB — POCT INR: INR: 3.4

## 2014-10-03 ENCOUNTER — Ambulatory Visit (INDEPENDENT_AMBULATORY_CARE_PROVIDER_SITE_OTHER): Payer: Medicare Other | Admitting: *Deleted

## 2014-10-03 DIAGNOSIS — I495 Sick sinus syndrome: Secondary | ICD-10-CM | POA: Diagnosis not present

## 2014-10-03 NOTE — Progress Notes (Signed)
Remote pacemaker transmission.   

## 2014-10-04 LAB — CUP PACEART REMOTE DEVICE CHECK
Battery Remaining Longevity: 107 mo
Battery Remaining Percentage: 95.5 %
Battery Voltage: 2.99 V
Brady Statistic AP VP Percent: 24 %
Brady Statistic RA Percent Paced: 99 %
Brady Statistic RV Percent Paced: 25 %
Date Time Interrogation Session: 20160620060016
Lead Channel Impedance Value: 390 Ohm
Lead Channel Pacing Threshold Amplitude: 1 V
Lead Channel Pacing Threshold Pulse Width: 0.4 ms
Lead Channel Pacing Threshold Pulse Width: 0.7 ms
Lead Channel Sensing Intrinsic Amplitude: 1.1 mV
Lead Channel Setting Pacing Pulse Width: 0.4 ms
Lead Channel Setting Sensing Sensitivity: 2 mV
MDC IDC MSMT LEADCHNL RV IMPEDANCE VALUE: 810 Ohm
MDC IDC MSMT LEADCHNL RV PACING THRESHOLD AMPLITUDE: 0.5 V
MDC IDC MSMT LEADCHNL RV SENSING INTR AMPL: 12 mV
MDC IDC SET LEADCHNL RA PACING AMPLITUDE: 2 V
MDC IDC SET LEADCHNL RV PACING AMPLITUDE: 2.5 V
MDC IDC STAT BRADY AP VS PERCENT: 76 %
MDC IDC STAT BRADY AS VP PERCENT: 1 %
MDC IDC STAT BRADY AS VS PERCENT: 1 %
Pulse Gen Model: 2240
Pulse Gen Serial Number: 7694159

## 2014-10-06 ENCOUNTER — Encounter: Payer: Self-pay | Admitting: Cardiology

## 2014-10-11 ENCOUNTER — Encounter: Payer: Self-pay | Admitting: Endocrinology

## 2014-10-11 ENCOUNTER — Ambulatory Visit (INDEPENDENT_AMBULATORY_CARE_PROVIDER_SITE_OTHER): Payer: Medicare Other | Admitting: *Deleted

## 2014-10-11 ENCOUNTER — Ambulatory Visit (INDEPENDENT_AMBULATORY_CARE_PROVIDER_SITE_OTHER): Payer: Medicare Other | Admitting: Endocrinology

## 2014-10-11 VITALS — BP 134/84 | HR 83 | Temp 98.1°F | Ht 64.0 in | Wt 200.0 lb

## 2014-10-11 DIAGNOSIS — Z5181 Encounter for therapeutic drug level monitoring: Secondary | ICD-10-CM | POA: Diagnosis not present

## 2014-10-11 DIAGNOSIS — E1122 Type 2 diabetes mellitus with diabetic chronic kidney disease: Secondary | ICD-10-CM

## 2014-10-11 DIAGNOSIS — N189 Chronic kidney disease, unspecified: Secondary | ICD-10-CM | POA: Diagnosis not present

## 2014-10-11 DIAGNOSIS — Z7901 Long term (current) use of anticoagulants: Secondary | ICD-10-CM

## 2014-10-11 DIAGNOSIS — I4891 Unspecified atrial fibrillation: Secondary | ICD-10-CM | POA: Diagnosis not present

## 2014-10-11 LAB — POCT INR: INR: 2.2

## 2014-10-11 LAB — HEMOGLOBIN A1C: Hgb A1c MFr Bld: 7 % — ABNORMAL HIGH (ref 4.6–6.5)

## 2014-10-11 MED ORDER — REPAGLINIDE 2 MG PO TABS: 2.0000 mg | ORAL_TABLET | Freq: Three times a day (TID) | ORAL | Status: DC

## 2014-10-11 NOTE — Patient Instructions (Signed)
A diabetes blood test is requested for you today.  We'll contact you with results.  If it is high, we can add [place].  However, if it is just a little high, please continue the same medications.    check your blood sugar once a day.  vary the time of day when you check, between before the 3 meals, and at bedtime.  also check if you have symptoms of your blood sugar being too high or too low.  please keep a record of the readings and bring it to your next appointment here.  please call us sooner if your blood sugar goes below 70, or if it stays over 200.  Please come back for a follow-up appointment in 3 months.

## 2014-10-11 NOTE — Progress Notes (Signed)
Subjective:    Patient ID: Becky Forbes, female    DOB: [birth year], 74 y.o.   MRN: [id number]  HPI Pt returns for f/u of diabetes mellitus: DM type: 2 Dx'ed: 6301 Complications: nephropathy, PAD, and retinopathy Therapy: 2 oral meds GDM: never DKA: never Severe hypoglycemia: never.   Pancreatitis: never. Other: she is not on metformin due to renal insuff; she had edema on actos, and diarrhea on acarbose; she could not afford invokana.  She stopped welchol due to lack of effect.  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 Interval hx: no cbg record, but states cbg's vary from 76-222.  It is in general higher as the day goes on.  Past Medical History  Diagnosis Date  . HTN (hypertension)   . Hyperlipemia   . Diabetes mellitus     x 10 yrs  . PAF (paroxysmal atrial fibrillation)   . Symptomatic sinus bradycardia     a. [place] DR  dual-chamber pacemaker 03/2014 by Dr. Rayann Heman.  . Chronic combined systolic and diastolic CHF (congestive heart failure)   . Typical atrial flutter   . Sick sinus syndrome   . CKD (chronic kidney disease), stage III   . GI bleed     a. 04/2012 - lower GIB - felt 2/2 mild ischemic colitis as shown on colonoscopy, was cleared for anticoag 1 week out from procedure.  . Ischemic colitis     a. 04/2012 - lower GIB - felt 2/2 mild ischemic colitis as shown on colonoscopy..  . Anemia   . Carotid artery disease     a. Carotid duplex in 2010: 60-10% RICA, 93-23% LICA.    Past Surgical History  Procedure Laterality Date  . Cataract extraction  05/25/07    RIGHT EYE  . Appendectomy    . Carpal tunnel release    . Abdominal hysterectomy    . Lumbar laminectomy    . Foot surgery  2006    MID FOOT RECONSTRUCTION  . Osteotomy  2006    AND FUSION  . Ganglion cyst excision  12/2007    Dr.Gramig  . Total knee arthroplasty  05/02/08, 07/25/08    Dr.Alusio  . Joint replacement      bilateral  . Pars plana vitrectomy w/  repair of macular hole    . Colonoscopy  01/31/2009    small external/internal hemorrhoids, diverticulosis in sigmoid colon, one small polyp (biopsied). Biospy essentially normal. Dr.Magod  . Colonoscopy  04/30/2012    Procedure: COLONOSCOPY;  Surgeon: Cleotis Nipper, MD;  Location: [facility] ENDOSCOPY;  Service: Endoscopy;  Laterality: N/A;  unprepped, poss flex only  . Permanent pacemaker insertion N/A 03/29/2014    STJ Assurity dual chamber paceamker implnated by Dr Rayann Heman    History   Social History  . Marital Status: Married    Spouse Name: Jeneen Rinks  . Number of Children: 2  . Years of Education: N/A   Occupational History  . 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 Smoking status: Never Smoker   . Smokeless tobacco: Never Used  . Alcohol Use: No  . Drug Use: No  . Sexual Activity: Not Currently   Other Topics Concern  . Not on file   Social History Narrative    Current Outpatient Prescriptions on File Prior to Visit  Medication Sig Dispense Refill  . amiodarone (PACERONE) 200 MG  tablet Take 100 mg by mouth 2 (two) times daily.    Marland Kitchen amiodarone (PACERONE) 200 MG tablet TAKE ONE TABLET BY MOUTH TWICE DAILY 180 tablet 0  . atorvastatin (LIPITOR) 20 MG tablet Take 20 mg by mouth at bedtime.    . bromocriptine (PARLODEL) 2.5 MG tablet Take 1 tablet (2.5 mg total) by mouth daily. 180 tablet 1  . calcium carbonate (OS-CAL) 600 MG TABS Take 600 mg by mouth daily.     . calcium carbonate (TUMS - DOSED IN MG ELEMENTAL CALCIUM) 500 MG chewable tablet Chew 1 tablet by mouth daily.    . Cholecalciferol (VITAMIN D PO) Take 800 Units by mouth daily.      . furosemide (LASIX) 20 MG tablet Take 1 tablet (20 mg total) by mouth daily. 90 tablet 3  . glucose blood (BAYER CONTOUR NEXT TEST) test strip Use as instructed 100 each 12  . glucose blood test strip 1 each by Other route as needed for other. Use as instructed    . KLOR-CON M10 10 MEQ tablet  Take 10 mEq by mouth daily.     . metoprolol (LOPRESSOR) 50 MG tablet Take 1 tablet (50 mg total) by mouth 2 (two) times daily. 180 tablet 3  . Multiple Vitamin (MULITIVITAMIN WITH MINERALS) TABS Take 1 tablet by mouth daily.    . potassium chloride (K-DUR) 10 MEQ tablet Take 1 tablet (10 mEq total) by mouth daily. 30 tablet 6  . ramipril (ALTACE) 5 MG capsule Take 1 capsule (5 mg total) by mouth 2 (two) times daily. 180 capsule 5  . warfarin (COUMADIN) 2.5 MG tablet TAKE AS DIRECTED BY COUMADIN CLINIC 90 tablet 0   No current facility-administered medications on file prior to visit.    Allergies  Allergen Reactions  . Morphine And Related Itching, Nausea And Vomiting and Rash  . Actos [Pioglitazone Hydrochloride] Swelling  . Januvia [Sitagliptin Phosphate] Other (See Comments)    Pt says this caused elevated liver enzymes and creatinine  . Percocet [Oxycodone-Acetaminophen] Nausea And Vomiting  . Hydrocodone-Acetaminophen Itching and Nausea And Vomiting    Family History  Problem Relation Age of Onset  . Coronary artery disease Father   . Hypertension Father   . Stroke Father   . Diabetes Father   . Kidney failure Mother   . Hypertension Mother   . Other Brother     SEPSIS  . Diabetes Brother   . Diabetes Sister   . Kidney failure Sister     BP 134/84 mmHg  Pulse 83  Temp(Src) 98.1 F (36.7 C) (Oral)  Ht 5\' 4"  (1.626 m)  Wt 200 lb (90.719 kg)  BMI 34.31 kg/m2  SpO2 93%  Review of Systems She denies hypoglycemia    Objective:   Physical Exam VITAL SIGNS:  See vs page GENERAL: no distress Pulses: dorsalis pedis intact bilat.   MSK: no deformity of the feet CV: no leg edema Skin:  no ulcer on the feet.  normal color and temp on the feet. Neuro: sensation is intact to touch on the feet Ext: There is bilateral onychomycosis of the toenails.   Lab Results  Component Value Date   HGBA1C 7.0* 10/11/2014       Assessment & Plan:  DM:  well-controlled.   Patient is advised the following: Patient Instructions  A diabetes blood test is requested for you today.  We'll contact you with results.  If it is high, we can add [place].  However, if it is  just a little high, please continue the same medications.    check your blood sugar once a day.  vary the time of day when you check, between before the 3 meals, and at bedtime.  also check if you have symptoms of your blood sugar being too high or too low.  please keep a record of the readings and bring it to your next appointment here.  please call us sooner if your blood sugar goes below 70, or if it stays over 200.  Please come back for a follow-up appointment in 3 months.    addendum: Please continue the same medications.

## 2014-10-12 ENCOUNTER — Encounter: Payer: Self-pay | Admitting: Internal Medicine

## 2014-10-17 ENCOUNTER — Other Ambulatory Visit: Payer: Self-pay | Admitting: Cardiovascular Disease

## 2014-10-18 ENCOUNTER — Other Ambulatory Visit: Payer: Self-pay

## 2014-10-18 MED ORDER — METOPROLOL TARTRATE 50 MG PO TABS: 50.0000 mg | ORAL_TABLET | Freq: Two times a day (BID) | ORAL | Status: DC

## 2014-10-18 MED ORDER — FUROSEMIDE 20 MG PO TABS: 20.0000 mg | ORAL_TABLET | Freq: Every day | ORAL | Status: DC

## 2014-11-01 ENCOUNTER — Ambulatory Visit (INDEPENDENT_AMBULATORY_CARE_PROVIDER_SITE_OTHER): Payer: Medicare Other | Admitting: *Deleted

## 2014-11-01 DIAGNOSIS — Z7901 Long term (current) use of anticoagulants: Secondary | ICD-10-CM

## 2014-11-01 DIAGNOSIS — Z5181 Encounter for therapeutic drug level monitoring: Secondary | ICD-10-CM | POA: Diagnosis not present

## 2014-11-01 DIAGNOSIS — I4891 Unspecified atrial fibrillation: Secondary | ICD-10-CM

## 2014-11-01 LAB — POCT INR: INR: 2.7

## 2014-11-05 ENCOUNTER — Other Ambulatory Visit: Payer: Self-pay | Admitting: Cardiovascular Disease

## 2014-11-14 DIAGNOSIS — E11331 Type 2 diabetes mellitus with moderate nonproliferative diabetic retinopathy with macular edema: Secondary | ICD-10-CM | POA: Diagnosis not present

## 2014-11-22 ENCOUNTER — Ambulatory Visit (INDEPENDENT_AMBULATORY_CARE_PROVIDER_SITE_OTHER): Payer: Medicare Other | Admitting: *Deleted

## 2014-11-22 DIAGNOSIS — I4891 Unspecified atrial fibrillation: Secondary | ICD-10-CM

## 2014-11-22 DIAGNOSIS — Z7901 Long term (current) use of anticoagulants: Secondary | ICD-10-CM

## 2014-11-22 DIAGNOSIS — Z5181 Encounter for therapeutic drug level monitoring: Secondary | ICD-10-CM

## 2014-11-22 LAB — POCT INR: INR: 2.7

## 2014-12-13 ENCOUNTER — Encounter: Payer: Self-pay | Admitting: Cardiovascular Disease

## 2014-12-13 ENCOUNTER — Ambulatory Visit (INDEPENDENT_AMBULATORY_CARE_PROVIDER_SITE_OTHER): Payer: Medicare Other | Admitting: Cardiovascular Disease

## 2014-12-13 ENCOUNTER — Other Ambulatory Visit (INDEPENDENT_AMBULATORY_CARE_PROVIDER_SITE_OTHER): Payer: Medicare Other | Admitting: *Deleted

## 2014-12-13 ENCOUNTER — Ambulatory Visit (INDEPENDENT_AMBULATORY_CARE_PROVIDER_SITE_OTHER): Payer: Medicare Other

## 2014-12-13 VITALS — BP 140/60 | HR 60 | Ht 64.0 in | Wt 204.8 lb

## 2014-12-13 DIAGNOSIS — E785 Hyperlipidemia, unspecified: Secondary | ICD-10-CM

## 2014-12-13 DIAGNOSIS — I480 Paroxysmal atrial fibrillation: Secondary | ICD-10-CM

## 2014-12-13 DIAGNOSIS — D649 Anemia, unspecified: Secondary | ICD-10-CM | POA: Diagnosis not present

## 2014-12-13 DIAGNOSIS — D62 Acute posthemorrhagic anemia: Secondary | ICD-10-CM | POA: Diagnosis not present

## 2014-12-13 DIAGNOSIS — I4891 Unspecified atrial fibrillation: Secondary | ICD-10-CM

## 2014-12-13 DIAGNOSIS — I10 Essential (primary) hypertension: Secondary | ICD-10-CM | POA: Diagnosis not present

## 2014-12-13 DIAGNOSIS — R5383 Other fatigue: Secondary | ICD-10-CM

## 2014-12-13 DIAGNOSIS — I5042 Chronic combined systolic (congestive) and diastolic (congestive) heart failure: Secondary | ICD-10-CM

## 2014-12-13 DIAGNOSIS — Z5181 Encounter for therapeutic drug level monitoring: Secondary | ICD-10-CM

## 2014-12-13 DIAGNOSIS — I5043 Acute on chronic combined systolic (congestive) and diastolic (congestive) heart failure: Secondary | ICD-10-CM

## 2014-12-13 DIAGNOSIS — Z7901 Long term (current) use of anticoagulants: Secondary | ICD-10-CM

## 2014-12-13 LAB — BASIC METABOLIC PANEL
BUN: 25 mg/dL — AB (ref 6–23)
CHLORIDE: 100 meq/L (ref 96–112)
CO2: 25 mEq/L (ref 19–32)
Calcium: 9.4 mg/dL (ref 8.4–10.5)
Creatinine, Ser: 1.67 mg/dL — ABNORMAL HIGH (ref 0.40–1.20)
GFR: 31.8 mL/min — AB (ref >60.00)
GLUCOSE: 77 mg/dL (ref 70–99)
POTASSIUM: 4.6 meq/L (ref 3.5–5.1)
SODIUM: 136 meq/L (ref 135–145)

## 2014-12-13 LAB — LIPID PANEL
CHOLESTEROL: 144 mg/dL (ref 0–200)
HDL: 40.7 mg/dL (ref >39.00)
LDL Cholesterol: 85 mg/dL (ref 0–99)
NonHDL: 103.44
Total CHOL/HDL Ratio: 4
Triglycerides: 93 mg/dL (ref 0.0–149.0)
VLDL: 18.6 mg/dL (ref 0.0–40.0)

## 2014-12-13 LAB — HEPATIC FUNCTION PANEL
ALK PHOS: 104 U/L (ref 39–117)
ALT: 26 U/L (ref 0–35)
AST: 38 U/L — AB (ref 0–37)
Albumin: 3.8 g/dL (ref 3.5–5.2)
Bilirubin, Direct: 0.2 mg/dL (ref 0.0–0.3)
Total Bilirubin: 1.2 mg/dL (ref 0.2–1.2)
Total Protein: 7.6 g/dL (ref 6.0–8.3)

## 2014-12-13 LAB — TSH: TSH: 12.11 u[IU]/mL — AB (ref 0.35–4.50)

## 2014-12-13 LAB — POCT INR: INR: 2.4

## 2014-12-13 MED ORDER — AMIODARONE HCL 200 MG PO TABS: 200.0000 mg | ORAL_TABLET | Freq: Every day | ORAL | Status: DC

## 2014-12-13 NOTE — Progress Notes (Signed)
Cardiology Office Note   Date:  12/13/2014   ID:  Becky Forbes, Becky Forbes 1940/08/05, MRN [id number]  PCP:  Unice Cobble, MD  Cardiologist:   Acie Fredrickson Wonda Cheng, MD   Chief Complaint  Patient presents with  . essential hypertension   1. Atrial fibrillation / atrial Flutter - paroxysmal 2. Diabetes mellitus 3. Hypertension 4. Hyperlipidemia 5. Chronic systolic CHF - EF 13-24% (assumed to be nonischemic) 6. S/p pacer :    History of Present Illness:  [name] not had any episodes of chest pain or shortness of breath. She had back surgery earlier this year. [name] not had any chest pain or shortness breath. Her blood pressure has been well controlled. [name] not had any episodes of atrial fibrillation.  Feb. 4, 2014: She was admitted to the hospital in mid January with a GI bleed and was noted to be in atrial fibrillation. She had an echocardiogram which revealed an ejection fraction of 35-40%. She started on Lasix 20 twice a day and metoprolol. She had some blood work drawn last week which revealed a mild increase in her creatinine up to 1.9. She was also found to have a potassium of 6.1. Recheck potassium level was 4.6. [name] noted that her blood pressures been quite low since she left the hospital. She also was having symptoms of orthostatic hypotension.  July 23, 2012:  Becky Forbes is doing well. No further episodes of Afib and no blood in stool. She was admitted to the hospital in January with GI bleeding was found to have atrial fibrillation. Workup at that time revealed what appeared to be ischemic colitis. Her aspirin was held for a week. She improved and has not had any further bleeding. We have been able to restart Coumadin she's not had any bleeding. The bruising that she was having is now better since she has been off the aspirin.  She has a nonischemic cardiomyopathy. Echocardiogram in January revealed an ejection fraction of 35-40%. She has moderate pulmonary hypertension with an  estimated PA pressure of 49. I have reviewed the echo and the results are noted below. Left ventricle: The cavity size was normal. Wall thickness was increased in a pattern of mild LVH. Systolic function was moderately reduced. The estimated ejection fraction was in the range of 35% to 40%. Diffuse hypokinesis. - Mitral valve: Mild regurgitation. - Left atrium: The atrium was mildly dilated. - Right ventricle: The cavity size was mildly dilated. Systolic function was mildly reduced. - Right atrium: The atrium was mildly dilated. - Pulmonary arteries: Systolic pressure was moderately increased. PA peak pressure: 22mm Hg   She continues to have generalized fatigue and malaise.  She has chronic renal insufficiency.  Oct. 23, 2014:  [name] is in atrial flutter today. No symptoms. breathing is ok. She walks on occasion. [name] not able to tell if her heart rate is irregular or not.  August 03, 2013:   Becky Forbes is doing well. No Cp or dyspnea. Perhaps mild DOE with walking  Echo April 2015 showed improved LV function.  Left ventricle: The cavity size was normal. Systolic function was mildly to moderately reduced. The estimated ejection fraction was in the range of 40% to 45%. Severe hypokinesis of the basal-midinferolateral and inferior myocardium. The study is not technically sufficient to allow evaluation of LV diastolic function. - Mitral valve: Calcified annulus. Mild to moderate regurgitation directed centrally. - Left atrium: The atrium was severely dilated. - Right atrium: The atrium was moderately dilated  September 22, 2013:  Becky Forbes is feeling a little bit better. She seems to be tolerating the amiodarone better than the high dose of metoprolol. [name] not having episodes of chest pain. She thinks her heart rate is better.  Her blood pressure here in the office is a little bit elevated. Her blood pressures at home have been all in the normal range.  Sept. 10, 2015:  Becky Forbes is  feeling quite well. She has not noticed any heart rate irregularities. She has felt a lot better since decreasing the dose of amiodarone to 200 mg a day.   Feb. 25, 2016:   Becky Forbes is a 74 y.o. female who presents for follow-up of her atrial fibrillation. Since I last saw her she's had a pacemaker placed.  She is feeling well.  Has some tenderness and " lumpyness around the pacer for the past week.    still has some dizziness.   12/13/2014: Still has lack of energy  Fatigues with walking .    Past Medical History  Diagnosis Date  . HTN (hypertension)   . Hyperlipemia   . Diabetes mellitus     x 10 yrs  . PAF (paroxysmal atrial fibrillation)   . Symptomatic sinus bradycardia     a. [place] DR  dual-chamber pacemaker 03/2014 by Dr. Rayann Heman.  . Chronic combined systolic and diastolic CHF (congestive heart failure)   . Typical atrial flutter   . Sick sinus syndrome   . CKD (chronic kidney disease), stage III   . GI bleed     a. 04/2012 - lower GIB - felt 2/2 mild ischemic colitis as shown on colonoscopy, was cleared for anticoag 1 week out from procedure.  . Ischemic colitis     a. 04/2012 - lower GIB - felt 2/2 mild ischemic colitis as shown on colonoscopy..  . Anemia   . Carotid artery disease     a. Carotid duplex in 2010: 78-93% RICA, 81-01% LICA.    Past Surgical History  Procedure Laterality Date  . Cataract extraction  05/25/07    RIGHT EYE  . Appendectomy    . Carpal tunnel release    . Abdominal hysterectomy    . Lumbar laminectomy    . Foot surgery  2006    MID FOOT RECONSTRUCTION  . Osteotomy  2006    AND FUSION  . Ganglion cyst excision  12/2007    Dr.Gramig  . Total knee arthroplasty  05/02/08, 07/25/08    Dr.Alusio  . Joint replacement      bilateral  . 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 Colonoscopy  01/31/2009    small external/internal hemorrhoids, diverticulosis in sigmoid colon, one small polyp (biopsied). Biospy  essentially normal. Dr.Magod  . Colonoscopy  04/30/2012    Procedure: COLONOSCOPY;  Surgeon: Cleotis Nipper, MD;  Location: [facility] ENDOSCOPY;  Service: Endoscopy;  Laterality: N/A;  unprepped, poss flex only  . Permanent pacemaker insertion N/A 03/29/2014    [employer] dual chamber paceamker implnated by Dr Rayann Heman     Current Outpatient Prescriptions  Medication Sig Dispense Refill  . amiodarone (PACERONE) 200 MG tablet TAKE ONE TABLET BY MOUTH TWICE DAILY 180 tablet 0  . atorvastatin (LIPITOR) 20 MG tablet Take 20 mg by mouth at bedtime.    . bromocriptine (PARLODEL) 2.5 MG tablet Take 1 tablet (2.5 mg total) by mouth daily. 180 tablet 1  . calcium carbonate (OS-CAL) 600 MG TABS Take 600 mg by mouth daily.     Marland Kitchen  calcium carbonate (TUMS - DOSED IN MG ELEMENTAL CALCIUM) 500 MG chewable tablet Chew 1 tablet by mouth daily.    . Cholecalciferol (VITAMIN D PO) Take 800 Units by mouth daily.      . furosemide (LASIX) 20 MG tablet Take 1 tablet (20 mg total) by mouth daily. 90 tablet 1  . glucose blood (BAYER CONTOUR NEXT TEST) test strip Use as instructed 100 each 12  . metoprolol (LOPRESSOR) 50 MG tablet Take 1 tablet (50 mg total) by mouth 2 (two) times daily. 180 tablet 1  . Multiple Vitamin (MULITIVITAMIN WITH MINERALS) TABS Take 1 tablet by mouth daily.    . potassium chloride (KLOR-CON M10) 10 MEQ tablet Take 10 mEq by mouth daily.    . ramipril (ALTACE) 5 MG capsule Take 1 capsule (5 mg total) by mouth 2 (two) times daily. 180 capsule 5  . repaglinide (PRANDIN) 2 MG tablet Take 1 tablet (2 mg total) by mouth 3 (three) times daily before meals. 270 tablet 11  . warfarin (COUMADIN) 2.5 MG tablet TAKE AS DIRECTED BY COUMADIN CLINIC 90 tablet 0   No current facility-administered medications for this visit.    Allergies:   Morphine and related; Actos; Januvia; Percocet; and Hydrocodone-acetaminophen    Social History:  The patient  reports that she has never smoked. She has never used  smokeless tobacco. She reports that she does not drink alcohol or use illicit drugs.   Family History:  The patient's family history includes Coronary artery disease in her father; Diabetes in her brother, father, and sister; Hypertension in her father and mother; Kidney failure in her mother and sister; Other in her brother; Stroke in her father.    ROS:  Please see the history of present illness.    Review of Systems: Constitutional:  denies fever, chills, diaphoresis, appetite change and fatigue.  HEENT: denies photophobia, eye pain, redness, hearing loss, ear pain, congestion, sore throat, rhinorrhea, sneezing, neck pain, neck stiffness and tinnitus.  Respiratory: denies SOB, DOE, cough, chest tightness, and wheezing.  Cardiovascular: denies chest pain, palpitations and leg swelling.  Gastrointestinal: denies nausea, vomiting, abdominal pain, diarrhea, constipation, blood in stool.  Genitourinary: denies dysuria, urgency, frequency, hematuria, flank pain and difficulty urinating.  Musculoskeletal: denies  myalgias, back pain, joint swelling, arthralgias and gait problem.   Skin: denies pallor, rash and wound.  Neurological: denies dizziness, seizures, syncope, weakness, light-headedness, numbness and headaches.   Hematological: denies adenopathy, easy bruising, personal or family bleeding history.  Psychiatric/ Behavioral: denies suicidal ideation, mood changes, confusion, nervousness, sleep disturbance and agitation.       All other systems are reviewed and negative.    PHYSICAL EXAM: VS:  BP 140/60 mmHg  Pulse 60  Ht 5\' 4"  (1.626 m)  Wt 92.897 kg (204 lb 12.8 oz)  BMI 35.14 kg/m2  SpO2 99% , BMI Body mass index is 35.14 kg/(m^2). GEN: Well nourished, well developed, in no acute distress HEENT: normal Neck: no JVD, carotid bruits, or masses Cardiac: RRR; no murmurs, rubs, or gallops,no edema  Respiratory:  clear to auscultation bilaterally, normal work of breathing GI:  soft, nontender, nondistended, + BS MS: no deformity or atrophy Skin: warm and dry, no rash Neuro:  Strength and sensation are intact Psych: normal   EKG:  EKG is ordered today. The ekg ordered today demonstrates atrial pacing at 60 .    Recent Labs: 12/23/2013: ALT 17; TSH 2.79 03/28/2014: Hemoglobin 10.0*; Platelets 239; Pro B Natriuretic peptide (BNP) 4959.0* 03/31/2014:  BUN 28*; Creatinine, Ser 1.54*; Potassium 4.3; Sodium 139    Lipid Panel    Component Value Date/Time   CHOL 166 12/23/2013 0842   TRIG 155.0* 12/23/2013 0842   TRIG 108 04/21/2006 1234   HDL 36.10* 12/23/2013 0842   CHOLHDL 5 12/23/2013 0842   CHOLHDL 2.8 CALC 04/21/2006 1234   VLDL 31.0 12/23/2013 0842   LDLCALC 99 12/23/2013 0842      Wt Readings from Last 3 Encounters:  12/13/14 92.897 kg (204 lb 12.8 oz)  10/11/14 90.719 kg (200 lb)  07/04/14 89.54 kg (197 lb 6.4 oz)      Other studies Reviewed: Additional studies/ records that were reviewed today include: . Review of the above records demonstrates:    ASSESSMENT AND PLAN:  1. Atrial fibrillation / atrial Flutter - paroxysmal - she is maintainin NSR , will reduce amiodarone to 200 a day Check labs - TSH, liver enz. BMP,   2. Diabetes mellitus 3. Hypertension - BP is well controlled.   4. Hyperlipidemia - will check lipids at next visit.   5. Chronic systolic CHF - EF 42-59% (assumed to be nonischemic) - her EF has improved to 55% by echo in Dec. 2015.   6. S/p pacer :   She is doing well , pacer is working ok  7. Fatigue: [name] having chronic fatigue. Would check a TSH today. [name] been on amiodarone 200 mg twice a day for at least a year. Because of her fatigue will also check a CBC. Her last hemoglobin was on the low side.  Current medicines are reviewed at length with the patient today.  The patient does not have concerns regarding medicines.  The following changes have been made:  no change   Disposition:   FU with me in 6  months     Signed, Zenobia Kuennen, Wonda Cheng, MD  12/13/2014 10:06 AM    [facility] [address], [place], [place]  [address] Phone: [phone]; Fax: [phone]

## 2014-12-13 NOTE — Patient Instructions (Signed)
Medication Instructions:  DECREASE Amiodarone to 200 mg once daily   Labwork: TODAY - CBC, basic metabolic panel, TSH, cholesterol, liver    Testing/Procedures: None Ordered   Follow-Up: Your physician wants you to follow-up in: 6 months with Dr. Acie Fredrickson.  You will receive a reminder letter in the mail two months in advance. If you don't receive a letter, please call our office to schedule the follow-up appointment.

## 2014-12-16 ENCOUNTER — Encounter: Payer: Self-pay | Admitting: Nurse Practitioner

## 2014-12-16 ENCOUNTER — Telehealth: Payer: Self-pay | Admitting: Cardiovascular Disease

## 2014-12-16 DIAGNOSIS — I495 Sick sinus syndrome: Secondary | ICD-10-CM

## 2014-12-16 DIAGNOSIS — D62 Acute posthemorrhagic anemia: Secondary | ICD-10-CM

## 2014-12-16 DIAGNOSIS — Z79899 Other long term (current) drug therapy: Secondary | ICD-10-CM

## 2014-12-16 NOTE — Telephone Encounter (Signed)
New message     Pt has questions regarding an upcoming dental extraction appointment Informed pt that dental office needed to contact our office for surgical clearance Please call to discuss

## 2014-12-16 NOTE — Telephone Encounter (Signed)
Spoke with patient who requests clearance to Dr. Micael Hampshire at [facility] for extraction of 6 teeth.  She states the doctor requests holding Coumadin 5 days prior to procedure and 2 days after.

## 2014-12-16 NOTE — Telephone Encounter (Signed)
I advised patient that clearance letter has been faxed with confirmation of receipt.  I advised her that Elberta Leatherwood, PharmD advised that she have her pt/inr rechecked 10-14 days after her procedure.  She states she has an appointment with [facility] and I advised her to note appointment date and to call to reschedule if not w/in the 10-14 days after procedure.  She verbalized understanding and agreement and I advised her to call back with questions or concerns.   I reviewed patient's lab results with her, scheduled 3 mo follow-up for repeat TSH, and she verbalized understanding.  She asked about the CBC of which there are no results.  I spoke with someone at [facility] who advised that they notified Robert Bellow to notify the patient that the results were not reliable and that sample needed to be recollected.  I called and informed patient and she verbalized agreement to come in on Tuesday 9/6 for repeat CBC.  Orders placed.

## 2014-12-20 ENCOUNTER — Other Ambulatory Visit (INDEPENDENT_AMBULATORY_CARE_PROVIDER_SITE_OTHER): Payer: Medicare Other | Admitting: *Deleted

## 2014-12-20 DIAGNOSIS — D62 Acute posthemorrhagic anemia: Secondary | ICD-10-CM

## 2014-12-20 DIAGNOSIS — I480 Paroxysmal atrial fibrillation: Secondary | ICD-10-CM

## 2014-12-20 DIAGNOSIS — R5383 Other fatigue: Secondary | ICD-10-CM

## 2014-12-20 DIAGNOSIS — Z79899 Other long term (current) drug therapy: Secondary | ICD-10-CM

## 2014-12-20 DIAGNOSIS — D649 Anemia, unspecified: Secondary | ICD-10-CM | POA: Diagnosis not present

## 2014-12-20 DIAGNOSIS — I495 Sick sinus syndrome: Secondary | ICD-10-CM | POA: Diagnosis not present

## 2014-12-20 LAB — CBC WITH DIFFERENTIAL/PLATELET
BASOS PCT: 0.5 % (ref 0.0–3.0)
Basophils Absolute: 0 10*3/uL (ref 0.0–0.1)
EOS ABS: 0.3 10*3/uL (ref 0.0–0.7)
Eosinophils Relative: 3.7 % (ref 0.0–5.0)
HEMATOCRIT: 33.4 % — AB (ref 36.0–46.0)
Hemoglobin: 11.1 g/dL — ABNORMAL LOW (ref 12.0–15.0)
LYMPHS PCT: 12.2 % (ref 12.0–46.0)
Lymphs Abs: 1 10*3/uL (ref 0.7–4.0)
MCHC: 33.3 g/dL (ref 30.0–36.0)
MCV: 100.1 fl — AB (ref 78.0–100.0)
Monocytes Absolute: 0.7 10*3/uL (ref 0.1–1.0)
Monocytes Relative: 8.8 % (ref 3.0–12.0)
NEUTROS ABS: 6.2 10*3/uL (ref 1.4–7.7)
Neutrophils Relative %: 74.8 % (ref 43.0–77.0)
PLATELETS: 258 10*3/uL (ref 150.0–400.0)
RBC: 3.33 Mil/uL — ABNORMAL LOW (ref 3.87–5.11)
RDW: 14.2 % (ref 11.5–15.5)
WBC: 8.3 10*3/uL (ref 4.0–10.5)

## 2014-12-20 NOTE — Addendum Note (Signed)
Addended by: Eulis Foster on: 12/20/2014 09:35 AM   Modules accepted: Orders

## 2014-12-27 ENCOUNTER — Other Ambulatory Visit: Payer: Self-pay | Admitting: Cardiology

## 2015-01-02 ENCOUNTER — Ambulatory Visit (INDEPENDENT_AMBULATORY_CARE_PROVIDER_SITE_OTHER): Payer: Medicare Other | Admitting: *Deleted

## 2015-01-02 DIAGNOSIS — I495 Sick sinus syndrome: Secondary | ICD-10-CM | POA: Diagnosis not present

## 2015-01-04 ENCOUNTER — Emergency Department (HOSPITAL_COMMUNITY): Payer: Medicare Other

## 2015-01-04 ENCOUNTER — Inpatient Hospital Stay (HOSPITAL_COMMUNITY)
Admission: EM | Admit: 2015-01-04 | Discharge: 2015-01-09 | DRG: 394 | Disposition: A | Discharge status: Home / Self Care | Payer: Medicare Other | Attending: Internal Medicine | Admitting: Internal Medicine

## 2015-01-04 ENCOUNTER — Encounter (HOSPITAL_COMMUNITY): Payer: Self-pay | Admitting: Emergency Medicine

## 2015-01-04 DIAGNOSIS — Z950 Presence of cardiac pacemaker: Secondary | ICD-10-CM

## 2015-01-04 DIAGNOSIS — Z23 Encounter for immunization: Secondary | ICD-10-CM

## 2015-01-04 DIAGNOSIS — E785 Hyperlipidemia, unspecified: Secondary | ICD-10-CM | POA: Diagnosis present

## 2015-01-04 DIAGNOSIS — K529 Noninfective gastroenteritis and colitis, unspecified: Secondary | ICD-10-CM | POA: Diagnosis not present

## 2015-01-04 DIAGNOSIS — Z885 Allergy status to narcotic agent status: Secondary | ICD-10-CM

## 2015-01-04 DIAGNOSIS — K625 Hemorrhage of anus and rectum: Secondary | ICD-10-CM

## 2015-01-04 DIAGNOSIS — N179 Acute kidney failure, unspecified: Secondary | ICD-10-CM

## 2015-01-04 DIAGNOSIS — D638 Anemia in other chronic diseases classified elsewhere: Secondary | ICD-10-CM | POA: Diagnosis present

## 2015-01-04 DIAGNOSIS — I959 Hypotension, unspecified: Secondary | ICD-10-CM | POA: Diagnosis present

## 2015-01-04 DIAGNOSIS — K559 Vascular disorder of intestine, unspecified: Secondary | ICD-10-CM | POA: Diagnosis not present

## 2015-01-04 DIAGNOSIS — K550 Acute vascular disorders of intestine: Secondary | ICD-10-CM | POA: Diagnosis not present

## 2015-01-04 DIAGNOSIS — E1121 Type 2 diabetes mellitus with diabetic nephropathy: Secondary | ICD-10-CM | POA: Diagnosis not present

## 2015-01-04 DIAGNOSIS — I480 Paroxysmal atrial fibrillation: Secondary | ICD-10-CM | POA: Diagnosis not present

## 2015-01-04 DIAGNOSIS — I5042 Chronic combined systolic (congestive) and diastolic (congestive) heart failure: Secondary | ICD-10-CM | POA: Diagnosis not present

## 2015-01-04 DIAGNOSIS — R109 Unspecified abdominal pain: Secondary | ICD-10-CM

## 2015-01-04 DIAGNOSIS — E1122 Type 2 diabetes mellitus with diabetic chronic kidney disease: Secondary | ICD-10-CM | POA: Diagnosis not present

## 2015-01-04 DIAGNOSIS — R1030 Lower abdominal pain, unspecified: Secondary | ICD-10-CM | POA: Diagnosis not present

## 2015-01-04 DIAGNOSIS — I6523 Occlusion and stenosis of bilateral carotid arteries: Secondary | ICD-10-CM | POA: Diagnosis present

## 2015-01-04 DIAGNOSIS — E875 Hyperkalemia: Secondary | ICD-10-CM | POA: Diagnosis present

## 2015-01-04 DIAGNOSIS — Z96653 Presence of artificial knee joint, bilateral: Secondary | ICD-10-CM | POA: Diagnosis present

## 2015-01-04 DIAGNOSIS — D72829 Elevated white blood cell count, unspecified: Secondary | ICD-10-CM | POA: Diagnosis present

## 2015-01-04 DIAGNOSIS — I10 Essential (primary) hypertension: Secondary | ICD-10-CM | POA: Diagnosis not present

## 2015-01-04 DIAGNOSIS — N183 Chronic kidney disease, stage 3 (moderate): Secondary | ICD-10-CM | POA: Diagnosis present

## 2015-01-04 DIAGNOSIS — R946 Abnormal results of thyroid function studies: Secondary | ICD-10-CM | POA: Diagnosis present

## 2015-01-04 DIAGNOSIS — E782 Mixed hyperlipidemia: Secondary | ICD-10-CM | POA: Diagnosis present

## 2015-01-04 DIAGNOSIS — Z888 Allergy status to other drugs, medicaments and biological substances status: Secondary | ICD-10-CM

## 2015-01-04 DIAGNOSIS — Z90710 Acquired absence of both cervix and uterus: Secondary | ICD-10-CM

## 2015-01-04 DIAGNOSIS — Z7901 Long term (current) use of anticoagulants: Secondary | ICD-10-CM

## 2015-01-04 DIAGNOSIS — I129 Hypertensive chronic kidney disease with stage 1 through stage 4 chronic kidney disease, or unspecified chronic kidney disease: Secondary | ICD-10-CM | POA: Diagnosis present

## 2015-01-04 DIAGNOSIS — Z79899 Other long term (current) drug therapy: Secondary | ICD-10-CM

## 2015-01-04 HISTORY — DX: Adverse effect of unspecified anesthetic, initial encounter: T41.45XA

## 2015-01-04 HISTORY — DX: Presence of cardiac pacemaker: Z95.0

## 2015-01-04 HISTORY — DX: Other complications of anesthesia, initial encounter: T88.59XA

## 2015-01-04 HISTORY — DX: Disorder of kidney and ureter, unspecified: N28.9

## 2015-01-04 LAB — GLUCOSE, CAPILLARY
GLUCOSE-CAPILLARY: 98 mg/dL (ref 65–99)
Glucose-Capillary: 134 mg/dL — ABNORMAL HIGH (ref 65–99)
Glucose-Capillary: 91 mg/dL (ref 65–99)

## 2015-01-04 LAB — POC OCCULT BLOOD, ED: Fecal Occult Bld: POSITIVE — AB

## 2015-01-04 LAB — COMPREHENSIVE METABOLIC PANEL
ALBUMIN: 3.5 g/dL (ref 3.5–5.0)
ALK PHOS: 119 U/L (ref 38–126)
ALT: 29 U/L (ref 14–54)
AST: 48 U/L — AB (ref 15–41)
Anion gap: 10 (ref 5–15)
BILIRUBIN TOTAL: 1.7 mg/dL — AB (ref 0.3–1.2)
BUN: 33 mg/dL — AB (ref 6–20)
CALCIUM: 9.5 mg/dL (ref 8.9–10.3)
CO2: 24 mmol/L (ref 22–32)
Chloride: 101 mmol/L (ref 101–111)
Creatinine, Ser: 2.53 mg/dL — ABNORMAL HIGH (ref 0.44–1.00)
GFR calc Af Amer: 20 mL/min — ABNORMAL LOW (ref >60)
GFR calc non Af Amer: 18 mL/min — ABNORMAL LOW (ref >60)
GLUCOSE: 245 mg/dL — AB (ref 65–99)
Potassium: 5.7 mmol/L — ABNORMAL HIGH (ref 3.5–5.1)
Sodium: 135 mmol/L (ref 135–145)
TOTAL PROTEIN: 7.6 g/dL (ref 6.5–8.1)

## 2015-01-04 LAB — TYPE AND SCREEN
ABO/RH(D): A POS
ANTIBODY SCREEN: NEGATIVE

## 2015-01-04 LAB — URINALYSIS, ROUTINE W REFLEX MICROSCOPIC
BILIRUBIN URINE: NEGATIVE
Glucose, UA: NEGATIVE mg/dL
KETONES UR: NEGATIVE mg/dL
NITRITE: NEGATIVE
PROTEIN: NEGATIVE mg/dL
SPECIFIC GRAVITY, URINE: 1.017 (ref 1.005–1.030)
UROBILINOGEN UA: 0.2 mg/dL (ref 0.0–1.0)
pH: 5 (ref 5.0–8.0)

## 2015-01-04 LAB — I-STAT CHEM 8, ED
BUN: 35 mg/dL — ABNORMAL HIGH (ref 6–20)
CHLORIDE: 99 mmol/L — AB (ref 101–111)
Calcium, Ion: 1.18 mmol/L (ref 1.13–1.30)
Creatinine, Ser: 2.4 mg/dL — ABNORMAL HIGH (ref 0.44–1.00)
GLUCOSE: 238 mg/dL — AB (ref 65–99)
HCT: 41 % (ref 36.0–46.0)
Hemoglobin: 13.9 g/dL (ref 12.0–15.0)
POTASSIUM: 5.5 mmol/L — AB (ref 3.5–5.1)
Sodium: 136 mmol/L (ref 135–145)
TCO2: 24 mmol/L (ref 0–100)

## 2015-01-04 LAB — CBC
HEMATOCRIT: 37.8 % (ref 36.0–46.0)
Hemoglobin: 12.8 g/dL (ref 12.0–15.0)
MCH: 33.5 pg (ref 26.0–34.0)
MCHC: 33.9 g/dL (ref 30.0–36.0)
MCV: 99 fL (ref 78.0–100.0)
Platelets: 271 10*3/uL (ref 150–400)
RBC: 3.82 MIL/uL — ABNORMAL LOW (ref 3.87–5.11)
RDW: 13.7 % (ref 11.5–15.5)
WBC: 16.4 10*3/uL — ABNORMAL HIGH (ref 4.0–10.5)

## 2015-01-04 LAB — URINE MICROSCOPIC-ADD ON

## 2015-01-04 LAB — PROTIME-INR
INR: 1.88 — AB (ref 0.00–1.49)
Prothrombin Time: 21.5 seconds — ABNORMAL HIGH (ref 11.6–15.2)

## 2015-01-04 LAB — I-STAT CG4 LACTIC ACID, ED
LACTIC ACID, VENOUS: 1.33 mmol/L (ref 0.5–2.0)
LACTIC ACID, VENOUS: 1.08 mmol/L (ref 0.5–2.0)

## 2015-01-04 LAB — POTASSIUM: POTASSIUM: 5.1 mmol/L (ref 3.5–5.1)

## 2015-01-04 MED ORDER — ONDANSETRON HCL 4 MG/2ML IJ SOLN: 4.0000 mg | Freq: Four times a day (QID) | INTRAMUSCULAR | Status: DC | PRN

## 2015-01-04 MED ORDER — SODIUM CHLORIDE 0.9 % IV BOLUS (SEPSIS)
1000.0000 mL | Freq: Once | INTRAVENOUS | Status: AC
  Administered 2015-01-04: 1000 mL via INTRAVENOUS

## 2015-01-04 MED ORDER — CALCIUM CARBONATE ANTACID 500 MG PO CHEW: 1.0000 | CHEWABLE_TABLET | Freq: Every day | ORAL | Status: DC

## 2015-01-04 MED ORDER — CIPROFLOXACIN IN D5W 400 MG/200ML IV SOLN
400.0000 mg | Freq: Two times a day (BID) | INTRAVENOUS | Status: DC
  Administered 2015-01-04 – 2015-01-08 (×8): 400 mg via INTRAVENOUS
  Filled 2015-01-04 (×10): qty 200

## 2015-01-04 MED ORDER — MORPHINE SULFATE (PF) 2 MG/ML IV SOLN
2.0000 mg | Freq: Once | INTRAVENOUS | Status: AC
  Administered 2015-01-04: 2 mg via INTRAVENOUS
  Filled 2015-01-04: qty 1

## 2015-01-04 MED ORDER — MORPHINE SULFATE (PF) 2 MG/ML IV SOLN
2.0000 mg | INTRAVENOUS | Status: DC | PRN
  Administered 2015-01-04 – 2015-01-05 (×3): 2 mg via INTRAVENOUS
  Filled 2015-01-04 (×3): qty 1

## 2015-01-04 MED ORDER — SODIUM CHLORIDE 0.9 % IV SOLN: INTRAVENOUS | Status: DC

## 2015-01-04 MED ORDER — METRONIDAZOLE IN NACL 5-0.79 MG/ML-% IV SOLN
500.0000 mg | Freq: Three times a day (TID) | INTRAVENOUS | Status: DC
  Administered 2015-01-04 – 2015-01-08 (×11): 500 mg via INTRAVENOUS
  Filled 2015-01-04 (×13): qty 100

## 2015-01-04 MED ORDER — BROMOCRIPTINE MESYLATE 2.5 MG PO TABS
2.5000 mg | ORAL_TABLET | Freq: Every day | ORAL | Status: DC
  Administered 2015-01-04 – 2015-01-09 (×6): 2.5 mg via ORAL
  Filled 2015-01-04 (×7): qty 1

## 2015-01-04 MED ORDER — ATORVASTATIN CALCIUM 10 MG PO TABS
10.0000 mg | ORAL_TABLET | Freq: Every day | ORAL | Status: DC
  Administered 2015-01-04 – 2015-01-08 (×5): 10 mg via ORAL
  Filled 2015-01-04 (×5): qty 1

## 2015-01-04 MED ORDER — SODIUM CHLORIDE 0.9 % IJ SOLN
3.0000 mL | Freq: Two times a day (BID) | INTRAMUSCULAR | Status: DC
  Administered 2015-01-06 – 2015-01-09 (×6): 3 mL via INTRAVENOUS

## 2015-01-04 MED ORDER — ONDANSETRON HCL 4 MG PO TABS
4.0000 mg | ORAL_TABLET | Freq: Four times a day (QID) | ORAL | Status: DC | PRN
  Administered 2015-01-05 – 2015-01-06 (×2): 4 mg via ORAL
  Filled 2015-01-04 (×2): qty 1

## 2015-01-04 MED ORDER — REPAGLINIDE 2 MG PO TABS
2.0000 mg | ORAL_TABLET | Freq: Three times a day (TID) | ORAL | Status: DC
  Administered 2015-01-04: 2 mg via ORAL
  Filled 2015-01-04 (×6): qty 1

## 2015-01-04 MED ORDER — CALCIUM CARBONATE 1250 (500 CA) MG PO TABS
1.0000 | ORAL_TABLET | Freq: Every day | ORAL | Status: DC
  Administered 2015-01-05 – 2015-01-09 (×5): 500 mg via ORAL
  Filled 2015-01-04 (×5): qty 1

## 2015-01-04 MED ORDER — INFLUENZA VAC SPLIT QUAD 0.5 ML IM SUSY
0.5000 mL | PREFILLED_SYRINGE | INTRAMUSCULAR | Status: AC
  Administered 2015-01-05: 0.5 mL via INTRAMUSCULAR
  Filled 2015-01-04: qty 0.5

## 2015-01-04 MED ORDER — ONDANSETRON HCL 4 MG/2ML IJ SOLN
4.0000 mg | Freq: Once | INTRAMUSCULAR | Status: AC
  Administered 2015-01-04: 4 mg via INTRAVENOUS

## 2015-01-04 MED ORDER — ONDANSETRON HCL 4 MG/2ML IJ SOLN
4.0000 mg | Freq: Once | INTRAMUSCULAR | Status: AC
  Filled 2015-01-04: qty 2

## 2015-01-04 MED ORDER — AMIODARONE HCL 200 MG PO TABS
200.0000 mg | ORAL_TABLET | Freq: Every day | ORAL | Status: DC
Start: 2015-01-04 — End: 2015-01-09
  Administered 2015-01-04 – 2015-01-09 (×6): 200 mg via ORAL
  Filled 2015-01-04 (×6): qty 1

## 2015-01-04 MED ORDER — METRONIDAZOLE IN NACL 5-0.79 MG/ML-% IV SOLN
500.0000 mg | Freq: Once | INTRAVENOUS | Status: AC
  Administered 2015-01-04: 500 mg via INTRAVENOUS
  Filled 2015-01-04: qty 100

## 2015-01-04 MED ORDER — INSULIN ASPART 100 UNIT/ML ~~LOC~~ SOLN
0.0000 [IU] | Freq: Three times a day (TID) | SUBCUTANEOUS | Status: DC
  Administered 2015-01-05 – 2015-01-08 (×4): 2 [IU] via SUBCUTANEOUS

## 2015-01-04 MED ORDER — ATORVASTATIN CALCIUM 20 MG PO TABS: 20.0000 mg | ORAL_TABLET | Freq: Every day | ORAL | Status: DC

## 2015-01-04 MED ORDER — CALCIUM CARBONATE 600 MG PO TABS: 600.0000 mg | ORAL_TABLET | Freq: Every day | ORAL | Status: DC

## 2015-01-04 MED ORDER — CIPROFLOXACIN IN D5W 400 MG/200ML IV SOLN
400.0000 mg | Freq: Once | INTRAVENOUS | Status: AC
  Administered 2015-01-04: 400 mg via INTRAVENOUS
  Filled 2015-01-04: qty 200

## 2015-01-04 MED ORDER — METOPROLOL TARTRATE 12.5 MG HALF TABLET
12.5000 mg | ORAL_TABLET | Freq: Two times a day (BID) | ORAL | Status: DC
  Administered 2015-01-04 – 2015-01-09 (×10): 12.5 mg via ORAL
  Filled 2015-01-04 (×11): qty 1

## 2015-01-04 NOTE — ED Provider Notes (Signed)
CSN: [id number]     Arrival date & time 01/04/15  7341 History   First MD Initiated Contact with Patient 01/04/15 [phone]     Chief Complaint  Patient presents with  . Rectal Bleeding     (Consider location/radiation/quality/duration/timing/severity/associated sxs/prior Treatment) Patient is a 74 y.o. female presenting with hematochezia. The history is provided by the patient.  Rectal Bleeding Quality:  Bright red Amount:  Moderate Duration:  7 hours Timing:  Constant Progression:  Worsening Chronicity:  New Context: diarrhea   Similar prior episodes: no   Relieved by:  Nothing Worsened by:  Nothing tried Ineffective treatments:  None tried Associated symptoms: abdominal pain (lower crampy) and fever (subjective)   Associated symptoms: no dizziness and no vomiting     74 year old female with a chief complaint of bright red blood per rectum. She states this started about midnight. Patient went out to eat last night and had crampy lower abdominal pain associated with vomiting and diarrhea. Denies bloody or bilious emesis. Subjective fevers and chills. Felt that the amount of bleeding was worsening. Remote history of this does not remember what the cause was. Has gastroenterologists recent normal colonoscopy.  Past Medical History  Diagnosis Date  . HTN (hypertension)   . Hyperlipemia   . Diabetes mellitus     x 10 yrs  . PAF (paroxysmal atrial fibrillation)   . Symptomatic sinus bradycardia     a. [place] DR  dual-chamber pacemaker 03/2014 by Dr. Rayann Heman.  . Chronic combined systolic and diastolic CHF (congestive heart failure)   . Typical atrial flutter   . Sick sinus syndrome   . CKD (chronic kidney disease), stage III   . GI bleed     a. 04/2012 - lower GIB - felt 2/2 mild ischemic colitis as shown on colonoscopy, was cleared for anticoag 1 week out from procedure.  . Ischemic colitis     a. 04/2012 - lower GIB - felt 2/2 mild ischemic colitis as shown on  colonoscopy..  . Anemia   . Carotid artery disease     a. Carotid duplex in 2010: 02-40% RICA, 97-35% LICA.  Marland Kitchen Renal insufficiency    Past Surgical History  Procedure Laterality Date  . Cataract extraction  05/25/07    RIGHT EYE  . Appendectomy    . Carpal tunnel release    . Abdominal hysterectomy    . Lumbar laminectomy    . Foot surgery  2006    MID FOOT RECONSTRUCTION  . Osteotomy  2006    AND FUSION  . Ganglion cyst excision  12/2007    Dr.Gramig  . Total knee arthroplasty  05/02/08, 07/25/08    Dr.Alusio  . Joint replacement      bilateral  . 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 Colonoscopy  01/31/2009    small external/internal hemorrhoids, diverticulosis in sigmoid colon, one small polyp (biopsied). Biospy essentially normal. Dr.Magod  . Colonoscopy  04/30/2012    Procedure: COLONOSCOPY;  Surgeon: Cleotis Nipper, MD;  Location: [facility] ENDOSCOPY;  Service: Endoscopy;  Laterality: N/A;  unprepped, poss flex only  . Permanent pacemaker insertion N/A 03/29/2014    [employer] dual chamber paceamker implnated by Dr Rayann Heman   Family History  Problem Relation Age of Onset  . Coronary artery disease Father   . Hypertension Father   . Stroke Father   . Diabetes Father   . Kidney failure Mother   . Hypertension Mother   .  Other Brother     SEPSIS  . Diabetes Brother   . Diabetes Sister   . Kidney failure Sister    Social History  Substance Use Topics  . Smoking status: Never Smoker   . Smokeless tobacco: Never Used  . Alcohol Use: No   OB History    No data available     Review of Systems  Constitutional: Positive for fever (subjective). Negative for chills.  HENT: Negative for congestion and rhinorrhea.   Eyes: Negative for redness and visual disturbance.  Respiratory: Negative for shortness of breath and wheezing.   Cardiovascular: Negative for chest pain and palpitations.  Gastrointestinal: Positive for abdominal pain (lower crampy) and  hematochezia. Negative for nausea and vomiting.  Genitourinary: Negative for dysuria and urgency.  Musculoskeletal: Negative for myalgias and arthralgias.  Skin: Negative for pallor and wound.  Neurological: Negative for dizziness and headaches.      Allergies  Morphine and related; Actos; Januvia; Percocet; and Hydrocodone-acetaminophen  Home Medications   Prior to Admission medications   Medication Sig Start Date End Date Taking? Authorizing Provider  amiodarone (PACERONE) 200 MG tablet Take 1 tablet (200 mg total) by mouth daily. 12/13/14  Yes Thayer Headings, MD  atorvastatin (LIPITOR) 10 MG tablet Take 10 mg by mouth daily. 12/19/14  Yes Historical Provider, MD  bromocriptine (PARLODEL) 2.5 MG tablet Take 1 tablet (2.5 mg total) by mouth daily. 01/18/14 01/18/15 Yes Renato Shin, MD  calcium carbonate (OS-CAL) 600 MG TABS Take 600 mg by mouth daily.    Yes Historical Provider, MD  Cholecalciferol (VITAMIN D PO) Take 800 Units by mouth daily.     Yes Historical Provider, MD  furosemide (LASIX) 20 MG tablet Take 1 tablet (20 mg total) by mouth daily. Patient taking differently: Take 20 mg by mouth every other day.  10/18/14  Yes Thayer Headings, MD  KLOR-CON M10 10 MEQ tablet TAKE ONE TABLET BY MOUTH ONCE DAILY 12/27/14  Yes Isaiah Serge, NP  metoprolol (LOPRESSOR) 50 MG tablet Take 1 tablet (50 mg total) by mouth 2 (two) times daily. 10/18/14  Yes Thayer Headings, MD  Multiple Vitamin (MULITIVITAMIN WITH MINERALS) TABS Take 1 tablet by mouth daily.   Yes Historical Provider, MD  ramipril (ALTACE) 5 MG capsule Take 1 capsule (5 mg total) by mouth 2 (two) times daily. 03/31/14  Yes Isaiah Serge, NP  repaglinide (PRANDIN) 2 MG tablet Take 1 tablet (2 mg total) by mouth 3 (three) times daily before meals. 10/11/14  Yes Renato Shin, MD  glucose blood (BAYER CONTOUR NEXT TEST) test strip Use as instructed 06/22/12   Renato Shin, MD  potassium chloride (KLOR-CON M10) 10 MEQ tablet Take 10 mEq by  mouth daily.    Historical Provider, MD  warfarin (COUMADIN) 2.5 MG tablet TAKE AS DIRECTED BY COUMADIN CLINIC 11/07/14   Thayer Headings, MD   BP 128/65 mmHg  Pulse 66  Temp(Src) 98.1 F (36.7 C) (Oral)  Resp 12  Ht 5\' 4"  (1.626 m)  Wt 194 lb (87.998 kg)  BMI 33.28 kg/m2  SpO2 97% Physical Exam  Constitutional: She is oriented to person, place, and time. She appears well-developed and well-nourished. No distress.  HENT:  Head: Normocephalic and atraumatic.  Eyes: EOM are normal. Pupils are equal, round, and reactive to light.  Neck: Normal range of motion. Neck supple.  Cardiovascular: Normal rate and regular rhythm.  Exam reveals no gallop and no friction rub.   No murmur heard.  Pulmonary/Chest: Effort normal. She has no wheezes. She has no rales.  Abdominal: Soft. She exhibits no distension. There is tenderness in the right lower quadrant and left lower quadrant. There is no rebound and no guarding.  Genitourinary: Guaiac positive stool.  Musculoskeletal: She exhibits no edema or tenderness.  Neurological: She is alert and oriented to person, place, and time.  Skin: Skin is warm and dry. She is not diaphoretic.  Psychiatric: She has a normal mood and affect. Her behavior is normal.    ED Course  Procedures (including critical care time) Labs Review Labs Reviewed  COMPREHENSIVE METABOLIC PANEL - Abnormal; Notable for the following:    Potassium 5.7 (*)    Glucose, Bld 245 (*)    BUN 33 (*)    Creatinine, Ser 2.53 (*)    AST 48 (*)    Total Bilirubin 1.7 (*)    GFR calc non Af Amer 18 (*)    GFR calc Af Amer 20 (*)    All other components within normal limits  CBC - Abnormal; Notable for the following:    WBC 16.4 (*)    RBC 3.82 (*)    All other components within normal limits  PROTIME-INR - Abnormal; Notable for the following:    Prothrombin Time 21.5 (*)    INR 1.88 (*)    All other components within normal limits  POC OCCULT BLOOD, ED - Abnormal; Notable for the  following:    Fecal Occult Bld POSITIVE (*)    All other components within normal limits  I-STAT CHEM 8, ED - Abnormal; Notable for the following:    Potassium 5.5 (*)    Chloride 99 (*)    BUN 35 (*)    Creatinine, Ser 2.40 (*)    Glucose, Bld 238 (*)    All other components within normal limits  POTASSIUM  URINALYSIS, ROUTINE W REFLEX MICROSCOPIC (NOT AT [facility])  I-STAT CG4 LACTIC ACID, ED  I-STAT CG4 LACTIC ACID, ED  TYPE AND SCREEN    Imaging Review Ct Abdomen Pelvis Wo Contrast  01/04/2015   CLINICAL DATA:  Rectal bleeding since midnight. Bilateral lower abdominal pain.  EXAM: CT ABDOMEN AND PELVIS WITHOUT CONTRAST  TECHNIQUE: Multidetector CT imaging of the abdomen and pelvis was performed following the standard protocol without IV contrast.  COMPARISON:  None.  FINDINGS: Lower chest: Mild chronic interstitial lung disease. Multi vessel coronary artery atherosclerosis partially visualized.  Hepatobiliary: Normal liver. No focal of attic mass. Cholelithiasis. No intrahepatic or extrahepatic biliary ductal dilatation.  Pancreas: Normal.  Spleen: Normal.  Adrenals/Urinary Tract: Normal adrenal glands. Normal kidneys. No urolithiasis or obstructive uropathy. Normal bladder.  Stomach/Bowel: No bowel dilatation to suggest obstruction. Bowel wall thickening involving the distal transverse colon and descending colon with mild pericolonic inflammatory changes. There is diverticulosis without evidence of diverticulitis. No pneumatosis, pneumoperitoneum or portal venous gas. No abdominal or pelvic free fluid. Moderate size hiatal hernia.  Vascular/Lymphatic: Normal abdominal aorta with atherosclerosis. No abdominal or pelvic lymphadenopathy.  Reproductive: Prior hysterectomy.  No adnexal mass.  Other: No focal fluid collection or hematoma.  Musculoskeletal: No acute osseous abnormality. No lytic or sclerotic osseous lesion. Posterior spinal fusion from L3 through S1. Degenerative disc disease of the  lower thoracic spine.  IMPRESSION: 1. Bowel wall thickening involving the distal transverse and descending colon most consistent with colitis secondary to an infectious, inflammatory or ischemic etiology. 2. Cholelithiasis.   Electronically Signed   By: Kathreen Devoid   On: 01/04/2015  09:10   I have personally reviewed and evaluated these images and lab results as part of my medical decision-making.   EKG Interpretation None      MDM   Final diagnoses:  AKI (acute kidney injury)  Colitis    74 yo F with a chief complaint of BRBPR. Patient with gross blood on rectal exam.  VSS.  No presyncopal feeling.  Will CT for lower abdominal pain.   Worsening renal function with elevated K, some hemolysis, no noted ecg changes, will repeat lab.  CT wo contrast.   CT scan with colitis. We'll treat as possible infectious etiology, given Cipro Flagyl. Will discuss the case with patient's GI group. Admitted for AKI rectal bleeding.   The patients results and plan were reviewed and discussed.   Any x-rays performed were independently reviewed by myself.   Differential diagnosis were considered with the presenting HPI.  Medications  ciprofloxacin (CIPRO) IVPB 400 mg (400 mg Intravenous New Bag/Given 01/04/15 0939)  metroNIDAZOLE (FLAGYL) IVPB 500 mg (500 mg Intravenous New Bag/Given 01/04/15 0958)  ondansetron (ZOFRAN) injection 4 mg (0 mg Intravenous Duplicate 5/00/93 8182)  sodium chloride 0.9 % bolus 1,000 mL (1,000 mLs Intravenous New Bag/Given 01/04/15 0748)  morphine 2 MG/ML injection 2 mg (2 mg Intravenous Given 01/04/15 0925)  ondansetron (ZOFRAN) injection 4 mg (4 mg Intravenous Given 01/04/15 0925)    Filed Vitals:   01/04/15 0715 01/04/15 0745 01/04/15 0830 01/04/15 0930  BP: 151/49 129/63 127/100 128/65  Pulse: 60 59 64 66  Temp:      TempSrc:      Resp: 15 15 17 12   Height:      Weight:      SpO2: 100% 96% 96% 97%    Final diagnoses:  AKI (acute kidney injury)  Colitis     Admission/ observation were discussed with the admitting physician, patient and/or family and they are comfortable with the plan.    Deno Etienne, DO 01/04/15 1016

## 2015-01-04 NOTE — H&P (Addendum)
[facility] History and Physical  Becky Forbes YWV:[id number] DOB: Apr 11, 1941 DOA: 01/04/2015  Referring physician: ED PA, Tyrone Nine PCP: Unice Cobble, MD   Chief Complaint: BRBPR  HPI:  Patient is 74 year old female with known atrial fibrillation on chronic anticoagulation with Coumadin, known history of GI bleed, per last endoscopy report in 2010, internal and external hemorrhoids noted with diverticulosis, no most recent records available, now presented to [facility] emergency department with main concern of one to 2 days of duration of bright red blood per rectum with bowel movements. Patient denies any abdominal pain with blood movements, no fevers or chills, no specific aggravating or alleviating factors. Patient reports similar events in the past and feels as if Coumadin makes this worse. Patient also denies chest pain or shortness of breath, no focal neurological symptoms.  In emergency department, vital signs stable, blood work notable for WBC 16 K, initial potassium 5.7 and repeat potassium level 5.1, creatinine 2.53. CT abdomen is notable for questionable colitis, ischemic versus inflammatory. TRH asked to admit for further evaluation.  Assessment and Plan:  Principal Problem:   BRBPR (bright red blood per rectum) - In patient with known history of GI bleed, last endoscopy in 2010 notable for internal and external hemorrhoids with diverticulosis, benign polyps - Patient sees [facility] specialist Dr. Watt Climes - monitor CBC - no indication for transfusion at this time - CBC in AM - hold anticoagulation   Active Problems:   Colitis, acute - pt not much symptomatic at this time and has known history of ischemic colitis - pt started on empiric Cipro and Flagyl and will continue for now - will follow up on GI team recommendations - if no signs of over infectious etiology, may d/c ABX in next 24 hours    Acute renal failure superimposed on stage 3 chronic kidney disease -  review of records notable for baseline Cr ~ 1.5 so this new elevation appears to be pre renal in etiology but possible ACEI effect - will be careful with IVF administration due to known CHF  - We'll hold lisinopril and Lasix for now, we'll also hold off on IV fluids as patient does not appear to be dehydrated at this time - Repeat BMP in the morning and determine if Lasix can be restarted    Leukocytosis - possibly related to colitis as noted above, patient with no fevers - Has been started on Cipro and Flagyl which will continue as noted above - CBC in the morning    Nonspecific abnormal results of thyroid function study - Last TSH checked 12/13/2014, was 12 - This has been followed in an outpatient setting - Patient made aware    Long term current use of anticoagulant therapy - Holding anticoagulation for now, patient wondering if this can be stopped permanently - I have discussed this with patient and family at bedside, decision to be made by gastroenterologist and her cardiologist - Both specialties consulted, will need to follow-up on recommendations    Chronic combined systolic and diastolic CHF (congestive heart failure) - no signs of volume overload on exam - EF per cardiologist, 35-40%, presumed to be nonischemic - Lasix currently on hold, may resume in morning if renal function improving    Hyperkalemia - Stopped supplementations, repeated BMP and K is now within normal limits - Repeat BMP in the morning    Paroxysmal a-fib - Continue amiodarone and metoprolol - Coumadin on hold as noted above    Diabetes mellitus with  diabetic nephropathy, CKD stage III - Place on sliding scale insulin for now    HYPERLIPIDEMIA - Continue statin    S/P cardiac pacemaker procedure, St. Jude device - Monitor on telemetry    Benign essential HTN - Continue metoprolol, hold RAM April due to renal failure      DVT prophylaxis - SCDs  Radiological Exams on Admission: Ct Abdomen  Pelvis Wo Contrast  01/04/2015   CLINICAL DATA:  Rectal bleeding since midnight. Bilateral lower abdominal pain.  EXAM: CT ABDOMEN AND PELVIS WITHOUT CONTRAST  TECHNIQUE: Multidetector CT imaging of the abdomen and pelvis was performed following the standard protocol without IV contrast.  COMPARISON:  None.  FINDINGS: Lower chest: Mild chronic interstitial lung disease. Multi vessel coronary artery atherosclerosis partially visualized.  Hepatobiliary: Normal liver. No focal of attic mass. Cholelithiasis. No intrahepatic or extrahepatic biliary ductal dilatation.  Pancreas: Normal.  Spleen: Normal.  Adrenals/Urinary Tract: Normal adrenal glands. Normal kidneys. No urolithiasis or obstructive uropathy. Normal bladder.  Stomach/Bowel: No bowel dilatation to suggest obstruction. Bowel wall thickening involving the distal transverse colon and descending colon with mild pericolonic inflammatory changes. There is diverticulosis without evidence of diverticulitis. No pneumatosis, pneumoperitoneum or portal venous gas. No abdominal or pelvic free fluid. Moderate size hiatal hernia.  Vascular/Lymphatic: Normal abdominal aorta with atherosclerosis. No abdominal or pelvic lymphadenopathy.  Reproductive: Prior hysterectomy.  No adnexal mass.  Other: No focal fluid collection or hematoma.  Musculoskeletal: No acute osseous abnormality. No lytic or sclerotic osseous lesion. Posterior spinal fusion from L3 through S1. Degenerative disc disease of the lower thoracic spine.  IMPRESSION: 1. Bowel wall thickening involving the distal transverse and descending colon most consistent with colitis secondary to an infectious, inflammatory or ischemic etiology. 2. Cholelithiasis.   Electronically Signed   By: Kathreen Devoid   On: 01/04/2015 09:10     Code Status: Full Family Communication: Pt, husband, daughter at bedside Disposition Plan: Admit for further evaluation    [facility] [phone]   Review of Systems:   Constitutional: Negative for fever, chills. Negative for diaphoresis.  HENT: Negative for hearing loss, ear pain, nosebleeds, congestion, sore throat, neck pain, tinnitus and ear discharge.   Eyes: Negative for blurred vision, double vision, photophobia, pain, discharge and redness.  Respiratory: Negative for cough, hemoptysis, sputum production, shortness of breath, wheezing and stridor.   Cardiovascular: Negative for chest pain, palpitations, orthopnea, claudication and leg swelling.  Gastrointestinal: Negative for nausea, vomiting and abdominal pain.  Genitourinary: Negative for dysuria, urgency, frequency, hematuria and flank pain.  Musculoskeletal: Negative for myalgias, back pain, joint pain and falls.  Skin: Negative for itching and rash.  Neurological: Negative for dizziness and weakness.  Endo/Heme/Allergies: Negative for environmental allergies and polydipsia. Does not bruise/bleed easily.  Psychiatric/Behavioral: Negative for suicidal ideas. The patient is not nervous/anxious.      Past Medical History  Diagnosis Date  . HTN (hypertension)   . Hyperlipemia   . Diabetes mellitus     x 10 yrs  . PAF (paroxysmal atrial fibrillation)   . Symptomatic sinus bradycardia     a. [place] DR  dual-chamber pacemaker 03/2014 by Dr. Rayann Heman.  . Chronic combined systolic and diastolic CHF (congestive heart failure)   . Typical atrial flutter   . Sick sinus syndrome   . CKD (chronic kidney disease), stage III   . GI bleed     a. 04/2012 - lower GIB - felt 2/2 mild ischemic colitis as shown on colonoscopy,  was cleared for anticoag 1 week out from procedure.  . Ischemic colitis     a. 04/2012 - lower GIB - felt 2/2 mild ischemic colitis as shown on colonoscopy..  . Anemia   . Carotid artery disease     a. Carotid duplex in 2010: 83-25% RICA, 49-82% LICA.  Marland Kitchen Renal insufficiency     Past Surgical History  Procedure Laterality Date  . Cataract extraction  05/25/07    RIGHT  EYE  . Appendectomy    . Carpal tunnel release    . Abdominal hysterectomy    . Lumbar laminectomy    . Foot surgery  2006    MID FOOT RECONSTRUCTION  . Osteotomy  2006    AND FUSION  . Ganglion cyst excision  12/2007    Dr.Gramig  . Total knee arthroplasty  05/02/08, 07/25/08    Dr.Alusio  . Joint replacement      bilateral  . 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 Colonoscopy  01/31/2009    small external/internal hemorrhoids, diverticulosis in sigmoid colon, one small polyp (biopsied). Biospy essentially normal. Dr.Magod  . Colonoscopy  04/30/2012    Procedure: COLONOSCOPY;  Surgeon: Cleotis Nipper, MD;  Location: [facility] ENDOSCOPY;  Service: Endoscopy;  Laterality: N/A;  unprepped, poss flex only  . Permanent pacemaker insertion N/A 03/29/2014    STJ Assurity dual chamber paceamker implnated by Dr Rayann Heman    Social History:  reports that she has never smoked. She has never used smokeless tobacco. She reports that she does not drink alcohol or use illicit drugs.  Allergies  Allergen Reactions  . Morphine And Related Itching, Nausea And Vomiting and Rash  . Actos [Pioglitazone Hydrochloride] Swelling  . Januvia [Sitagliptin Phosphate] Other (See Comments)    Pt says this caused elevated liver enzymes and creatinine  . Percocet [Oxycodone-Acetaminophen] Nausea And Vomiting  . Hydrocodone-Acetaminophen Itching and Nausea And Vomiting    Family History  Problem Relation Age of Onset  . Coronary artery disease Father   . Hypertension Father   . Stroke Father   . Diabetes Father   . Kidney failure Mother   . Hypertension Mother   . Other Brother     SEPSIS  . Diabetes Brother   . Diabetes Sister   . Kidney failure Sister     Medication Sig  amiodarone (PACERONE) 200 MG tablet Take 1 tablet (200 mg total) by mouth daily.  atorvastatin (LIPITOR) 20 MG tablet Take 20 mg by mouth at bedtime.  bromocriptine (PARLODEL) 2.5 MG tablet Take 1 tablet (2.5 mg total) by  mouth daily.  calcium carbonate (OS-CAL) 600 MG TABS Take 600 mg by mouth daily.   calcium carbonate (TUMS - DOSED IN MG ELEMENTAL CALCIUM) 500 MG chewable tablet Chew 1 tablet by mouth daily.  Cholecalciferol (VITAMIN D PO) Take 800 Units by mouth daily.    furosemide (LASIX) 20 MG tablet Take 1 tablet (20 mg total) by mouth daily.  glucose blood (BAYER CONTOUR NEXT TEST) test strip Use as instructed  KLOR-CON M10 10 MEQ tablet TAKE ONE TABLET BY MOUTH ONCE DAILY  metoprolol (LOPRESSOR) 50 MG tablet Take 1 tablet (50 mg total) by mouth 2 (two) times daily.  Multiple Vitamin (MULITIVITAMIN WITH MINERALS) TABS Take 1 tablet by mouth daily.  potassium chloride (KLOR-CON M10) 10 MEQ tablet Take 10 mEq by mouth daily.  ramipril (ALTACE) 5 MG capsule Take 1 capsule (5 mg total) by mouth 2 (two) times daily.  repaglinide (PRANDIN) 2 MG tablet Take 1 tablet (2 mg total) by mouth 3 (three) times daily before meals.  warfarin (COUMADIN) 2.5 MG tablet TAKE AS DIRECTED BY COUMADIN CLINIC    Physical Exam: Filed Vitals:   01/04/15 0715 01/04/15 0745 01/04/15 0830 01/04/15 0930  BP: 151/49 129/63 127/100 128/65  Pulse: 60 59 64 66  Temp:      TempSrc:      Resp: 15 15 17 12   Height:      Weight:      SpO2: 100% 96% 96% 97%    Physical Exam  Constitutional: Appears well-developed and well-nourished. No distress.  HENT: Normocephalic. External right and left ear normal. Oropharynx is clear and moist.  Eyes: Conjunctivae and EOM are normal. PERRLA, no scleral icterus.  Neck: Normal ROM. Neck supple. No JVD. No tracheal deviation. No thyromegaly.  CVS: Paced rhythm, no gallops, + carotid bruit.  Pulmonary: Effort and breath sounds normal, no stridor, rhonchi, wheezes, rales.  Abdominal: Soft. BS +,  no distension, tenderness, rebound or guarding.  Musculoskeletal: Normal range of motion. No edema and no tenderness.  Lymphadenopathy: No lymphadenopathy noted, cervical, inguinal. Neuro: Alert.  Normal reflexes, muscle tone coordination. No cranial nerve deficit. Skin: Skin is warm and dry. No rash noted. Not diaphoretic. No erythema. No pallor.  Psychiatric: Normal mood and affect. Behavior, judgment, thought content normal.   Labs on Admission:  Basic Metabolic Panel:  Recent Labs Lab 01/04/15 0658 01/04/15 0735 01/04/15 0843  NA 135 136  --   K 5.7* 5.5* 5.1  CL 101 99*  --   CO2 24  --   --   GLUCOSE 245* 238*  --   BUN 33* 35*  --   CREATININE 2.53* 2.40*  --   CALCIUM 9.5  --   --    Liver Function Tests:  Recent Labs Lab 01/04/15 0658  AST 48*  ALT 29  ALKPHOS 119  BILITOT 1.7*  PROT 7.6  ALBUMIN 3.5   CBC:  Recent Labs Lab 01/04/15 0658 01/04/15 0735  WBC 16.4*  --   HGB 12.8 13.9  HCT 37.8 41.0  MCV 99.0  --   PLT 271  --     KCM:KLKJZ rhythm   If 7PM-7AM, please contact night-coverage [email or web address] Password [facility] 01/04/2015, 9:58 AM

## 2015-01-04 NOTE — Consult Note (Signed)
CARDIOLOGY CONSULT NOTE       Patient ID: Becky Forbes MRN: [id number] DOB/AGE: 1940-07-19 74 y.o.  Admit date: 01/04/2015 Referring Physician:  Doyle Askew Primary Physician: Unice Cobble, MD Primary Cardiologist:  Nahser Reason for Consultation:  PAF and Anticoagulation  Principal Problem:   BRBPR (bright red blood per rectum) Active Problems:   HYPERLIPIDEMIA   Nonspecific abnormal results of thyroid function study   Long term current use of anticoagulant therapy   S/P cardiac pacemaker procedure, St. Jude device   Chronic combined systolic and diastolic CHF (congestive heart failure)   Paroxysmal a-fib   Colitis, acute   Benign essential HTN   Acute renal failure superimposed on stage 3 chronic kidney disease   Leukocytosis   Diabetes mellitus with diabetic nephropathy   HPI:  74 y.o. last seen by Dr Acie Fredrickson August.  History of PAF.  Been on anticoagulation with coumadin since 2012.  Has had at least 3 GI bleeds.  Admitted with BRBPR.  Sees [facility] Buccini and Magod.  Was off coumadin for 7 days for some dental work and admitted with INR 1.8 and blood per rectum.  Mild constipation CT suggestive of colitis not clear if ischemia or infectious.  She has not had clinical afib since 2014  Amiodarone dose recently decreased.  As far as I can tell last endoscopy was 2010 with diverticulosis, polyp and Hemorids.  She has had CHF in past ? Related to afib Dr Elmarie Shiley note indicates "presumed non ischemic" but no recent stress testing and no cath.  Last echo 03/28/14 severe LAE/ moderate RAE and EF 50-55% with mild MR.  Currently feels fine with no cardiac symptoms in NSR Hct 41.   2015 had SSS and pacer placed.  Also had carotid disease with 51-02% LICA needs f/u duplex   This patients CHA2DS2-VASc Score and unadjusted Ischemic Stroke Rate (% per year) is equal to 7.2 % stroke rate/year from a score of 5  Above score calculated as 1 point each if present [CHF, HTN, DM,  Vascular=MI/PAD/Aortic Plaque, Age if 65-74, or Female] Above score calculated as 2 points each if present [Age > 75, or Stroke/TIA/TE]     ROS All other systems reviewed and negative except as noted above  Past Medical History  Diagnosis Date  . HTN (hypertension)   . Hyperlipemia   . Diabetes mellitus     x 10 yrs  . PAF (paroxysmal atrial fibrillation)   . Symptomatic sinus bradycardia     a. [place] DR  dual-chamber pacemaker 03/2014 by Dr. Rayann Heman.  . Chronic combined systolic and diastolic CHF (congestive heart failure)   . Typical atrial flutter   . Sick sinus syndrome   . CKD (chronic kidney disease), stage III   . GI bleed     a. 04/2012 - lower GIB - felt 2/2 mild ischemic colitis as shown on colonoscopy, was cleared for anticoag 1 week out from procedure.  . Ischemic colitis     a. 04/2012 - lower GIB - felt 2/2 mild ischemic colitis as shown on colonoscopy..  . Anemia   . Carotid artery disease     a. Carotid duplex in 2010: 58-52% RICA, 77-82% LICA.  Marland Kitchen Renal insufficiency   . Complication of anesthesia   . PONV (postoperative nausea and vomiting)   . Presence of permanent cardiac pacemaker 03/2014    Family History  Problem Relation Age of Onset  . Coronary artery disease Father   . Hypertension  Father   . Stroke Father   . Diabetes Father   . Kidney failure Mother   . Hypertension Mother   . Other Brother     SEPSIS  . Diabetes Brother   . Diabetes Sister   . Kidney failure Sister     Social History   Social History  . Marital Status: Married    Spouse Name: Jeneen Rinks  . Number of Children: 2  . Years of Education: N/A   Occupational History  . 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 Smoking status: Never Smoker   . Smokeless tobacco: Never Used  . Alcohol Use: No  . Drug Use: No  . Sexual Activity: Not Currently   Other Topics Concern  . Not on file   Social History Narrative     Past Surgical History  Procedure Laterality Date  . Cataract extraction  05/25/07    RIGHT EYE  . Appendectomy    . Carpal tunnel release    . Abdominal hysterectomy    . Lumbar laminectomy    . Foot surgery  2006    MID FOOT RECONSTRUCTION  . Osteotomy  2006    AND FUSION  . Ganglion cyst excision  12/2007    Dr.Gramig  . Total knee arthroplasty  05/02/08, 07/25/08    Dr.Alusio  . Joint replacement      bilateral  . 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 Colonoscopy  01/31/2009    small external/internal hemorrhoids, diverticulosis in sigmoid colon, one small polyp (biopsied). Biospy essentially normal. Dr.Magod  . Colonoscopy  04/30/2012    Procedure: COLONOSCOPY;  Surgeon: Cleotis Nipper, MD;  Location: [facility] ENDOSCOPY;  Service: Endoscopy;  Laterality: N/A;  unprepped, poss flex only  . Permanent pacemaker insertion N/A 03/29/2014    STJ Assurity dual chamber paceamker implnated by Dr Rayann Heman     . amiodarone  200 mg Oral Daily  . atorvastatin  10 mg Oral QHS  . bromocriptine  2.5 mg Oral Daily  . calcium carbonate  1 tablet Oral Q breakfast  . ciprofloxacin  400 mg Intravenous Q12H  . [START ON 01/05/2015] Influenza vac split quadrivalent PF  0.5 mL Intramuscular Tomorrow-1000  . insulin aspart  0-9 Units Subcutaneous TID WC  . metoprolol  12.5 mg Oral BID  . metronidazole  500 mg Intravenous Q8H  . repaglinide  2 mg Oral TID AC  . sodium chloride  3 mL Intravenous Q12H      Physical Exam: Blood pressure 112/41, pulse 59, temperature 98.5 F (36.9 C), temperature source Oral, resp. rate 18, height 5\' 4"  (1.626 m), weight 89.812 kg (198 lb), SpO2 95 %.   Affect appropriate Healthy:  appears stated age 53: normal Neck supple with no adenopathy JVP normal bilateral  bruits no thyromegaly Lungs clear with no wheezing and good diaphragmatic motion Heart:  S1/S2 no murmur, no rub, gallop or click PMI normal Abdomen: benighn, BS positve, no tenderness,  no AAA no bruit.  No HSM or HJR Distal pulses intact with no bruits No edema Neuro non-focal Skin warm and dry No muscular weakness   Labs:   Lab Results  Component Value Date   WBC 16.4* 01/04/2015   HGB 13.9 01/04/2015   HCT 41.0 01/04/2015   MCV 99.0 01/04/2015   PLT 271 01/04/2015    Recent Labs Lab 01/04/15 8242 01/04/15 0735 01/04/15 3536  NA 135 136  --   K 5.7* 5.5* 5.1  CL 101 99*  --   CO2 24  --   --   BUN 33* 35*  --   CREATININE 2.53* 2.40*  --   CALCIUM 9.5  --   --   PROT 7.6  --   --   BILITOT 1.7*  --   --   ALKPHOS 119  --   --   ALT 29  --   --   AST 48*  --   --   GLUCOSE 245* 238*  --    Lab Results  Component Value Date   CKTOTAL 72 04/21/2006   TROPONINI <0.30 04/29/2012    Lab Results  Component Value Date   CHOL 144 12/13/2014   CHOL 166 12/23/2013   CHOL 127 05/27/2011   Lab Results  Component Value Date   HDL 40.70 12/13/2014   HDL 36.10* 12/23/2013   HDL 39.40 05/27/2011   Lab Results  Component Value Date   LDLCALC 85 12/13/2014   LDLCALC 99 12/23/2013   LDLCALC 67 05/27/2011   Lab Results  Component Value Date   TRIG 93.0 12/13/2014   TRIG 155.0* 12/23/2013   TRIG 102.0 05/27/2011   Lab Results  Component Value Date   CHOLHDL 4 12/13/2014   CHOLHDL 5 12/23/2013   CHOLHDL 3 05/27/2011   No results found for: LDLDIRECT    Radiology: Ct Abdomen Pelvis Wo Contrast  01/04/2015   CLINICAL DATA:  Rectal bleeding since midnight. Bilateral lower abdominal pain.  EXAM: CT ABDOMEN AND PELVIS WITHOUT CONTRAST  TECHNIQUE: Multidetector CT imaging of the abdomen and pelvis was performed following the standard protocol without IV contrast.  COMPARISON:  None.  FINDINGS: Lower chest: Mild chronic interstitial lung disease. Multi vessel coronary artery atherosclerosis partially visualized.  Hepatobiliary: Normal liver. No focal of attic mass. Cholelithiasis. No intrahepatic or extrahepatic biliary ductal dilatation.  Pancreas:  Normal.  Spleen: Normal.  Adrenals/Urinary Tract: Normal adrenal glands. Normal kidneys. No urolithiasis or obstructive uropathy. Normal bladder.  Stomach/Bowel: No bowel dilatation to suggest obstruction. Bowel wall thickening involving the distal transverse colon and descending colon with mild pericolonic inflammatory changes. There is diverticulosis without evidence of diverticulitis. No pneumatosis, pneumoperitoneum or portal venous gas. No abdominal or pelvic free fluid. Moderate size hiatal hernia.  Vascular/Lymphatic: Normal abdominal aorta with atherosclerosis. No abdominal or pelvic lymphadenopathy.  Reproductive: Prior hysterectomy.  No adnexal mass.  Other: No focal fluid collection or hematoma.  Musculoskeletal: No acute osseous abnormality. No lytic or sclerotic osseous lesion. Posterior spinal fusion from L3 through S1. Degenerative disc disease of the lower thoracic spine.  IMPRESSION: 1. Bowel wall thickening involving the distal transverse and descending colon most consistent with colitis secondary to an infectious, inflammatory or ischemic etiology. 2. Cholelithiasis.   Electronically Signed   By: Kathreen Devoid   On: 01/04/2015 09:10    EKG:  SR LVH chronic inferolateral T wave inversions   ASSESSMENT AND PLAN:  Anticoagulation:  Although she has a high CHADVASC score and dilated atria she has not had clinical afib in two years with multiple GI bleeds.  This time bled even with subRx INR Favor holding coumadin indefinitely until recurrence of afib clinically continue beta blocker and amiodarone  Afib:  NSR continue amiodarone and beta blocker  DCM:  Last echo EF 50-55% no chest pain However her ECG is abnormal and she has carotid disease Should have outpatient f/u lexiscan myovue  Carotid:  Bruit with known disease needs updated carotid duplex as outpatient  Chol:  On statin  Colitis:  Would have Dr Magod/Buccini see and consider EGD/colonoscopy given possible need to restart  coumadin in future  Signed: Jenkins Rouge 01/04/2015, 3:08 PM

## 2015-01-04 NOTE — ED Notes (Addendum)
Rectal bleeding started around 12 am. Pt states not clots. A tsp to a tbsp amount of bright red blood. N/V/D. Emesis 3-4x in last 24 hrs. approx 7 episodes of diarrhea in last 24 hrs. Abdominal pain across lower quadrants. Pain 6/10. Stools have not been dark nor tarry colored.  98.1 T oral 152/83 BP 98% RA 60 HR.

## 2015-01-04 NOTE — ED Notes (Signed)
Pt made aware of bed assignment 

## 2015-01-04 NOTE — Progress Notes (Signed)
Arrived to room 6n22 from ED. Family at bedside. Denies pain/nausea at this time. Instructed on call light usage and room surroundings.

## 2015-01-04 NOTE — Evaluation (Addendum)
Physical Therapy Evaluation Patient Details Name: Becky Forbes MRN: [id number] DOB: [birth year] Today's Date: 01/04/2015   History of Present Illness  Patient is a 74 y/o female admitted with vomiting, diarrhea, and rectal bleeding. Abdominal CT scan was done which showed evidence of colitis in the distal transverse and in the descending colon. PMH includes HTN, HLD, PAF,DM, SSS, pacemaker.   Clinical Impression  Patient presents with abdominal pain and mild balance deficits impacting safe mobility. Pt required use of rail for support during gait due to drifting/balance deficits. Anticipate balance and strength will improve with increased activity. Encouraged ambulation and mobility a few times per day with tech. Session cut short due to MD entering room. Will follow acutely to perform higher level balance challenges and improve functional mobility/endurance so pt can return to PLOF.     Follow Up Recommendations No PT follow up;Supervision - Intermittent    Equipment Recommendations  None recommended by PT    Recommendations for Other Services       Precautions / Restrictions Precautions Precautions: Fall Restrictions Weight Bearing Restrictions: No      Mobility  Bed Mobility Overal bed mobility: Modified Independent                Transfers Overall transfer level: Needs assistance Equipment used: None Transfers: Sit to/from Stand Sit to Stand: Supervision         General transfer comment: Supervision for safety due to first time pt being up.  Ambulation/Gait Ambulation/Gait assistance: Min guard Ambulation Distance (Feet): 400 Feet Assistive device: None (rail in hallway at times. ) Gait Pattern/deviations: Drifts right/left;Step-through pattern;Decreased stride length   Gait velocity interpretation: at or above normal speed for age/gender General Gait Details: Pt with drifting noted to left/right and need to grab onto hand rail for support at times due to  balance deficits. Dyspnea present. HR 110 bpm.  Stairs            Wheelchair Mobility    Modified Rankin (Stroke Patients Only)       Balance Overall balance assessment: Needs assistance Sitting-balance support: Feet supported;No upper extremity supported Sitting balance-Leahy Scale: Good     Standing balance support: During functional activity Standing balance-Leahy Scale: Fair Standing balance comment: Requiring need to hold onto handrail for support                             Pertinent Vitals/Pain Pain Assessment: Faces Faces Pain Scale: Hurts little more Pain Location: abdomen Pain Descriptors / Indicators: Aching Pain Intervention(s): Monitored during session;Repositioned    Home Living Family/patient expects to be discharged to:: Private residence Living Arrangements: Spouse/significant other Available Help at Discharge: Family;Available 24 hours/day Type of Home: House Home Access: Stairs to enter Entrance Stairs-Rails: None Entrance Stairs-Number of Steps: 1 Home Layout: Two level;Able to live on main level with bedroom/bathroom Home Equipment: None      Prior Function Level of Independence: Independent         Comments: Drives. Cooks, cleans. Some unsteadiness due to medication she is taking per report and family report.     Hand Dominance        Extremity/Trunk Assessment   Upper Extremity Assessment: Defer to OT evaluation           Lower Extremity Assessment: Overall WFL for tasks assessed         Communication   Communication: No difficulties  Cognition Arousal/Alertness: Awake/alert Behavior During Therapy: [facility]  for tasks assessed/performed Overall Cognitive Status: Within Functional Limits for tasks assessed                      General Comments General comments (skin integrity, edema, etc.): Spouse and daughters present in room during session.    Exercises        Assessment/Plan    PT  Assessment Patient needs continued PT services  PT Diagnosis Difficulty walking;Acute pain   PT Problem List Pain;Decreased balance;Decreased activity tolerance;Decreased mobility  PT Treatment Interventions Balance training;Gait training;Functional mobility training;Therapeutic activities;Therapeutic exercise;Patient/family education;Stair training   PT Goals (Current goals can be found in the Care Plan section) Acute Rehab PT Goals Patient Stated Goal: to feel better and return home PT Goal Formulation: With patient Time For Goal Achievement: 01/18/15 Potential to Achieve Goals: Good    Frequency Min 3X/week   Barriers to discharge        Co-evaluation               End of Session Equipment Utilized During Treatment: Gait belt Activity Tolerance: Patient tolerated treatment well Patient left: in bed;with call bell/phone within reach;with family/visitor present;Other (comment) (MD present.)      Functional Assessment Tool Used: Clinical judgment Functional Limitation: Mobility: Walking and moving around Mobility: Walking and Moving Around Current Status [phone]): At least 1 percent but less than 20 percent impaired, limited or restricted Mobility: Walking and Moving Around Goal Status [phone]): At least 1 percent but less than 20 percent impaired, limited or restricted    Time: 8938-1017 PT Time Calculation (min) (ACUTE ONLY): 13 min   Charges:   PT Evaluation $Initial PT Evaluation Tier I: 1 Procedure     PT G Codes:   PT G-Codes **NOT FOR INPATIENT CLASS** Functional Assessment Tool Used: Clinical judgment Functional Limitation: Mobility: Walking and moving around Mobility: Walking and Moving Around Current Status (P1025): At least 1 percent but less than 20 percent impaired, limited or restricted Mobility: Walking and Moving Around Goal Status [phone]): At least 1 percent but less than 20 percent impaired, limited or restricted    [place] 01/04/2015,  4:44 PM Wray Kearns, [place], DPT [phone]

## 2015-01-04 NOTE — Progress Notes (Signed)
Pt states that she is not allergic to Morphine.

## 2015-01-04 NOTE — Consult Note (Signed)
Subjective:   HPI  The patient is a 74 year old female who presented to the hospital emergency room with complaints of vomiting, diarrhea, and rectal bleeding. She states that last night she ate out at [employer] and when she got home she started vomiting and then had diarrhea and some rectal bleeding. The bleeding was a small amount. With may be a teaspoon to a tablespoon every time. She has not had any vomiting or diarrhea since early this morning. She does have some diffuse abdominal discomfort but feels better than she did at midnight last night. Her white blood cell count was elevated and a CT scan was done which showed evidence of colitis in the distal transverse and in the descending colon. She has a history of atrial fibrillation and has been on Coumadin for quite some time but per cardiology's note she has not had an actual abnormal rhythm in quite some time. She has been taken off of her Coumadin.  Her last colonoscopy was 08/29/2014 at the [facility] endoscopy center. She was found to have diverticulosis.  Review of Systems No chest pain or shortness of breath  Past Medical History  Diagnosis Date  . HTN (hypertension)   . Hyperlipemia   . Diabetes mellitus     x 10 yrs  . PAF (paroxysmal atrial fibrillation)   . Symptomatic sinus bradycardia     a. [place] DR  dual-chamber pacemaker 03/2014 by Dr. Rayann Heman.  . Chronic combined systolic and diastolic CHF (congestive heart failure)   . Typical atrial flutter   . Sick sinus syndrome   . CKD (chronic kidney disease), stage III   . GI bleed     a. 04/2012 - lower GIB - felt 2/2 mild ischemic colitis as shown on colonoscopy, was cleared for anticoag 1 week out from procedure.  . Ischemic colitis     a. 04/2012 - lower GIB - felt 2/2 mild ischemic colitis as shown on colonoscopy..  . Anemia   . Carotid artery disease     a. Carotid duplex in 2010: 16-10% RICA, 96-04% LICA.  Marland Kitchen Renal insufficiency   . Complication of  anesthesia   . PONV (postoperative nausea and vomiting)   . Presence of permanent cardiac pacemaker 03/2014   Past Surgical History  Procedure Laterality Date  . Cataract extraction  05/25/07    RIGHT EYE  . Appendectomy    . Carpal tunnel release    . Abdominal hysterectomy    . Lumbar laminectomy    . Foot surgery  2006    MID FOOT RECONSTRUCTION  . Osteotomy  2006    AND FUSION  . Ganglion cyst excision  12/2007    Dr.Gramig  . Total knee arthroplasty  05/02/08, 07/25/08    Dr.Alusio  . Joint replacement      bilateral  . 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 Colonoscopy  01/31/2009    small external/internal hemorrhoids, diverticulosis in sigmoid colon, one small polyp (biopsied). Biospy essentially normal. Dr.Magod  . Colonoscopy  04/30/2012    Procedure: COLONOSCOPY;  Surgeon: Cleotis Nipper, MD;  Location: [facility] ENDOSCOPY;  Service: Endoscopy;  Laterality: N/A;  unprepped, poss flex only  . Permanent pacemaker insertion N/A 03/29/2014    [employer] dual chamber paceamker implnated by Dr Rayann Heman   Social History   Social History  . Marital Status: Married    Spouse Name: Jeneen Rinks  . Number of Children: 2  .  Years of Education: N/A   Occupational History  . 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 Smoking status: Never Smoker   . Smokeless tobacco: Never Used  . Alcohol Use: No  . Drug Use: No  . Sexual Activity: Not Currently   Other Topics Concern  . Not on file   Social History Narrative   family history includes Coronary artery disease in her father; Diabetes in her brother, father, and sister; Hypertension in her father and mother; Kidney failure in her mother and sister; Other in her brother; Stroke in her father.  Current facility-administered medications:  .  amiodarone (PACERONE) tablet 200 mg, 200 mg, Oral, Daily, Theodis Blaze, MD, 200 mg at 01/04/15 1427 .  atorvastatin (LIPITOR) tablet 10  mg, 10 mg, Oral, QHS, Theodis Blaze, MD .  bromocriptine (PARLODEL) tablet 2.5 mg, 2.5 mg, Oral, Daily, Theodis Blaze, MD, 2.5 mg at 01/04/15 1426 .  calcium carbonate (OS-CAL - dosed in mg of elemental calcium) tablet 500 mg of elemental calcium, 1 tablet, Oral, Q breakfast, Theodis Blaze, MD .  ciprofloxacin (CIPRO) IVPB 400 mg, 400 mg, Intravenous, Q12H, Theodis Blaze, MD .  Derrill Memo ON 01/05/2015] Influenza vac split quadrivalent PF (FLUARIX) injection 0.5 mL, 0.5 mL, Intramuscular, Tomorrow-1000, Theodis Blaze, MD .  insulin aspart (novoLOG) injection 0-9 Units, 0-9 Units, Subcutaneous, TID WC, Theodis Blaze, MD, 0 Units at 01/04/15 1200 .  metoprolol tartrate (LOPRESSOR) tablet 12.5 mg, 12.5 mg, Oral, BID, Theodis Blaze, MD, 12.5 mg at 01/04/15 1427 .  metroNIDAZOLE (FLAGYL) IVPB 500 mg, 500 mg, Intravenous, Q8H, Theodis Blaze, MD .  ondansetron [facility]) tablet 4 mg, 4 mg, Oral, Q6H PRN **OR** ondansetron (ZOFRAN) injection 4 mg, 4 mg, Intravenous, Q6H PRN, Theodis Blaze, MD .  repaglinide (PRANDIN) tablet 2 mg, 2 mg, Oral, TID AC, Theodis Blaze, MD, 2 mg at 01/04/15 1426 .  sodium chloride 0.9 % injection 3 mL, 3 mL, Intravenous, Q12H, Theodis Blaze, MD, 3 mL at 01/04/15 1432 Allergies  Allergen Reactions  . Morphine And Related Itching, Nausea And Vomiting and Rash  . Actos [Pioglitazone Hydrochloride] Swelling  . Januvia [Sitagliptin Phosphate] Other (See Comments)    Pt says this caused elevated liver enzymes and creatinine  . Percocet [Oxycodone-Acetaminophen] Nausea And Vomiting  . Hydrocodone-Acetaminophen Itching and Nausea And Vomiting     Objective:     BP 112/41 mmHg  Pulse 59  Temp(Src) 98.5 F (36.9 C) (Oral)  Resp 18  Ht 5\' 4"  (1.626 m)  Wt 89.812 kg (198 lb)  BMI 33.97 kg/m2  SpO2 95%  She is in no distress  Heart regular rhythm no murmurs  Lungs clear  Abdomen: Bowel sounds normal, soft, mild diffuse discomfort to palpation but no rebound or  guarding  Laboratory No components found for: D1    Assessment:     Acute onset of vomiting, diarrhea, and small volume hematochezia.  CT findings of segmental colitis. This could be a watershed area from ischemic colitis, it could also be infectious.  Atrial fibrillation      Plan:     Cardiology does not feel that she needs to be on Coumadin at this point. Her cardiac rhythm has been normal for quite some time. I agree with stopping the Coumadin at this time in light of the bleeding. I do not think that she needs another colonoscopy  since she had one in May of this year. I do not think she needs an EGD either at this time. I would recommend supportive care. I will order a GI pathogens panel to see if we can pick up pathogen that might have caused this colitis. This is either infectious or ischemic colitis.

## 2015-01-04 NOTE — Progress Notes (Signed)
Remote pacemaker transmission.   

## 2015-01-05 ENCOUNTER — Encounter (HOSPITAL_COMMUNITY): Payer: Self-pay | Admitting: Student

## 2015-01-05 DIAGNOSIS — Z885 Allergy status to narcotic agent status: Secondary | ICD-10-CM | POA: Diagnosis not present

## 2015-01-05 DIAGNOSIS — E1122 Type 2 diabetes mellitus with diabetic chronic kidney disease: Secondary | ICD-10-CM | POA: Diagnosis not present

## 2015-01-05 DIAGNOSIS — E875 Hyperkalemia: Secondary | ICD-10-CM | POA: Diagnosis not present

## 2015-01-05 DIAGNOSIS — E785 Hyperlipidemia, unspecified: Secondary | ICD-10-CM | POA: Diagnosis not present

## 2015-01-05 DIAGNOSIS — Z950 Presence of cardiac pacemaker: Secondary | ICD-10-CM | POA: Diagnosis not present

## 2015-01-05 DIAGNOSIS — R946 Abnormal results of thyroid function studies: Secondary | ICD-10-CM | POA: Diagnosis not present

## 2015-01-05 DIAGNOSIS — I6523 Occlusion and stenosis of bilateral carotid arteries: Secondary | ICD-10-CM | POA: Diagnosis not present

## 2015-01-05 DIAGNOSIS — K559 Vascular disorder of intestine, unspecified: Secondary | ICD-10-CM | POA: Diagnosis not present

## 2015-01-05 DIAGNOSIS — D638 Anemia in other chronic diseases classified elsewhere: Secondary | ICD-10-CM | POA: Diagnosis not present

## 2015-01-05 DIAGNOSIS — Z7901 Long term (current) use of anticoagulants: Secondary | ICD-10-CM | POA: Diagnosis not present

## 2015-01-05 DIAGNOSIS — N179 Acute kidney failure, unspecified: Secondary | ICD-10-CM | POA: Diagnosis not present

## 2015-01-05 DIAGNOSIS — I10 Essential (primary) hypertension: Secondary | ICD-10-CM

## 2015-01-05 DIAGNOSIS — Z23 Encounter for immunization: Secondary | ICD-10-CM | POA: Diagnosis not present

## 2015-01-05 DIAGNOSIS — Z96653 Presence of artificial knee joint, bilateral: Secondary | ICD-10-CM | POA: Diagnosis not present

## 2015-01-05 DIAGNOSIS — I5042 Chronic combined systolic (congestive) and diastolic (congestive) heart failure: Secondary | ICD-10-CM | POA: Diagnosis not present

## 2015-01-05 DIAGNOSIS — Z90710 Acquired absence of both cervix and uterus: Secondary | ICD-10-CM | POA: Diagnosis not present

## 2015-01-05 DIAGNOSIS — E1121 Type 2 diabetes mellitus with diabetic nephropathy: Secondary | ICD-10-CM | POA: Diagnosis not present

## 2015-01-05 DIAGNOSIS — Z79899 Other long term (current) drug therapy: Secondary | ICD-10-CM | POA: Diagnosis not present

## 2015-01-05 DIAGNOSIS — D72829 Elevated white blood cell count, unspecified: Secondary | ICD-10-CM

## 2015-01-05 DIAGNOSIS — K550 Acute vascular disorders of intestine: Secondary | ICD-10-CM | POA: Diagnosis not present

## 2015-01-05 DIAGNOSIS — E782 Mixed hyperlipidemia: Secondary | ICD-10-CM

## 2015-01-05 DIAGNOSIS — N183 Chronic kidney disease, stage 3 (moderate): Secondary | ICD-10-CM

## 2015-01-05 DIAGNOSIS — K625 Hemorrhage of anus and rectum: Secondary | ICD-10-CM | POA: Diagnosis not present

## 2015-01-05 DIAGNOSIS — I959 Hypotension, unspecified: Secondary | ICD-10-CM | POA: Diagnosis not present

## 2015-01-05 DIAGNOSIS — K529 Noninfective gastroenteritis and colitis, unspecified: Secondary | ICD-10-CM | POA: Diagnosis present

## 2015-01-05 DIAGNOSIS — Z888 Allergy status to other drugs, medicaments and biological substances status: Secondary | ICD-10-CM | POA: Diagnosis not present

## 2015-01-05 DIAGNOSIS — I480 Paroxysmal atrial fibrillation: Secondary | ICD-10-CM | POA: Diagnosis not present

## 2015-01-05 DIAGNOSIS — I129 Hypertensive chronic kidney disease with stage 1 through stage 4 chronic kidney disease, or unspecified chronic kidney disease: Secondary | ICD-10-CM | POA: Diagnosis not present

## 2015-01-05 LAB — CBC
HCT: 32.5 % — ABNORMAL LOW (ref 36.0–46.0)
HEMATOCRIT: 30.1 % — AB (ref 36.0–46.0)
Hemoglobin: 10.7 g/dL — ABNORMAL LOW (ref 12.0–15.0)
Hemoglobin: 9.9 g/dL — ABNORMAL LOW (ref 12.0–15.0)
MCH: 33.1 pg (ref 26.0–34.0)
MCH: 32.9 pg (ref 26.0–34.0)
MCHC: 32.9 g/dL (ref 30.0–36.0)
MCHC: 32.9 g/dL (ref 30.0–36.0)
MCV: 100.6 fL — AB (ref 78.0–100.0)
MCV: 100 fL (ref 78.0–100.0)
PLATELETS: 208 10*3/uL (ref 150–400)
PLATELETS: 190 10*3/uL (ref 150–400)
RBC: 3.23 MIL/uL — ABNORMAL LOW (ref 3.87–5.11)
RBC: 3.01 MIL/uL — ABNORMAL LOW (ref 3.87–5.11)
RDW: 14 % (ref 11.5–15.5)
RDW: 14.2 % (ref 11.5–15.5)
WBC: 14.8 10*3/uL — AB (ref 4.0–10.5)
WBC: 13.5 10*3/uL — AB (ref 4.0–10.5)

## 2015-01-05 LAB — GLUCOSE, CAPILLARY
GLUCOSE-CAPILLARY: 88 mg/dL (ref 65–99)
GLUCOSE-CAPILLARY: 105 mg/dL — AB (ref 65–99)
Glucose-Capillary: 63 mg/dL — ABNORMAL LOW (ref 65–99)
Glucose-Capillary: 172 mg/dL — ABNORMAL HIGH (ref 65–99)
Glucose-Capillary: 86 mg/dL (ref 65–99)

## 2015-01-05 LAB — BASIC METABOLIC PANEL
ANION GAP: 6 (ref 5–15)
BUN: 24 mg/dL — ABNORMAL HIGH (ref 6–20)
CO2: 27 mmol/L (ref 22–32)
Calcium: 8.7 mg/dL — ABNORMAL LOW (ref 8.9–10.3)
Chloride: 102 mmol/L (ref 101–111)
Creatinine, Ser: 2.11 mg/dL — ABNORMAL HIGH (ref 0.44–1.00)
GFR calc non Af Amer: 22 mL/min — ABNORMAL LOW (ref >60)
GFR, EST AFRICAN AMERICAN: 25 mL/min — AB (ref >60)
Glucose, Bld: 67 mg/dL (ref 65–99)
POTASSIUM: 4.8 mmol/L (ref 3.5–5.1)
Sodium: 135 mmol/L (ref 135–145)

## 2015-01-05 NOTE — Progress Notes (Addendum)
[facility] Progress Note   Becky Forbes  LEX:[id number]  DOB: Nov 22, 1940  DOA: 01/04/2015 PCP: Unice Cobble, MD  Brief narrative: Becky Forbes is a 74 y.o. female with atrial fibrillation on chronic anticoagulation with Coumadin, GI bleed in the past with endoscopy revealing internal and external hemorrhoids and diverticulosis. She presents to the Hospital with a complaint of 2 days of bloody stools and abdominal pain. CT scan performed in the ER reveals colitis of the transverse and descending colon.   Subjective: Having pain in the abdomen mostly in the left lower part. Continues to have bloody stool. No fever or chills. Mild nausea no vomiting.  Assessment/Plan: Principal Problem:   BRBPR (bright red blood per rectum) acute colitis/ leukocytosis -Previous endoscopy revealing internal and external hemorrhoids and diverticulosis -Current bleeding is secondary to acute colitis-infectious versus ischemic -Have sent C. difficile PCR and had pathogen panel --Continue ciprofloxacin and Flagyl for now -Clear liquids -GI following as well -Holding Coumadin at this time  Active Problems: Anemia of chronic disease -Continue to monitor for need for transfusion-baseline Hb appears to be 10-11  Acute on chronic renal failure stage III -Hold Lasix and Altace -Holding off on IV fluids as she is on clear liquids and should be able to maintain hydration -Follow I and O  Paroxysmal atrial fibrillation -Continue amiodarone and metoprolol -Coumadin on hold-cardiology recommends continuing to hold "indefinitely" or until she has another episode of atrial fibrillation  Hypotension with a history of hypertension -We'll need to continue metoprolol (at a lower dose) to prevent A. fib with RVR -Hold Altace    Chronic combined systolic and diastolic CHF (congestive heart failure) -Last echo 12/15 revealed an EF of 50-55% with elevated end-diastolic filling pressures and elevated PA  pressures -Monitor closely while holding Lasix    Diabetes mellitus with diabetic nephropathy -Hold Prandin for now as by mouth intake may not be as adequate as home intake - she is on only clear liquid -Continue to follow on sliding scale insulin    S/P cardiac pacemaker procedure, St. Jude device  Code Status:     Code Status Orders        Start     Ordered   01/04/15 1119  Full code   Continuous     01/04/15 1118     Family Communication: Husband and daughter at bedside Disposition Plan: Home when stable DVT prophylaxis: SCDs Consultants: GI, cardiology  Antibiotics: Anti-infectives    Start     Dose/Rate Route Frequency Ordered Stop   01/04/15 2100  ciprofloxacin (CIPRO) IVPB 400 mg     400 mg 200 mL/hr over 60 Minutes Intravenous Every 12 hours 01/04/15 1205     01/04/15 1800  metroNIDAZOLE (FLAGYL) IVPB 500 mg     500 mg 100 mL/hr over 60 Minutes Intravenous Every 8 hours 01/04/15 1205     01/04/15 0930  ciprofloxacin (CIPRO) IVPB 400 mg     400 mg 200 mL/hr over 60 Minutes Intravenous  Once 01/04/15 0922 01/04/15 1039   01/04/15 0930  metroNIDAZOLE (FLAGYL) IVPB 500 mg     500 mg 100 mL/hr over 60 Minutes Intravenous  Once 01/04/15 0922 01/04/15 1058      Objective: Filed Weights   01/04/15 0653 01/04/15 1141 01/05/15 1005  Weight: 87.998 kg (194 lb) 89.812 kg (198 lb) 90 kg (198 lb 6.6 oz)    Intake/Output Summary (Last 24 hours) at 01/05/15 1258 Last data filed at 01/04/15 1608  Gross per 24  hour  Intake      0 ml  Output    250 ml  Net   -250 ml     Vitals Filed Vitals:   01/04/15 1452 01/04/15 2224 01/05/15 0509 01/05/15 1005  BP: 112/41 108/47 102/52 110/55  Pulse: 59 60 61 68  Temp: 98.5 F (36.9 C) 98.5 F (36.9 C) 98.4 F (36.9 C) 98.8 F (37.1 C)  TempSrc: Oral Oral Oral Oral  Resp: 18 19 18 14   Height:      Weight:    90 kg (198 lb 6.6 oz)  SpO2: 95% 96% 93%     Exam:  General:  Pt is alert, not in acute distress  HEENT:  No icterus, No thrush, oral mucosa moist  Cardiovascular: regular rate and rhythm, S1/S2 No murmur  Respiratory: clear to auscultation bilaterally   Abdomen: Soft, +Bowel sounds, tender in left lower quadrant and suprapubic area, non distended, no guarding  MSK: No LE edema, cyanosis or clubbing  Data Reviewed: Basic Metabolic Panel:  Recent Labs Lab 01/04/15 0658 01/04/15 0735 01/04/15 0843 01/05/15 0512  NA 135 136  --  135  K 5.7* 5.5* 5.1 4.8  CL 101 99*  --  102  CO2 24  --   --  27  GLUCOSE 245* 238*  --  67  BUN 33* 35*  --  24*  CREATININE 2.53* 2.40*  --  2.11*  CALCIUM 9.5  --   --  8.7*   Liver Function Tests:  Recent Labs Lab 01/04/15 0658  AST 48*  ALT 29  ALKPHOS 119  BILITOT 1.7*  PROT 7.6  ALBUMIN 3.5   No results for input(s): LIPASE, AMYLASE in the last 168 hours. No results for input(s): AMMONIA in the last 168 hours. CBC:  Recent Labs Lab 01/04/15 0658 01/04/15 0735 01/05/15 0512  WBC 16.4*  --  14.8*  HGB 12.8 13.9 10.7*  HCT 37.8 41.0 32.5*  MCV 99.0  --  100.6*  PLT 271  --  208   Cardiac Enzymes: No results for input(s): CKTOTAL, CKMB, CKMBINDEX, TROPONINI in the last 168 hours. BNP (last 3 results) No results for input(s): BNP in the last 8760 hours.  ProBNP (last 3 results)  Recent Labs  03/28/14 0750  PROBNP 4959.0*    CBG:  Recent Labs Lab 01/04/15 1650 01/04/15 2215 01/05/15 0730 01/05/15 0759 01/05/15 1150  GLUCAP 91 98 63* 88 172*    No results found for this or any previous visit (from the past 240 hour(s)).   Studies: Ct Abdomen Pelvis Wo Contrast  01/04/2015   CLINICAL DATA:  Rectal bleeding since midnight. Bilateral lower abdominal pain.  EXAM: CT ABDOMEN AND PELVIS WITHOUT CONTRAST  TECHNIQUE: Multidetector CT imaging of the abdomen and pelvis was performed following the standard protocol without IV contrast.  COMPARISON:  None.  FINDINGS: Lower chest: Mild chronic interstitial lung disease. Multi  vessel coronary artery atherosclerosis partially visualized.  Hepatobiliary: Normal liver. No focal of attic mass. Cholelithiasis. No intrahepatic or extrahepatic biliary ductal dilatation.  Pancreas: Normal.  Spleen: Normal.  Adrenals/Urinary Tract: Normal adrenal glands. Normal kidneys. No urolithiasis or obstructive uropathy. Normal bladder.  Stomach/Bowel: No bowel dilatation to suggest obstruction. Bowel wall thickening involving the distal transverse colon and descending colon with mild pericolonic inflammatory changes. There is diverticulosis without evidence of diverticulitis. No pneumatosis, pneumoperitoneum or portal venous gas. No abdominal or pelvic free fluid. Moderate size hiatal hernia.  Vascular/Lymphatic: Normal abdominal aorta with atherosclerosis. No  abdominal or pelvic lymphadenopathy.  Reproductive: Prior hysterectomy.  No adnexal mass.  Other: No focal fluid collection or hematoma.  Musculoskeletal: No acute osseous abnormality. No lytic or sclerotic osseous lesion. Posterior spinal fusion from L3 through S1. Degenerative disc disease of the lower thoracic spine.  IMPRESSION: 1. Bowel wall thickening involving the distal transverse and descending colon most consistent with colitis secondary to an infectious, inflammatory or ischemic etiology. 2. Cholelithiasis.   Electronically Signed   By: Kathreen Devoid   On: 01/04/2015 09:10    Scheduled Meds:  Scheduled Meds: . amiodarone  200 mg Oral Daily  . atorvastatin  10 mg Oral QHS  . bromocriptine  2.5 mg Oral Daily  . calcium carbonate  1 tablet Oral Q breakfast  . ciprofloxacin  400 mg Intravenous Q12H  . insulin aspart  0-9 Units Subcutaneous TID WC  . metoprolol  12.5 mg Oral BID  . metronidazole  500 mg Intravenous Q8H  . sodium chloride  3 mL Intravenous Q12H   Continuous Infusions:   Time spent on care of this patient: 35 min   [place], MD 01/05/2015, 12:58 PM  LOS: 1 day   [facility] Office   [phone] Pager - Text Caydee Talkington per [email or web address] If 7PM-7AM, please contact night-coverage [email or web address]

## 2015-01-05 NOTE — Progress Notes (Signed)
Physical Therapy Treatment Patient Details Name: Becky Forbes MRN: [id number] DOB: 1941/02/06 Today's Date: 01/05/2015    History of Present Illness Patient is a 74 y/o female admitted with vomiting, diarrhea, and rectal bleeding. Abdominal CT scan was done which showed evidence of colitis in the distal transverse and in the descending colon. PMH includes HTN, HLD, PAF,DM, SSS, pacemaker.     PT Comments    Pt more fatigued today and seemingly having more difficulty with balance during gait training. Refuses to hold an assistive device despite recommendations. Practiced higher level dynamic gait challenges today. Patient will continue to benefit from skilled physical therapy services to further improve independence with functional mobility.   Follow Up Recommendations  Outpatient PT;Supervision for mobility/OOB     Equipment Recommendations  Other (comment) (Recommended cane, pt refuses)    Recommendations for Other Services       Precautions / Restrictions Precautions Precautions: Fall Restrictions Weight Bearing Restrictions: No    Mobility  Bed Mobility               General bed mobility comments: sitting on couch  Transfers Overall transfer level: Needs assistance Equipment used: None Transfers: Sit to/from Stand Sit to Stand: Min guard         General transfer comment: Min guard for safety. Reaching for furniture, refuses assistive device.  Ambulation/Gait Ambulation/Gait assistance: Min assist Ambulation Distance (Feet): 350 Feet Assistive device: None Gait Pattern/deviations: Staggering left;Drifts right/left;Narrow base of support;Scissoring;Step-through pattern   Gait velocity interpretation: at or above normal speed for age/gender General Gait Details: Staggering left at times requiring min assist to regain balance. Refuses assistive device such as cane or walker despite recommendations. Had pt practice higher level dynamic gait tasks such as high  marching, variable speeds, and vertical/horizontal head turns. Significant loss of balance with slower gait speed task. More fatigued today.   Stairs            Wheelchair Mobility    Modified Rankin (Stroke Patients Only)       Balance                                    Cognition Arousal/Alertness: Awake/alert Behavior During Therapy: WFL for tasks assessed/performed Overall Cognitive Status: Within Functional Limits for tasks assessed                      Exercises      General Comments General comments (skin integrity, edema, etc.): Discussed need for assistive device to lower fall risk      Pertinent Vitals/Pain Pain Assessment: Faces Faces Pain Scale: Hurts little more Pain Location: abdomen Pain Descriptors / Indicators: Aching;Constant Pain Intervention(s): Monitored during session;Repositioned    Home Living                      Prior Function            PT Goals (current goals can now be found in the care plan section) Acute Rehab PT Goals Patient Stated Goal: to feel better and return home PT Goal Formulation: With patient Time For Goal Achievement: 01/18/15 Potential to Achieve Goals: Good Progress towards PT goals: Progressing toward goals    Frequency  Min 3X/week    PT Plan Discharge plan needs to be updated    Co-evaluation  End of Session Equipment Utilized During Treatment: Gait belt Activity Tolerance: Patient tolerated treatment well Patient left: in chair;with call bell/phone within reach;with family/visitor present     Time: 1207-1222 PT Time Calculation (min) (ACUTE ONLY): 15 min  Charges:  $Gait Training: 8-22 mins                    G Codes:      Becky Forbes January 21, 2015, 1:16 PM Becky Forbes, [place]

## 2015-01-05 NOTE — Progress Notes (Signed)
Hypoglycemic Event  CBG: 63  Treatment: 15 GM carbohydrate snack  Symptoms: None  Follow-up CBG: Time:0755 CBG Result:88  Possible Reasons for Event: Inadequate meal intake    Becky Forbes

## 2015-01-05 NOTE — Progress Notes (Signed)
[facility] Progress Note  Subjective: The patient was seen today she is walking in the hall with physical therapy. She states that she had some further rectal bleeding overnight and earlier this morning. She is still having some abdominal discomfort.  Objective: Vital signs in last 24 hours: Temp:  [98.4 F (36.9 C)-98.8 F (37.1 C)] 98.8 F (37.1 C) (09/22 1005) Pulse Rate:  [59-68] 68 (09/22 1005) Resp:  [14-19] 14 (09/22 1005) BP: (102-112)/(41-55) 110/55 mmHg (09/22 1005) SpO2:  [93 %-96 %] 93 % (09/22 0509) Weight:  [90 kg (198 lb 6.6 oz)] 90 kg (198 lb 6.6 oz) (09/22 1005) Weight change: 1.814 kg (4 lb)   PE:  She does not appear in any acute distress. She is walking in the hall.  White blood cell count down to 14,800. Hemoglobin down to 10.7.  Lab Results: Results for orders placed or performed during the hospital encounter of 01/04/15 (from the past 24 hour(s))  Urinalysis, Routine w reflex microscopic (not at [facility])     Status: Abnormal   Collection Time: 01/04/15  4:20 PM  Result Value Ref Range   Color, Urine YELLOW YELLOW   APPearance CLEAR CLEAR   Specific Gravity, Urine 1.017 1.005 - 1.030   pH 5.0 5.0 - 8.0   Glucose, UA NEGATIVE NEGATIVE mg/dL   Hgb urine dipstick TRACE (A) NEGATIVE   Bilirubin Urine NEGATIVE NEGATIVE   Ketones, ur NEGATIVE NEGATIVE mg/dL   Protein, ur NEGATIVE NEGATIVE mg/dL   Urobilinogen, UA 0.2 0.0 - 1.0 mg/dL   Nitrite NEGATIVE NEGATIVE   Leukocytes, UA TRACE (A) NEGATIVE  Urine microscopic-add on     Status: None   Collection Time: 01/04/15  4:20 PM  Result Value Ref Range   Squamous Epithelial / LPF RARE RARE   WBC, UA 11-20 <3 WBC/hpf   RBC / HPF 3-6 <3 RBC/hpf  Glucose, capillary     Status: None   Collection Time: 01/04/15  4:50 PM  Result Value Ref Range   Glucose-Capillary 91 65 - 99 mg/dL   Comment 1 Notify RN   Glucose, capillary     Status: None   Collection Time: 01/04/15 10:15 PM  Result Value Ref  Range   Glucose-Capillary 98 65 - 99 mg/dL   Comment 1 Notify RN    Comment 2 Document in Chart   Basic metabolic panel     Status: Abnormal   Collection Time: 01/05/15  5:12 AM  Result Value Ref Range   Sodium 135 135 - 145 mmol/L   Potassium 4.8 3.5 - 5.1 mmol/L   Chloride 102 101 - 111 mmol/L   CO2 27 22 - 32 mmol/L   Glucose, Bld 67 65 - 99 mg/dL   BUN 24 (H) 6 - 20 mg/dL   Creatinine, Ser 2.11 (H) 0.44 - 1.00 mg/dL   Calcium 8.7 (L) 8.9 - 10.3 mg/dL   GFR calc non Af Amer 22 (L) >60 mL/min   GFR calc Af Amer 25 (L) >60 mL/min   Anion gap 6 5 - 15  CBC     Status: Abnormal   Collection Time: 01/05/15  5:12 AM  Result Value Ref Range   WBC 14.8 (H) 4.0 - 10.5 K/uL   RBC 3.23 (L) 3.87 - 5.11 MIL/uL   Hemoglobin 10.7 (L) 12.0 - 15.0 g/dL   HCT 32.5 (L) 36.0 - 46.0 %   MCV 100.6 (H) 78.0 - 100.0 fL   MCH 33.1 26.0 - 34.0 pg  MCHC 32.9 30.0 - 36.0 g/dL   RDW 14.0 11.5 - 15.5 %   Platelets 208 150 - 400 K/uL  Glucose, capillary     Status: Abnormal   Collection Time: 01/05/15  7:30 AM  Result Value Ref Range   Glucose-Capillary 63 (L) 65 - 99 mg/dL   Comment 1 Notify RN   Glucose, capillary     Status: None   Collection Time: 01/05/15  7:59 AM  Result Value Ref Range   Glucose-Capillary 88 65 - 99 mg/dL   Comment 1 Notify RN   Glucose, capillary     Status: Abnormal   Collection Time: 01/05/15 11:50 AM  Result Value Ref Range   Glucose-Capillary 172 (H) 65 - 99 mg/dL   Comment 1 Notify RN     Studies/Results: No results found.    Assessment: Colitis. Ischemic versus infectious.  Plan:   Continue to hold Coumadin. Continue supportive care. Continue Cipro and Flagyl.    SAM F Esmee Fallaw 01/05/2015, 1:30 PM  Pager: [phone] If no answer or after 5 PM call [phone]

## 2015-01-06 DIAGNOSIS — I10 Essential (primary) hypertension: Secondary | ICD-10-CM | POA: Diagnosis not present

## 2015-01-06 DIAGNOSIS — I6523 Occlusion and stenosis of bilateral carotid arteries: Secondary | ICD-10-CM | POA: Diagnosis present

## 2015-01-06 DIAGNOSIS — Z79899 Other long term (current) drug therapy: Secondary | ICD-10-CM | POA: Diagnosis not present

## 2015-01-06 DIAGNOSIS — Z23 Encounter for immunization: Secondary | ICD-10-CM | POA: Diagnosis not present

## 2015-01-06 DIAGNOSIS — Z950 Presence of cardiac pacemaker: Secondary | ICD-10-CM | POA: Diagnosis not present

## 2015-01-06 DIAGNOSIS — I480 Paroxysmal atrial fibrillation: Secondary | ICD-10-CM | POA: Diagnosis present

## 2015-01-06 DIAGNOSIS — R1084 Generalized abdominal pain: Secondary | ICD-10-CM | POA: Diagnosis not present

## 2015-01-06 DIAGNOSIS — Z90710 Acquired absence of both cervix and uterus: Secondary | ICD-10-CM | POA: Diagnosis not present

## 2015-01-06 DIAGNOSIS — K921 Melena: Secondary | ICD-10-CM | POA: Diagnosis not present

## 2015-01-06 DIAGNOSIS — Z7901 Long term (current) use of anticoagulants: Secondary | ICD-10-CM | POA: Diagnosis not present

## 2015-01-06 DIAGNOSIS — Z885 Allergy status to narcotic agent status: Secondary | ICD-10-CM | POA: Diagnosis not present

## 2015-01-06 DIAGNOSIS — R946 Abnormal results of thyroid function studies: Secondary | ICD-10-CM | POA: Diagnosis present

## 2015-01-06 DIAGNOSIS — N179 Acute kidney failure, unspecified: Secondary | ICD-10-CM | POA: Diagnosis not present

## 2015-01-06 DIAGNOSIS — K625 Hemorrhage of anus and rectum: Secondary | ICD-10-CM | POA: Diagnosis not present

## 2015-01-06 DIAGNOSIS — Z888 Allergy status to other drugs, medicaments and biological substances status: Secondary | ICD-10-CM | POA: Diagnosis not present

## 2015-01-06 DIAGNOSIS — K529 Noninfective gastroenteritis and colitis, unspecified: Secondary | ICD-10-CM

## 2015-01-06 DIAGNOSIS — Z96653 Presence of artificial knee joint, bilateral: Secondary | ICD-10-CM | POA: Diagnosis present

## 2015-01-06 DIAGNOSIS — E1122 Type 2 diabetes mellitus with diabetic chronic kidney disease: Secondary | ICD-10-CM | POA: Diagnosis present

## 2015-01-06 DIAGNOSIS — E1121 Type 2 diabetes mellitus with diabetic nephropathy: Secondary | ICD-10-CM | POA: Diagnosis present

## 2015-01-06 DIAGNOSIS — I129 Hypertensive chronic kidney disease with stage 1 through stage 4 chronic kidney disease, or unspecified chronic kidney disease: Secondary | ICD-10-CM | POA: Diagnosis present

## 2015-01-06 DIAGNOSIS — E875 Hyperkalemia: Secondary | ICD-10-CM | POA: Diagnosis present

## 2015-01-06 DIAGNOSIS — D638 Anemia in other chronic diseases classified elsewhere: Secondary | ICD-10-CM | POA: Diagnosis present

## 2015-01-06 DIAGNOSIS — K550 Acute vascular disorders of intestine: Secondary | ICD-10-CM | POA: Diagnosis present

## 2015-01-06 DIAGNOSIS — I5042 Chronic combined systolic (congestive) and diastolic (congestive) heart failure: Secondary | ICD-10-CM | POA: Diagnosis not present

## 2015-01-06 DIAGNOSIS — E785 Hyperlipidemia, unspecified: Secondary | ICD-10-CM | POA: Diagnosis present

## 2015-01-06 DIAGNOSIS — K559 Vascular disorder of intestine, unspecified: Secondary | ICD-10-CM | POA: Diagnosis not present

## 2015-01-06 DIAGNOSIS — R933 Abnormal findings on diagnostic imaging of other parts of digestive tract: Secondary | ICD-10-CM | POA: Diagnosis not present

## 2015-01-06 DIAGNOSIS — N183 Chronic kidney disease, stage 3 (moderate): Secondary | ICD-10-CM | POA: Diagnosis present

## 2015-01-06 DIAGNOSIS — I959 Hypotension, unspecified: Secondary | ICD-10-CM | POA: Diagnosis present

## 2015-01-06 LAB — CBC
HCT: 30.8 % — ABNORMAL LOW (ref 36.0–46.0)
Hemoglobin: 10.1 g/dL — ABNORMAL LOW (ref 12.0–15.0)
MCH: 32.6 pg (ref 26.0–34.0)
MCHC: 32.8 g/dL (ref 30.0–36.0)
MCV: 99.4 fL (ref 78.0–100.0)
Platelets: 186 K/uL (ref 150–400)
RBC: 3.1 MIL/uL — ABNORMAL LOW (ref 3.87–5.11)
RDW: 13.9 % (ref 11.5–15.5)
WBC: 11.6 K/uL — ABNORMAL HIGH (ref 4.0–10.5)

## 2015-01-06 LAB — GLUCOSE, CAPILLARY
GLUCOSE-CAPILLARY: 63 mg/dL — AB (ref 65–99)
GLUCOSE-CAPILLARY: 138 mg/dL — AB (ref 65–99)
Glucose-Capillary: 98 mg/dL (ref 65–99)
Glucose-Capillary: 168 mg/dL — ABNORMAL HIGH (ref 65–99)
Glucose-Capillary: 85 mg/dL (ref 65–99)

## 2015-01-06 LAB — BASIC METABOLIC PANEL WITH GFR
Anion gap: 7 (ref 5–15)
BUN: 21 mg/dL — ABNORMAL HIGH (ref 6–20)
CO2: 26 mmol/L (ref 22–32)
Calcium: 8.4 mg/dL — ABNORMAL LOW (ref 8.9–10.3)
Chloride: 101 mmol/L (ref 101–111)
Creatinine, Ser: 2.02 mg/dL — ABNORMAL HIGH (ref 0.44–1.00)
GFR calc Af Amer: 27 mL/min — ABNORMAL LOW
GFR calc non Af Amer: 23 mL/min — ABNORMAL LOW
Glucose, Bld: 73 mg/dL (ref 65–99)
Potassium: 4.3 mmol/L (ref 3.5–5.1)
Sodium: 134 mmol/L — ABNORMAL LOW (ref 135–145)

## 2015-01-06 NOTE — Care Management Note (Addendum)
Case Management Note  Patient Details  Name: Becky Forbes MRN: [id number] Date of Birth: 09-11-1940  Subjective/Objective:                    Action/Plan:  Prescription, front and back of insurance cards , demographics sheet faxed to [place] at [facility] [employer] , they will call patient with appointment.  PT recommending outpatient PT , patient would like to go to [facility] [employer] , [address], [place] , [place] [address] , phone [phone] .  Called [facility] [place] spoke to Santiago Glad , who instructed NCM to fax to [place] 336 [phone]   A hard copy of a prescription for outpatient PT , front and back of insurance card , DOB , address and phone number .    PT also recommending a cane , patient refusing NCM to order cane ,  visitor at bedside encouraged patient to "have it just in case " , patient states she has a cane at home already .  Paged MD for prescription . Expected Discharge Date:                  Expected Discharge Plan:  Home/Self Care  In-House Referral:     Discharge planning Services  CM Consult  Post Acute Care Choice:    Choice offered to:  Patient  DME Arranged:    DME Agency:     HH Arranged:    [place] Agency:     Status of Service:  In process, will continue to follow  Medicare Important Message Given:    Date Medicare IM Given:    Medicare IM give by:    Date Additional Medicare IM Given:    Additional Medicare Important Message give by:     If discussed at [place] of Stay Meetings, dates discussed:    Additional Comments:  Marilu Favre, RN 01/06/2015, 9:59 AM

## 2015-01-06 NOTE — Discharge Instructions (Signed)
Outpatient Therapy at [facility]. [place], [place] , [place] [address] Phone [phone]   They will call  you for appointment . If you don't hear from them in 2 business days call them

## 2015-01-06 NOTE — Progress Notes (Addendum)
[facility] Progress Note   Becky Forbes  OAC:[id number]  DOB: 02-16-1941  DOA: 01/04/2015 PCP: Unice Cobble, MD  Brief narrative: Becky Forbes is a 74 y.o. female with atrial fibrillation on chronic anticoagulation with Coumadin, GI bleed in the past with endoscopy revealing internal and external hemorrhoids and diverticulosis. She presents to the Hospital with a complaint of 2 days of bloody stools and abdominal pain. CT scan performed in the ER reveals colitis of the transverse and descending colon.   Subjective: Continues to have abdominal pain which has not changed in severity since yesterday. No bloody bowel movement since yesterday morning. No nausea or vomiting. Tolerating clears  Assessment/Plan: Principal Problem:   BRBPR (bright red blood per rectum) acute colitis/ leukocytosis -Previous endoscopy revealing internal and external hemorrhoids and diverticulosis -Current bleeding is secondary to acute colitis-infectious versus ischemic -Have sent GI pathogen panel - C. difficile PCR ordered separately however, patient has not had any further stools --Continue ciprofloxacin and Flagyl for now -GI following as well -Holding Coumadin at this time  Active Problems: Anemia of chronic disease -No drop in hemoglobin noted with above-mentioned blood loss -Continue to monitor for need for transfusion-baseline Hb appears to be 10-11  Acute on chronic renal failure stage III -Steadily improving with holding Lasix and Altace -Holding off on IV fluids as she is on a liquid diet and should be able to maintain hydration -Follow I and O  Paroxysmal atrial fibrillation -Continue amiodarone and metoprolol -Coumadin on hold-cardiology recommends continuing to hold "indefinitely" or until she has another episode of atrial fibrillation  Hypotension with a history of hypertension - continue metoprolol (at a lower dose) to prevent A. fib with RVR -Hold Altace    Chronic combined  systolic and diastolic CHF (congestive heart failure) -Last echo 12/15 revealed an EF of 50-55% with elevated end-diastolic filling pressures and elevated PA pressures -Monitor closely while holding Lasix    Diabetes mellitus with diabetic nephropathy -Hold Prandin for now as PO intake may not be as adequate as home intake - -Continue to follow on sliding scale insulin    S/P cardiac pacemaker procedure, [employer] Jude device  Code Status:     Code Status Orders        Start     Ordered   01/04/15 1119  Full code   Continuous     01/04/15 1118     Family Communication: Husband and daughter at bedside Disposition Plan: Home when stable DVT prophylaxis: SCDs Consultants: GI, cardiology  Antibiotics: Anti-infectives    Start     Dose/Rate Route Frequency Ordered Stop   01/04/15 2100  ciprofloxacin (CIPRO) IVPB 400 mg     400 mg 200 mL/hr over 60 Minutes Intravenous Every 12 hours 01/04/15 1205     01/04/15 1800  metroNIDAZOLE (FLAGYL) IVPB 500 mg     500 mg 100 mL/hr over 60 Minutes Intravenous Every 8 hours 01/04/15 1205     01/04/15 0930  ciprofloxacin (CIPRO) IVPB 400 mg     400 mg 200 mL/hr over 60 Minutes Intravenous  Once 01/04/15 0922 01/04/15 1039   01/04/15 0930  metroNIDAZOLE (FLAGYL) IVPB 500 mg     500 mg 100 mL/hr over 60 Minutes Intravenous  Once 01/04/15 0922 01/04/15 1058      Objective: Filed Weights   01/04/15 1141 01/05/15 1005 01/06/15 0738  Weight: 89.812 kg (198 lb) 90 kg (198 lb 6.6 oz) 90.4 kg (199 lb 4.7 oz)    Intake/Output Summary (  Last 24 hours) at 01/06/15 1319 Last data filed at 01/06/15 0603  Gross per 24 hour  Intake    940 ml  Output   1650 ml  Net   -710 ml     Vitals Filed Vitals:   01/06/15 0601 01/06/15 0738 01/06/15 0800 01/06/15 1042  BP: 127/50  106/54 130/83  Pulse: 60  67 69  Temp: 98.5 F (36.9 C)  98.1 F (36.7 C)   TempSrc: Oral  Oral   Resp: 18     Height:      Weight:  90.4 kg (199 lb 4.7 oz)    SpO2: 98%   96%     Exam:  General:  Pt is alert, not in acute distress  HEENT: No icterus, No thrush, oral mucosa moist  Cardiovascular: regular rate and rhythm, S1/S2 No murmur  Respiratory: clear to auscultation bilaterally   Abdomen: Soft, +Bowel sounds, tender across epigastrium, left upper quadrant, left lower quadrant and suprapubic area, non distended, no guarding  MSK: No LE edema, cyanosis or clubbing  Data Reviewed: Basic Metabolic Panel:  Recent Labs Lab 01/04/15 0658 01/04/15 0735 01/04/15 0843 01/05/15 0512 01/06/15 0115  NA 135 136  --  135 134*  K 5.7* 5.5* 5.1 4.8 4.3  CL 101 99*  --  102 101  CO2 24  --   --  27 26  GLUCOSE 245* 238*  --  67 73  BUN 33* 35*  --  24* 21*  CREATININE 2.53* 2.40*  --  2.11* 2.02*  CALCIUM 9.5  --   --  8.7* 8.4*   Liver Function Tests:  Recent Labs Lab 01/04/15 0658  AST 48*  ALT 29  ALKPHOS 119  BILITOT 1.7*  PROT 7.6  ALBUMIN 3.5   No results for input(s): LIPASE, AMYLASE in the last 168 hours. No results for input(s): AMMONIA in the last 168 hours. CBC:  Recent Labs Lab 01/04/15 0658 01/04/15 0735 01/05/15 0512 01/05/15 1557 01/06/15 0115  WBC 16.4*  --  14.8* 13.5* 11.6*  HGB 12.8 13.9 10.7* 9.9* 10.1*  HCT 37.8 41.0 32.5* 30.1* 30.8*  MCV 99.0  --  100.6* 100.0 99.4  PLT 271  --  208 190 186   Cardiac Enzymes: No results for input(s): CKTOTAL, CKMB, CKMBINDEX, TROPONINI in the last 168 hours. BNP (last 3 results) No results for input(s): BNP in the last 8760 hours.  ProBNP (last 3 results)  Recent Labs  03/28/14 0750  PROBNP 4959.0*    CBG:  Recent Labs Lab 01/05/15 1150 01/05/15 1658 01/05/15 2129 01/06/15 0826 01/06/15 1153  GLUCAP 172* 86 105* 98 168*    No results found for this or any previous visit (from the past 240 hour(s)).   Studies: No results found.  Scheduled Meds:  Scheduled Meds: . amiodarone  200 mg Oral Daily  . atorvastatin  10 mg Oral QHS  . bromocriptine   2.5 mg Oral Daily  . calcium carbonate  1 tablet Oral Q breakfast  . ciprofloxacin  400 mg Intravenous Q12H  . insulin aspart  0-9 Units Subcutaneous TID WC  . metoprolol  12.5 mg Oral BID  . metronidazole  500 mg Intravenous Q8H  . sodium chloride  3 mL Intravenous Q12H   Continuous Infusions:   Time spent on care of this patient: 35 min   [place], MD 01/06/2015, 1:19 PM  LOS: 2 days   [facility] Office  [phone] Pager - Text Page per [email or web address] If 7PM-7AM,  please contact night-coverage [email or web address]

## 2015-01-06 NOTE — Care Management (Signed)
Becky Forbes from [facility]  unable to accept referral , patient and daughter aware.  Offered choice again they would like Becky Forbes [facility] at [facility] , phone [phone] fax [phone]. Referral faxed . Magdalen Spatz RN BSN [phone]

## 2015-01-06 NOTE — Progress Notes (Signed)
Physical Therapy Treatment Patient Details Name: Becky Forbes MRN: [id number] DOB: [birth year] Today's Date: 01/06/2015    History of Present Illness Patient is a 74 y/o female admitted with vomiting, diarrhea, and rectal bleeding. Abdominal CT scan was done which showed evidence of colitis in the distal transverse and in the descending colon. PMH includes HTN, HLD, PAF,DM, SSS, pacemaker.     PT Comments    Becky Forbes continues to refuse to use assistive device (AD) this session and demonstrates LOB x1 while performing marching while ambulating.  Instability noted w/ head turns while ambulating as well.  Educated pt and pt's family on potential consequences of not using AD as she is at an increased risk for falling.  Pt will benefit from continued skilled PT services to increase functional independence and safety.   Follow Up Recommendations  Outpatient PT;Supervision for mobility/OOB     Equipment Recommendations  Other (comment) (Recommended cane, pt refuses)    Recommendations for Other Services       Precautions / Restrictions Precautions Precautions: Fall Restrictions Weight Bearing Restrictions: No    Mobility  Bed Mobility Overal bed mobility: Independent                Transfers Overall transfer level: Needs assistance Equipment used: None Transfers: Sit to/from Stand Sit to Stand: Min guard         General transfer comment: Min guard for safety. Reaching for IV pole, refuses assistive device  Ambulation/Gait Ambulation/Gait assistance: Min assist Ambulation Distance (Feet): 450 Feet Assistive device: None Gait Pattern/deviations: Step-through pattern;Staggering left;Staggering right   Gait velocity interpretation: at or above normal speed for age/gender General Gait Details: Staggering Lt and Rt w/ head turns Bil.  Pt loses her balance and requires min assist to stabilize when asked to perform marching while ambulating.     Stairs             Wheelchair Mobility    Modified Rankin (Stroke Patients Only)       Balance Overall balance assessment: Needs assistance Sitting-balance support: Feet supported Sitting balance-Leahy Scale: Good     Standing balance support: No upper extremity supported;During functional activity Standing balance-Leahy Scale: Fair Standing balance comment: Pt w/ LOB w/ challenges to her balance w/ ambulation Single Leg Stance - Right Leg: 30 (30 sec w/ support of two fingers on Bil UEs on counter) Single Leg Stance - Left Leg: 30 (30 sec w/ support of two fingers on Bil UEs on counter)                Cognition Arousal/Alertness: Awake/alert Behavior During Therapy: WFL for tasks assessed/performed Overall Cognitive Status: Within Functional Limits for tasks assessed                      Exercises General Exercises - Lower Extremity Mini-Sqauts: Strengthening;Both;10 reps;Standing    General Comments General comments (skin integrity, edema, etc.): Educated pt and pt's family on benefits of using AD (cane) at home to dec likelihood of falling as pt w/ LOB during ambulation this session.  Pt says, "well I don't normally march while I walk".  Encouraged pt to follow up w/ OPPT to address balance impairments.      Pertinent Vitals/Pain Pain Assessment: 0-10 Pain Score: 2  Pain Location: abdomen Pain Descriptors / Indicators: Aching;Constant Pain Intervention(s): Limited activity within patient's tolerance;Monitored during session;Repositioned    Home Living  Prior Function            PT Goals (current goals can now be found in the care plan section) Acute Rehab PT Goals Patient Stated Goal: to feel better and return home PT Goal Formulation: With patient Time For Goal Achievement: 01/18/15 Potential to Achieve Goals: Good Progress towards PT goals: Progressing toward goals    Frequency  Min 3X/week    PT Plan Current plan remains  appropriate    Co-evaluation             End of Session Equipment Utilized During Treatment: Gait belt Activity Tolerance: Patient tolerated treatment well Patient left: in chair;with call bell/phone within reach;with family/visitor present     Time: 5427-0623 PT Time Calculation (min) (ACUTE ONLY): 17 min  Charges:  $Gait Training: 8-22 mins                    G Codes:      Becky Forbes PT, [place] [phone] Pager: [phone] 01/06/2015, 3:22 PM

## 2015-01-06 NOTE — Progress Notes (Signed)
[facility] Progress Note  Subjective: Patient is feeling better today. Less abdominal discomfort. No further bleeding. Less stooling.  Objective: Vital signs in last 24 hours: Temp:  [97.4 F (36.3 C)-98.5 F (36.9 C)] 98.1 F (36.7 C) (09/23 0800) Pulse Rate:  [56-69] 69 (09/23 1042) Resp:  [12-18] 18 (09/23 0601) BP: (105-130)/(44-83) 130/83 mmHg (09/23 1042) SpO2:  [96 %-100 %] 96 % (09/23 0800) Weight:  [90.4 kg (199 lb 4.7 oz)] 90.4 kg (199 lb 4.7 oz) (09/23 0738) Weight change: 0.188 kg (6.6 oz)   PE:  No distress  Heart regular rhythm  Lungs clear  Abdomen soft and nontender  Lab Results: Results for orders placed or performed during the hospital encounter of 01/04/15 (from the past 24 hour(s))  Glucose, capillary     Status: Abnormal   Collection Time: 01/05/15 11:50 AM  Result Value Ref Range   Glucose-Capillary 172 (H) 65 - 99 mg/dL   Comment 1 Notify RN   CBC     Status: Abnormal   Collection Time: 01/05/15  3:57 PM  Result Value Ref Range   WBC 13.5 (H) 4.0 - 10.5 K/uL   RBC 3.01 (L) 3.87 - 5.11 MIL/uL   Hemoglobin 9.9 (L) 12.0 - 15.0 g/dL   HCT 30.1 (L) 36.0 - 46.0 %   MCV 100.0 78.0 - 100.0 fL   MCH 32.9 26.0 - 34.0 pg   MCHC 32.9 30.0 - 36.0 g/dL   RDW 14.2 11.5 - 15.5 %   Platelets 190 150 - 400 K/uL  Glucose, capillary     Status: None   Collection Time: 01/05/15  4:58 PM  Result Value Ref Range   Glucose-Capillary 86 65 - 99 mg/dL  Glucose, capillary     Status: Abnormal   Collection Time: 01/05/15  9:29 PM  Result Value Ref Range   Glucose-Capillary 105 (H) 65 - 99 mg/dL   Comment 1 Notify RN    Comment 2 Document in Chart   CBC     Status: Abnormal   Collection Time: 01/06/15  1:15 AM  Result Value Ref Range   WBC 11.6 (H) 4.0 - 10.5 K/uL   RBC 3.10 (L) 3.87 - 5.11 MIL/uL   Hemoglobin 10.1 (L) 12.0 - 15.0 g/dL   HCT 30.8 (L) 36.0 - 46.0 %   MCV 99.4 78.0 - 100.0 fL   MCH 32.6 26.0 - 34.0 pg   MCHC 32.8 30.0 - 36.0 g/dL    RDW 13.9 11.5 - 15.5 %   Platelets 186 150 - 400 K/uL  Basic metabolic panel     Status: Abnormal   Collection Time: 01/06/15  1:15 AM  Result Value Ref Range   Sodium 134 (L) 135 - 145 mmol/L   Potassium 4.3 3.5 - 5.1 mmol/L   Chloride 101 101 - 111 mmol/L   CO2 26 22 - 32 mmol/L   Glucose, Bld 73 65 - 99 mg/dL   BUN 21 (H) 6 - 20 mg/dL   Creatinine, Ser 2.02 (H) 0.44 - 1.00 mg/dL   Calcium 8.4 (L) 8.9 - 10.3 mg/dL   GFR calc non Af Amer 23 (L) >60 mL/min   GFR calc Af Amer 27 (L) >60 mL/min   Anion gap 7 5 - 15  Glucose, capillary     Status: None   Collection Time: 01/06/15  8:26 AM  Result Value Ref Range   Glucose-Capillary 98 65 - 99 mg/dL   Comment 1 Notify RN  Studies/Results: No results found.    Assessment: Ischemic colitis, rule out infectious colitis GI pathogens panel is pending  Plan:   Continue supportive care with IV antibiotics which are Cipro and Flagyl. Her white count is improving.    SAM F GANEM 01/06/2015, 11:20 AM  Pager: [phone] If no answer or after 5 PM call [phone]

## 2015-01-06 NOTE — Progress Notes (Addendum)
Hypoglycemic Event  CBG: 63  Treatment: 15 GM carbohydrate snack  Symptoms: None  Follow-up CBG: Time:1712 CBG Result:85  Possible Reasons for Event: Inadequate meal intake (liquid diet)   Becky Forbes

## 2015-01-07 LAB — C DIFFICILE QUICK SCREEN W PCR REFLEX
C Diff antigen: NEGATIVE
C Diff interpretation: NEGATIVE
C Diff toxin: NEGATIVE

## 2015-01-07 LAB — CBC
HEMATOCRIT: 30.2 % — AB (ref 36.0–46.0)
HEMOGLOBIN: 10.2 g/dL — AB (ref 12.0–15.0)
MCH: 33.7 pg (ref 26.0–34.0)
MCHC: 33.8 g/dL (ref 30.0–36.0)
MCV: 99.7 fL (ref 78.0–100.0)
Platelets: 204 10*3/uL (ref 150–400)
RBC: 3.03 MIL/uL — AB (ref 3.87–5.11)
RDW: 13.9 % (ref 11.5–15.5)
WBC: 7.6 10*3/uL (ref 4.0–10.5)

## 2015-01-07 LAB — BASIC METABOLIC PANEL
Anion gap: 8 (ref 5–15)
BUN: 15 mg/dL (ref 6–20)
CHLORIDE: 102 mmol/L (ref 101–111)
CO2: 25 mmol/L (ref 22–32)
CREATININE: 1.7 mg/dL — AB (ref 0.44–1.00)
Calcium: 8.4 mg/dL — ABNORMAL LOW (ref 8.9–10.3)
GFR calc non Af Amer: 28 mL/min — ABNORMAL LOW (ref >60)
GFR, EST AFRICAN AMERICAN: 33 mL/min — AB (ref >60)
Glucose, Bld: 77 mg/dL (ref 65–99)
POTASSIUM: 3.9 mmol/L (ref 3.5–5.1)
Sodium: 135 mmol/L (ref 135–145)

## 2015-01-07 LAB — GLUCOSE, CAPILLARY
GLUCOSE-CAPILLARY: 102 mg/dL — AB (ref 65–99)
Glucose-Capillary: 88 mg/dL (ref 65–99)
Glucose-Capillary: 190 mg/dL — ABNORMAL HIGH (ref 65–99)
Glucose-Capillary: 149 mg/dL — ABNORMAL HIGH (ref 65–99)

## 2015-01-07 NOTE — Progress Notes (Signed)
[facility] Progress Note   Becky Forbes  TDD:[id number]  DOB: [birth year]  DOA: 01/04/2015 PCP: Unice Cobble, MD  Brief narrative: Becky Forbes is a 74 y.o. female with atrial fibrillation on chronic anticoagulation with Coumadin, GI bleed in the past with endoscopy revealing internal and external hemorrhoids and diverticulosis. She presents to the Hospital with a complaint of 2 days of bloody stools and abdominal pain. CT scan performed in the ER reveals colitis of the transverse and descending colon.   Subjective: Abdominal pain improving. Full liquids causing bloating and nausea. She had a small amount of pure blood per rectum last night and again this AM.   Assessment/Plan: Principal Problem:   BRBPR (bright red blood per rectum) acute colitis/ leukocytosis -Previous endoscopy revealing internal and external hemorrhoids and diverticulosis -Current bleeding is secondary to acute colitis-infectious versus ischemic -Have sent GI pathogen panel which is pending - C. difficile PCR ordered separately however, patient has not had any further stool (just passing blood) --Continue ciprofloxacin and Flagyl for now- leukocytosis resolved  -GI following as well -Holding Coumadin at this time  Active Problems: Anemia of chronic disease -No drop in hemoglobin noted with above-mentioned blood loss -Continue to monitor for need for transfusion-baseline Hb appears to be 10-11  Acute on chronic renal failure stage III -Steadily improving with holding Lasix and Altace - Baseline Cr 1.6- 1.7 -Holding off on IV fluids as she is on a liquid diet and should be able to maintain hydration -Follow I and O  Paroxysmal atrial fibrillation -Continue amiodarone and metoprolol -Coumadin on hold-cardiology recommends continuing to hold "indefinitely" or until she has another episode of atrial fibrillation - currently in A-fib but will continue to hold Coumadin for now due ongoing  bleeding  Hypotension with a history of hypertension - continue metoprolol (at a lower dose) to prevent A. fib with RVR -Hold Altace    Chronic combined systolic and diastolic CHF (congestive heart failure) -Last echo 12/15 revealed an EF of 50-55% with elevated end-diastolic filling pressures and elevated PA pressures -Monitor closely while holding Lasix    Diabetes mellitus with diabetic nephropathy -Hold Prandin for now as PO intake may not be as adequate as home intake - -Continue to follow on sliding scale insulin    S/P cardiac pacemaker procedure, [employer] Jude device  Code Status:     Code Status Orders        Start     Ordered   01/04/15 1119  Full code   Continuous     01/04/15 1118     Family Communication: Husband and daughter at bedside Disposition Plan: Home when stable DVT prophylaxis: SCDs Consultants: GI, cardiology  Antibiotics: Anti-infectives    Start     Dose/Rate Route Frequency Ordered Stop   01/04/15 2100  ciprofloxacin (CIPRO) IVPB 400 mg     400 mg 200 mL/hr over 60 Minutes Intravenous Every 12 hours 01/04/15 1205     01/04/15 1800  metroNIDAZOLE (FLAGYL) IVPB 500 mg     500 mg 100 mL/hr over 60 Minutes Intravenous Every 8 hours 01/04/15 1205     01/04/15 0930  ciprofloxacin (CIPRO) IVPB 400 mg     400 mg 200 mL/hr over 60 Minutes Intravenous  Once 01/04/15 0922 01/04/15 1039   01/04/15 0930  metroNIDAZOLE (FLAGYL) IVPB 500 mg     500 mg 100 mL/hr over 60 Minutes Intravenous  Once 01/04/15 6237 01/04/15 1058      Objective: [employer]  01/05/15 1005 01/06/15 0738 01/07/15 0500  Weight: 90 kg (198 lb 6.6 oz) 90.4 kg (199 lb 4.7 oz) 90.8 kg (200 lb 2.8 oz)    Intake/Output Summary (Last 24 hours) at 01/07/15 1115 Last data filed at 01/07/15 0600  Gross per 24 hour  Intake   1793 ml  Output      0 ml  Net   1793 ml     Vitals Filed Vitals:   01/06/15 1341 01/06/15 2222 01/07/15 0500 01/07/15 0648  BP: 103/47 130/60  137/60   Pulse: 62 65  64  Temp: 98.7 F (37.1 C) 98.2 F (36.8 C)  98.4 F (36.9 C)  TempSrc: Axillary Oral  Oral  Resp: 18 18  18   Height:      Weight:   90.8 kg (200 lb 2.8 oz)   SpO2: 100% 99%  98%    Exam:  General:  Pt is alert, not in acute distress  HEENT: No icterus, No thrush, oral mucosa moist  Cardiovascular: regular rate and rhythm, S1/S2 No murmur  Respiratory: clear to auscultation bilaterally   Abdomen: Soft, +Bowel sounds, tender across epigastrium, left upper quadrant, left lower quadrant and suprapubic area, non distended, no guarding  MSK: No LE edema, cyanosis or clubbing  Data Reviewed: Basic Metabolic Panel:  Recent Labs Lab 01/04/15 0658 01/04/15 0735 01/04/15 0843 01/05/15 0512 01/06/15 0115 01/07/15 0433  NA 135 136  --  135 134* 135  K 5.7* 5.5* 5.1 4.8 4.3 3.9  CL 101 99*  --  102 101 102  CO2 24  --   --  27 26 25   GLUCOSE 245* 238*  --  67 73 77  BUN 33* 35*  --  24* 21* 15  CREATININE 2.53* 2.40*  --  2.11* 2.02* 1.70*  CALCIUM 9.5  --   --  8.7* 8.4* 8.4*   Liver Function Tests:  Recent Labs Lab 01/04/15 0658  AST 48*  ALT 29  ALKPHOS 119  BILITOT 1.7*  PROT 7.6  ALBUMIN 3.5   No results for input(s): LIPASE, AMYLASE in the last 168 hours. No results for input(s): AMMONIA in the last 168 hours. CBC:  Recent Labs Lab 01/04/15 0658 01/04/15 0735 01/05/15 0512 01/05/15 1557 01/06/15 0115 01/07/15 0433  WBC 16.4*  --  14.8* 13.5* 11.6* 7.6  HGB 12.8 13.9 10.7* 9.9* 10.1* 10.2*  HCT 37.8 41.0 32.5* 30.1* 30.8* 30.2*  MCV 99.0  --  100.6* 100.0 99.4 99.7  PLT 271  --  208 190 186 204   Cardiac Enzymes: No results for input(s): CKTOTAL, CKMB, CKMBINDEX, TROPONINI in the last 168 hours. BNP (last 3 results) No results for input(s): BNP in the last 8760 hours.  ProBNP (last 3 results)  Recent Labs  03/28/14 0750  PROBNP 4959.0*    CBG:  Recent Labs Lab 01/06/15 1153 01/06/15 1636 01/06/15 1715  01/06/15 2219 01/07/15 0739  GLUCAP 168* 63* 85 138* 88    No results found for this or any previous visit (from the past 240 hour(s)).   Studies: No results found.  Scheduled Meds:  Scheduled Meds: . amiodarone  200 mg Oral Daily  . atorvastatin  10 mg Oral QHS  . bromocriptine  2.5 mg Oral Daily  . calcium carbonate  1 tablet Oral Q breakfast  . ciprofloxacin  400 mg Intravenous Q12H  . insulin aspart  0-9 Units Subcutaneous TID WC  . metoprolol  12.5 mg Oral BID  . metronidazole  500  mg Intravenous Q8H  . sodium chloride  3 mL Intravenous Q12H   Continuous Infusions:   Time spent on care of this patient: 35 min   [place], MD 01/07/2015, 11:15 AM  LOS: 3 days   [facility] Office  [phone] Pager - Text Page per [email or web address] If 7PM-7AM, please contact night-coverage [email or web address]

## 2015-01-07 NOTE — Progress Notes (Signed)
Had a small very soft BM with small streak of blood .

## 2015-01-07 NOTE — Progress Notes (Signed)
Subjective: Blood in stool has recurred. Some upper abdominal discomfort.  Objective: Vital signs in last 24 hours: Temp:  [98.2 F (36.8 C)-98.7 F (37.1 C)] 98.4 F (36.9 C) (09/24 0648) Pulse Rate:  [62-65] 64 (09/24 0648) Resp:  [18] 18 (09/24 0648) BP: (103-137)/(47-60) 137/60 mmHg (09/24 0648) SpO2:  [98 %-100 %] 98 % (09/24 0648) Weight:  [90.8 kg (200 lb 2.8 oz)] 90.8 kg (200 lb 2.8 oz) (09/24 0500) Weight change: 0.4 kg (14.1 oz) Last BM Date: 01/05/15  PE: GEN:  NAD ABD:  Soft, mild upper abdominal tenderness without peritonitis  Lab Results: CBC    Component Value Date/Time   WBC 7.6 01/07/2015 0433   RBC 3.03* 01/07/2015 0433   HGB 10.2* 01/07/2015 0433   HCT 30.2* 01/07/2015 0433   PLT 204 01/07/2015 0433   MCV 99.7 01/07/2015 0433   MCH 33.7 01/07/2015 0433   MCHC 33.8 01/07/2015 0433   RDW 13.9 01/07/2015 0433   LYMPHSABS 1.0 12/20/2014 0935   MONOABS 0.7 12/20/2014 0935   EOSABS 0.3 12/20/2014 0935   BASOSABS 0.0 12/20/2014 0935   CMP     Component Value Date/Time   NA 135 01/07/2015 0433   K 3.9 01/07/2015 0433   CL 102 01/07/2015 0433   CO2 25 01/07/2015 0433   GLUCOSE 77 01/07/2015 0433   GLUCOSE 120* 04/21/2006 1234   BUN 15 01/07/2015 0433   CREATININE 1.70* 01/07/2015 0433   CALCIUM 8.4* 01/07/2015 0433   PROT 7.6 01/04/2015 0658   ALBUMIN 3.5 01/04/2015 0658   AST 48* 01/04/2015 0658   ALT 29 01/04/2015 0658   ALKPHOS 119 01/04/2015 0658   BILITOT 1.7* 01/04/2015 0658   GFRNONAA 28* 01/07/2015 0433   GFRAA 33* 01/07/2015 0433   Assessment:  1.  Blood in stool. 2.  Abdominal pain.  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  Plan:  1.  Diet has been de-escalated to clear liquid. 2.  Follow clinical symptoms and blood in stool. 3.  GI pathogen panel hasn't been done, because she hasn't had a bowel movement; once she has a bowel movement, I would question the validity of any "negative"  results, she's been on antibiotics for several days. 4.  Continuing antibiotics. 5.  If patient doesn't improve clinically over the next couple days, might consider sigmoidoscopy early next week for further evaluation. 6.  [facility] will follow.  Becky Forbes 01/07/2015, 1:11 PM   Pager [phone] If no answer or after 5 PM call [phone]

## 2015-01-08 DIAGNOSIS — E0821 Diabetes mellitus due to underlying condition with diabetic nephropathy: Secondary | ICD-10-CM

## 2015-01-08 LAB — CBC
HEMATOCRIT: 30.5 % — AB (ref 36.0–46.0)
HEMOGLOBIN: 10.3 g/dL — AB (ref 12.0–15.0)
MCH: 33.6 pg (ref 26.0–34.0)
MCHC: 33.8 g/dL (ref 30.0–36.0)
MCV: 99.3 fL (ref 78.0–100.0)
Platelets: 211 10*3/uL (ref 150–400)
RBC: 3.07 MIL/uL — ABNORMAL LOW (ref 3.87–5.11)
RDW: 13.8 % (ref 11.5–15.5)
WBC: 7 10*3/uL (ref 4.0–10.5)

## 2015-01-08 LAB — BASIC METABOLIC PANEL
ANION GAP: 8 (ref 5–15)
BUN: 13 mg/dL (ref 6–20)
CALCIUM: 8.6 mg/dL — AB (ref 8.9–10.3)
CO2: 25 mmol/L (ref 22–32)
Chloride: 104 mmol/L (ref 101–111)
Creatinine, Ser: 1.72 mg/dL — ABNORMAL HIGH (ref 0.44–1.00)
GFR calc Af Amer: 33 mL/min — ABNORMAL LOW (ref >60)
GFR, EST NON AFRICAN AMERICAN: 28 mL/min — AB (ref >60)
GLUCOSE: 79 mg/dL (ref 65–99)
Potassium: 3.7 mmol/L (ref 3.5–5.1)
Sodium: 137 mmol/L (ref 135–145)

## 2015-01-08 LAB — GLUCOSE, CAPILLARY
GLUCOSE-CAPILLARY: 111 mg/dL — AB (ref 65–99)
GLUCOSE-CAPILLARY: 191 mg/dL — AB (ref 65–99)
Glucose-Capillary: 118 mg/dL — ABNORMAL HIGH (ref 65–99)
Glucose-Capillary: 179 mg/dL — ABNORMAL HIGH (ref 65–99)

## 2015-01-08 MED ORDER — METRONIDAZOLE 500 MG PO TABS
500.0000 mg | ORAL_TABLET | Freq: Three times a day (TID) | ORAL | Status: DC
  Administered 2015-01-08 – 2015-01-09 (×2): 500 mg via ORAL
  Filled 2015-01-08 (×2): qty 1

## 2015-01-08 MED ORDER — CIPROFLOXACIN HCL 500 MG PO TABS
500.0000 mg | ORAL_TABLET | Freq: Two times a day (BID) | ORAL | Status: DC
  Administered 2015-01-08 – 2015-01-09 (×2): 500 mg via ORAL
  Filled 2015-01-08 (×2): qty 1

## 2015-01-08 NOTE — Progress Notes (Signed)
CDiff results negative.  Dr. Wynelle Cleveland notified.

## 2015-01-08 NOTE — Progress Notes (Signed)
[facility] Progress Note   Becky Forbes  NWG:[id number]  DOB: [birth year]  DOA: 01/04/2015 PCP: Unice Cobble, MD  Brief narrative: Becky Forbes is a 74 y.o. female with atrial fibrillation on chronic anticoagulation with Coumadin, GI bleed in the past with endoscopy revealing internal and external hemorrhoids and diverticulosis. She presents to the Hospital with a complaint of 2 days of bloody stools and abdominal pain. CT scan performed in the ER reveals colitis of the transverse and descending colon.   Subjective: Abdominal pain continues to improve. She continues to have small amount of blood mixed with stool. No nausea or fevers.   Assessment/Plan: Principal Problem:   BRBPR (bright red blood per rectum) acute colitis/ leukocytosis -Previous endoscopy revealing internal and external hemorrhoids and diverticulosis -Current bleeding is secondary to acute colitis-infectious versus ischemic -Have sent GI pathogen panel which is pending - C. difficile PCR negative --Continue ciprofloxacin and Flagyl for now- leukocytosis resolved  -GI following as well -Holding Coumadin at this time  Active Problems: Anemia of chronic disease -No drop in hemoglobin noted with above-mentioned blood loss -Continue to monitor for need for transfusion-baseline Hb appears to be 10-11  Acute on chronic renal failure stage III -Steadily improving with holding Lasix and Altace - Baseline Cr 1.6- 1.7 -Holding off on IV fluids as she is on a liquid diet and should be able to maintain hydration -Follow I and O  Paroxysmal atrial fibrillation -Continue amiodarone and metoprolol -Coumadin on hold-cardiology recommends continuing to hold "indefinitely" or until she has another episode of atrial fibrillation - currently in A-fib but will continue to hold Coumadin for now due ongoing bleeding  Hypotension with a history of hypertension - continue metoprolol (at a lower dose) to prevent A. fib with  RVR -Hold Altace    Chronic combined systolic and diastolic CHF (congestive heart failure) -Last echo 12/15 revealed an EF of 50-55% with elevated end-diastolic filling pressures and elevated PA pressures -Monitor closely while holding Lasix    Diabetes mellitus with diabetic nephropathy -Hold Prandin for now as PO intake may not be as adequate as home intake - -Continue to follow on sliding scale insulin    S/P cardiac pacemaker procedure, [employer] device  Code Status:     Code Status Orders        Start     Ordered   01/04/15 1119  Full code   Continuous     01/04/15 1118     Family Communication: Husband and daughter at bedside Disposition Plan: Home when stable DVT prophylaxis: SCDs Consultants: GI, cardiology  Antibiotics: Anti-infectives    Start     Dose/Rate Route Frequency Ordered Stop   01/08/15 1400  metroNIDAZOLE (FLAGYL) tablet 500 mg     500 mg Oral 3 times per day 01/08/15 1025 01/13/15 1359   01/08/15 1030  ciprofloxacin (CIPRO) tablet 500 mg     500 mg Oral 2 times daily 01/08/15 1025 01/13/15 0759   01/04/15 2100  ciprofloxacin (CIPRO) IVPB 400 mg  Status:  Discontinued     400 mg 200 mL/hr over 60 Minutes Intravenous Every 12 hours 01/04/15 1205 01/08/15 1025   01/04/15 1800  metroNIDAZOLE (FLAGYL) IVPB 500 mg  Status:  Discontinued     500 mg 100 mL/hr over 60 Minutes Intravenous Every 8 hours 01/04/15 1205 01/08/15 1025   01/04/15 0930  ciprofloxacin (CIPRO) IVPB 400 mg     400 mg 200 mL/hr over 60 Minutes Intravenous  Once 01/04/15 5784  01/04/15 1039   01/04/15 0930  metroNIDAZOLE (FLAGYL) IVPB 500 mg     500 mg 100 mL/hr over 60 Minutes Intravenous  Once 01/04/15 0922 01/04/15 1058      Objective: Filed Weights   01/05/15 1005 01/06/15 0738 01/07/15 0500  Weight: 90 kg (198 lb 6.6 oz) 90.4 kg (199 lb 4.7 oz) 90.8 kg (200 lb 2.8 oz)    Intake/Output Summary (Last 24 hours) at 01/08/15 1301 Last data filed at 01/07/15 1815  Gross per 24  hour  Intake    416 ml  Output      1 ml  Net    415 ml     Vitals Filed Vitals:   01/07/15 0648 01/07/15 1338 01/07/15 2237 01/08/15 0509  BP: 137/60 152/60 128/87 132/64  Pulse: 64 64 62 65  Temp: 98.4 F (36.9 C) 98.3 F (36.8 C) 98.1 F (36.7 C) 98.3 F (36.8 C)  TempSrc: Oral Oral Oral Oral  Resp: 18 18 20 20   Height:      Weight:      SpO2: 98% 100% 97% 98%    Exam:  General:  Pt is alert, not in acute distress  HEENT: No icterus, No thrush, oral mucosa moist  Cardiovascular: regular rate and rhythm, S1/S2 No murmur  Respiratory: clear to auscultation bilaterally   Abdomen: Soft, +Bowel sounds, tender in left upper quadrant, left lower quadrant, non distended, no guarding  MSK: No LE edema, cyanosis or clubbing  Data Reviewed: Basic Metabolic Panel:  Recent Labs Lab 01/04/15 0658 01/04/15 0735 01/04/15 0843 01/05/15 0512 01/06/15 0115 01/07/15 0433 01/08/15 0349  NA 135 136  --  135 134* 135 137  K 5.7* 5.5* 5.1 4.8 4.3 3.9 3.7  CL 101 99*  --  102 101 102 104  CO2 24  --   --  27 26 25 25   GLUCOSE 245* 238*  --  67 73 77 79  BUN 33* 35*  --  24* 21* 15 13  CREATININE 2.53* 2.40*  --  2.11* 2.02* 1.70* 1.72*  CALCIUM 9.5  --   --  8.7* 8.4* 8.4* 8.6*   Liver Function Tests:  Recent Labs Lab 01/04/15 0658  AST 48*  ALT 29  ALKPHOS 119  BILITOT 1.7*  PROT 7.6  ALBUMIN 3.5   No results for input(s): LIPASE, AMYLASE in the last 168 hours. No results for input(s): AMMONIA in the last 168 hours. CBC:  Recent Labs Lab 01/05/15 0512 01/05/15 1557 01/06/15 0115 01/07/15 0433 01/08/15 0349  WBC 14.8* 13.5* 11.6* 7.6 7.0  HGB 10.7* 9.9* 10.1* 10.2* 10.3*  HCT 32.5* 30.1* 30.8* 30.2* 30.5*  MCV 100.6* 100.0 99.4 99.7 99.3  PLT 208 190 186 204 211   Cardiac Enzymes: No results for input(s): CKTOTAL, CKMB, CKMBINDEX, TROPONINI in the last 168 hours. BNP (last 3 results) No results for input(s): BNP in the last 8760 hours.  ProBNP  (last 3 results)  Recent Labs  03/28/14 0750  PROBNP 4959.0*    CBG:  Recent Labs Lab 01/07/15 1408 01/07/15 1653 01/07/15 2235 01/08/15 0809 01/08/15 1157  GLUCAP 190* 102* 149* 118* 179*    Recent Results (from the past 240 hour(s))  C difficile quick scan w PCR reflex     Status: None   Collection Time: 01/07/15 11:10 PM  Result Value Ref Range Status   C Diff antigen NEGATIVE NEGATIVE Final   C Diff toxin NEGATIVE NEGATIVE Final   C Diff interpretation Negative for  toxigenic C. difficile  Final     Studies: No results found.  Scheduled Meds:  Scheduled Meds: . amiodarone  200 mg Oral Daily  . atorvastatin  10 mg Oral QHS  . bromocriptine  2.5 mg Oral Daily  . calcium carbonate  1 tablet Oral Q breakfast  . ciprofloxacin  500 mg Oral BID  . insulin aspart  0-9 Units Subcutaneous TID WC  . metoprolol  12.5 mg Oral BID  . metroNIDAZOLE  500 mg Oral 3 times per day  . sodium chloride  3 mL Intravenous Q12H   Continuous Infusions:   Time spent on care of this patient: 35 min   [place], MD 01/08/2015, 1:01 PM  LOS: 4 days   [facility] Office  [phone] Pager - Text Page per [email or web address] If 7PM-7AM, please contact night-coverage [email or web address]

## 2015-01-08 NOTE — Progress Notes (Signed)
Subjective: Blood in stool less. Abdominal pain less.  Objective: Vital signs in last 24 hours: Temp:  [98.1 F (36.7 C)-98.3 F (36.8 C)] 98.3 F (36.8 C) (09/25 0509) Pulse Rate:  [62-65] 65 (09/25 0509) Resp:  [18-20] 20 (09/25 0509) BP: (128-152)/(60-87) 132/64 mmHg (09/25 0509) SpO2:  [97 %-100 %] 98 % (09/25 0509) Weight change:  Last BM Date: 01/05/15  PE: GEN:  NAD, pleasant ABD:  Soft, minimal epigastric tenderness, active bowel sounds  Lab Results: CBC    Component Value Date/Time   WBC 7.0 01/08/2015 0349   RBC 3.07* 01/08/2015 0349   HGB 10.3* 01/08/2015 0349   HCT 30.5* 01/08/2015 0349   PLT 211 01/08/2015 0349   MCV 99.3 01/08/2015 0349   MCH 33.6 01/08/2015 0349   MCHC 33.8 01/08/2015 0349   RDW 13.8 01/08/2015 0349   LYMPHSABS 1.0 12/20/2014 0935   MONOABS 0.7 12/20/2014 0935   EOSABS 0.3 12/20/2014 0935   BASOSABS 0.0 12/20/2014 0935   CMP     Component Value Date/Time   NA 137 01/08/2015 0349   K 3.7 01/08/2015 0349   CL 104 01/08/2015 0349   CO2 25 01/08/2015 0349   GLUCOSE 79 01/08/2015 0349   GLUCOSE 120* 04/21/2006 1234   BUN 13 01/08/2015 0349   CREATININE 1.72* 01/08/2015 0349   CALCIUM 8.6* 01/08/2015 0349   PROT 7.6 01/04/2015 0658   ALBUMIN 3.5 01/04/2015 0658   AST 48* 01/04/2015 0658   ALT 29 01/04/2015 0658   ALKPHOS 119 01/04/2015 0658   BILITOT 1.7* 01/04/2015 0658   GFRNONAA 28* 01/08/2015 0349   GFRAA 33* 01/08/2015 0349   Assessment:  1.  Abdominal pain, improving. 2.  Blood in stool, improving.  C. Diff negative.   3.  Abnormal CT scan, distal transverse and proximal descending colon thickening.  Overall suspect ischemic colitis.  Plan:  1.  Advance diet to soft.  Would suggest bland, low fat diet for the next couple weeks. 2.  Transition antibiotics to oral formulation, to complete 10-day total course. 3.  OOBTC, ambulate halls and room as tolerated. 4.  If tolerates advance in diet, and pain and bleeding  continue to improve, patient would likely be able to be discharged home tomorrow from a GI perspective. 5.  [facility] will follow.    Landry Dyke 01/08/2015, 10:47 AM   Pager [phone] If no answer or after 5 PM call [phone]

## 2015-01-09 ENCOUNTER — Telehealth: Payer: Self-pay | Admitting: *Deleted

## 2015-01-09 LAB — CBC
HCT: 31.9 % — ABNORMAL LOW (ref 36.0–46.0)
Hemoglobin: 10.5 g/dL — ABNORMAL LOW (ref 12.0–15.0)
MCH: 32.5 pg (ref 26.0–34.0)
MCHC: 32.9 g/dL (ref 30.0–36.0)
MCV: 98.8 fL (ref 78.0–100.0)
PLATELETS: 222 10*3/uL (ref 150–400)
RBC: 3.23 MIL/uL — AB (ref 3.87–5.11)
RDW: 13.7 % (ref 11.5–15.5)
WBC: 6 10*3/uL (ref 4.0–10.5)

## 2015-01-09 LAB — CUP PACEART REMOTE DEVICE CHECK
Battery Voltage: 2.99 V
Brady Statistic AP VP Percent: 29 %
Brady Statistic AP VS Percent: 71 %
Brady Statistic AS VP Percent: 1 %
Brady Statistic RV Percent Paced: 29 %
Date Time Interrogation Session: 20160919060014
Lead Channel Impedance Value: 810 Ohm
Lead Channel Pacing Threshold Amplitude: 0.5 V
Lead Channel Sensing Intrinsic Amplitude: 12 mV
Lead Channel Setting Pacing Amplitude: 2 V
Lead Channel Setting Pacing Amplitude: 2.5 V
Lead Channel Setting Pacing Pulse Width: 0.4 ms
MDC IDC MSMT BATTERY REMAINING LONGEVITY: 105 mo
MDC IDC MSMT BATTERY REMAINING PERCENTAGE: 95.5 %
MDC IDC MSMT LEADCHNL RA IMPEDANCE VALUE: 390 Ohm
MDC IDC MSMT LEADCHNL RA PACING THRESHOLD AMPLITUDE: 1 V
MDC IDC MSMT LEADCHNL RA PACING THRESHOLD PULSEWIDTH: 0.7 ms
MDC IDC MSMT LEADCHNL RA SENSING INTR AMPL: 1.1 mV
MDC IDC MSMT LEADCHNL RV PACING THRESHOLD PULSEWIDTH: 0.4 ms
MDC IDC PG SERIAL: 7694159
MDC IDC SET LEADCHNL RV SENSING SENSITIVITY: 2 mV
MDC IDC STAT BRADY AS VS PERCENT: 1 %
MDC IDC STAT BRADY RA PERCENT PACED: 99 %
Pulse Gen Model: 2240

## 2015-01-09 LAB — BASIC METABOLIC PANEL
Anion gap: 7 (ref 5–15)
BUN: 12 mg/dL (ref 6–20)
CALCIUM: 8.7 mg/dL — AB (ref 8.9–10.3)
CO2: 25 mmol/L (ref 22–32)
CREATININE: 1.67 mg/dL — AB (ref 0.44–1.00)
Chloride: 107 mmol/L (ref 101–111)
GFR calc non Af Amer: 29 mL/min — ABNORMAL LOW (ref >60)
GFR, EST AFRICAN AMERICAN: 34 mL/min — AB (ref >60)
Glucose, Bld: 90 mg/dL (ref 65–99)
Potassium: 3.8 mmol/L (ref 3.5–5.1)
SODIUM: 139 mmol/L (ref 135–145)

## 2015-01-09 LAB — GI PATHOGEN PANEL BY PCR, STOOL
C difficile toxin A/B: NOT DETECTED
CAMPYLOBACTER BY PCR: NOT DETECTED
Cryptosporidium by PCR: NOT DETECTED
E coli (ETEC) LT/ST: NOT DETECTED
E coli (STEC): NOT DETECTED
E coli 0157 by PCR: NOT DETECTED
G lamblia by PCR: NOT DETECTED
Norovirus GI/GII: NOT DETECTED
ROTAVIRUS A BY PCR: NOT DETECTED
SALMONELLA BY PCR: NOT DETECTED
SHIGELLA BY PCR: NOT DETECTED

## 2015-01-09 LAB — GLUCOSE, CAPILLARY
Glucose-Capillary: 113 mg/dL — ABNORMAL HIGH (ref 65–99)
Glucose-Capillary: 148 mg/dL — ABNORMAL HIGH (ref 65–99)

## 2015-01-09 MED ORDER — POTASSIUM CHLORIDE CRYS ER 10 MEQ PO TBCR: 10.0000 meq | EXTENDED_RELEASE_TABLET | Freq: Every day | ORAL | Status: DC

## 2015-01-09 MED ORDER — METRONIDAZOLE 500 MG PO TABS: 500.0000 mg | ORAL_TABLET | Freq: Three times a day (TID) | ORAL | Status: DC

## 2015-01-09 MED ORDER — FUROSEMIDE 20 MG PO TABS: 20.0000 mg | ORAL_TABLET | Freq: Every day | ORAL | Status: DC | PRN

## 2015-01-09 MED ORDER — CIPROFLOXACIN HCL 500 MG PO TABS: 500.0000 mg | ORAL_TABLET | Freq: Two times a day (BID) | ORAL | Status: DC

## 2015-01-09 MED ORDER — FUROSEMIDE 20 MG PO TABS: 20.0000 mg | ORAL_TABLET | Freq: Two times a day (BID) | ORAL | Status: DC | PRN

## 2015-01-09 NOTE — Care Management Important Message (Signed)
Important Message  Patient Details  Name: Becky Forbes MRN: [id number] Date of Birth: 22-Apr-1940   Medicare Important Message Given:  Yes-second notification given    [name] 01/09/2015, 3:36 PM

## 2015-01-09 NOTE — Discharge Summary (Signed)
Physician Discharge Summary  Becky Forbes VFM:[id number] DOB: [birth year] DOA: 01/04/2015  PCP: Unice Cobble, MD  Admit date: 01/04/2015 Discharge date: 01/09/2015  Time spent: 60 minutes  Recommendations for Outpatient Follow-up:  1. CBC and be met in 1 week  Discharge Condition: Stable  Discharge Diagnoses:  Principal Problem:   BRBPR (bright red blood per rectum) Active Problems:   HYPERLIPIDEMIA   Nonspecific abnormal results of thyroid function study   Long term current use of anticoagulant therapy   S/P cardiac pacemaker procedure, [employer] Jude device   Chronic combined systolic and diastolic CHF (congestive heart failure)   Paroxysmal a-fib   Colitis, acute   Benign essential HTN   Acute renal failure superimposed on stage 3 chronic kidney disease   Leukocytosis   Diabetes mellitus with diabetic nephropathy   History of present illness:  Becky Forbes is a 74 y.o. female with atrial fibrillation on chronic anticoagulation with Coumadin, GI bleed in the past with endoscopy revealing internal and external hemorrhoids and diverticulosis. She presents to the Hospital with a complaint of 2 days of bloody stools and abdominal pain. CT scan performed in the ER reveals colitis of the transverse and descending colon.   Hospital Course:  Principal Problem:  BRBPR (bright red blood per rectum) acute colitis/ leukocytosis -Previous endoscopy revealing internal and external hemorrhoids and diverticulosis - -Have sent GI pathogen on 9/22 panel which is still pending- I will follow-up on this - C. difficile PCR negative -Current bleeding is secondary to acute colitis as noted on CT imaging on admission- GI has been consulted as well-their recommendations are to complete a 10 day course of ciprofloxacin and Flagyl -Holding Coumadin at this time- bleeding has resolved as of yesterday. Abdominal pain has resolved as well and she is tolerating a regular diet.  Active Problems: Anemia of  chronic disease -No drop in hemoglobin noted with above-mentioned blood loss  Acute on chronic renal failure stage III -Steadily improving with holding Lasix and Altace - Baseline Cr 1.6- 1.7 - She states that she takes Lasix for swelling of her ankles-2-D echo does reveal that she has diastolic dysfunction-at this point I have recommended that she take Lasix as needed for weight gain and for pedal edema- detailed instructions given  Paroxysmal atrial fibrillation -Continue amiodarone and metoprolol -Coumadin on hold-cardiology has evaluated the patient in the hospital and recommends continuing to hold "indefinitely" or until she has another episode of atrial fibrillation -I have recommended that she follow-up with her primary cardiologist this Friday to determine if she is safe to resume Coumadin  Hypotension with a history of hypertension - continue metoprolol and Altace- Altace was temporarily held during the hospital stay due to acute renal failure   Chronic combined systolic and diastolic CHF (congestive heart failure) -Last echo 12/15 revealed an EF of 50-55% with elevated end-diastolic filling pressures and elevated PA pressures -Monitor closely while holding Lasix   Diabetes mellitus with diabetic nephropathy -Hold Prandin for now as PO intake may not be as adequate as home intake - -Continue to follow on sliding scale insulin   S/P cardiac pacemaker procedure, [employer]. Jude device    Consultations:  GI, cardiology  Discharge Exam: Filed Weights   01/07/15 0500 01/08/15 1500 01/09/15 0513  Weight: 90.8 kg (200 lb 2.8 oz) 87.4 kg (192 lb 10.9 oz) 91 kg (200 lb 9.9 oz)   Filed Vitals:   01/09/15 1007  BP:   Pulse: 69  Temp:   Resp:  General: AAO x 3, no distress Cardiovascular: RRR, no murmurs  Respiratory: clear to auscultation bilaterally GI: soft, non-tender, non-distended, bowel sound positive  Discharge Instructions You were cared for by a hospitalist  during your hospital stay. If you have any questions about your discharge medications or the care you received while you were in the hospital after you are discharged, you can call the unit and asked to speak with the hospitalist on call if the hospitalist that took care of you is not available. Once you are discharged, your primary care physician will handle any further medical issues. Please note that NO REFILLS for any discharge medications will be authorized once you are discharged, as it is imperative that you return to your primary care physician (or establish a relationship with a primary care physician if you do not have one) for your aftercare needs so that they can reassess your need for medications and monitor your lab values.      Discharge Instructions    (HEART FAILURE PATIENTS) Call MD:  Anytime you have any of the following symptoms: 1) 3 pound weight gain in 24 hours or 5 pounds in 1 week 2) shortness of breath, with or without a dry hacking cough 3) swelling in the hands, feet or stomach 4) if you have to sleep on extra pillows at night in order to breathe.    Complete by:  As directed      Discharge instructions    Complete by:  As directed   Please see your cardiologist by Friday to determine if the Coumadin needs to be resumed Diabetic heart healthy, low sodium diet     Increase activity slowly    Complete by:  As directed             Medication List    STOP taking these medications        warfarin 2.5 MG tablet  Commonly known as:  COUMADIN      TAKE these medications        amiodarone 200 MG tablet  Commonly known as:  PACERONE  Take 1 tablet (200 mg total) by mouth daily.     atorvastatin 10 MG tablet  Commonly known as:  LIPITOR  Take 10 mg by mouth daily.     bromocriptine 2.5 MG tablet  Commonly known as:  PARLODEL  Take 1 tablet (2.5 mg total) by mouth daily.     calcium carbonate 600 MG Tabs tablet  Commonly known as:  OS-CAL  Take 600 mg by mouth  daily.     furosemide 20 MG tablet  Commonly known as:  LASIX  Take 1 tablet (20 mg total) by mouth daily as needed for fluid or edema.     glucose blood test strip  Commonly known as:  BAYER CONTOUR NEXT TEST  Use as instructed     metoprolol 50 MG tablet  Commonly known as:  LOPRESSOR  Take 1 tablet (50 mg total) by mouth 2 (two) times daily.     multivitamin with minerals Tabs tablet  Take 1 tablet by mouth daily.     potassium chloride 10 MEQ tablet  Commonly known as:  KLOR-CON M10  Take 1 tablet (10 mEq total) by mouth daily. Only when taking the lasix     ramipril 5 MG capsule  Commonly known as:  ALTACE  Take 1 capsule (5 mg total) by mouth 2 (two) times daily.     repaglinide 2 MG tablet  Commonly known  as:  PRANDIN  Take 1 tablet (2 mg total) by mouth 3 (three) times daily before meals.     VITAMIN D PO  Take 800 Units by mouth daily.       Allergies  Allergen Reactions  . Morphine And Related Itching, Nausea And Vomiting and Rash    01/04/15- patient says she is NOT allergic to morphine.  Morphine IV given this AM 01/04/15 in the ED. No adverse reaction.  . Actos [Pioglitazone Hydrochloride] Swelling  . Januvia [Sitagliptin Phosphate] Other (See Comments)    Pt says this caused elevated liver enzymes and creatinine  . Percocet [Oxycodone-Acetaminophen] Nausea And Vomiting  . Hydrocodone-Acetaminophen Itching and Nausea And Vomiting   Follow-up Information    Follow up with Unice Cobble, MD In 1 week.   Specialty:  Internal Medicine   Why:  needs CBC and Bmet   Contact information:   [address] [address] [phone]        The results of significant diagnostics from this hospitalization (including imaging, microbiology, ancillary and laboratory) are listed below for reference.    Significant Diagnostic Studies: Ct Abdomen Pelvis Wo Contrast  01/04/2015   CLINICAL DATA:  Rectal bleeding since midnight. Bilateral lower abdominal  pain.  EXAM: CT ABDOMEN AND PELVIS WITHOUT CONTRAST  TECHNIQUE: Multidetector CT imaging of the abdomen and pelvis was performed following the standard protocol without IV contrast.  COMPARISON:  None.  FINDINGS: Lower chest: Mild chronic interstitial lung disease. Multi vessel coronary artery atherosclerosis partially visualized.  Hepatobiliary: Normal liver. No focal of attic mass. Cholelithiasis. No intrahepatic or extrahepatic biliary ductal dilatation.  Pancreas: Normal.  Spleen: Normal.  Adrenals/Urinary Tract: Normal adrenal glands. Normal kidneys. No urolithiasis or obstructive uropathy. Normal bladder.  Stomach/Bowel: No bowel dilatation to suggest obstruction. Bowel wall thickening involving the distal transverse colon and descending colon with mild pericolonic inflammatory changes. There is diverticulosis without evidence of diverticulitis. No pneumatosis, pneumoperitoneum or portal venous gas. No abdominal or pelvic free fluid. Moderate size hiatal hernia.  Vascular/Lymphatic: Normal abdominal aorta with atherosclerosis. No abdominal or pelvic lymphadenopathy.  Reproductive: Prior hysterectomy.  No adnexal mass.  Other: No focal fluid collection or hematoma.  Musculoskeletal: No acute osseous abnormality. No lytic or sclerotic osseous lesion. Posterior spinal fusion from L3 through S1. Degenerative disc disease of the lower thoracic spine.  IMPRESSION: 1. Bowel wall thickening involving the distal transverse and descending colon most consistent with colitis secondary to an infectious, inflammatory or ischemic etiology. 2. Cholelithiasis.   Electronically Signed   By: Kathreen Devoid   On: 01/04/2015 09:10    Microbiology: Recent Results (from the past 240 hour(s))  C difficile quick scan w PCR reflex     Status: None   Collection Time: 01/07/15 11:10 PM  Result Value Ref Range Status   C Diff antigen NEGATIVE NEGATIVE Final   C Diff toxin NEGATIVE NEGATIVE Final   C Diff interpretation Negative  for toxigenic C. difficile  Final     Labs: Basic Metabolic Panel:  Recent Labs Lab 01/05/15 0512 01/06/15 0115 01/07/15 0433 01/08/15 0349 01/09/15 0350  NA 135 134* 135 137 139  K 4.8 4.3 3.9 3.7 3.8  CL 102 101 102 104 107  CO2 _0 GLUCOSE 67 73 77 79 90  BUN 24* 21* _1 CREATININE 2.11* 2.02* 1.70* 1.72* 1.67*  CALCIUM 8.7* 8.4* 8.4* 8.6* 8.7*   Liver Function Tests:  Recent Labs Lab  01/04/15 0658  AST 48*  ALT 29  ALKPHOS 119  BILITOT 1.7*  PROT 7.6  ALBUMIN 3.5   No results for input(s): LIPASE, AMYLASE in the last 168 hours. No results for input(s): AMMONIA in the last 168 hours. CBC:  Recent Labs Lab 01/05/15 1557 01/06/15 0115 01/07/15 0433 01/08/15 0349 01/09/15 0350  WBC 13.5* 11.6* 7.6 7.0 6.0  HGB 9.9* 10.1* 10.2* 10.3* 10.5*  HCT 30.1* 30.8* 30.2* 30.5* 31.9*  MCV 100.0 99.4 99.7 99.3 98.8  PLT 190 186 204 211 222   Cardiac Enzymes: No results for input(s): CKTOTAL, CKMB, CKMBINDEX, TROPONINI in the last 168 hours. BNP: BNP (last 3 results) No results for input(s): BNP in the last 8760 hours.  ProBNP (last 3 results)  Recent Labs  03/28/14 0750  PROBNP 4959.0*    CBG:  Recent Labs Lab 01/08/15 0809 01/08/15 1157 01/08/15 1704 01/08/15 2107 01/09/15 0752  GLUCAP 118* 179* 111* 191* 113*       SignedDebbe Odea, MD [facility] 01/09/2015, 10:39 AM

## 2015-01-09 NOTE — Progress Notes (Signed)
Physical Therapy Treatment Patient Details Name: Becky Forbes MRN: [id number] DOB: [birth year] Today's Date: 01/09/2015    History of Present Illness Patient is a 74 y/o female admitted with vomiting, diarrhea, and rectal bleeding. Abdominal CT scan was done which showed evidence of colitis in the distal transverse and in the descending colon. PMH includes HTN, HLD, PAF,DM, SSS, pacemaker.     PT Comments    Mrs. Diener tolerated high level balance exercises w/ occasional LOB as noted below.  She continues to refuse use of AD (preferrably a cane) but is agreeable to follow up w/ OPPT upon d/c.    Follow Up Recommendations  Outpatient PT;Supervision for mobility/OOB     Equipment Recommendations  Other (comment) (Recommended cane, pt refuses)    Recommendations for Other Services       Precautions / Restrictions Precautions Precautions: Fall Restrictions Weight Bearing Restrictions: No    Mobility  Bed Mobility               General bed mobility comments: Pt up in recliner upon PT arrival  Transfers Overall transfer level: Needs assistance Equipment used: None Transfers: Sit to/from Stand Sit to Stand: Supervision         General transfer comment: Supervision for safety.  Pt w/ safe technique  Ambulation/Gait Ambulation/Gait assistance: Min assist Ambulation Distance (Feet): 150 Feet Assistive device: None Gait Pattern/deviations: Step-through pattern;Antalgic;Staggering left;Staggering right   Gait velocity interpretation: at or above normal speed for age/gender General Gait Details: Staggering Lt and Rt w/ head turns Bil and w/ walking on straight line.  Pt loses her balance and requires min assist to stabilize.     Stairs            Wheelchair Mobility    Modified Rankin (Stroke Patients Only)       Balance Overall balance assessment: Needs assistance Sitting-balance support: No upper extremity supported;Feet supported Sitting balance-Leahy  Scale: Good     Standing balance support: Bilateral upper extremity supported;During functional activity Standing balance-Leahy Scale: Fair Standing balance comment: Pt requires min assist at times when balance is challenged  Single Leg Stance - Right Leg: 30 (30 sec w/ support of bil UEs on counter w/ eyes closed) Single Leg Stance - Left Leg: 30 (30 sec w/ support of bil UEs on counter w/ eyes closed)           High Level Balance Comments: Perturbations applied to pt during SLS exercise w/ eyes open w/ LOB x1 w/ Rt SLS.  Pt able to stabilize holding onto counter.  Pt w/ instability w/ head turns, walking on straight line, and stepping over objects during ambulation in hallway.    Cognition Arousal/Alertness: Awake/alert Behavior During Therapy: WFL for tasks assessed/performed Overall Cognitive Status: Within Functional Limits for tasks assessed                      Exercises General Exercises - Lower Extremity Mini-Sqauts: Strengthening;Both;10 reps;Standing    General Comments General comments (skin integrity, edema, etc.): Encouraged pt to go to OPPT to address balance deficits noted during PT session.      Pertinent Vitals/Pain Pain Assessment: No/denies pain Pain Intervention(s): Limited activity within patient's tolerance;Monitored during session    Home Living                      Prior Function            PT Goals (current goals can now  be found in the care plan section) Acute Rehab PT Goals Patient Stated Goal: to go home today PT Goal Formulation: With patient Time For Goal Achievement: 01/18/15 Potential to Achieve Goals: Good Progress towards PT goals: Progressing toward goals    Frequency  Min 3X/week    PT Plan Current plan remains appropriate    Co-evaluation             End of Session Equipment Utilized During Treatment: Gait belt Activity Tolerance: Patient tolerated treatment well Patient left: in chair;with call  bell/phone within reach     Time: 1112-1123 PT Time Calculation (min) (ACUTE ONLY): 11 min  Charges:  $Neuromuscular Re-education: 8-22 mins                    G Codes:      Joslyn Hy PT, DPT [phone] Pager: [phone] 01/09/2015, 1:00 PM

## 2015-01-09 NOTE — Telephone Encounter (Signed)
Pt called to notify us that she is home today from Hospital with Rectal Bleed  Acute Colitis and is holding coumadin until instructed by her primary cardiologist to resume coumadin and she has an order to be seen by her Primary Cardiologist on Friday Sept 30th. Her appt in the coumadin clinic for tomorrow has been cancelled  PT instructed to call if she has any problem in getting an appt to be seen by cardiologist on Friday and she states understanding

## 2015-01-09 NOTE — Progress Notes (Signed)
Becky Forbes to be D/C'd Home per MD order.  Discussed with the patient and all questions fully answered.  VSS, Skin clean, dry and intact without evidence of skin break down, no evidence of skin tears noted. IV catheter discontinued intact. Site without signs and symptoms of complications. Dressing and pressure applied.  An After Visit Summary was printed and given to the patient. Prescriptions called in to patient's pharmacy.  D/c education completed with patient/family including follow up instructions, medication list, d/c activities limitations if indicated, with other d/c instructions as indicated by MD - patient able to verbalize understanding, all questions fully answered.   Patient instructed to return to ED, call 911, or call MD for any changes in condition.   Patient escorted via [place], and D/C home via private auto.  Becky Forbes 01/09/2015 10:54 AM

## 2015-01-10 ENCOUNTER — Telehealth: Payer: Self-pay | Admitting: *Deleted

## 2015-01-10 NOTE — Telephone Encounter (Signed)
Transition Care Management Follow-up Telephone Call   Date discharged? 01/09/15   How have you been since you were released from the hospital? Pt states she is doing ok   Do you understand why you were in the hospital? YES   Do you understand the discharge instructions? YES   Where were you discharged to? Home   Items Reviewed:  Medications reviewed: YES  Allergies reviewed: YES  Dietary changes reviewed: NO  Referrals reviewed: YES, pt states she has appt with cardiologist 01/12/15, still waiting on appt with GI   Functional Questionnaire:   Activities of Daily Living (ADLs):   She states they are independent in the following: ambulation, bathing and hygiene, feeding, continence, grooming, toileting and dressing States she doesn't  require assistance    Any transportation issues/concerns?: YES   Any patient concerns? NO   Confirmed importance and date/time of follow-up visits scheduled YES, made for 01/16/15  Provider Appointment booked with Dr. Linna Darner  Confirmed with patient if condition begins to worsen call PCP or go to the ER.  Patient was given the office number and encouraged to call back with question or concerns.  : YES

## 2015-01-11 ENCOUNTER — Ambulatory Visit (INDEPENDENT_AMBULATORY_CARE_PROVIDER_SITE_OTHER): Payer: Medicare Other | Admitting: Endocrinology

## 2015-01-11 ENCOUNTER — Encounter: Payer: Self-pay | Admitting: Endocrinology

## 2015-01-11 VITALS — BP 144/87 | HR 98 | Temp 97.9°F | Ht 64.0 in | Wt 197.0 lb

## 2015-01-11 DIAGNOSIS — E1121 Type 2 diabetes mellitus with diabetic nephropathy: Secondary | ICD-10-CM

## 2015-01-11 LAB — POCT GLYCOSYLATED HEMOGLOBIN (HGB A1C): HEMOGLOBIN A1C: 6.9

## 2015-01-11 MED ORDER — REPAGLINIDE 2 MG PO TABS: 2.0000 mg | ORAL_TABLET | Freq: Three times a day (TID) | ORAL | Status: DC

## 2015-01-11 MED ORDER — GLUCOSE BLOOD VI STRP: 1.0000 | ORAL_STRIP | Freq: Every day | Status: DC

## 2015-01-11 NOTE — Addendum Note (Signed)
Addended by: Moody Bruins E on: 01/11/2015 09:04 AM   Modules accepted: Orders

## 2015-01-11 NOTE — Progress Notes (Signed)
Subjective:    Patient ID: Becky Forbes, female    DOB: 05/15/1940, 74 y.o.   MRN: [id number]  HPI Pt returns for f/u of diabetes mellitus: DM type: 2 Dx'ed: 1324 Complications: nephropathy, PAD, and retinopathy. Therapy: 2 oral meds GDM: never DKA: never Severe hypoglycemia: never.   Pancreatitis: never. Other: she is not on metformin due to renal insuff; she had edema on pioglitizone, and diarrhea on acarbose; she could not afford invokana.  She stopped welchol due to lack of effect.  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 Interval hx: she was recently in the hospital with [facility].  She is back on prandin since hosp d/c.  pt states she feels well in general.  She denies hypoglycemia.    Past Medical History  Diagnosis Date  . HTN (hypertension)   . Hyperlipemia   . Diabetes mellitus     x 10 yrs  . PAF (paroxysmal atrial fibrillation)   . Symptomatic sinus bradycardia     a. [place] DR  dual-chamber pacemaker 03/2014 by Dr. Rayann Heman.  . Chronic combined systolic and diastolic CHF (congestive heart failure)   . Typical atrial flutter   . Sick sinus syndrome   . CKD (chronic kidney disease), stage III   . GI bleed     a. 04/2012 - lower GIB - felt 2/2 mild ischemic colitis as shown on colonoscopy, was cleared for anticoag 1 week out from procedure.  . Ischemic colitis     a. 04/2012 - lower GIB - felt 2/2 mild ischemic colitis as shown on colonoscopy..  . Anemia   . Carotid artery disease     a. Carotid duplex in 2010: 40-10% RICA, 27-25% LICA.  Marland Kitchen Renal insufficiency   . Complication of anesthesia   . PONV (postoperative nausea and vomiting)   . Presence of permanent cardiac pacemaker 03/2014    Past Surgical History  Procedure Laterality Date  . Cataract extraction  05/25/07    RIGHT EYE  . Appendectomy    . Carpal tunnel release    . Abdominal hysterectomy    . Lumbar laminectomy    . Foot surgery  2006    MID FOOT  RECONSTRUCTION  . Osteotomy  2006    AND FUSION  . Ganglion cyst excision  12/2007    Dr.Gramig  . Total knee arthroplasty  05/02/08, 07/25/08    Dr.Alusio  . Joint replacement      bilateral  . 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 Colonoscopy  01/31/2009    small external/internal hemorrhoids, diverticulosis in sigmoid colon, one small polyp (biopsied). Biospy essentially normal. Dr.Magod  . Colonoscopy  04/30/2012    Procedure: COLONOSCOPY;  Surgeon: Cleotis Nipper, MD;  Location: [facility] ENDOSCOPY;  Service: Endoscopy;  Laterality: N/A;  unprepped, poss flex only  . Permanent pacemaker insertion N/A 03/29/2014    STJ Assurity dual chamber paceamker implnated by Dr Rayann Heman    Social History   Social History  . Marital Status: Married    Spouse Name: Jeneen Rinks  . Number of Children: 2  . Years of Education: N/A   Occupational History  . 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 Smoking status: Never Smoker   . Smokeless tobacco: Never Used  . Alcohol Use: No  . Drug Use: No  . Sexual Activity:  Not Currently   Other Topics Concern  . Not on file   Social History Narrative    Current Outpatient Prescriptions on File Prior to Visit  Medication Sig Dispense Refill  . amiodarone (PACERONE) 200 MG tablet Take 1 tablet (200 mg total) by mouth daily. 90 tablet 3  . atorvastatin (LIPITOR) 10 MG tablet Take 10 mg by mouth daily.    . bromocriptine (PARLODEL) 2.5 MG tablet Take 1 tablet (2.5 mg total) by mouth daily. 180 tablet 1  . calcium carbonate (OS-CAL) 600 MG TABS Take 600 mg by mouth daily.     . Cholecalciferol (VITAMIN D PO) Take 800 Units by mouth daily.      . ciprofloxacin (CIPRO) 500 MG tablet Take 1 tablet (500 mg total) by mouth 2 (two) times daily. 8 tablet 0  . furosemide (LASIX) 20 MG tablet Take 1 tablet (20 mg total) by mouth daily as needed for fluid or edema. 90 tablet 1  . metoprolol (LOPRESSOR) 50 MG  tablet Take 1 tablet (50 mg total) by mouth 2 (two) times daily. 180 tablet 1  . metroNIDAZOLE (FLAGYL) 500 MG tablet Take 1 tablet (500 mg total) by mouth every 8 (eight) hours. 12 tablet 0  . Multiple Vitamin (MULITIVITAMIN WITH MINERALS) TABS Take 1 tablet by mouth daily.    . potassium chloride (KLOR-CON M10) 10 MEQ tablet Take 1 tablet (10 mEq total) by mouth daily. Only when taking the lasix 30 tablet 11  . ramipril (ALTACE) 5 MG capsule Take 1 capsule (5 mg total) by mouth 2 (two) times daily. 180 capsule 5   No current facility-administered medications on file prior to visit.    Allergies  Allergen Reactions  . Morphine And Related Itching, Nausea And Vomiting and Rash    01/04/15- patient says she is NOT allergic to morphine.  Morphine IV given this AM 01/04/15 in the ED. No adverse reaction.  . Actos [Pioglitazone Hydrochloride] Swelling  . Januvia [Sitagliptin Phosphate] Other (See Comments)    Pt says this caused elevated liver enzymes and creatinine  . Percocet [Oxycodone-Acetaminophen] Nausea And Vomiting  . Hydrocodone-Acetaminophen Itching and Nausea And Vomiting    Family History  Problem Relation Age of Onset  . Coronary artery disease Father   . Hypertension Father   . Stroke Father   . Diabetes Father   . Kidney failure Mother   . Hypertension Mother   . Other Brother     SEPSIS  . Diabetes Brother   . Diabetes Sister   . Kidney failure Sister     BP 144/87 mmHg  Pulse 98  Temp(Src) 97.9 F (36.6 C) (Oral)  Ht 5\' 4"  (1.626 m)  Wt 197 lb (89.359 kg)  BMI 33.80 kg/m2  SpO2 95%    Review of Systems She has lost a few lbs.      Objective:   Physical Exam VITAL SIGNS:  See vs page GENERAL: no distress Pulses: dorsalis pedis intact bilat.   MSK: no deformity of the feet CV: no leg edema Skin:  no ulcer on the feet.  normal color and temp on the feet. Neuro: sensation is intact to touch on the feet.  Ext: There is bilateral onychomycosis of the  toenails.    A1c=6.9%    Assessment & Plan:  DM: well-controlled  Patient is advised the following: Patient Instructions  check your blood sugar once a day.  vary the time of day when you check, between before the 3 meals, and  at bedtime.  also check if you have symptoms of your blood sugar being too high or too low.  please keep a record of the readings and bring it to your next appointment here.  please call us sooner if your blood sugar goes below 70, or if it stays over 200.  Please continue the same diabetes medications.  Please come back for a follow-up appointment in 4-5 months.

## 2015-01-11 NOTE — Patient Instructions (Signed)
check your blood sugar once a day.  vary the time of day when you check, between before the 3 meals, and at bedtime.  also check if you have symptoms of your blood sugar being too high or too low.  please keep a record of the readings and bring it to your next appointment here.  please call us sooner if your blood sugar goes below 70, or if it stays over 200.  Please continue the same diabetes medications.  Please come back for a follow-up appointment in 4-5 months.

## 2015-01-12 ENCOUNTER — Ambulatory Visit (INDEPENDENT_AMBULATORY_CARE_PROVIDER_SITE_OTHER): Payer: Medicare Other | Admitting: Cardiovascular Disease

## 2015-01-12 ENCOUNTER — Encounter: Payer: Self-pay | Admitting: Cardiovascular Disease

## 2015-01-12 VITALS — BP 132/68 | HR 62 | Ht 64.0 in | Wt 197.8 lb

## 2015-01-12 DIAGNOSIS — I480 Paroxysmal atrial fibrillation: Secondary | ICD-10-CM

## 2015-01-12 DIAGNOSIS — I5042 Chronic combined systolic (congestive) and diastolic (congestive) heart failure: Secondary | ICD-10-CM

## 2015-01-12 DIAGNOSIS — Z1231 Encounter for screening mammogram for malignant neoplasm of breast: Secondary | ICD-10-CM | POA: Diagnosis not present

## 2015-01-12 LAB — HM MAMMOGRAPHY: HM Mammogram: NEGATIVE

## 2015-01-12 NOTE — Patient Instructions (Addendum)
Medication Instructions:  Your physician recommends that you continue on your current medications as directed. Please refer to the Current Medication list given to you today.   Labwork: None Ordered   Testing/Procedures: None Ordered   Follow-Up: Your physician wants you to follow-up in: 4 months with Dr. Acie Fredrickson.  You will receive a reminder letter in the mail two months in advance. If you don't receive a letter, please call our office to schedule the follow-up appointment.

## 2015-01-12 NOTE — Progress Notes (Signed)
Cardiology Office Note   Date:  01/12/2015   ID:  Becky Forbes, DOB 10-28-1940, MRN [id number]  PCP:  Unice Cobble, MD  Cardiologist:   Acie Fredrickson Wonda Cheng, MD   Chief Complaint  Patient presents with  . Congestive Heart Failure    post hospital follow up, GI bleeding while on coumadin   1. Atrial fibrillation / atrial Flutter - paroxysmal 2. Diabetes mellitus 3. Hypertension 4. Hyperlipidemia 5. Chronic systolic CHF - EF 40-10% (assumed to be nonischemic) 6. S/p pacer :    History of Present Illness:  [name] not had any episodes of chest pain or shortness of breath. She had back surgery earlier this year. [name] not had any chest pain or shortness breath. Her blood pressure has been well controlled. [name] not had any episodes of atrial fibrillation.  Feb. 4, 2014: She was admitted to the hospital in mid January with a GI bleed and was noted to be in atrial fibrillation. She had an echocardiogram which revealed an ejection fraction of 35-40%. She started on Lasix 20 twice a day and metoprolol. She had some blood work drawn last week which revealed a mild increase in her creatinine up to 1.9. She was also found to have a potassium of 6.1. Recheck potassium level was 4.6. [name] noted that her blood pressures been quite low since she left the hospital. She also was having symptoms of orthostatic hypotension.  July 23, 2012:  Becky Forbes is doing well. No further episodes of Afib and no blood in stool. She was admitted to the hospital in January with GI bleeding was found to have atrial fibrillation. Workup at that time revealed what appeared to be ischemic colitis. Her aspirin was held for a week. She improved and has not had any further bleeding. We have been able to restart Coumadin she's not had any bleeding. The bruising that she was having is now better since she has been off the aspirin.  She has a nonischemic cardiomyopathy. Echocardiogram in January revealed an ejection fraction  of 35-40%. She has moderate pulmonary hypertension with an estimated PA pressure of 49. I have reviewed the echo and the results are noted below. Left ventricle: The cavity size was normal. Wall thickness was increased in a pattern of mild LVH. Systolic function was moderately reduced. The estimated ejection fraction was in the range of 35% to 40%. Diffuse hypokinesis. - Mitral valve: Mild regurgitation. - Left atrium: The atrium was mildly dilated. - Right ventricle: The cavity size was mildly dilated. Systolic function was mildly reduced. - Right atrium: The atrium was mildly dilated. - Pulmonary arteries: Systolic pressure was moderately increased. PA peak pressure: 8mm Hg   She continues to have generalized fatigue and malaise.  She has chronic renal insufficiency.  Oct. 23, 2014:  Becky Forbes is in atrial flutter today. No symptoms. breathing is ok. She walks on occasion. [name] not able to tell if her heart rate is irregular or not.  August 03, 2013:   Becky Forbes is doing well. No Cp or dyspnea. Perhaps mild DOE with walking  Echo April 2015 showed improved LV function.  Left ventricle: The cavity size was normal. Systolic function was mildly to moderately reduced. The estimated ejection fraction was in the range of 40% to 45%. Severe hypokinesis of the basal-midinferolateral and inferior myocardium. The study is not technically sufficient to allow evaluation of LV diastolic function. - Mitral valve: Calcified annulus. Mild to moderate regurgitation directed centrally. - Left atrium: The atrium was severely  dilated. - Right atrium: The atrium was moderately dilated  September 22, 2013: Becky Forbes is feeling a little bit better. She seems to be tolerating the amiodarone better than the high dose of metoprolol. [name] not having episodes of chest pain. She thinks her heart rate is better.  Her blood pressure here in the office is a little bit elevated. Her blood pressures at home have been  all in the normal range.  Sept. 10, 2015:  Becky Forbes is feeling quite well. She has not noticed any heart rate irregularities. She has felt a lot better since decreasing the dose of amiodarone to 200 mg a day.   Feb. 25, 2016:   Becky Forbes is a 74 y.o. female who presents for follow-up of her atrial fibrillation. Since I last saw her she's had a pacemaker placed.  She is feeling well.  Has some tenderness and " lumpyness around the pacer for the past week.    still has some dizziness.   12/13/2014: Still has lack of energy  Fatigues with walking .    06/13/2014:  Becky Forbes presents today following a hospitalization for GI bleed. She was admitted on September 21 with an acute GI bleed. This was despite the fact that she had been off her Coumadin for the previous 7 days and her INR was only 1.8. She was stabilized. She is seen back today.  [name] feeling better and has not had any further bleeding.  She has maintained NSR.    Past Medical History  Diagnosis Date  . HTN (hypertension)   . Hyperlipemia   . Diabetes mellitus     x 10 yrs  . PAF (paroxysmal atrial fibrillation)   . Symptomatic sinus bradycardia     a. [place] DR  dual-chamber pacemaker 03/2014 by Dr. Rayann Heman.  . Chronic combined systolic and diastolic CHF (congestive heart failure)   . Typical atrial flutter   . Sick sinus syndrome   . CKD (chronic kidney disease), stage III   . GI bleed     a. 04/2012 - lower GIB - felt 2/2 mild ischemic colitis as shown on colonoscopy, was cleared for anticoag 1 week out from procedure.  . Ischemic colitis     a. 04/2012 - lower GIB - felt 2/2 mild ischemic colitis as shown on colonoscopy..  . Anemia   . Carotid artery disease     a. Carotid duplex in 2010: 09-73% RICA, 53-29% LICA.  Marland Kitchen Renal insufficiency   . Complication of anesthesia   . PONV (postoperative nausea and vomiting)   . Presence of permanent cardiac pacemaker 03/2014    Past Surgical History  Procedure  Laterality Date  . Cataract extraction  05/25/07    RIGHT EYE  . Appendectomy    . Carpal tunnel release    . Abdominal hysterectomy    . Lumbar laminectomy    . Foot surgery  2006    MID FOOT RECONSTRUCTION  . Osteotomy  2006    AND FUSION  . Ganglion cyst excision  12/2007    Dr.Gramig  . Total knee arthroplasty  05/02/08, 07/25/08    Dr.Alusio  . Joint replacement      bilateral  . 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 Colonoscopy  01/31/2009    small external/internal hemorrhoids, diverticulosis in sigmoid colon, one small polyp (biopsied). Biospy essentially normal. Dr.Magod  . Colonoscopy  04/30/2012    Procedure: COLONOSCOPY;  Surgeon: Cleotis Nipper, MD;  Location: [facility] ENDOSCOPY;  Service: Endoscopy;  Laterality: N/A;  unprepped, poss flex only  . Permanent pacemaker insertion N/A 03/29/2014    STJ Assurity dual chamber paceamker implnated by Dr Rayann Heman     Current Outpatient Prescriptions  Medication Sig Dispense Refill  . amiodarone (PACERONE) 200 MG tablet Take 1 tablet (200 mg total) by mouth daily. 90 tablet 3  . atorvastatin (LIPITOR) 10 MG tablet Take 10 mg by mouth daily.    . bromocriptine (PARLODEL) 2.5 MG tablet Take 1 tablet (2.5 mg total) by mouth daily. 180 tablet 1  . calcium carbonate (OS-CAL) 600 MG TABS Take 600 mg by mouth daily.     . Cholecalciferol (VITAMIN D PO) Take 800 Units by mouth daily.      . ciprofloxacin (CIPRO) 500 MG tablet Take 1 tablet (500 mg total) by mouth 2 (two) times daily. 8 tablet 0  . furosemide (LASIX) 20 MG tablet Take 1 tablet (20 mg total) by mouth daily as needed for fluid or edema. 90 tablet 1  . glucose blood (BAYER CONTOUR NEXT TEST) test strip 1 each by Other route daily. And lancets 1/day 100 each 12  . metoprolol (LOPRESSOR) 50 MG tablet Take 1 tablet (50 mg total) by mouth 2 (two) times daily. 180 tablet 1  . metroNIDAZOLE (FLAGYL) 500 MG tablet Take 1 tablet (500 mg total) by mouth every 8 (eight) hours.  12 tablet 0  . Multiple Vitamin (MULITIVITAMIN WITH MINERALS) TABS Take 1 tablet by mouth daily.    . potassium chloride (KLOR-CON M10) 10 MEQ tablet Take 1 tablet (10 mEq total) by mouth daily. Only when taking the lasix 30 tablet 11  . ramipril (ALTACE) 5 MG capsule Take 1 capsule (5 mg total) by mouth 2 (two) times daily. 180 capsule 5  . repaglinide (PRANDIN) 2 MG tablet Take 1 tablet (2 mg total) by mouth 3 (three) times daily before meals. 270 tablet 3   No current facility-administered medications for this visit.    Allergies:   Morphine and related; Actos; Januvia; Percocet; and Hydrocodone-acetaminophen    Social History:  The patient  reports that she has never smoked. She has never used smokeless tobacco. She reports that she does not drink alcohol or use illicit drugs.   Family History:  The patient's family history includes Coronary artery disease in her father; Diabetes in her brother, father, and sister; Hypertension in her father and mother; Kidney failure in her mother and sister; Other in her brother; Stroke in her father.    ROS:  Please see the history of present illness.    Review of Systems: Constitutional:  denies fever, chills, diaphoresis, appetite change and fatigue.  HEENT: denies photophobia, eye pain, redness, hearing loss, ear pain, congestion, sore throat, rhinorrhea, sneezing, neck pain, neck stiffness and tinnitus.  Respiratory: denies SOB, DOE, cough, chest tightness, and wheezing.  Cardiovascular: denies chest pain, palpitations and leg swelling.  Gastrointestinal: denies nausea, vomiting, abdominal pain, diarrhea, constipation, blood in stool.  Genitourinary: denies dysuria, urgency, frequency, hematuria, flank pain and difficulty urinating.  Musculoskeletal: denies  myalgias, back pain, joint swelling, arthralgias and gait problem.   Skin: denies pallor, rash and wound.  Neurological: denies dizziness, seizures, syncope, weakness, light-headedness,  numbness and headaches.   Hematological: denies adenopathy, easy bruising, personal or family bleeding history.  Psychiatric/ Behavioral: denies suicidal ideation, mood changes, confusion, nervousness, sleep disturbance and agitation.       All other systems are reviewed and negative.  PHYSICAL EXAM: VS:  BP 132/68 mmHg  Pulse 62  Ht 5\' 4"  (1.626 m)  Wt 89.721 kg (197 lb 12.8 oz)  BMI 33.94 kg/m2 , BMI Body mass index is 33.94 kg/(m^2). GEN: Well nourished, well developed, in no acute distress HEENT: normal Neck: no JVD, carotid bruits, or masses Cardiac: RRR; no murmurs, rubs, or gallops,no edema  Respiratory:  clear to auscultation bilaterally, normal work of breathing GI: soft, nontender, nondistended, + BS MS: no deformity or atrophy Skin: warm and dry, no rash Neuro:  Strength and sensation are intact Psych: normal   EKG:  EKG is ordered today. The ekg ordered today demonstrates atrial pacing at 60 .    Recent Labs: 03/28/2014: Pro B Natriuretic peptide (BNP) 4959.0* 12/13/2014: TSH 12.11* 01/04/2015: ALT 29 01/09/2015: BUN 12; Creatinine, Ser 1.67*; Hemoglobin 10.5*; Platelets 222; Potassium 3.8; Sodium 139    Lipid Panel    Component Value Date/Time   CHOL 144 12/13/2014 0939   TRIG 93.0 12/13/2014 0939   TRIG 108 04/21/2006 1234   HDL 40.70 12/13/2014 0939   CHOLHDL 4 12/13/2014 0939   CHOLHDL 2.8 CALC 04/21/2006 1234   VLDL 18.6 12/13/2014 0939   LDLCALC 85 12/13/2014 0939      Wt Readings from Last 3 Encounters:  01/12/15 89.721 kg (197 lb 12.8 oz)  01/11/15 89.359 kg (197 lb)  01/09/15 91 kg (200 lb 9.9 oz)      Other studies Reviewed: Additional studies/ records that were reviewed today include: . Review of the above records demonstrates:    ASSESSMENT AND PLAN:  1. Atrial fibrillation / atrial Flutter - paroxysmal - she is maintainin NSR , on  amiodarone to 200 a day  she is currently off Coumadin. She had a major GI bleed despite the  fact that she had a subtherapeutic INR.  At this point I think it's too dangerous to start her on any anticoagulation. She is maintained normal sinus rhythm. We will continue to follow along. We will need some opinion from the gastroenterologist before we reinitiate Coumadin. I have suggested that she call Dr. Watt Climes for further assessment ( if needed )   her last TSH was 12. We have scheduled her to have this rechecked in approximately 2 months. I suspect that the elevated TSH was due to the high dose of amiodarone. We decreased the amiodarone a month ago and anticipate that the TSH will be  Lower at next check. I'll see her in approxi-4 months for follow-up visit.   2. Diabetes mellitus 3. Hypertension - BP is well controlled.   4. Hyperlipidemia - will check lipids at next visit.   5. Chronic systolic CHF - EF 42-35% (assumed to be nonischemic) - her EF has improved to 55% by echo in Dec. 2015.   6. S/p pacer :   She is doing well , pacer is working ok   Current medicines are reviewed at length with the patient today.  The patient does not have concerns regarding medicines.  The following changes have been made:  no change   Disposition:   FU with me in 4 months     Signed, Fareed Fung, Wonda Cheng, MD  01/12/2015 2:39 PM    [address], [place], [place]  [address] Phone: [phone]; Fax: [phone]

## 2015-01-13 ENCOUNTER — Encounter: Payer: Self-pay | Admitting: Cardiology

## 2015-01-16 ENCOUNTER — Ambulatory Visit (INDEPENDENT_AMBULATORY_CARE_PROVIDER_SITE_OTHER): Payer: Medicare Other | Admitting: Internal Medicine

## 2015-01-16 ENCOUNTER — Encounter: Payer: Self-pay | Admitting: Internal Medicine

## 2015-01-16 ENCOUNTER — Other Ambulatory Visit (INDEPENDENT_AMBULATORY_CARE_PROVIDER_SITE_OTHER): Payer: Medicare Other

## 2015-01-16 VITALS — BP 130/72 | HR 82 | Temp 98.2°F | Resp 16 | Wt 199.0 lb

## 2015-01-16 DIAGNOSIS — N183 Chronic kidney disease, stage 3 unspecified: Secondary | ICD-10-CM

## 2015-01-16 DIAGNOSIS — I480 Paroxysmal atrial fibrillation: Secondary | ICD-10-CM | POA: Diagnosis not present

## 2015-01-16 DIAGNOSIS — K625 Hemorrhage of anus and rectum: Secondary | ICD-10-CM | POA: Diagnosis not present

## 2015-01-16 DIAGNOSIS — N179 Acute kidney failure, unspecified: Secondary | ICD-10-CM | POA: Diagnosis not present

## 2015-01-16 DIAGNOSIS — R946 Abnormal results of thyroid function studies: Secondary | ICD-10-CM

## 2015-01-16 DIAGNOSIS — Z23 Encounter for immunization: Secondary | ICD-10-CM

## 2015-01-16 DIAGNOSIS — R7989 Other specified abnormal findings of blood chemistry: Secondary | ICD-10-CM

## 2015-01-16 DIAGNOSIS — R5383 Other fatigue: Secondary | ICD-10-CM

## 2015-01-16 LAB — BASIC METABOLIC PANEL
BUN: 22 mg/dL (ref 6–23)
CALCIUM: 9.4 mg/dL (ref 8.4–10.5)
CO2: 27 meq/L (ref 19–32)
CREATININE: 1.63 mg/dL — AB (ref 0.40–1.20)
Chloride: 105 mEq/L (ref 96–112)
GFR: 32.69 mL/min — AB (ref >60.00)
GLUCOSE: 69 mg/dL — AB (ref 70–99)
Potassium: 4.5 mEq/L (ref 3.5–5.1)
SODIUM: 141 meq/L (ref 135–145)

## 2015-01-16 LAB — CBC WITH DIFFERENTIAL/PLATELET
BASOS ABS: 0 10*3/uL (ref 0.0–0.1)
Basophils Relative: 0.2 % (ref 0.0–3.0)
EOS ABS: 0.3 10*3/uL (ref 0.0–0.7)
Eosinophils Relative: 2.8 % (ref 0.0–5.0)
HEMATOCRIT: 33.9 % — AB (ref 36.0–46.0)
Hemoglobin: 11 g/dL — ABNORMAL LOW (ref 12.0–15.0)
LYMPHS PCT: 8.9 % — AB (ref 12.0–46.0)
Lymphs Abs: 0.9 10*3/uL (ref 0.7–4.0)
MCHC: 32.5 g/dL (ref 30.0–36.0)
MCV: 99.7 fl (ref 78.0–100.0)
MONOS PCT: 6.4 % (ref 3.0–12.0)
Monocytes Absolute: 0.7 10*3/uL (ref 0.1–1.0)
NEUTROS PCT: 81.7 % — AB (ref 43.0–77.0)
Neutro Abs: 8.3 10*3/uL — ABNORMAL HIGH (ref 1.4–7.7)
Platelets: 326 10*3/uL (ref 150.0–400.0)
RBC: 3.4 Mil/uL — AB (ref 3.87–5.11)
RDW: 14.2 % (ref 11.5–15.5)
WBC: 10.2 10*3/uL (ref 4.0–10.5)

## 2015-01-16 LAB — TSH: TSH: 10.12 u[IU]/mL — ABNORMAL HIGH (ref 0.35–4.50)

## 2015-01-16 NOTE — Progress Notes (Signed)
   Subjective:    Patient ID: Becky Forbes, female    DOB: May 13, 1940, 74 y.o.   MRN: [id number]  HPI She was hospitalized 9/21-9/26/16 with bright red rectal bleeding per rectum. Her Coumadin prescribed for paroxysmal fibrillation has been held. Even while in hospital she had intermittent bleeding. Her hemoglobin and hematocrit was 12.8 and 37.8 on 9/21; it dropped to 10.5 and 31.9 on 9/26. She also had leukocytosis with white count 16,400 on 9/21 but this dropped to 6000. Creatinine peaked at 2.53; on 9/26 it was 1.67.  At this time the only bleeding issue is easy bruising. She describes profound fatigue  On 12/13/14 her TSH was 12.11; she is on amiodarone.  Review of Systems Epistaxis, hemoptysis, hematuria, melena, or rectal bleeding denied. No unexplained weight loss, significant dyspepsia,dysphagia, or abdominal pain.  There is no abnormal bleeding or difficulty stopping bleeding with injury.     Objective:   Physical Exam Pertinent or positive findings include: She has complete dentures. She has minor rales at the bases with deep inspiration. She has trace edema @ right ankle. She has slight crepitus in the knees. There is a slight varus change in the knees.  General appearance :adequately nourished; in no distress.  Eyes: No conjunctival inflammation or scleral icterus is present.  Oral exam:  Lips and gums are healthy appearing.There is no oropharyngeal erythema or exudate noted.  Heart:  Normal rate and regular rhythm. S1 and S2 normal without gallop, murmur, click, rub or other extra sounds    Lungs:.No increased work of breathing.   Abdomen: bowel sounds normal, soft and non-tender without masses, organomegaly or hernias noted.  No guarding or rebound.   Vascular : all pulses equal ; no bruits present.  Skin:Warm & dry.  Intact without suspicious lesions or rashes ; no tenting or jaundice   Lymphatic: No lymphadenopathy is noted about the head, neck, axilla.   Neuro:  Strength, tone & DTRs normal.     Assessment & Plan:  #1 right red rectal bleeding per rectum, resolved  #2 acute renal failure superimposed on stage III chronic kidney disease  #3 paroxysmal atrial fibrillation  #4 fatigue  #5 elevated TSH on amiodarone  See orders

## 2015-01-16 NOTE — Progress Notes (Signed)
Pre visit review using our clinic review tool, if applicable. No additional management support is needed unless otherwise documented below in the visit note. 

## 2015-01-16 NOTE — Patient Instructions (Signed)
  Your next office appointment will be determined based upon review of your pending labs  and  xrays  Those written interpretation of the lab results and instructions will be transmitted to you by mail for your records.  Critical results will be called.   Followup as needed for any active or acute issue. Please report any significant change in your symptoms. 

## 2015-01-17 ENCOUNTER — Other Ambulatory Visit: Payer: Self-pay | Admitting: Internal Medicine

## 2015-01-17 DIAGNOSIS — R946 Abnormal results of thyroid function studies: Secondary | ICD-10-CM

## 2015-01-18 ENCOUNTER — Encounter: Payer: Self-pay | Admitting: Internal Medicine

## 2015-01-23 DIAGNOSIS — E113311 Type 2 diabetes mellitus with moderate nonproliferative diabetic retinopathy with macular edema, right eye: Secondary | ICD-10-CM | POA: Diagnosis not present

## 2015-01-25 ENCOUNTER — Encounter: Payer: Self-pay | Admitting: Internal Medicine

## 2015-01-26 ENCOUNTER — Other Ambulatory Visit: Payer: Self-pay | Admitting: *Deleted

## 2015-01-26 MED ORDER — GLUCOSE BLOOD VI STRP: ORAL_STRIP | Status: DC

## 2015-02-08 ENCOUNTER — Other Ambulatory Visit: Payer: Self-pay

## 2015-02-08 MED ORDER — RAMIPRIL 5 MG PO CAPS: 5.0000 mg | ORAL_CAPSULE | Freq: Two times a day (BID) | ORAL | Status: DC

## 2015-02-08 MED ORDER — POTASSIUM CHLORIDE CRYS ER 10 MEQ PO TBCR: 10.0000 meq | EXTENDED_RELEASE_TABLET | Freq: Every day | ORAL | Status: DC

## 2015-02-16 ENCOUNTER — Other Ambulatory Visit: Payer: Self-pay | Admitting: Emergency Medicine

## 2015-02-16 MED ORDER — POTASSIUM CHLORIDE CRYS ER 10 MEQ PO TBCR: 10.0000 meq | EXTENDED_RELEASE_TABLET | Freq: Every day | ORAL | Status: DC

## 2015-02-16 MED ORDER — RAMIPRIL 5 MG PO CAPS: 5.0000 mg | ORAL_CAPSULE | Freq: Two times a day (BID) | ORAL | Status: DC

## 2015-03-21 ENCOUNTER — Other Ambulatory Visit (INDEPENDENT_AMBULATORY_CARE_PROVIDER_SITE_OTHER): Payer: Medicare Other | Admitting: *Deleted

## 2015-03-21 DIAGNOSIS — D62 Acute posthemorrhagic anemia: Secondary | ICD-10-CM | POA: Diagnosis not present

## 2015-03-21 DIAGNOSIS — Z79899 Other long term (current) drug therapy: Secondary | ICD-10-CM

## 2015-03-21 DIAGNOSIS — I495 Sick sinus syndrome: Secondary | ICD-10-CM

## 2015-03-21 LAB — TSH: TSH: 17.83 u[IU]/mL — AB (ref 0.350–4.500)

## 2015-03-21 NOTE — Addendum Note (Signed)
Addended by: Eulis Foster on: 03/21/2015 09:40 AM   Modules accepted: Orders

## 2015-03-21 NOTE — Addendum Note (Signed)
Addended by: Eulis Foster on: 03/21/2015 09:10 AM   Modules accepted: Orders

## 2015-03-22 ENCOUNTER — Other Ambulatory Visit: Payer: Self-pay

## 2015-03-22 ENCOUNTER — Other Ambulatory Visit: Payer: Self-pay | Admitting: Endocrinology

## 2015-03-22 ENCOUNTER — Other Ambulatory Visit: Payer: Self-pay | Admitting: Internal Medicine

## 2015-03-22 ENCOUNTER — Telehealth: Payer: Self-pay | Admitting: Emergency Medicine

## 2015-03-22 ENCOUNTER — Other Ambulatory Visit: Payer: Self-pay | Admitting: Cardiovascular Disease

## 2015-03-22 DIAGNOSIS — R7989 Other specified abnormal findings of blood chemistry: Secondary | ICD-10-CM

## 2015-03-22 MED ORDER — ATORVASTATIN CALCIUM 10 MG PO TABS: 10.0000 mg | ORAL_TABLET | Freq: Every day | ORAL | Status: DC

## 2015-03-22 MED ORDER — AMIODARONE HCL 200 MG PO TABS: 200.0000 mg | ORAL_TABLET | Freq: Every day | ORAL | Status: DC

## 2015-03-22 NOTE — Telephone Encounter (Signed)
Spoke with pts husband to let him know the pt could come in and have thyroid labs checked and would need an appt after labs with Dr Linna Darner or Dr Quay Burow.

## 2015-03-22 NOTE — Telephone Encounter (Signed)
Order for labs sent to you Needs F/U with Dr Quay Burow or me post labs

## 2015-03-22 NOTE — Telephone Encounter (Signed)
Pt called in stating that she has an abnormal TSH reading. The doctor that ran the labs instructed her to come into our office to have more extensive labs done for the TSH. Are you okay with the pt coming in for T3 & T4 and coming for an office visit after if needed. Pt was last seen 01/16/2015. Please advise.

## 2015-03-22 NOTE — Telephone Encounter (Signed)
Refill sent for amiodarone and atorvastatin.

## 2015-03-23 ENCOUNTER — Other Ambulatory Visit (INDEPENDENT_AMBULATORY_CARE_PROVIDER_SITE_OTHER): Payer: Medicare Other

## 2015-03-23 DIAGNOSIS — R946 Abnormal results of thyroid function studies: Secondary | ICD-10-CM

## 2015-03-23 DIAGNOSIS — E133311 Other specified diabetes mellitus with moderate nonproliferative diabetic retinopathy with macular edema, right eye: Secondary | ICD-10-CM | POA: Diagnosis not present

## 2015-03-23 DIAGNOSIS — R7989 Other specified abnormal findings of blood chemistry: Secondary | ICD-10-CM

## 2015-03-23 DIAGNOSIS — H353122 Nonexudative age-related macular degeneration, left eye, intermediate dry stage: Secondary | ICD-10-CM | POA: Diagnosis not present

## 2015-03-23 DIAGNOSIS — E113392 Type 2 diabetes mellitus with moderate nonproliferative diabetic retinopathy without macular edema, left eye: Secondary | ICD-10-CM | POA: Diagnosis not present

## 2015-03-23 DIAGNOSIS — H353211 Exudative age-related macular degeneration, right eye, with active choroidal neovascularization: Secondary | ICD-10-CM | POA: Diagnosis not present

## 2015-03-23 DIAGNOSIS — Z961 Presence of intraocular lens: Secondary | ICD-10-CM | POA: Diagnosis not present

## 2015-03-23 LAB — TSH: TSH: 22.86 u[IU]/mL — ABNORMAL HIGH (ref 0.35–4.50)

## 2015-03-23 LAB — T3, FREE: T3 FREE: 3.1 pg/mL (ref 2.3–4.2)

## 2015-03-23 LAB — T4, FREE: Free T4: 0.59 ng/dL — ABNORMAL LOW (ref 0.60–1.60)

## 2015-03-23 LAB — HM DIABETES EYE EXAM

## 2015-03-27 ENCOUNTER — Ambulatory Visit (INDEPENDENT_AMBULATORY_CARE_PROVIDER_SITE_OTHER): Payer: Medicare Other | Admitting: Internal Medicine

## 2015-03-27 ENCOUNTER — Encounter: Payer: Self-pay | Admitting: Internal Medicine

## 2015-03-27 VITALS — BP 140/90 | HR 62 | Temp 97.7°F | Resp 20 | Ht 64.0 in | Wt 194.2 lb

## 2015-03-27 DIAGNOSIS — E032 Hypothyroidism due to medicaments and other exogenous substances: Secondary | ICD-10-CM | POA: Insufficient documentation

## 2015-03-27 DIAGNOSIS — E038 Other specified hypothyroidism: Secondary | ICD-10-CM | POA: Diagnosis not present

## 2015-03-27 DIAGNOSIS — T462X1A Poisoning by other antidysrhythmic drugs, accidental (unintentional), initial encounter: Principal | ICD-10-CM

## 2015-03-27 DIAGNOSIS — S0993XA Unspecified injury of face, initial encounter: Secondary | ICD-10-CM | POA: Diagnosis not present

## 2015-03-27 MED ORDER — LEVOTHYROXINE SODIUM 25 MCG PO TABS: 25.0000 ug | ORAL_TABLET | Freq: Every day | ORAL | Status: DC

## 2015-03-27 NOTE — Progress Notes (Signed)
   Subjective:    Patient ID: Becky Forbes, female    DOB: 13-Jun-1940, 74 y.o.   MRN: [id number]  HPI   She is here to follow-up her abnormal TSH. On amiodarone 200 mg daily her TSH has risen from 10.12 up to 22.86 in the last 2 months.  An acute issue is a mechanical fall sustained 03/23/15. She tripped on an uneven surface falling face forward striking her chin and thighs. There was no loss of consciousness. There was no cardioneuro prodrome prior to the fall. She was not seen following the event. Her concern is that her pacemaker may have been affected.  Review of Systems  Denied were any change in heart rhythm or rate prior to the event. There was no associated chest pain or shortness of breath .  Also specifically denied prior to the episode were headache, limb weakness, tingling, or numbness. No seizure activity noted.     Objective:   Physical Exam  Pertinent or positive findings include: Romberg is slightly unsteady but she did not fall. Finger-nose testing is normal. Gait is slightly broad and minimally unstable. She felt uncomfortable attempting heel or toe walking due to "leg weakness". With opposition there was no obvious weakness in the lower extremities. There was minimal weakness in the fifth right digit. Cranial nerve exam revealed no deficits.  She has small bruise over the left chin with associated tenderness. She had larger bruises over the anterior thighs. She was slightly tender to palpation over the left chest.  She had minor DIP changes of the fingers.  General appearance :adequately nourished; in no distress.  Eyes: No conjunctival inflammation or scleral icterus is present.  Oral exam:  Lips and gums are healthy appearing.There is no oropharyngeal erythema or exudate noted. Complete dentures. Heart:  Normal rate and regular rhythm. S1 and S2 normal without gallop, murmur, click, rub or other extra sounds    Lungs:Chest clear to auscultation; no wheezes,  rhonchi,rales ,or rubs present.No increased work of breathing.   Abdomen: bowel sounds normal, soft and non-tender without masses, organomegaly or hernias noted.  No No flank tenderness to percussion.  Vascular : all pulses equal ; no bruits present.  Skin:Warm & dry.  Intact without suspicious lesions or rashes ; no tenting or jaundice   Lymphatic: No lymphadenopathy is noted about the head, neck, axilla.   Neuro: Strength, tone & DTRs normal.      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 Residual soft tissue soreness. Pain medications declined.

## 2015-03-27 NOTE — Progress Notes (Signed)
Pre visit review using our clinic review tool, if applicable. No additional management support is needed unless otherwise documented below in the visit note. 

## 2015-03-27 NOTE — Patient Instructions (Addendum)
Repeat TSH in 8-9 weeks and then see Dr Quay Burow 2-3 days later.  Use warm moist compresses 3- 4 times a day to the bruises.

## 2015-03-28 ENCOUNTER — Encounter: Payer: Self-pay | Admitting: Internal Medicine

## 2015-04-04 ENCOUNTER — Telehealth: Payer: Self-pay | Admitting: Emergency Medicine

## 2015-04-04 DIAGNOSIS — R079 Chest pain, unspecified: Secondary | ICD-10-CM

## 2015-04-04 NOTE — Telephone Encounter (Signed)
Rib and cxr ordered.  Does she need something stronger for pain?

## 2015-04-04 NOTE — Telephone Encounter (Signed)
Pt called in stating that her ribs pain was getting much worse. Pt saw Dr Linus Orn on 12/12 for this reason. Please advise

## 2015-04-05 NOTE — Telephone Encounter (Signed)
LVM informing pt that there is an order in for cxr.

## 2015-04-06 ENCOUNTER — Ambulatory Visit (INDEPENDENT_AMBULATORY_CARE_PROVIDER_SITE_OTHER)
Admission: RE | Admit: 2015-04-06 | Discharge: 2015-04-06 | Disposition: A | Discharge status: Home / Self Care | Payer: Medicare Other | Source: Ambulatory Visit | Attending: Internal Medicine | Admitting: Internal Medicine

## 2015-04-06 ENCOUNTER — Encounter: Payer: Self-pay | Admitting: Internal Medicine

## 2015-04-06 DIAGNOSIS — R079 Chest pain, unspecified: Secondary | ICD-10-CM

## 2015-04-06 DIAGNOSIS — S2232XA Fracture of one rib, left side, initial encounter for closed fracture: Secondary | ICD-10-CM | POA: Diagnosis not present

## 2015-04-07 ENCOUNTER — Encounter: Payer: Self-pay | Admitting: Internal Medicine

## 2015-04-07 DIAGNOSIS — S2232XA Fracture of one rib, left side, initial encounter for closed fracture: Secondary | ICD-10-CM | POA: Insufficient documentation

## 2015-04-11 ENCOUNTER — Ambulatory Visit (INDEPENDENT_AMBULATORY_CARE_PROVIDER_SITE_OTHER): Payer: Medicare Other | Admitting: *Deleted

## 2015-04-11 DIAGNOSIS — I495 Sick sinus syndrome: Secondary | ICD-10-CM | POA: Diagnosis not present

## 2015-04-11 NOTE — Progress Notes (Signed)
Remote pacemaker transmission.   

## 2015-04-13 ENCOUNTER — Encounter: Payer: Self-pay | Admitting: *Deleted

## 2015-04-13 LAB — CUP PACEART REMOTE DEVICE CHECK
Battery Remaining Longevity: 109 mo
Battery Remaining Percentage: 95.5 %
Brady Statistic AS VS Percent: 1 %
Brady Statistic RV Percent Paced: 25 %
Date Time Interrogation Session: 20161227070011
Implantable Lead Implant Date: 20151215
Implantable Lead Location: 753859
Lead Channel Sensing Intrinsic Amplitude: 12 mV
Lead Channel Setting Pacing Amplitude: 2 V
Lead Channel Setting Pacing Amplitude: 2.5 V
Lead Channel Setting Pacing Pulse Width: 0.4 ms
Lead Channel Setting Sensing Sensitivity: 2 mV
MDC IDC LEAD IMPLANT DT: 20151215
MDC IDC LEAD LOCATION: 753860
MDC IDC LEAD MODEL: 1948
MDC IDC MSMT BATTERY VOLTAGE: 2.99 V
MDC IDC MSMT LEADCHNL RA IMPEDANCE VALUE: 390 Ohm
MDC IDC MSMT LEADCHNL RV IMPEDANCE VALUE: 710 Ohm
MDC IDC STAT BRADY AP VP PERCENT: 25 %
MDC IDC STAT BRADY AP VS PERCENT: 75 %
MDC IDC STAT BRADY AS VP PERCENT: 1 %
MDC IDC STAT BRADY RA PERCENT PACED: 99 %
Pulse Gen Model: 2240
Pulse Gen Serial Number: 7694159

## 2015-04-14 ENCOUNTER — Encounter: Payer: Self-pay | Admitting: Cardiology

## 2015-04-18 DIAGNOSIS — E113311 Type 2 diabetes mellitus with moderate nonproliferative diabetic retinopathy with macular edema, right eye: Secondary | ICD-10-CM | POA: Diagnosis not present

## 2015-04-18 DIAGNOSIS — H43812 Vitreous degeneration, left eye: Secondary | ICD-10-CM | POA: Diagnosis not present

## 2015-04-18 DIAGNOSIS — E113392 Type 2 diabetes mellitus with moderate nonproliferative diabetic retinopathy without macular edema, left eye: Secondary | ICD-10-CM | POA: Diagnosis not present

## 2015-04-19 ENCOUNTER — Other Ambulatory Visit: Payer: Self-pay | Admitting: Cardiovascular Disease

## 2015-05-12 ENCOUNTER — Ambulatory Visit (INDEPENDENT_AMBULATORY_CARE_PROVIDER_SITE_OTHER): Payer: Medicare Other | Admitting: Cardiovascular Disease

## 2015-05-12 ENCOUNTER — Encounter: Payer: Self-pay | Admitting: Cardiovascular Disease

## 2015-05-12 VITALS — BP 150/80 | HR 60 | Ht 64.0 in | Wt 183.0 lb

## 2015-05-12 DIAGNOSIS — I480 Paroxysmal atrial fibrillation: Secondary | ICD-10-CM

## 2015-05-12 DIAGNOSIS — I779 Disorder of arteries and arterioles, unspecified: Secondary | ICD-10-CM

## 2015-05-12 DIAGNOSIS — I5042 Chronic combined systolic (congestive) and diastolic (congestive) heart failure: Secondary | ICD-10-CM | POA: Diagnosis not present

## 2015-05-12 DIAGNOSIS — E785 Hyperlipidemia, unspecified: Secondary | ICD-10-CM

## 2015-05-12 DIAGNOSIS — I739 Peripheral vascular disease, unspecified: Secondary | ICD-10-CM

## 2015-05-12 MED ORDER — ATORVASTATIN CALCIUM 20 MG PO TABS: 20.0000 mg | ORAL_TABLET | Freq: Every day | ORAL | Status: DC

## 2015-05-12 NOTE — Progress Notes (Signed)
Cardiology Office Note   Date:  05/12/2015   ID:  Becky Forbes, DOB 06/29/1940, MRN [id number]  PCP:  Binnie Rail, MD  Cardiologist:   Thayer Headings, MD   Chief Complaint  Patient presents with  . Follow-up    CHF, atrial fib   1. Atrial fibrillation / atrial Flutter - paroxysmal 2. Diabetes mellitus 3. Hypertension 4. Hyperlipidemia 5. Chronic systolic CHF - EF 123456 (assumed to be nonischemic) 6. S/p pacer :   7. Carotid artery disease - mod - severe Left internal carotid stenosis by Duplex in 2010   History of Present Illness:  [name] not had any episodes of chest pain or shortness of breath. She had back surgery earlier this year. [name] not had any chest pain or shortness breath. Her blood pressure has been well controlled. [name] not had any episodes of atrial fibrillation.  Feb. 4, 2014: She was admitted to the hospital in mid January with a GI bleed and was noted to be in atrial fibrillation. She had an echocardiogram which revealed an ejection fraction of 35-40%. She started on Lasix 20 twice a day and metoprolol. She had some blood work drawn last week which revealed a mild increase in her creatinine up to 1.9. She was also found to have a potassium of 6.1. Recheck potassium level was 4.6. [name] noted that her blood pressures been quite low since she left the hospital. She also was having symptoms of orthostatic hypotension.  July 23, 2012:  Becky Forbes is doing well. No further episodes of Afib and no blood in stool. She was admitted to the hospital in January with GI bleeding was found to have atrial fibrillation. Workup at that time revealed what appeared to be ischemic colitis. Her aspirin was held for a week. She improved and has not had any further bleeding. We have been able to restart Coumadin she's not had any bleeding. The bruising that she was having is now better since she has been off the aspirin.  She has a nonischemic cardiomyopathy. Echocardiogram in  January revealed an ejection fraction of 35-40%. She has moderate pulmonary hypertension with an estimated PA pressure of 49. I have reviewed the echo and the results are noted below. Left ventricle: The cavity size was normal. Wall thickness was increased in a pattern of mild LVH. Systolic function was moderately reduced. The estimated ejection fraction was in the range of 35% to 40%. Diffuse hypokinesis. - Mitral valve: Mild regurgitation. - Left atrium: The atrium was mildly dilated. - Right ventricle: The cavity size was mildly dilated. Systolic function was mildly reduced. - Right atrium: The atrium was mildly dilated. - Pulmonary arteries: Systolic pressure was moderately increased. PA peak pressure: 69mm Hg   She continues to have generalized fatigue and malaise.  She has chronic renal insufficiency.  Oct. 23, 2014:  Becky Forbes is in atrial flutter today. No symptoms. breathing is ok. She walks on occasion. [name] not able to tell if her heart rate is irregular or not.  August 03, 2013:   Becky Forbes is doing well. No Cp or dyspnea. Perhaps mild DOE with walking  Echo April 2015 showed improved LV function.  Left ventricle: The cavity size was normal. Systolic function was mildly to moderately reduced. The estimated ejection fraction was in the range of 40% to 45%. Severe hypokinesis of the basal-midinferolateral and inferior myocardium. The study is not technically sufficient to allow evaluation of LV diastolic function. - Mitral valve: Calcified annulus. Mild to moderate  regurgitation directed centrally. - Left atrium: The atrium was severely dilated. - Right atrium: The atrium was moderately dilated  September 22, 2013: Becky Forbes is feeling a little bit better. She seems to be tolerating the amiodarone better than the high dose of metoprolol. [name] not having episodes of chest pain. She thinks her heart rate is better.  Her blood pressure here in the office is a little bit elevated.  Her blood pressures at home have been all in the normal range.  Sept. 10, 2015:  Becky Forbes is feeling quite well. She has not noticed any heart rate irregularities. She has felt a lot better since decreasing the dose of amiodarone to 200 mg a day.   Feb. 25, 2016:   Becky Forbes is a 75 y.o. female who presents for follow-up of her atrial fibrillation. Since I last saw her she's had a pacemaker placed.  She is feeling well.  Has some tenderness and " lumpyness around the pacer for the past week.    still has some dizziness.   12/13/2014: Still has lack of energy  Fatigues with walking .    06/13/2014:  Becky Forbes presents today following a hospitalization for GI bleed. She was admitted on September 21 with an acute GI bleed. This was despite the fact that she had been off her Coumadin for the previous 7 days and her INR was only 1.8. She was stabilized. She is seen back today.  [name] feeling better and has not had any further bleeding.  She has maintained NSR.    Jan. 27, 2017:  Doing ok from a cardiac standpoint. She fell over the Christmas holidays - tripped over some  broken pavement .  BP has been normal at home.   Slightly elevated here - she has not had her meds this am . She had moderate carotid artery disease by duplex scan in 2010. [name] due for a repeat duplex scan. [name] not having any neurologic symptoms. She denies any chest pain or shortness of breath. She does not get much exercise. She is limited by back pain   Past Medical History  Diagnosis Date  . HTN (hypertension)   . Hyperlipemia   . Diabetes mellitus     x 10 yrs  . PAF (paroxysmal atrial fibrillation) ([place])   . Symptomatic sinus bradycardia     a. [place] DR  dual-chamber pacemaker 03/2014 by Dr. Rayann Heman.  . Chronic combined systolic and diastolic CHF (congestive heart failure) ([place])   . Typical atrial flutter ([place])   . Sick sinus syndrome ([place])   . CKD (chronic kidney disease), stage III   .  GI bleed     a. 04/2012 - lower GIB - felt 2/2 mild ischemic colitis as shown on colonoscopy, was cleared for anticoag 1 week out from procedure.  . Ischemic colitis ([place])     a. 04/2012 - lower GIB - felt 2/2 mild ischemic colitis as shown on colonoscopy..  . Anemia   . Carotid artery disease ([place])     a. Carotid duplex in 2010: [id number] RICA, [id number] LICA.  [place] Kitchen Renal insufficiency   . Complication of anesthesia   . PONV (postoperative nausea and vomiting)   . Presence of permanent cardiac pacemaker 03/2014    Past Surgical History  Procedure Laterality Date  . Cataract extraction  05/25/07    RIGHT EYE  . Appendectomy    . Carpal tunnel release    . Abdominal hysterectomy    . Lumbar  laminectomy    . Foot surgery  2006    MID FOOT RECONSTRUCTION  . Osteotomy  2006    AND FUSION  . Ganglion cyst excision  12/2007    Dr.Gramig  . Total knee arthroplasty  05/02/08, 07/25/08    Dr.Alusio  . Joint replacement      bilateral  . 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 Colonoscopy  01/31/2009    small external/internal hemorrhoids, diverticulosis in sigmoid colon, one small polyp (biopsied). Biospy essentially normal. Dr.Magod  . Colonoscopy  04/30/2012    Procedure: COLONOSCOPY;  Surgeon: Cleotis Nipper, MD;  Location: [facility] ENDOSCOPY;  Service: Endoscopy;  Laterality: N/A;  unprepped, poss flex only  . Permanent pacemaker insertion N/A 03/29/2014    [employer] dual chamber paceamker implnated by Dr Rayann Heman     Current Outpatient Prescriptions  Medication Sig Dispense Refill  . amiodarone (PACERONE) 200 MG tablet Take 1 tablet (200 mg total) by mouth daily. 90 tablet 3  . atorvastatin (LIPITOR) 10 MG tablet Take 1 tablet (10 mg total) by mouth daily. 90 tablet 3  . bromocriptine (PARLODEL) 2.5 MG tablet TAKE ONE TABLET BY MOUTH ONCE DAILY. 180 tablet 0  . Cholecalciferol (VITAMIN D PO) Take 800 Units by mouth daily.      . furosemide (LASIX) 20 MG tablet Take 1 tablet (20  mg total) by mouth daily as needed for fluid or edema. 90 tablet 1  . glucose blood (BAYER CONTOUR NEXT TEST) test strip 1 each by Other route daily. And lancets 1/day 100 each 12  . levothyroxine (SYNTHROID, LEVOTHROID) 25 MCG tablet Take 1 tablet (25 mcg total) by mouth daily before breakfast. 30 tablet 2  . metoprolol (LOPRESSOR) 50 MG tablet Take 1 tablet by mouth two  times daily 180 tablet 0  . Multiple Vitamin (MULITIVITAMIN WITH MINERALS) TABS Take 1 tablet by mouth daily.    . potassium chloride (K-DUR,KLOR-CON) 10 MEQ tablet Take 10 mEq by mouth daily as needed (Potassium). Only when taking Lasix    . ramipril (ALTACE) 5 MG capsule Take 1 capsule (5 mg total) by mouth 2 (two) times daily. 180 capsule 3  . repaglinide (PRANDIN) 2 MG tablet Take 1 tablet (2 mg total) by mouth 3 (three) times daily before meals. 270 tablet 3   No current facility-administered medications for this visit.    Allergies:   Morphine and related; Actos; Januvia; Percocet; and Hydrocodone-acetaminophen    Social History:  The patient  reports that she has never smoked. She has never used smokeless tobacco. She reports that she does not drink alcohol or use illicit drugs.   Family History:  The patient's family history includes Coronary artery disease in her father; Diabetes in her brother, father, and sister; Hypertension in her father and mother; Kidney failure in her mother and sister; Other in her brother; Stroke in her father.    ROS:  Please see the history of present illness.    Review of Systems: Constitutional:  denies fever, chills, diaphoresis, appetite change and fatigue.  HEENT: denies photophobia, eye pain, redness, hearing loss, ear pain, congestion, sore throat, rhinorrhea, sneezing, neck pain, neck stiffness and tinnitus.  Respiratory: denies SOB, DOE, cough, chest tightness, and wheezing.  Cardiovascular: denies chest pain, palpitations and leg swelling.  Gastrointestinal: denies nausea,  vomiting, abdominal pain, diarrhea, constipation, blood in stool.  Genitourinary: denies dysuria, urgency, frequency, hematuria, flank pain and difficulty urinating.  Musculoskeletal: denies  myalgias, back  pain, joint swelling, arthralgias and gait problem.   Skin: denies pallor, rash and wound.  Neurological: denies dizziness, seizures, syncope, weakness, light-headedness, numbness and headaches.   Hematological: denies adenopathy, easy bruising, personal or family bleeding history.  Psychiatric/ Behavioral: denies suicidal ideation, mood changes, confusion, nervousness, sleep disturbance and agitation.   All other systems are reviewed and negative.   PHYSICAL EXAM: VS:  BP 150/80 mmHg  Pulse 60  Ht 5\' 4"  (1.626 m)  Wt 183 lb (83.008 kg)  BMI 31.40 kg/m2  SpO2 98% , BMI Body mass index is 31.4 kg/(m^2). GEN: Well nourished, well developed, in no acute distress HEENT: normal Neck: no JVD, carotid bruits, or masses Cardiac: RRR; no murmurs, rubs, or gallops,no edema  Respiratory:  clear to auscultation bilaterally, normal work of breathing GI: soft, nontender, nondistended, + BS MS: no deformity or atrophy Skin: warm and dry, no rash Neuro:  Strength and sensation are intact Psych: normal  EKG:  EKG is ordered today. The ekg ordered today demonstrates atrial pacing at 60 .    Recent Labs: 01/04/2015: ALT 29 01/16/2015: BUN 22; Creatinine, Ser 1.63*; Hemoglobin 11.0*; Platelets 326.0; Potassium 4.5; Sodium 141 03/23/2015: TSH 22.86*   Lipid Panel    Component Value Date/Time   CHOL 144 12/13/2014 0939   TRIG 93.0 12/13/2014 0939   TRIG 108 04/21/2006 1234   HDL 40.70 12/13/2014 0939   CHOLHDL 4 12/13/2014 0939   CHOLHDL 2.8 CALC 04/21/2006 1234   VLDL 18.6 12/13/2014 0939   LDLCALC 85 12/13/2014 0939      Wt Readings from Last 3 Encounters:  05/12/15 183 lb (83.008 kg)  03/27/15 194 lb 4 oz (88.111 kg)  01/16/15 199 lb (90.266 kg)      Other studies  Reviewed: Additional studies/ records that were reviewed today include: . Review of the above records demonstrates:    ASSESSMENT AND PLAN:  1. Atrial fibrillation / atrial Flutter - paroxysmal - she is maintainin NSR , on  amiodarone to 200 a day  she is currently off Coumadin. She had a major GI bleed despite the fact that she had a subtherapeutic INR. We have not had her on chronic anticoagulation because of a major GI bleed. She is maintained normal sinus rhythm. We will continue to follow along. We will need some opinion from the gastroenterologist before we reinitiate Coumadin. I have suggested that she call Dr. Watt Climes for further assessment ( if needed )  She is in NSR today by eam   2. Diabetes mellitus 3. Hypertension - BP is well controlled.   4. Hyperlipidemia -  her LDL was mildly elevated at her last lab check in August. We'll increase her atorvastatin 20 mg and will check fasting lipids, liver enzymes, and basic medical profile in 3 months.  5. Chronic systolic CHF - EF 123456 (assumed to be nonischemic) - her EF has improved to 55% by echo in Dec. 2015.   6. S/p pacer :   She is doing well , pacer is working ok  7 carotid artery disease: She has known history of carotid artery disease with the left greater than right. Her last duplex scan was in 2010. We will repeat carotid duplex scan.  Current medicines are reviewed at length with the patient today.  The patient does not have concerns regarding medicines.  The following changes have been made:  no change   Disposition:   FU with me in 6 months     Signed, Nahser, Arnette Norris  J, MD  05/12/2015 8:10 AM    [address], [place], [place]  [address] Phone: [phone]; Fax: [phone]

## 2015-05-12 NOTE — Patient Instructions (Addendum)
Medication Instructions:  INCREASE Lipitor to 20 mg once daily   Labwork: Your physician recommends that you return for lab work in: 3 months - cholesterol, CMET You will need to fast for your appointment - only drink water after midnight   Testing/Procedures: Your physician has requested that you have a carotid duplex. This test is an ultrasound of the carotid arteries in your neck. It looks at blood flow through these arteries that supply the brain with blood. Allow one hour for this exam. There are no restrictions or special instructions.    Follow-Up: Your physician wants you to follow-up in: 6 months with Dr. Acie Fredrickson.  You will receive a reminder letter in the mail two months in advance. If you don't receive a letter, please call our office to schedule the follow-up appointment.   If you need a refill on your cardiac medications before your next appointment, please call your pharmacy.   Thank you for choosing [facility]! Christen Bame, RN [phone]

## 2015-05-18 ENCOUNTER — Ambulatory Visit (HOSPITAL_COMMUNITY)
Admission: RE | Admit: 2015-05-18 | Discharge: 2015-05-18 | Disposition: A | Discharge status: Home / Self Care | Payer: Medicare Other | Source: Ambulatory Visit | Attending: Internal Medicine | Admitting: Internal Medicine

## 2015-05-18 DIAGNOSIS — I6523 Occlusion and stenosis of bilateral carotid arteries: Secondary | ICD-10-CM | POA: Diagnosis not present

## 2015-05-18 DIAGNOSIS — E785 Hyperlipidemia, unspecified: Secondary | ICD-10-CM | POA: Insufficient documentation

## 2015-05-18 DIAGNOSIS — I779 Disorder of arteries and arterioles, unspecified: Secondary | ICD-10-CM | POA: Insufficient documentation

## 2015-05-18 DIAGNOSIS — R0989 Other specified symptoms and signs involving the circulatory and respiratory systems: Secondary | ICD-10-CM | POA: Diagnosis not present

## 2015-05-18 DIAGNOSIS — I739 Peripheral vascular disease, unspecified: Secondary | ICD-10-CM

## 2015-05-25 ENCOUNTER — Telehealth: Payer: Self-pay | Admitting: Cardiovascular Disease

## 2015-05-25 NOTE — Telephone Encounter (Signed)
Left detailed message of results on home answering machine and advised her to call office with questions or concerns

## 2015-05-25 NOTE — Telephone Encounter (Signed)
-----   Message from Thayer Headings, MD sent at 05/18/2015  1:41 PM EST ----- Mild carotid artery disease, left greater than right. Follow-up with duplex scan in one year.

## 2015-05-25 NOTE — Telephone Encounter (Signed)
New message ° ° ° ° °Returning a call to the nurse °

## 2015-05-26 ENCOUNTER — Ambulatory Visit: Payer: Medicare Other | Admitting: Internal Medicine

## 2015-05-30 ENCOUNTER — Other Ambulatory Visit: Payer: Self-pay | Admitting: *Deleted

## 2015-05-30 MED ORDER — ATORVASTATIN CALCIUM 20 MG PO TABS: 20.0000 mg | ORAL_TABLET | Freq: Every day | ORAL | Status: DC

## 2015-06-03 DIAGNOSIS — S60511A Abrasion of right hand, initial encounter: Secondary | ICD-10-CM | POA: Diagnosis not present

## 2015-06-03 DIAGNOSIS — S8001XA Contusion of right knee, initial encounter: Secondary | ICD-10-CM | POA: Diagnosis not present

## 2015-06-03 DIAGNOSIS — S60512A Abrasion of left hand, initial encounter: Secondary | ICD-10-CM | POA: Diagnosis not present

## 2015-06-03 DIAGNOSIS — S62346A Nondisplaced fracture of base of fifth metacarpal bone, right hand, initial encounter for closed fracture: Secondary | ICD-10-CM | POA: Diagnosis not present

## 2015-06-12 ENCOUNTER — Ambulatory Visit (INDEPENDENT_AMBULATORY_CARE_PROVIDER_SITE_OTHER): Payer: Medicare Other | Admitting: Internal Medicine

## 2015-06-12 ENCOUNTER — Encounter: Payer: Self-pay | Admitting: Internal Medicine

## 2015-06-12 ENCOUNTER — Other Ambulatory Visit (INDEPENDENT_AMBULATORY_CARE_PROVIDER_SITE_OTHER): Payer: Medicare Other

## 2015-06-12 VITALS — BP 132/64 | HR 72 | Temp 98.5°F | Resp 18 | Wt 184.0 lb

## 2015-06-12 DIAGNOSIS — M8580 Other specified disorders of bone density and structure, unspecified site: Secondary | ICD-10-CM | POA: Diagnosis not present

## 2015-06-12 DIAGNOSIS — I480 Paroxysmal atrial fibrillation: Secondary | ICD-10-CM

## 2015-06-12 DIAGNOSIS — S62609D Fracture of unspecified phalanx of unspecified finger, subsequent encounter for fracture with routine healing: Secondary | ICD-10-CM

## 2015-06-12 DIAGNOSIS — E1121 Type 2 diabetes mellitus with diabetic nephropathy: Secondary | ICD-10-CM | POA: Diagnosis not present

## 2015-06-12 DIAGNOSIS — E032 Hypothyroidism due to medicaments and other exogenous substances: Secondary | ICD-10-CM

## 2015-06-12 DIAGNOSIS — Z780 Asymptomatic menopausal state: Secondary | ICD-10-CM

## 2015-06-12 DIAGNOSIS — E038 Other specified hypothyroidism: Secondary | ICD-10-CM

## 2015-06-12 DIAGNOSIS — T462X1A Poisoning by other antidysrhythmic drugs, accidental (unintentional), initial encounter: Secondary | ICD-10-CM

## 2015-06-12 DIAGNOSIS — I10 Essential (primary) hypertension: Secondary | ICD-10-CM

## 2015-06-12 LAB — COMPREHENSIVE METABOLIC PANEL
ALT: 41 U/L — AB (ref 0–35)
AST: 56 U/L — ABNORMAL HIGH (ref 0–37)
Albumin: 3.8 g/dL (ref 3.5–5.2)
Alkaline Phosphatase: 111 U/L (ref 39–117)
BILIRUBIN TOTAL: 0.5 mg/dL (ref 0.2–1.2)
BUN: 24 mg/dL — ABNORMAL HIGH (ref 6–23)
CHLORIDE: 99 meq/L (ref 96–112)
CO2: 27 meq/L (ref 19–32)
Calcium: 9.4 mg/dL (ref 8.4–10.5)
Creatinine, Ser: 1.88 mg/dL — ABNORMAL HIGH (ref 0.40–1.20)
GFR: 27.7 mL/min — AB (ref >60.00)
GLUCOSE: 142 mg/dL — AB (ref 70–99)
POTASSIUM: 4.9 meq/L (ref 3.5–5.1)
Sodium: 133 mEq/L — ABNORMAL LOW (ref 135–145)
Total Protein: 7.9 g/dL (ref 6.0–8.3)

## 2015-06-12 LAB — TSH: TSH: 6.43 u[IU]/mL — ABNORMAL HIGH (ref 0.35–4.50)

## 2015-06-12 LAB — T4, FREE: Free T4: 0.93 ng/dL (ref 0.60–1.60)

## 2015-06-12 NOTE — Patient Instructions (Addendum)
  We have reviewed your prior records including labs and tests today.  Test(s) ordered today. Your results will be released to [place] (or called to you) after review, usually within 72hours after test completion. If any changes need to be made, you will be notified at that same time.   Medications reviewed and updated.   No changes recommended at this time.   Please followup annually, sooner if needed

## 2015-06-12 NOTE — Assessment & Plan Note (Signed)
BP Readings from Last 3 Encounters:  06/12/15 132/64  05/12/15 150/80  03/27/15 140/90    BP controlled here today Continue current medication Continue regular exericse

## 2015-06-12 NOTE — Assessment & Plan Note (Signed)
Follows with Dr. Loanne Drilling  Lab Results  Component Value Date   HGBA1C 6.9 01/11/2015   Controlled Management per Dr. Loanne Drilling Increase exercise if possible

## 2015-06-12 NOTE — Progress Notes (Signed)
Pre visit review using our clinic review tool, if applicable. No additional management support is needed unless otherwise documented below in the visit note. 

## 2015-06-12 NOTE — Progress Notes (Signed)
Subjective:    Patient ID: Becky Forbes, female    DOB: December 30, 1940, 75 y.o.   MRN: [id number]  HPI She is here to establish with a new pcp.  She needs to have her thyroid rechecked.  Hypothyroidism:  She is taking her medication daily - she started the medication about three months ago.  She feels increased hunger since starting the medication.  She feels her energy level is better since being on the medication.    Afib:  She is taking all of her medications as prescribed.  She is following with cardiology.  She has occasional palpitations when she has the Afib.  She denies chest pain, headaches and lightheadedness.    Hypertension: She is taking her medication daily. She is compliant with a low sodium diet.  She denies chest pain, palpitations, edema, shortness of breath and regular headaches. She is exercising  - walks a couple of days a week.  She does not monitor her blood pressure at home.    Diabetes: She is taking her medication daily as prescribed. She follows with Dr. Loanne Drilling.  She is compliant with a diabetic diet. She is exercising minimally.    Osteopenia:  She had a dexa scan a few years ago.  She is over due for a dexa.  She had a recent fall and broke her right hand.  She is taking vitamin D.  She is walking, but only a couple of times a week.  Medications and allergies reviewed with patient and updated if appropriate.  Patient Active Problem List   Diagnosis Date Noted  . Fracture of rib of left side 04/07/2015  . 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 Elevated TSH 01/16/2015  . BRBPR (bright red blood per rectum) 01/04/2015  . Paroxysmal a-fib ([place]) 01/04/2015  . Colitis, acute 01/04/2015  . Benign essential HTN 01/04/2015  . Acute renal failure superimposed on stage 3 chronic kidney disease ([place]) 01/04/2015  . Leukocytosis 01/04/2015  . Diabetes mellitus with diabetic nephropathy ([place]) 01/04/2015  . History of colonic polyps 09/26/2014  . PAF (paroxysmal  atrial fibrillation) ([place])   . Chronic combined systolic and diastolic CHF (congestive heart failure) ([place])   . Carotid artery disease ([place])   . Symptomatic sinus bradycardia 03/31/2014  . Acute on chronic combined systolic and diastolic HF (heart failure), NYHA class 2 ([place]) 03/31/2014  . S/P cardiac pacemaker procedure, St. Jude device 03/31/2014  . Sick sinus syndrome ([place])   . Typical atrial flutter ([place])   . Encounter for therapeutic drug monitoring 05/14/2013  . Long term current use of anticoagulant therapy 05/07/2012  . Ischemic colitis ([place]) 05/06/2012  . Nonspecific abnormal results of thyroid function study 05/06/2012  . Diverticulosis 04/28/2012  . HYPERLIPIDEMIA 06/23/2007  . 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 OSTEOPENIA 02/11/2007    Current Outpatient Prescriptions on File Prior to Visit  Medication Sig Dispense Refill  . amiodarone (PACERONE) 200 MG tablet Take 1 tablet (200 mg total) by mouth daily. 90 tablet 3  . atorvastatin (LIPITOR) 20 MG tablet Take 1 tablet (20 mg total) by mouth daily. 90 tablet 3  . bromocriptine (PARLODEL) 2.5 MG tablet TAKE ONE TABLET BY MOUTH ONCE DAILY. 180 tablet 0  . Cholecalciferol (VITAMIN D PO) Take 800 Units by mouth daily.      . furosemide (LASIX) 20 MG tablet Take 1 tablet (20 mg total) by mouth daily as needed for fluid or edema. 90 tablet 1  .  glucose blood (BAYER CONTOUR NEXT TEST) test strip 1 each by Other route daily. And lancets 1/day 100 each 12  . levothyroxine (SYNTHROID, LEVOTHROID) 25 MCG tablet Take 1 tablet (25 mcg total) by mouth daily before breakfast. 30 tablet 2  . metoprolol (LOPRESSOR) 50 MG tablet Take 1 tablet by mouth two  times daily 180 tablet 0  . Multiple Vitamin (MULITIVITAMIN WITH MINERALS) TABS Take 1 tablet by mouth daily.    . potassium chloride (K-DUR,KLOR-CON) 10 MEQ tablet Take 10 mEq by mouth daily as needed (Potassium). Only when taking Lasix    . ramipril (ALTACE) 5 MG  capsule Take 1 capsule (5 mg total) by mouth 2 (two) times daily. 180 capsule 3  . repaglinide (PRANDIN) 2 MG tablet Take 1 tablet (2 mg total) by mouth 3 (three) times daily before meals. 270 tablet 3   No current facility-administered medications on file prior to visit.    Past Medical History  Diagnosis Date  . HTN (hypertension)   . Hyperlipemia   . Diabetes mellitus     x 10 yrs  . PAF (paroxysmal atrial fibrillation) ([place])   . Symptomatic sinus bradycardia     a. [place] DR  dual-chamber pacemaker 03/2014 by Dr. Rayann Heman.  . Chronic combined systolic and diastolic CHF (congestive heart failure) ([place])   . Typical atrial flutter ([place])   . Sick sinus syndrome ([place])   . CKD (chronic kidney disease), stage III   . GI bleed     a. 04/2012 - lower GIB - felt 2/2 mild ischemic colitis as shown on colonoscopy, was cleared for anticoag 1 week out from procedure.  . Ischemic colitis ([place])     a. 04/2012 - lower GIB - felt 2/2 mild ischemic colitis as shown on colonoscopy..  . Anemia   . Carotid artery disease ([place])     a. Carotid duplex in 2010: [id number] RICA, [id number] LICA.  Marland Kitchen Renal insufficiency   . Complication of anesthesia   . PONV (postoperative nausea and vomiting)   . Presence of permanent cardiac pacemaker 03/2014    Past Surgical History  Procedure Laterality Date  . Cataract extraction  05/25/07    RIGHT EYE  . Appendectomy    . Carpal tunnel release    . Abdominal hysterectomy    . Lumbar laminectomy    . Foot surgery  2006    MID FOOT RECONSTRUCTION  . Osteotomy  2006    AND FUSION  . Ganglion cyst excision  12/2007    Dr.Gramig  . Total knee arthroplasty  05/02/08, 07/25/08    Dr.Alusio  . Joint replacement      bilateral  . 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 Colonoscopy  01/31/2009    small external/internal hemorrhoids, diverticulosis in sigmoid colon, one small polyp (biopsied). Biospy essentially normal. Dr.Magod  . Colonoscopy   04/30/2012    Procedure: COLONOSCOPY;  Surgeon: Cleotis Nipper, MD;  Location[facility] END[facility]COPY;  Service: Endoscopy;  Laterality: N/A;  unprepped, poss flex only  . Permanent pacemaker insertion N/A 03/29/2014    STJ Assurity dual chamber paceamker implnated by Dr Rayann Heman    Social History   Social History  . Marital Status: Married    Spouse Name: Jeneen Rinks  . Number of Children: 2  . Years of Education: N/A   Occupational History  . [employer]    RETIRED IN 2007   Social History Main  Topics  . Smoking status: Never Smoker   . Smokeless tobacco: Never Used  . Alcohol Use: No  . Drug Use: No  . Sexual Activity: Not Currently   Other Topics Concern  . Not on file   Social History Narrative    Family History  Problem Relation Age of Onset  . Coronary artery disease Father   . Hypertension Father   . Stroke Father   . Diabetes Father   . Kidney failure Mother   . Hypertension Mother   . Other Brother     SEPSIS  . Diabetes Brother   . Diabetes Sister   . Kidney failure Sister     Review of Systems  Constitutional: Negative for fever and chills.  Respiratory: Negative for cough, shortness of breath and wheezing.   Cardiovascular: Positive for palpitations (occasional, realted to afib) and leg swelling (occasional, lasix as needed (QOD)). Negative for chest pain.  Gastrointestinal:       No GERD  Endocrine: Positive for cold intolerance.  Neurological: Negative for dizziness, light-headedness and headaches.       Objective:   Filed Vitals:   06/12/15 1616  BP: 132/64  Pulse: 72  Temp: 98.5 F (36.9 C)  Resp: 18   Filed Weights   06/12/15 1616  Weight: 184 lb (83.462 kg)   Body mass index is 31.57 kg/(m^2).   Physical Exam Constitutional: Appears well-developed and well-nourished. No distress.  Neck: Neck supple. No tracheal deviation present. No thyromegaly present.  No carotid bruit. No cervical adenopathy.   Cardiovascular:  Normal rate, regular rhythm and normal heart sounds.   No murmur heard.  No edema Pulmonary/Chest: Effort normal and breath sounds normal. No respiratory distress. No wheezes.       Assessment & Plan:   See Problem List for Assessment and Plan of chronic medical problems.  Follow up annually, sooner if needed

## 2015-06-12 NOTE — Assessment & Plan Note (Signed)
Check tsh  Titrate med dose if needed  

## 2015-06-12 NOTE — Assessment & Plan Note (Signed)
Symptomatic - Occasional palpitations Following with cardiology Taking amiodarone Rate controlled

## 2015-06-12 NOTE — Assessment & Plan Note (Signed)
H/o osteopenia Recent fall with fracture of hand dexa ordered to r/o osteoporosis Taking vitamin D Increase exercise if able

## 2015-06-13 ENCOUNTER — Encounter: Payer: Self-pay | Admitting: Endocrinology

## 2015-06-13 ENCOUNTER — Ambulatory Visit (INDEPENDENT_AMBULATORY_CARE_PROVIDER_SITE_OTHER): Payer: Medicare Other | Admitting: Endocrinology

## 2015-06-13 VITALS — BP 136/84 | HR 93 | Temp 98.3°F | Ht 64.0 in | Wt 197.0 lb

## 2015-06-13 DIAGNOSIS — T887XXA Unspecified adverse effect of drug or medicament, initial encounter: Secondary | ICD-10-CM | POA: Insufficient documentation

## 2015-06-13 DIAGNOSIS — E1121 Type 2 diabetes mellitus with diabetic nephropathy: Secondary | ICD-10-CM | POA: Diagnosis not present

## 2015-06-13 DIAGNOSIS — N183 Chronic kidney disease, stage 3 (moderate): Secondary | ICD-10-CM | POA: Diagnosis not present

## 2015-06-13 DIAGNOSIS — E1122 Type 2 diabetes mellitus with diabetic chronic kidney disease: Secondary | ICD-10-CM | POA: Diagnosis not present

## 2015-06-13 DIAGNOSIS — T50905A Adverse effect of unspecified drugs, medicaments and biological substances, initial encounter: Secondary | ICD-10-CM

## 2015-06-13 LAB — POCT GLYCOSYLATED HEMOGLOBIN (HGB A1C): Hemoglobin A1C: 7.2

## 2015-06-13 MED ORDER — SITAGLIPTIN PHOSPHATE 100 MG PO TABS: 50.0000 mg | ORAL_TABLET | Freq: Every day | ORAL | Status: DC

## 2015-06-13 NOTE — Progress Notes (Signed)
Subjective:    Patient ID: Becky Forbes, female    DOB: 10-30-1940, 75 y.o.   MRN: [id number]  HPI Pt returns for f/u of diabetes mellitus: DM type: 2 Dx'ed: [id number] Complications: nephropathy, PAD, and retinopathy. Therapy: 2 oral meds GDM: never.  DKA: never.  Severe hypoglycemia: never.   Pancreatitis: never.  Other: she is not on metformin due to renal insuff; she had edema on pioglitizone, and diarrhea on acarbose; she could not afford invokana.  She stopped welchol due to lack of effect.  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 Interval hx: pt states she feels well in general.  She denies hypoglycemia.  She takes meds as rx'ed. Past Medical History  Diagnosis Date  . HTN (hypertension)   . Hyperlipemia   . Diabetes mellitus     x 10 yrs  . PAF (paroxysmal atrial fibrillation) ([place])   . Symptomatic sinus bradycardia     a. [place] DR  dual-chamber pacemaker 03/2014 by Dr. Rayann Heman.  . Chronic combined systolic and diastolic CHF (congestive heart failure) ([place])   . Typical atrial flutter ([place])   . Sick sinus syndrome ([place])   . CKD (chronic kidney disease), stage III   . GI bleed     a. 04/2012 - lower GIB - felt 2/2 mild ischemic colitis as shown on colonoscopy, was cleared for anticoag 1 week out from procedure.  . Ischemic colitis ([place])     a. 04/2012 - lower GIB - felt 2/2 mild ischemic colitis as shown on colonoscopy..  . Anemia   . Carotid artery disease ([place])     a. Carotid duplex in 2010: [id number] RICA, [id number] LICA.  Marland Kitchen Renal insufficiency   . Complication of anesthesia   . PONV (postoperative nausea and vomiting)   . Presence of permanent cardiac pacemaker 03/2014    Past Surgical History  Procedure Laterality Date  . Cataract extraction  05/25/07    RIGHT EYE  . Appendectomy    . Carpal tunnel release    . Abdominal hysterectomy    . Lumbar laminectomy    . Foot surgery  2006    MID FOOT RECONSTRUCTION  . Osteotomy   2006    AND FUSION  . Ganglion cyst excision  12/2007    Dr.Gramig  . Total knee arthroplasty  05/02/08, 07/25/08    Dr.Alusio  . Joint replacement      bilateral  . 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 Colonoscopy  01/31/2009    small external/internal hemorrhoids, diverticulosis in sigmoid colon, one small polyp (biopsied). Biospy essentially normal. Dr.Magod  . Colonoscopy  04/30/2012    Procedure: COLONOSCOPY;  Surgeon: Cleotis Nipper, MD;  Location: [facility] ENDOSCOPY;  Service: Endoscopy;  Laterality: N/A;  unprepped, poss flex only  . Permanent pacemaker insertion N/A 03/29/2014    STJ Assurity dual chamber paceamker implnated by Dr Rayann Heman    Social History   Social History  . Marital Status: Married    Spouse Name: Jeneen Rinks  . Number of Children: 2  . Years of Education: N/A   Occupational History  . 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 Smoking status: Never Smoker   . Smokeless tobacco: Never Used  . Alcohol Use: No  . Drug Use: No  . Sexual Activity: Not Currently   Other Topics  Concern  . Not on file   Social History Narrative    Current Outpatient Prescriptions on File Prior to Visit  Medication Sig Dispense Refill  . amiodarone (PACERONE) 200 MG tablet Take 1 tablet (200 mg total) by mouth daily. 90 tablet 3  . atorvastatin (LIPITOR) 20 MG tablet Take 1 tablet (20 mg total) by mouth daily. 90 tablet 3  . bromocriptine (PARLODEL) 2.5 MG tablet TAKE ONE TABLET BY MOUTH ONCE DAILY. 180 tablet 0  . Cholecalciferol (VITAMIN D PO) Take 800 Units by mouth daily.      . furosemide (LASIX) 20 MG tablet Take 1 tablet (20 mg total) by mouth daily as needed for fluid or edema. 90 tablet 1  . glucose blood (BAYER CONTOUR NEXT TEST) test strip 1 each by Other route daily. And lancets 1/day 100 each 12  . levothyroxine (SYNTHROID, LEVOTHROID) 25 MCG tablet Take 1 tablet (25 mcg total) by mouth daily before  breakfast. 30 tablet 2  . metoprolol (LOPRESSOR) 50 MG tablet Take 1 tablet by mouth two  times daily 180 tablet 0  . Multiple Vitamin (MULITIVITAMIN WITH MINERALS) TABS Take 1 tablet by mouth daily.    . potassium chloride (K-DUR,KLOR-CON) 10 MEQ tablet Take 10 mEq by mouth daily as needed (Potassium). Only when taking Lasix    . ramipril (ALTACE) 5 MG capsule Take 1 capsule (5 mg total) by mouth 2 (two) times daily. 180 capsule 3  . repaglinide (PRANDIN) 2 MG tablet Take 1 tablet (2 mg total) by mouth 3 (three) times daily before meals. 270 tablet 3   No current facility-administered medications on file prior to visit.    Allergies  Allergen Reactions  . Morphine And Related Itching, Nausea And Vomiting and Rash    01/04/15- patient says she is NOT allergic to morphine.  Morphine IV given this AM 01/04/15 in the ED. No adverse reaction.  . Actos [Pioglitazone Hydrochloride] Swelling  . Bactrim [Sulfamethoxazole-Trimethoprim] Nausea Only  . Januvia [Sitagliptin Phosphate] Other (See Comments)    Pt says this caused elevated liver enzymes and creatinine  . Percocet [Oxycodone-Acetaminophen] Nausea And Vomiting  . Hydrocodone-Acetaminophen Itching and Nausea And Vomiting    Per pt 05/12/15 she had this recently in the hospital with no issues at all  . Keflex [Cephalexin] Rash    Burning sensation all over    Family History  Problem Relation Age of Onset  . Coronary artery disease Father   . Hypertension Father   . Stroke Father   . Diabetes Father   . Kidney failure Mother   . Hypertension Mother   . Other Brother     SEPSIS  . Diabetes Brother   . Diabetes Sister   . Kidney failure Sister     BP 136/84 mmHg  Pulse 93  Temp(Src) 98.3 F (36.8 C) (Oral)  Ht 5\' 4"  (1.626 m)  Wt 197 lb (89.359 kg)  BMI 33.80 kg/m2  SpO2 95%  Review of Systems No weight change    Objective:   Physical Exam VITAL SIGNS:  See vs page.  GENERAL: no distress.  Pulses: dorsalis pedis intact  bilat.   MSK: no deformity of the feet. CV: no leg edema. Skin:  no ulcer on the feet.  normal color and temp on the feet.  Neuro: sensation is intact to touch on the feet.  Lab Results  Component Value Date   CREATININE 1.88* 06/12/2015   BUN 24* 06/12/2015   NA 133* 06/12/2015  K 4.9 06/12/2015   CL 99 06/12/2015   CO2 27 06/12/2015  A1c=7.2%    Assessment & Plan:  DM: Needs increased rx, if it can be done with a regimen that avoids or minimizes hypoglycemia. Renal insuff: pt says this has been exac by [place] in the past. Liver disorder: pt says was caused by [place] in the past.  Patient is advised the following: Patient Instructions  check your blood sugar once a day.  vary the time of day when you check, between before the 3 meals, and at bedtime.  also check if you have symptoms of your blood sugar being too high or too low.  please keep a record of the readings and bring it to your next appointment here.  please call us sooner if your blood sugar goes below 70, or if it stays over 200.  i have sent a prescription to your pharmacy, to add "[place]." Please come back for a follow-up appointment in 3 months.  Please redo the liver and kidney blood tests in 1 week (the abnormalities in the past were unlikely related to [place])

## 2015-06-13 NOTE — Patient Instructions (Addendum)
check your blood sugar once a day.  vary the time of day when you check, between before the 3 meals, and at bedtime.  also check if you have symptoms of your blood sugar being too high or too low.  please keep a record of the readings and bring it to your next appointment here.  please call us sooner if your blood sugar goes below 70, or if it stays over 200.  i have sent a prescription to your pharmacy, to add "[place]." Please come back for a follow-up appointment in 3 months.  Please redo the liver and kidney blood tests in 1 week (the abnormalities in the past were unlikely related to [place])

## 2015-06-14 ENCOUNTER — Telehealth: Payer: Self-pay | Admitting: Internal Medicine

## 2015-06-14 DIAGNOSIS — T462X1A Poisoning by other antidysrhythmic drugs, accidental (unintentional), initial encounter: Principal | ICD-10-CM

## 2015-06-14 DIAGNOSIS — E032 Hypothyroidism due to medicaments and other exogenous substances: Secondary | ICD-10-CM

## 2015-06-14 DIAGNOSIS — E119 Type 2 diabetes mellitus without complications: Secondary | ICD-10-CM | POA: Insufficient documentation

## 2015-06-14 DIAGNOSIS — N179 Acute kidney failure, unspecified: Secondary | ICD-10-CM

## 2015-06-14 DIAGNOSIS — N183 Chronic kidney disease, stage 3 unspecified: Secondary | ICD-10-CM

## 2015-06-14 MED ORDER — LEVOTHYROXINE SODIUM 50 MCG PO TABS: 50.0000 ug | ORAL_TABLET | Freq: Every day | ORAL | Status: DC

## 2015-06-14 NOTE — Telephone Encounter (Signed)
Thyroid function is still reduced.  Increase medication to 50 mcg daily.  Recheck tsh in 8 weeks.  (ordered tsh)  New rx sent to [facility] - can take two of what she has at home at the same time until that is gone.   Kidney function is worse.  We need to recheck this  - get blood work done in next couple of days  - does not need to fast.  (ordered)

## 2015-06-15 NOTE — Telephone Encounter (Signed)
Spoke with pt to inform.  

## 2015-06-16 ENCOUNTER — Other Ambulatory Visit: Payer: Self-pay | Admitting: Internal Medicine

## 2015-06-19 ENCOUNTER — Other Ambulatory Visit: Payer: Self-pay

## 2015-06-19 MED ORDER — BROMOCRIPTINE MESYLATE 2.5 MG PO TABS: 2.5000 mg | ORAL_TABLET | Freq: Every day | ORAL | Status: DC

## 2015-06-21 DIAGNOSIS — S60222D Contusion of left hand, subsequent encounter: Secondary | ICD-10-CM | POA: Diagnosis not present

## 2015-06-21 DIAGNOSIS — S62346D Nondisplaced fracture of base of fifth metacarpal bone, right hand, subsequent encounter for fracture with routine healing: Secondary | ICD-10-CM | POA: Diagnosis not present

## 2015-06-22 ENCOUNTER — Other Ambulatory Visit: Payer: Self-pay | Admitting: Internal Medicine

## 2015-06-22 ENCOUNTER — Other Ambulatory Visit (INDEPENDENT_AMBULATORY_CARE_PROVIDER_SITE_OTHER): Payer: Medicare Other

## 2015-06-22 DIAGNOSIS — N183 Chronic kidney disease, stage 3 unspecified: Secondary | ICD-10-CM

## 2015-06-22 DIAGNOSIS — N179 Acute kidney failure, unspecified: Secondary | ICD-10-CM | POA: Diagnosis not present

## 2015-06-22 DIAGNOSIS — E038 Other specified hypothyroidism: Secondary | ICD-10-CM

## 2015-06-22 DIAGNOSIS — T462X1A Poisoning by other antidysrhythmic drugs, accidental (unintentional), initial encounter: Principal | ICD-10-CM

## 2015-06-22 DIAGNOSIS — E032 Hypothyroidism due to medicaments and other exogenous substances: Secondary | ICD-10-CM

## 2015-06-22 LAB — COMPREHENSIVE METABOLIC PANEL
ALBUMIN: 3.6 g/dL (ref 3.5–5.2)
ALT: 59 U/L — ABNORMAL HIGH (ref 0–35)
AST: 75 U/L — ABNORMAL HIGH (ref 0–37)
Alkaline Phosphatase: 117 U/L (ref 39–117)
BILIRUBIN TOTAL: 0.9 mg/dL (ref 0.2–1.2)
BUN: 26 mg/dL — ABNORMAL HIGH (ref 6–23)
CALCIUM: 9.3 mg/dL (ref 8.4–10.5)
CO2: 25 meq/L (ref 19–32)
Chloride: 100 mEq/L (ref 96–112)
Creatinine, Ser: 1.32 mg/dL — ABNORMAL HIGH (ref 0.40–1.20)
GFR: 41.65 mL/min — AB (ref >60.00)
GLUCOSE: 132 mg/dL — AB (ref 70–99)
Potassium: 4.7 mEq/L (ref 3.5–5.1)
Sodium: 133 mEq/L — ABNORMAL LOW (ref 135–145)
Total Protein: 7.7 g/dL (ref 6.0–8.3)

## 2015-06-22 LAB — TSH: TSH: 7.64 u[IU]/mL — ABNORMAL HIGH (ref 0.35–4.50)

## 2015-07-10 DIAGNOSIS — S62346D Nondisplaced fracture of base of fifth metacarpal bone, right hand, subsequent encounter for fracture with routine healing: Secondary | ICD-10-CM | POA: Diagnosis not present

## 2015-07-10 DIAGNOSIS — S60222S Contusion of left hand, sequela: Secondary | ICD-10-CM | POA: Diagnosis not present

## 2015-07-10 DIAGNOSIS — S8001XD Contusion of right knee, subsequent encounter: Secondary | ICD-10-CM | POA: Diagnosis not present

## 2015-07-11 DIAGNOSIS — E113311 Type 2 diabetes mellitus with moderate nonproliferative diabetic retinopathy with macular edema, right eye: Secondary | ICD-10-CM | POA: Diagnosis not present

## 2015-07-11 DIAGNOSIS — H35341 Macular cyst, hole, or pseudohole, right eye: Secondary | ICD-10-CM | POA: Diagnosis not present

## 2015-07-11 DIAGNOSIS — H43812 Vitreous degeneration, left eye: Secondary | ICD-10-CM | POA: Diagnosis not present

## 2015-07-11 DIAGNOSIS — H53002 Unspecified amblyopia, left eye: Secondary | ICD-10-CM | POA: Diagnosis not present

## 2015-07-11 DIAGNOSIS — H3561 Retinal hemorrhage, right eye: Secondary | ICD-10-CM | POA: Diagnosis not present

## 2015-07-11 DIAGNOSIS — E113392 Type 2 diabetes mellitus with moderate nonproliferative diabetic retinopathy without macular edema, left eye: Secondary | ICD-10-CM | POA: Diagnosis not present

## 2015-07-17 ENCOUNTER — Other Ambulatory Visit: Payer: Self-pay

## 2015-07-17 MED ORDER — BROMOCRIPTINE MESYLATE 2.5 MG PO TABS: 2.5000 mg | ORAL_TABLET | Freq: Every day | ORAL | Status: DC

## 2015-07-25 ENCOUNTER — Ambulatory Visit (INDEPENDENT_AMBULATORY_CARE_PROVIDER_SITE_OTHER)
Admission: RE | Admit: 2015-07-25 | Discharge: 2015-07-25 | Disposition: A | Discharge status: Home / Self Care | Payer: Medicare Other | Source: Ambulatory Visit | Attending: Internal Medicine | Admitting: Internal Medicine

## 2015-07-25 DIAGNOSIS — S62609D Fracture of unspecified phalanx of unspecified finger, subsequent encounter for fracture with routine healing: Secondary | ICD-10-CM

## 2015-07-25 DIAGNOSIS — Z780 Asymptomatic menopausal state: Secondary | ICD-10-CM

## 2015-07-25 DIAGNOSIS — M8580 Other specified disorders of bone density and structure, unspecified site: Secondary | ICD-10-CM

## 2015-08-07 DIAGNOSIS — S62346D Nondisplaced fracture of base of fifth metacarpal bone, right hand, subsequent encounter for fracture with routine healing: Secondary | ICD-10-CM | POA: Diagnosis not present

## 2015-08-07 DIAGNOSIS — S60222S Contusion of left hand, sequela: Secondary | ICD-10-CM | POA: Diagnosis not present

## 2015-08-10 ENCOUNTER — Other Ambulatory Visit (INDEPENDENT_AMBULATORY_CARE_PROVIDER_SITE_OTHER): Payer: Medicare Other

## 2015-08-10 ENCOUNTER — Telehealth: Payer: Self-pay | Admitting: Emergency Medicine

## 2015-08-10 ENCOUNTER — Other Ambulatory Visit (INDEPENDENT_AMBULATORY_CARE_PROVIDER_SITE_OTHER): Payer: Medicare Other | Admitting: *Deleted

## 2015-08-10 DIAGNOSIS — E785 Hyperlipidemia, unspecified: Secondary | ICD-10-CM

## 2015-08-10 DIAGNOSIS — T50905A Adverse effect of unspecified drugs, medicaments and biological substances, initial encounter: Secondary | ICD-10-CM

## 2015-08-10 DIAGNOSIS — T887XXA Unspecified adverse effect of drug or medicament, initial encounter: Secondary | ICD-10-CM | POA: Diagnosis not present

## 2015-08-10 DIAGNOSIS — E038 Other specified hypothyroidism: Secondary | ICD-10-CM | POA: Diagnosis not present

## 2015-08-10 DIAGNOSIS — E039 Hypothyroidism, unspecified: Secondary | ICD-10-CM

## 2015-08-10 LAB — BASIC METABOLIC PANEL
BUN: 26 mg/dL — ABNORMAL HIGH (ref 6–23)
CALCIUM: 9.4 mg/dL (ref 8.4–10.5)
CHLORIDE: 100 meq/L (ref 96–112)
CO2: 30 meq/L (ref 19–32)
CREATININE: 1.54 mg/dL — AB (ref 0.40–1.20)
GFR: 34.85 mL/min — AB (ref >60.00)
Glucose, Bld: 130 mg/dL — ABNORMAL HIGH (ref 70–99)
POTASSIUM: 4.4 meq/L (ref 3.5–5.1)
Sodium: 137 mEq/L (ref 135–145)

## 2015-08-10 LAB — HEPATIC FUNCTION PANEL
ALT: 23 U/L (ref 0–35)
AST: 33 U/L (ref 0–37)
Albumin: 3.5 g/dL (ref 3.5–5.2)
Alkaline Phosphatase: 111 U/L (ref 39–117)
BILIRUBIN DIRECT: 0.2 mg/dL (ref 0.0–0.3)
TOTAL PROTEIN: 7.5 g/dL (ref 6.0–8.3)
Total Bilirubin: 0.9 mg/dL (ref 0.2–1.2)

## 2015-08-10 LAB — COMPREHENSIVE METABOLIC PANEL
ALBUMIN: 3.3 g/dL — AB (ref 3.6–5.1)
ALT: 22 U/L (ref 6–29)
AST: 33 U/L (ref 10–35)
Alkaline Phosphatase: 110 U/L (ref 33–130)
BILIRUBIN TOTAL: 0.8 mg/dL (ref 0.2–1.2)
BUN: 28 mg/dL — ABNORMAL HIGH (ref 7–25)
CALCIUM: 9 mg/dL (ref 8.6–10.4)
CO2: 25 mmol/L (ref 20–31)
Chloride: 101 mmol/L (ref 98–110)
Creat: 1.68 mg/dL — ABNORMAL HIGH (ref 0.60–0.93)
Glucose, Bld: 133 mg/dL — ABNORMAL HIGH (ref 65–99)
POTASSIUM: 4.5 mmol/L (ref 3.5–5.3)
Sodium: 137 mmol/L (ref 135–146)
Total Protein: 6.8 g/dL (ref 6.1–8.1)

## 2015-08-10 LAB — LIPID PANEL
CHOL/HDL RATIO: 3.1 ratio (ref <=5.0)
CHOLESTEROL: 117 mg/dL — AB (ref 125–200)
HDL: 38 mg/dL — AB (ref >=46)
LDL Cholesterol: 66 mg/dL (ref <130)
TRIGLYCERIDES: 65 mg/dL (ref <150)
VLDL: 13 mg/dL (ref <30)

## 2015-08-10 LAB — TSH: TSH: 10.44 u[IU]/mL — AB (ref 0.35–4.50)

## 2015-08-10 MED ORDER — LEVOTHYROXINE SODIUM 88 MCG PO TABS: 88.0000 ug | ORAL_TABLET | Freq: Every day | ORAL | Status: DC

## 2015-08-10 NOTE — Telephone Encounter (Signed)
-----   Message from Binnie Rail, MD sent at 08/10/2015  2:22 PM EDT ----- Her thyroid function is more interactive compared to 1 month ago. Please confirm she is taking her medication daily as prescribed and she should be taking 50 g daily. If she has been taking this daily please increased to 88 g daily. Recheck TSH in 2 months

## 2015-08-10 NOTE — Telephone Encounter (Signed)
New Dosage of Levothyroxine sent to Mail order, TSH ordered for 2 months.

## 2015-09-12 ENCOUNTER — Ambulatory Visit (INDEPENDENT_AMBULATORY_CARE_PROVIDER_SITE_OTHER): Payer: Medicare Other | Admitting: Endocrinology

## 2015-09-12 ENCOUNTER — Encounter: Payer: Self-pay | Admitting: Endocrinology

## 2015-09-12 VITALS — BP 152/86 | HR 101 | Temp 97.7°F | Ht 64.0 in | Wt 194.0 lb

## 2015-09-12 DIAGNOSIS — E1122 Type 2 diabetes mellitus with diabetic chronic kidney disease: Secondary | ICD-10-CM | POA: Diagnosis not present

## 2015-09-12 DIAGNOSIS — N183 Chronic kidney disease, stage 3 (moderate): Secondary | ICD-10-CM | POA: Diagnosis not present

## 2015-09-12 LAB — POCT GLYCOSYLATED HEMOGLOBIN (HGB A1C): HEMOGLOBIN A1C: 7

## 2015-09-12 NOTE — Progress Notes (Signed)
Subjective:    Patient ID: Becky Forbes, female    DOB: 04-Jul-1940, 75 y.o.   MRN: [id number]  HPI Pt returns for f/u of diabetes mellitus: DM type: 2 Dx'ed: [id number] Complications: PAD, renal insufficiency,and retinopathy. Therapy: 2 oral meds.  GDM: never.  DKA: never.  Severe hypoglycemia: never.   Pancreatitis: never.  Other: she is not on metformin due to renal insuff; she had edema on pioglitizone, and diarrhea on acarbose; she could not afford invokana.  She stopped welchol due to lack of effect.  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 Interval hx: pt states she feels well in general.  She takes meds as rx'ed. Past Medical History  Diagnosis Date  . HTN (hypertension)   . Hyperlipemia   . Diabetes mellitus     x 10 yrs  . PAF (paroxysmal atrial fibrillation) ([place])   . Symptomatic sinus bradycardia     a. [place] DR  dual-chamber pacemaker 03/2014 by Dr. Rayann Heman.  . Chronic combined systolic and diastolic CHF (congestive heart failure) ([place])   . Typical atrial flutter ([place])   . Sick sinus syndrome ([place])   . CKD (chronic kidney disease), stage III   . GI bleed     a. 04/2012 - lower GIB - felt 2/2 mild ischemic colitis as shown on colonoscopy, was cleared for anticoag 1 week out from procedure.  . Ischemic colitis ([place])     a. 04/2012 - lower GIB - felt 2/2 mild ischemic colitis as shown on colonoscopy..  . Anemia   . Carotid artery disease ([place])     a. Carotid duplex in 2010: [id number] RICA, [id number] LICA.  Marland Kitchen Renal insufficiency   . Complication of anesthesia   . PONV (postoperative nausea and vomiting)   . Presence of permanent cardiac pacemaker 03/2014    Past Surgical History  Procedure Laterality Date  . Cataract extraction  05/25/07    RIGHT EYE  . Appendectomy    . Carpal tunnel release    . Abdominal hysterectomy    . Lumbar laminectomy    . Foot surgery  2006    MID FOOT RECONSTRUCTION  . Osteotomy  2006    AND  FUSION  . Ganglion cyst excision  12/2007    Dr.Gramig  . Total knee arthroplasty  05/02/08, 07/25/08    Dr.Alusio  . Joint replacement      bilateral  . 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 Colonoscopy  01/31/2009    small external/internal hemorrhoids, diverticulosis in sigmoid colon, one small polyp (biopsied). Biospy essentially normal. Dr.Magod  . Colonoscopy  04/30/2012    Procedure: COLONOSCOPY;  Surgeon: Cleotis Nipper, MD;  Location: [facility] ENDOSCOPY;  Service: Endoscopy;  Laterality: N/A;  unprepped, poss flex only  . Permanent pacemaker insertion N/A 03/29/2014    STJ Assurity dual chamber paceamker implnated by Dr Rayann Heman    Social History   Social History  . Marital Status: Married    Spouse Name: Jeneen Rinks  . Number of Children: 2  . Years of Education: N/A   Occupational History  . 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 Smoking status: Never Smoker   . Smokeless tobacco: Never Used  . Alcohol Use: No  . Drug Use: No  . Sexual Activity: Not Currently   Other Topics Concern  .  Not on file   Social History Narrative    Current Outpatient Prescriptions on File Prior to Visit  Medication Sig Dispense Refill  . amiodarone (PACERONE) 200 MG tablet Take 1 tablet (200 mg total) by mouth daily. 90 tablet 3  . atorvastatin (LIPITOR) 20 MG tablet Take 1 tablet (20 mg total) by mouth daily. 90 tablet 3  . bromocriptine (PARLODEL) 2.5 MG tablet Take 1 tablet (2.5 mg total) by mouth daily. 90 tablet 2  . Cholecalciferol (VITAMIN D PO) Take 800 Units by mouth daily.      . furosemide (LASIX) 20 MG tablet Take 1 tablet (20 mg total) by mouth daily as needed for fluid or edema. 90 tablet 1  . glucose blood (BAYER CONTOUR NEXT TEST) test strip 1 each by Other route daily. And lancets 1/day 100 each 12  . levothyroxine (SYNTHROID, LEVOTHROID) 88 MCG tablet Take 1 tablet (88 mcg total) by mouth daily. 90 tablet 0  .  metoprolol (LOPRESSOR) 50 MG tablet Take 1 tablet by mouth two  times daily 180 tablet 0  . Multiple Vitamin (MULITIVITAMIN WITH MINERALS) TABS Take 1 tablet by mouth daily.    . potassium chloride (K-DUR,KLOR-CON) 10 MEQ tablet Take 10 mEq by mouth daily as needed (Potassium). Only when taking Lasix    . ramipril (ALTACE) 5 MG capsule Take 1 capsule (5 mg total) by mouth 2 (two) times daily. 180 capsule 3  . repaglinide (PRANDIN) 2 MG tablet Take 1 tablet (2 mg total) by mouth 3 (three) times daily before meals. 270 tablet 3  . sitaGLIPtin (JANUVIA) 100 MG tablet Take 0.5 tablets (50 mg total) by mouth daily. 45 tablet 3   No current facility-administered medications on file prior to visit.    Allergies  Allergen Reactions  . Morphine And Related Itching, Nausea And Vomiting and Rash    01/04/15- patient says she is NOT allergic to morphine.  Morphine IV given this AM 01/04/15 in the ED. No adverse reaction.  . Actos [Pioglitazone Hydrochloride] Swelling  . Bactrim [Sulfamethoxazole-Trimethoprim] Nausea Only  . Januvia [Sitagliptin Phosphate] Other (See Comments)    Pt says this caused elevated liver enzymes and creatinine  . Percocet [Oxycodone-Acetaminophen] Nausea And Vomiting  . Hydrocodone-Acetaminophen Itching and Nausea And Vomiting    Per pt 05/12/15 she had this recently in the hospital with no issues at all  . Keflex [Cephalexin] Rash    Burning sensation all over    Family History  Problem Relation Age of Onset  . Coronary artery disease Father   . Hypertension Father   . Stroke Father   . Diabetes Father   . Kidney failure Mother   . Hypertension Mother   . Other Brother     SEPSIS  . Diabetes Brother   . Diabetes Sister   . Kidney failure Sister     BP 152/86 mmHg  Pulse 101  Temp(Src) 97.7 F (36.5 C) (Oral)  Ht 5\' 4"  (1.626 m)  Wt 194 lb (87.998 kg)  BMI 33.28 kg/m2  SpO2 97%   Review of Systems She denies hypoglycemia    Objective:   Physical  Exam VITAL SIGNS:  See vs page GENERAL: no distress Pulses: dorsalis pedis intact bilat.  MSK: no deformity of the feet CV: no leg edema Skin: no ulcer on the feet. normal color and temp on the feet. Neuro: sensation is intact to touch on the feet.  Ext: There is bilateral onychomycosis of the toenails.    A1c=7.0%  Assessment & Plan:  Type 2 DM: she needs increased rx, if it can be done with a regimen that avoids or minimizes hypoglycemia, but she declines additional rx.  Patient is advised the following: Patient Instructions  check your blood sugar once a day.  vary the time of day when you check, between before the 3 meals, and at bedtime.  also check if you have symptoms of your blood sugar being too high or too low.  please keep a record of the readings and bring it to your next appointment here.  please call us sooner if your blood sugar goes below 70, or if it stays over 200.  Please continue the same medications for diabetes.   Please come back for a follow-up appointment in 4 months.    Renato Shin

## 2015-09-12 NOTE — Patient Instructions (Signed)
check your blood sugar once a day.  vary the time of day when you check, between before the 3 meals, and at bedtime.  also check if you have symptoms of your blood sugar being too high or too low.  please keep a record of the readings and bring it to your next appointment here.  please call us sooner if your blood sugar goes below 70, or if it stays over 200.  Please continue the same medications for diabetes.   Please come back for a follow-up appointment in 4 months.

## 2015-09-26 DIAGNOSIS — E113311 Type 2 diabetes mellitus with moderate nonproliferative diabetic retinopathy with macular edema, right eye: Secondary | ICD-10-CM | POA: Diagnosis not present

## 2015-10-25 ENCOUNTER — Other Ambulatory Visit: Payer: Self-pay | Admitting: Endocrinology

## 2015-10-25 ENCOUNTER — Encounter: Payer: Self-pay | Admitting: Cardiovascular Disease

## 2015-10-25 ENCOUNTER — Ambulatory Visit (INDEPENDENT_AMBULATORY_CARE_PROVIDER_SITE_OTHER): Payer: Medicare Other | Admitting: Cardiovascular Disease

## 2015-10-25 VITALS — BP 140/72 | HR 61 | Ht 64.0 in | Wt 194.1 lb

## 2015-10-25 DIAGNOSIS — I480 Paroxysmal atrial fibrillation: Secondary | ICD-10-CM

## 2015-10-25 DIAGNOSIS — I10 Essential (primary) hypertension: Secondary | ICD-10-CM | POA: Diagnosis not present

## 2015-10-25 DIAGNOSIS — I5042 Chronic combined systolic (congestive) and diastolic (congestive) heart failure: Secondary | ICD-10-CM | POA: Diagnosis not present

## 2015-10-25 NOTE — Progress Notes (Signed)
Cardiology Office Note   Date:  10/25/2015   ID:  Becky Forbes, DOB January 22, 1941, MRN [id number]  PCP:  Binnie Rail, MD  Cardiologist:   Mertie Moores, MD   Chief Complaint  Patient presents with  . Follow-up    CHF   1. Atrial fibrillation / atrial Flutter - paroxysmal 2. Diabetes mellitus 3. Hypertension 4. Hyperlipidemia 5. Chronic systolic CHF - EF 123456 (assumed to be nonischemic) 6. S/p pacer :   7. Carotid artery disease - mod - severe Left internal carotid stenosis by Duplex in 2010   History of Present Illness:  [name] not had any episodes of chest pain or shortness of breath. She had back surgery earlier this year. [name] not had any chest pain or shortness breath. Her blood pressure has been well controlled. [name] not had any episodes of atrial fibrillation.  Feb. 4, 2014: She was admitted to the hospital in mid January with a GI bleed and was noted to be in atrial fibrillation. She had an echocardiogram which revealed an ejection fraction of 35-40%. She started on Lasix 20 twice a day and metoprolol. She had some blood work drawn last week which revealed a mild increase in her creatinine up to 1.9. She was also found to have a potassium of 6.1. Recheck potassium level was 4.6. [name] noted that her blood pressures been quite low since she left the hospital. She also was having symptoms of orthostatic hypotension.  July 23, 2012:  Becky Forbes is doing well. No further episodes of Afib and no blood in stool. She was admitted to the hospital in January with GI bleeding was found to have atrial fibrillation. Workup at that time revealed what appeared to be ischemic colitis. Her aspirin was held for a week. She improved and has not had any further bleeding. We have been able to restart Coumadin she's not had any bleeding. The bruising that she was having is now better since she has been off the aspirin.  She has a nonischemic cardiomyopathy. Echocardiogram in January  revealed an ejection fraction of 35-40%. She has moderate pulmonary hypertension with an estimated PA pressure of 49. I have reviewed the echo and the results are noted below. Left ventricle: The cavity size was normal. Wall thickness was increased in a pattern of mild LVH. Systolic function was moderately reduced. The estimated ejection fraction was in the range of 35% to 40%. Diffuse hypokinesis. - Mitral valve: Mild regurgitation. - Left atrium: The atrium was mildly dilated. - Right ventricle: The cavity size was mildly dilated. Systolic function was mildly reduced. - Right atrium: The atrium was mildly dilated. - Pulmonary arteries: Systolic pressure was moderately increased. PA peak pressure: 55mm Hg   She continues to have generalized fatigue and malaise.  She has chronic renal insufficiency.  Oct. 23, 2014:  Becky Forbes is in atrial flutter today. No symptoms. breathing is ok. She walks on occasion. [name] not able to tell if her heart rate is irregular or not.  August 03, 2013:   Becky Forbes is doing well. No Cp or dyspnea. Perhaps mild DOE with walking  Echo April 2015 showed improved LV function.  Left ventricle: The cavity size was normal. Systolic function was mildly to moderately reduced. The estimated ejection fraction was in the range of 40% to 45%. Severe hypokinesis of the basal-midinferolateral and inferior myocardium. The study is not technically sufficient to allow evaluation of LV diastolic function. - Mitral valve: Calcified annulus. Mild to moderate regurgitation directed centrally. -  Left atrium: The atrium was severely dilated. - Right atrium: The atrium was moderately dilated  September 22, 2013: Becky Forbes is feeling a little bit better. She seems to be tolerating the amiodarone better than the high dose of metoprolol. [name] not having episodes of chest pain. She thinks her heart rate is better.  Her blood pressure here in the office is a little bit elevated. Her  blood pressures at home have been all in the normal range.  Sept. 10, 2015:  Becky Forbes is feeling quite well. She has not noticed any heart rate irregularities. She has felt a lot better since decreasing the dose of amiodarone to 200 mg a day.   Feb. 25, 2016:   Becky Forbes is a 75 y.o. female who presents for follow-up of her atrial fibrillation. Since I last saw her she's had a pacemaker placed.  She is feeling well.  Has some tenderness and " lumpyness around the pacer for the past week.    still has some dizziness.   12/13/2014: Still has lack of energy  Fatigues with walking .    06/13/2014:  Becky Forbes presents today following a hospitalization for GI bleed. She was admitted on September 21 with an acute GI bleed. This was despite the fact that she had been off her Coumadin for the previous 7 days and her INR was only 1.8. She was stabilized. She is seen back today.  [name] feeling better and has not had any further bleeding.  She has maintained NSR.    Jan. 27, 2017:  Doing ok from a cardiac standpoint. She fell over the Christmas holidays - tripped over some  broken pavement .  BP has been normal at home.   Slightly elevated here - she has not had her meds this am . She had moderate carotid artery disease by duplex scan in 2010. [name] due for a repeat duplex scan. [name] not having any neurologic symptoms. She denies any chest pain or shortness of breath. She does not get much exercise. She is limited by back pain  October 25, 2015:    No CP or dyspnea  BP is a bit high. Trying to watch her diet.   No recent episodes of atrial fib   Past Medical History  Diagnosis Date  . HTN (hypertension)   . Hyperlipemia   . Diabetes mellitus     x 10 yrs  . PAF (paroxysmal atrial fibrillation) ([place])   . Symptomatic sinus bradycardia     a. [place] DR  dual-chamber pacemaker 03/2014 by Dr. Rayann Heman.  . Chronic combined systolic and diastolic CHF (congestive heart failure)  ([place])   . Typical atrial flutter ([place])   . Sick sinus syndrome ([place])   . CKD (chronic kidney disease), stage III   . GI bleed     a. 04/2012 - lower GIB - felt 2/2 mild ischemic colitis as shown on colonoscopy, was cleared for anticoag 1 week out from procedure.  . Ischemic colitis ([place])     a. 04/2012 - lower GIB - felt 2/2 mild ischemic colitis as shown on colonoscopy..  . Anemia   . Carotid artery disease ([place])     a. Carotid duplex in 2010: [id number] RICA, [id number] LICA.  Marland Kitchen Renal insufficiency   . Complication of anesthesia   . PONV (postoperative nausea and vomiting)   . Presence of permanent cardiac pacemaker 03/2014    Past Surgical History  Procedure Laterality Date  . Cataract extraction  05/25/07  RIGHT EYE  . Appendectomy    . Carpal tunnel release    . Abdominal hysterectomy    . Lumbar laminectomy    . Foot surgery  2006    MID FOOT RECONSTRUCTION  . Osteotomy  2006    AND FUSION  . Ganglion cyst excision  12/2007    Dr.Gramig  . Total knee arthroplasty  05/02/08, 07/25/08    Dr.Alusio  . Joint replacement      bilateral  . 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 Colonoscopy  01/31/2009    small external/internal hemorrhoids, diverticulosis in sigmoid colon, one small polyp (biopsied). Biospy essentially normal. Dr.Magod  . Colonoscopy  04/30/2012    Procedure: COLONOSCOPY;  Surgeon: Cleotis Nipper, MD;  [facility]: [facility] ENDOSCOPY;  Service: Endoscopy;  Laterality: N/A;  unprepped, poss flex only  . Permanent pacemaker insertion N/A 03/29/2014    STJ Assurity dual chamber paceamker implnated by Dr Rayann Heman     Current Outpatient Prescriptions  Medication Sig Dispense Refill  . amiodarone (PACERONE) 200 MG tablet Take 1 tablet (200 mg total) by mouth daily. 90 tablet 3  . atorvastatin (LIPITOR) 20 MG tablet Take 1 tablet (20 mg total) by mouth daily. 90 tablet 3  . bromocriptine (PARLODEL) 2.5 MG tablet Take 1 tablet (2.5 mg total) by mouth daily. 90 tablet  2  . Cholecalciferol (VITAMIN D PO) Take 800 Units by mouth daily.      . furosemide (LASIX) 20 MG tablet Take 1 tablet (20 mg total) by mouth daily as needed for fluid or edema. 90 tablet 1  . glucose blood (BAYER CONTOUR NEXT TEST) test strip 1 each by Other route daily. And lancets 1/day 100 each 12  . levothyroxine (SYNTHROID, LEVOTHROID) 88 MCG tablet Take 1 tablet (88 mcg total) by mouth daily. 90 tablet 0  . metoprolol (LOPRESSOR) 50 MG tablet Take 1 tablet by mouth two  times daily 180 tablet 0  . Multiple Vitamin (MULITIVITAMIN WITH MINERALS) TABS Take 1 tablet by mouth daily.    . potassium chloride (K-DUR,KLOR-CON) 10 MEQ tablet Take 10 mEq by mouth daily as needed (Potassium). Only when taking Lasix    . ramipril (ALTACE) 5 MG capsule Take 1 capsule (5 mg total) by mouth 2 (two) times daily. 180 capsule 3  . repaglinide (PRANDIN) 2 MG tablet Take 1 tablet by mouth 3  times daily before meals 270 tablet 0  . sitaGLIPtin (JANUVIA) 100 MG tablet Take 0.5 tablets (50 mg total) by mouth daily. 45 tablet 3   No current facility-administered medications for this visit.    Allergies:   Morphine and related; Actos; Bactrim; Januvia; Percocet; Hydrocodone-acetaminophen; and Keflex    Social History:  The patient  reports that she has never smoked. She has never used smokeless tobacco. She reports that she does not drink alcohol or use illicit drugs.   Family History:  The patient's family history includes Coronary artery disease in her father; Diabetes in her brother, father, and sister; Hypertension in her father and mother; Kidney failure in her mother and sister; Other in her brother; Stroke in her father.    ROS:  Please see the history of present illness.    Review of Systems: Constitutional:  denies fever, chills, diaphoresis, appetite change and fatigue.  HEENT: denies photophobia, eye pain, redness, hearing loss, ear pain, congestion, sore throat, rhinorrhea, sneezing, neck  pain, neck stiffness and tinnitus.  Respiratory: denies SOB, DOE, cough, chest tightness,  and wheezing.  Cardiovascular: denies chest pain, palpitations and leg swelling.  Gastrointestinal: denies nausea, vomiting, abdominal pain, diarrhea, constipation, blood in stool.  Genitourinary: denies dysuria, urgency, frequency, hematuria, flank pain and difficulty urinating.  Musculoskeletal: denies  myalgias, back pain, joint swelling, arthralgias and gait problem.   Skin: denies pallor, rash and wound.  Neurological: denies dizziness, seizures, syncope, weakness, light-headedness, numbness and headaches.   Hematological: denies adenopathy, easy bruising, personal or family bleeding history.  Psychiatric/ Behavioral: denies suicidal ideation, mood changes, confusion, nervousness, sleep disturbance and agitation.   All other systems are reviewed and negative.   PHYSICAL EXAM: VS:  BP 150/70 mmHg  Pulse 61  Ht 5\' 4"  (1.626 m)  Wt 194 lb 1.9 oz (88.052 kg)  BMI 33.30 kg/m2 , BMI Body mass index is 33.3 kg/(m^2). GEN: Well nourished, well developed, in no acute distress HEENT: normal Neck: no JVD, carotid bruits, or masses Cardiac: RRR; no murmurs, rubs, or gallops,no edema  Respiratory:  clear to auscultation bilaterally, normal work of breathing GI: soft, nontender, nondistended, + BS MS: no deformity or atrophy Skin: warm and dry, no rash Neuro:  Strength and sensation are intact Psych: normal  EKG:  EKG is ordered today. The ekg ordered today demonstrates atrial pacing at 60 .    Recent Labs: 01/16/2015: Hemoglobin 11.0*; Platelets 326.0 08/10/2015: ALT 23; BUN 26*; Creatinine, Ser 1.54*; Potassium 4.4; Sodium 137; TSH 10.44*   Lipid Panel    Component Value Date/Time   CHOL 117* 08/10/2015 0736   TRIG 65 08/10/2015 0736   TRIG 108 04/21/2006 1234   HDL 38* 08/10/2015 0736   CHOLHDL 3.1 08/10/2015 0736   CHOLHDL 2.8 CALC 04/21/2006 1234   VLDL 13 08/10/2015 0736   LDLCALC 66  08/10/2015 0736      Wt Readings from Last 3 Encounters:  10/25/15 194 lb 1.9 oz (88.052 kg)  09/12/15 194 lb (87.998 kg)  06/13/15 197 lb (89.359 kg)      Other studies Reviewed: Additional studies/ records that were reviewed today include: . Review of the above records demonstrates:    ASSESSMENT AND PLAN:  1. Atrial fibrillation / atrial Flutter - paroxysmal - she is maintainin NSR , on  amiodarone to 200 a day Will consider lowering that does at her next visit    she is currently off Coumadin. She had a major GI bleed despite the fact that she had a subtherapeutic INR.   2. Diabetes mellitus 3. Hypertension - BP is well controlled.   4. Hyperlipidemia -  her LDL was mildly elevated at her last lab check in August. We'll increase her atorvastatin 20 mg and will check fasting lipids, liver enzymes, and basic medical profile in 3 months.  5. Chronic systolic CHF - EF 123456 (assumed to be nonischemic) - her EF has improved to 55% by echo in Dec. 2015.   6. S/p pacer :   She is doing well , pacer is working ok  7 carotid artery disease: She has known history of carotid artery disease with the left greater than right. Her last duplex scan was in 2010. We will repeat carotid duplex scan.  Current medicines are reviewed at length with the patient today.  The patient does not have concerns regarding medicines.  The following changes have been made:  no change   Disposition:   FU with me in 6 months     Signed, Mertie Moores, MD  10/25/2015 10:52 AM    [facility] [address] [place], [place]  [address] Phone: [phone]; Fax: [phone]

## 2015-10-25 NOTE — Patient Instructions (Signed)

## 2015-11-16 ENCOUNTER — Other Ambulatory Visit: Payer: Self-pay | Admitting: Internal Medicine

## 2015-11-16 ENCOUNTER — Other Ambulatory Visit: Payer: Self-pay | Admitting: Cardiovascular Disease

## 2015-12-04 DIAGNOSIS — M4316 Spondylolisthesis, lumbar region: Secondary | ICD-10-CM | POA: Diagnosis not present

## 2015-12-04 DIAGNOSIS — M5416 Radiculopathy, lumbar region: Secondary | ICD-10-CM | POA: Diagnosis not present

## 2015-12-04 DIAGNOSIS — M545 Low back pain: Secondary | ICD-10-CM | POA: Diagnosis not present

## 2015-12-04 DIAGNOSIS — Z6833 Body mass index (BMI) 33.0-33.9, adult: Secondary | ICD-10-CM | POA: Diagnosis not present

## 2015-12-04 DIAGNOSIS — M5136 Other intervertebral disc degeneration, lumbar region: Secondary | ICD-10-CM | POA: Diagnosis not present

## 2015-12-04 DIAGNOSIS — M4806 Spinal stenosis, lumbar region: Secondary | ICD-10-CM | POA: Diagnosis not present

## 2015-12-12 ENCOUNTER — Other Ambulatory Visit: Payer: Self-pay | Admitting: Neurosurgery

## 2015-12-12 ENCOUNTER — Other Ambulatory Visit (HOSPITAL_COMMUNITY): Payer: Self-pay | Admitting: Neurosurgery

## 2015-12-12 DIAGNOSIS — M48061 Spinal stenosis, lumbar region without neurogenic claudication: Secondary | ICD-10-CM

## 2015-12-12 DIAGNOSIS — E113311 Type 2 diabetes mellitus with moderate nonproliferative diabetic retinopathy with macular edema, right eye: Secondary | ICD-10-CM | POA: Diagnosis not present

## 2015-12-15 ENCOUNTER — Encounter (HOSPITAL_COMMUNITY): Payer: Self-pay

## 2015-12-15 ENCOUNTER — Ambulatory Visit (HOSPITAL_COMMUNITY)
Admission: RE | Admit: 2015-12-15 | Discharge: 2015-12-15 | Disposition: A | Discharge status: Home / Self Care | Payer: Medicare Other | Source: Ambulatory Visit | Attending: Neurosurgery | Admitting: Neurosurgery

## 2015-12-15 DIAGNOSIS — M4806 Spinal stenosis, lumbar region: Secondary | ICD-10-CM | POA: Insufficient documentation

## 2015-12-15 DIAGNOSIS — M48061 Spinal stenosis, lumbar region without neurogenic claudication: Secondary | ICD-10-CM

## 2015-12-15 DIAGNOSIS — M5126 Other intervertebral disc displacement, lumbar region: Secondary | ICD-10-CM | POA: Insufficient documentation

## 2015-12-15 DIAGNOSIS — Z79899 Other long term (current) drug therapy: Secondary | ICD-10-CM | POA: Diagnosis not present

## 2015-12-15 DIAGNOSIS — Z981 Arthrodesis status: Secondary | ICD-10-CM | POA: Insufficient documentation

## 2015-12-15 LAB — GLUCOSE, CAPILLARY: Glucose-Capillary: 147 mg/dL — ABNORMAL HIGH (ref 65–99)

## 2015-12-15 MED ORDER — ONDANSETRON HCL 4 MG/2ML IJ SOLN: 4.0000 mg | Freq: Four times a day (QID) | INTRAMUSCULAR | Status: DC | PRN

## 2015-12-15 MED ORDER — LIDOCAINE HCL (PF) 1 % IJ SOLN
2.0000 mL | Freq: Once | INTRAMUSCULAR | Status: AC
  Administered 2015-12-15: 2 mL via INTRADERMAL

## 2015-12-15 MED ORDER — IOPAMIDOL (ISOVUE-M 200) INJECTION 41%
INTRAMUSCULAR | Status: AC
  Filled 2015-12-15: qty 10

## 2015-12-15 MED ORDER — LIDOCAINE HCL 1 % IJ SOLN
INTRAMUSCULAR | Status: AC
Start: 2015-12-15 — End: 2015-12-15
  Filled 2015-12-15: qty 10

## 2015-12-15 MED ORDER — IOPAMIDOL (ISOVUE-M 200) INJECTION 41%
20.0000 mL | Freq: Once | INTRAMUSCULAR | Status: AC
  Administered 2015-12-15: 20 mL via INTRATHECAL

## 2015-12-15 MED ORDER — DIAZEPAM 5 MG PO TABS
10.0000 mg | ORAL_TABLET | Freq: Once | ORAL | Status: AC
  Administered 2015-12-15: 10 mg via ORAL

## 2015-12-15 MED ORDER — DIAZEPAM 5 MG PO TABS
ORAL_TABLET | ORAL | Status: AC
  Filled 2015-12-15: qty 2

## 2015-12-15 NOTE — Discharge Instructions (Signed)
Myelography, Care After °These instructions give you information on caring for yourself after your procedure. Your doctor may also give you more specific instructions. Call your doctor if you have any problems or questions after your procedure. °HOME CARE °· Rest the first day. °· When you rest, lie flat, with your head slightly raised (elevated). °· Avoid heavy lifting and activity for 48 hours, or as told by your doctor. °· You may take the bandage (dressing) off one day after the test, or as told by your doctor. °· Take all medicines only as told by your doctor. °· Ask your doctor when it is okay to take a shower or bath. °· Ask your doctor when your test results will be ready and how you can get them. Make sure you follow up and get your results. °· Do not drink alcohol for 24 hours, or as told by your doctor. °· Drink enough fluid to keep your pee (urine) clear or pale yellow. °GET HELP IF:  °· You have a fever. °· You have a headache. °· You feel sick to your stomach (nauseous) or throw up (vomit). °· You have pain or cramping in your belly (abdomen). °GET HELP RIGHT AWAY IF:  °· You have a headache with a stiff neck or fever. °· You have trouble breathing. °· Any of the places where the needles were put in are: °¨ Puffy (swollen) or red. °¨ Sore or hot to the touch. °¨ Draining yellowish-white fluid (pus). °¨ Bleeding. °MAKE SURE YOU: °· Understand these instructions. °· Will watch your condition. °· Will get help right away if you are not doing well or get worse. °  °This information is not intended to replace advice given to you by your health care provider. Make sure you discuss any questions you have with your health care provider. °  °Document Released: 01/09/2008 Document Revised: 04/22/2014 Document Reviewed: 12/25/2011 °Elsevier Interactive Patient Education ©2016 [employer] ° °

## 2015-12-15 NOTE — Procedures (Signed)
Lumbar myelogram with 15 cc Omnipaque 180 at L 12 level.  Patient tolerated well.  No apparent complications.

## 2015-12-28 DIAGNOSIS — M5126 Other intervertebral disc displacement, lumbar region: Secondary | ICD-10-CM | POA: Diagnosis not present

## 2015-12-28 DIAGNOSIS — M4316 Spondylolisthesis, lumbar region: Secondary | ICD-10-CM | POA: Diagnosis not present

## 2015-12-28 DIAGNOSIS — M4806 Spinal stenosis, lumbar region: Secondary | ICD-10-CM | POA: Diagnosis not present

## 2015-12-28 DIAGNOSIS — M545 Low back pain: Secondary | ICD-10-CM | POA: Diagnosis not present

## 2015-12-29 ENCOUNTER — Other Ambulatory Visit: Payer: Self-pay | Admitting: Neurosurgery

## 2016-01-02 NOTE — H&P (Signed)
Patient ID:   [id number] Patient: Becky Forbes  Date of Birth: [birth year] Visit Type: Office Visit   Date: 12/28/2015 12:15 PM Provider: Marchia Meiers. Vertell Limber MD   This 75 year old female presents for back pain.  History of Present Illness: 1.  back pain    Patient returns to review her lumbar myelogram.  The patient returns she is miserable.  She is not able to get any relief with changing position.  She says her right leg and hip and buttock are worse than her left but both legs are bothering her a great deal.  Her myelogram demonstrates solid arthrodesis at the L3 through S1 levels with well-positioned hardware without loosening but with severe stenosis and degeneration with an extruded disc fragment at the L2-3 level worse on the left but also centrally.  There is almost a complete block at this level.  The remaining levels of her lumbar spine appear adequate.  Based on the severity of her pain complaints and the fact that she is not able to get relief despite change in position have recommended that she undergo exploration of fusion with decompression fusion at the L2-3 level.  I considered doing this as an XLIF procedure, but the severity of stenosis, the extruded disc fragment, and the dorsal element hypertrophy from a structural standpoint as well as the fact that she is not able to get any relief with change in position we need to recommend posterior surgery rather than an anterolateral approach.       PAST MEDICAL HISTORY, SURGICAL HISTORY, FAMILY HISTORY, SOCIAL HISTORY AND REVIEW OF SYSTEMS I have reviewed the patient's past medical, surgical, family and social history as well as the comprehensive review of systems as included on the [facility] history form dated 12/04/2015, which I have signed.   MEDICATIONS(added, continued or stopped this visit): Started Medication Directions Instruction Stopped   amiodarone 200 mg tablet take 1 tablet by oral route   every day     Januvia 50 mg tablet take 1 tablet by oral route  every day     Lasix 20 mg tablet take 1 tablet by oral route  every day     metoprolol succinate ER 25 mg tablet,extended release 24 hr take 1 tablet by oral route  every day     ramipril 5 mg capsule take 1 capsule by oral route  every day     repaglinide 2 mg tablet take 1 tablet by oral route 3 times every day 15 to 30 minutes before meals     Tirosint 88 mcg capsule take 1 capsule by oral route  every day       ALLERGIES: Ingredient Reaction Medication Name Comment  MORPHINE     CODEINE         Vitals Date Temp F BP Pulse Ht In Wt Lb BMI BSA Pain Score  12/28/2015  164/82 82 64 195 33.47  4/10      IMPRESSION The patient has severe spinal stenosis with a large ruptured disc and degeneration at the L2-3 level with severe pain into both legs which is incapacitating to her  Assessment/Plan # Detail Type Description   1. Assessment Spondylolisthesis, lumbar region (M43.16).   Plan Orders Aspen Lo Sag Rigid Panel Quick.       2. Assessment Spinal stenosis of lumbar region (M48.06).       3. Assessment Low back pain, unspecified back pain laterality, with sciatica presence unspecified (M54.5).  4. Assessment Disc displacement, lumbar (M51.26).         I recommended exploration of previous fusion with extension through L2-3 level.  The patient wishes to proceed with surgery.  This is been set up on an expedited basis because of the severity of her pain complaints.  Orders: Diagnostic Procedures: Assessment Procedure  M51.26 Exploration and Extension of Fusion - L2-L3  M54.5 Lumbar Spine- AP/Lat  Miscellaneous: Assessment   M43.16 Aspen Lo Sag Rigid Panel Quick             Provider:  Marchia Meiers. Vertell Limber MD  12/28/2015 12:57 PM Dictation edited by: Marchia Meiers. Vertell Limber    CC Providers: Sallee Lange [facility] [address] [place], [place]  [id number]              Electronically signed by Marchia Meiers. Vertell Limber MD on 12/28/2015 12:57 PM

## 2016-01-03 ENCOUNTER — Encounter (HOSPITAL_COMMUNITY)
Admission: RE | Admit: 2016-01-03 | Discharge: 2016-01-03 | Disposition: A | Discharge status: Home / Self Care | Payer: Medicare Other | Source: Ambulatory Visit | Attending: Neurosurgery | Admitting: Neurosurgery

## 2016-01-03 ENCOUNTER — Encounter (HOSPITAL_COMMUNITY): Payer: Self-pay

## 2016-01-03 HISTORY — DX: Cardiac murmur, unspecified: R01.1

## 2016-01-03 HISTORY — DX: Unspecified osteoarthritis, unspecified site: M19.90

## 2016-01-03 HISTORY — DX: Hypothyroidism, unspecified: E03.9

## 2016-01-03 LAB — BASIC METABOLIC PANEL
ANION GAP: 9 (ref 5–15)
BUN: 24 mg/dL — ABNORMAL HIGH (ref 6–20)
CALCIUM: 9.4 mg/dL (ref 8.9–10.3)
CO2: 26 mmol/L (ref 22–32)
CREATININE: 1.89 mg/dL — AB (ref 0.44–1.00)
Chloride: 102 mmol/L (ref 101–111)
GFR, EST AFRICAN AMERICAN: 29 mL/min — AB (ref >60)
GFR, EST NON AFRICAN AMERICAN: 25 mL/min — AB (ref >60)
GLUCOSE: 99 mg/dL (ref 65–99)
Potassium: 4.4 mmol/L (ref 3.5–5.1)
Sodium: 137 mmol/L (ref 135–145)

## 2016-01-03 LAB — CBC
HCT: 35.8 % — ABNORMAL LOW (ref 36.0–46.0)
HEMOGLOBIN: 11.7 g/dL — AB (ref 12.0–15.0)
MCH: 33.1 pg (ref 26.0–34.0)
MCHC: 32.7 g/dL (ref 30.0–36.0)
MCV: 101.1 fL — ABNORMAL HIGH (ref 78.0–100.0)
PLATELETS: 248 10*3/uL (ref 150–400)
RBC: 3.54 MIL/uL — ABNORMAL LOW (ref 3.87–5.11)
RDW: 13.2 % (ref 11.5–15.5)
WBC: 7.4 10*3/uL (ref 4.0–10.5)

## 2016-01-03 LAB — TYPE AND SCREEN
ABO/RH(D): A POS
ANTIBODY SCREEN: NEGATIVE

## 2016-01-03 LAB — GLUCOSE, CAPILLARY: Glucose-Capillary: 106 mg/dL — ABNORMAL HIGH (ref 65–99)

## 2016-01-03 LAB — SURGICAL PCR SCREEN
MRSA, PCR: NEGATIVE
STAPHYLOCOCCUS AUREUS: POSITIVE — AB

## 2016-01-03 NOTE — Pre-Procedure Instructions (Addendum)
Becky Forbes  01/03/2016      [place], [place] [id number] [place] [address] Phone: [phone] Fax: [place], [facility] [address] [place] [address] [place] [address] Phone: [phone] Fax: [phone]    Your procedure is scheduled on 01/05/16.  Report to [facility] Admitting at 530 A.M.  Call this number if you have problems the morning of surgery:  [phone]   Remember:  Do not eat food or drink liquids after midnight.  Take these medicines the morning of surgery with A SIP OF WATER amiodarone,(pacerone),bromocriptine(parodel),levothyroxine, metoprolol  STOP all herbel meds, nsaids (aleve,naproxen,advil,ibuprofen)  Today including vitamins,aspirin     How to Manage Your Diabetes Before and After Surgery  Why is it important to control my blood sugar before and after surgery? . Improving blood sugar levels before and after surgery helps healing and can limit problems. . A way of improving blood sugar control is eating a healthy diet by: o  Eating less sugar and carbohydrates o  Increasing activity/exercise o  Talking with your doctor about reaching your blood sugar goals . High blood sugars (greater than 180 mg/dL) can raise your risk of infections and slow your recovery, so you will need to focus on controlling your diabetes during the weeks before surgery. . Make sure that the doctor who takes care of your diabetes knows about your planned surgery including the date and location.  How do I manage my blood sugar before surgery? . Check your blood sugar at least 4 times a day, starting 2 days before surgery, to make sure that the level is not too high or low. o Check your blood sugar the morning of your surgery when you wake up and every 2 hours until you get to the Short Stay unit. . If your blood sugar is less than 70  mg/dL, you will need to treat for low blood sugar: o Do not take insulin. o Treat a low blood sugar (less than 70 mg/dL) with  cup of clear juice (cranberry or apple), 4 glucose tablets, OR glucose gel. o Recheck blood sugar in 15 minutes after treatment (to make sure it is greater than 70 mg/dL). If your blood sugar is not greater than 70 mg/dL on recheck, call [phone] for further instructions. . Report your blood sugar to the short stay nurse when you get to Short Stay.  . If you are admitted to the hospital after surgery: o Your blood sugar will be checked by the staff and you will probably be given insulin after surgery (instead of oral diabetes medicines) to make sure you have good blood sugar levels. o The goal for blood sugar control after surgery is 80-180 mg/dL.   WHAT DO I DO ABOUT MY DIABETES MEDICATION?  Marland Kitchen Do not take oral diabetes medicines (pills) the morning of surgery.(januvia,prandin)   Do not wear jewelry, make-up or nail polish.  Do not wear lotions, powders, or perfumes, or deoderant.  Do not shave 48 hours prior to surgery.  Men may shave face and neck.  Do not bring valuables to the hospital.  [facility] is not responsible for any belongings or valuables.  Contacts, dentures or bridgework may not be worn into surgery.  Leave your suitcase in the car.  After surgery it may be brought to your room.  For patients admitted to the hospital, discharge time  will be determined by your treatment team.  Patients discharged the day of surgery will not be allowed to drive home.   Name and phone number of your driver:   Special instructions:   Special Instructions: [place] - Preparing for Surgery  Before surgery, you can play an important role.  Because skin is not sterile, your skin needs to be as free of germs as possible.  You can reduce the number of germs on you skin by washing with CHG (chlorahexidine gluconate) soap before surgery.  CHG is an antiseptic  cleaner which kills germs and bonds with the skin to continue killing germs even after washing.  Please DO NOT use if you have an allergy to CHG or antibacterial soaps.  If your skin becomes reddened/irritated stop using the CHG and inform your nurse when you arrive at Short Stay.  Do not shave (including legs and underarms) for at least 48 hours prior to the first CHG shower.  You may shave your face.  Please follow these instructions carefully:   1.  Shower with CHG Soap the night before surgery and the morning of Surgery.  2.  If you choose to wash your hair, wash your hair first as usual with your normal shampoo.  3.  After you shampoo, rinse your hair and body thoroughly to remove the Shampoo.  4.  Use CHG as you would any other liquid soap.  You can apply chg directly  to the skin and wash gently with scrungie or a clean washcloth.  5.  Apply the CHG Soap to your body ONLY FROM THE NECK DOWN.  Do not use on open wounds or open sores.  Avoid contact with your eyes ears, mouth and genitals (private parts).  Wash genitals (private parts)       with your normal soap.  6.  Wash thoroughly, paying special attention to the area where your surgery will be performed.  7.  Thoroughly rinse your body with warm water from the neck down.  8.  DO NOT shower/wash with your normal soap after using and rinsing off the CHG Soap.  9.  Pat yourself dry with a clean towel.            10.  Wear clean pajamas.            11.  Place clean sheets on your bed the night of your first shower and do not sleep with pets.  Day of Surgery  Do not apply any lotions/deodorants the morning of surgery.  Please wear clean clothes to the hospital/surgery center.  Please read over the  fact sheets that you were given.

## 2016-01-03 NOTE — Progress Notes (Signed)
Mupirocin Ointment Rx called into [facility] on [address] for positive PCR of Staph. Pt notified and voiced understanding.  

## 2016-01-04 LAB — HEMOGLOBIN A1C
Hgb A1c MFr Bld: 7.5 % — ABNORMAL HIGH (ref 4.8–5.6)
MEAN PLASMA GLUCOSE: 169 mg/dL

## 2016-01-04 NOTE — Progress Notes (Signed)
Anesthesia chart review: Patient is a 75 year old female scheduled for exploration of lumbar fusion with decompression and fusion at L2-3 on 01/05/2016 by Dr. Vertell Limber.  History includes HTN, PAF/flutter, SSS s/p 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    She does not want staff to mention her dentures in front of her family.  PCP is Dr. Billey Gosling, last visit 06/12/15.   Endocrinologist is Dr. Renato Shin, last visit 09/12/15. Cardiologist is Dr. Mertie Moores, last visit 10/25/15. He signed a letter indicating he felt she would be low risk for surgery. EP is Dr. [name].  10/25/15 EKG: Atrial paced rhythm with prolonged AV conduction, ST and T-wave abnormality, consider inferior lateral ischemia. T wave abnormality also present on 01/04/15 tracing.  03/28/14 Echo: Study Conclusions - Left ventricle: Posterior basal hypokinesis. The cavity size was mildly dilated. Wall thickness was normal. Systolic function was normal. The estimated ejection fraction was in the range of 50% to 55%. Doppler parameters are consistent with elevated ventricular end-diastolic filling pressure. - Mitral valve: Calcified annulus. Mildly thickened leaflets . There was mild regurgitation. - Left atrium: The atrium was severely dilated. - Right atrium: The atrium was moderately dilated. - Atrial septum: No defect or patent foramen ovale was identified. - Impressions: PA pressure elevated base on TR velocity. Impressions: - PA pressure elevated base on TR velocity (TR peak velocity 339 cm/s, TR peak RV-RA gradient 46 mmHg).  05/18/15 Carotid  U/S: Impressions: Heterogeneous plaque, bilaterally. Stable right ICA stenosis, now in the 1-39% range. Left ICA velocities have decreased since prior exam, now in the 40-59% range of stenosis. Normal subclavian arteries, bilaterally. Patent vertebral arteries with antegrade flow.  Preoperative labs noted. H/H 11.7/35.8, A1c 7.5, BUN 24, Cr 1.89. (Cr 1.33-2.53 over the past year, and primarily appears in the 1.6-1.8 range.) She does not see a nephrologist--so far monitored by Dr. Quay Burow and Dr. Loanne Drilling. Her kidney function will should be monitored in the post-operative period.  If no acute changes then I anticipate that she can proceed. Chart left for nursing follow-up regarding perioperative pacemaker Rx form.   George Hugh [facility] Short Stay Center/Anesthesiology Phone [phone] 01/04/2016 11:29 AM

## 2016-01-04 NOTE — Anesthesia Preprocedure Evaluation (Addendum)
Anesthesia Evaluation  Patient identified by MRN, date of birth, ID band Patient awake    Reviewed: Allergy & Precautions, NPO status , Patient's Chart, lab work & pertinent test results  History of Anesthesia Complications (+) PONV and history of anesthetic complications  Airway Mallampati: I  TM Distance: >3 FB Neck ROM: Full    Dental  (+) Edentulous Upper   Pulmonary neg pulmonary ROS,    breath sounds clear to auscultation       Cardiovascular hypertension, + Peripheral Vascular Disease and +CHF  + pacemaker + Valvular Problems/Murmurs  Rhythm:Regular Rate:Normal     Neuro/Psych negative neurological ROS     GI/Hepatic negative GI ROS, Neg liver ROS,   Endo/Other  diabetesHypothyroidism Hyperthyroidism   Renal/GU Renal InsufficiencyRenal disease     Musculoskeletal   Abdominal (+) + obese,   Peds  Hematology   Anesthesia Other Findings   Reproductive/Obstetrics                            Anesthesia Physical Anesthesia Plan  ASA: III  Anesthesia Plan: General   Post-op Pain Management:    Induction: Intravenous  Airway Management Planned: Oral ETT  Additional Equipment:   Intra-op Plan:   Post-operative Plan: Extubation in OR  Informed Consent:   Dental advisory given  Plan Discussed with: CRNA and Surgeon  Anesthesia Plan Comments:         Anesthesia Quick Evaluation

## 2016-01-05 ENCOUNTER — Inpatient Hospital Stay (HOSPITAL_COMMUNITY): Payer: Medicare Other

## 2016-01-05 ENCOUNTER — Encounter (HOSPITAL_COMMUNITY)
Admission: RE | Disposition: A | Discharge status: Home Health | Payer: Self-pay | Source: Ambulatory Visit | Attending: Neurosurgery

## 2016-01-05 ENCOUNTER — Inpatient Hospital Stay (HOSPITAL_COMMUNITY): Payer: Medicare Other | Admitting: Anesthesiology

## 2016-01-05 ENCOUNTER — Encounter (HOSPITAL_COMMUNITY): Payer: Self-pay | Admitting: *Deleted

## 2016-01-05 ENCOUNTER — Inpatient Hospital Stay (HOSPITAL_COMMUNITY)
Admission: RE | Admit: 2016-01-05 | Discharge: 2016-01-08 | DRG: 460 | Disposition: A | Discharge status: Home Health | Payer: Medicare Other | Source: Ambulatory Visit | Attending: Neurosurgery | Admitting: Neurosurgery

## 2016-01-05 DIAGNOSIS — Z885 Allergy status to narcotic agent status: Secondary | ICD-10-CM | POA: Diagnosis not present

## 2016-01-05 DIAGNOSIS — Z981 Arthrodesis status: Secondary | ICD-10-CM | POA: Diagnosis not present

## 2016-01-05 DIAGNOSIS — E669 Obesity, unspecified: Secondary | ICD-10-CM | POA: Diagnosis not present

## 2016-01-05 DIAGNOSIS — E0590 Thyrotoxicosis, unspecified without thyrotoxic crisis or storm: Secondary | ICD-10-CM | POA: Diagnosis not present

## 2016-01-05 DIAGNOSIS — M5126 Other intervertebral disc displacement, lumbar region: Secondary | ICD-10-CM | POA: Diagnosis not present

## 2016-01-05 DIAGNOSIS — Z7984 Long term (current) use of oral hypoglycemic drugs: Secondary | ICD-10-CM | POA: Diagnosis not present

## 2016-01-05 DIAGNOSIS — I110 Hypertensive heart disease with heart failure: Secondary | ICD-10-CM | POA: Diagnosis not present

## 2016-01-05 DIAGNOSIS — I2510 Atherosclerotic heart disease of native coronary artery without angina pectoris: Secondary | ICD-10-CM | POA: Diagnosis not present

## 2016-01-05 DIAGNOSIS — E1151 Type 2 diabetes mellitus with diabetic peripheral angiopathy without gangrene: Secondary | ICD-10-CM | POA: Diagnosis present

## 2016-01-05 DIAGNOSIS — I480 Paroxysmal atrial fibrillation: Secondary | ICD-10-CM | POA: Diagnosis not present

## 2016-01-05 DIAGNOSIS — Z6833 Body mass index (BMI) 33.0-33.9, adult: Secondary | ICD-10-CM | POA: Diagnosis not present

## 2016-01-05 DIAGNOSIS — Z419 Encounter for procedure for purposes other than remedying health state, unspecified: Secondary | ICD-10-CM

## 2016-01-05 DIAGNOSIS — M4326 Fusion of spine, lumbar region: Secondary | ICD-10-CM | POA: Diagnosis not present

## 2016-01-05 DIAGNOSIS — I509 Heart failure, unspecified: Secondary | ICD-10-CM | POA: Diagnosis present

## 2016-01-05 DIAGNOSIS — M4806 Spinal stenosis, lumbar region: Principal | ICD-10-CM | POA: Diagnosis present

## 2016-01-05 DIAGNOSIS — M4316 Spondylolisthesis, lumbar region: Secondary | ICD-10-CM | POA: Diagnosis present

## 2016-01-05 DIAGNOSIS — M5116 Intervertebral disc disorders with radiculopathy, lumbar region: Secondary | ICD-10-CM | POA: Diagnosis present

## 2016-01-05 LAB — GLUCOSE, CAPILLARY
GLUCOSE-CAPILLARY: 112 mg/dL — AB (ref 65–99)
GLUCOSE-CAPILLARY: 136 mg/dL — AB (ref 65–99)
GLUCOSE-CAPILLARY: 341 mg/dL — AB (ref 65–99)
GLUCOSE-CAPILLARY: 289 mg/dL — AB (ref 65–99)

## 2016-01-05 SURGERY — POSTERIOR LUMBAR FUSION 1 LEVEL
Anesthesia: General | Site: Back

## 2016-01-05 MED ORDER — ONDANSETRON HCL 4 MG/2ML IJ SOLN
INTRAMUSCULAR | Status: AC
  Filled 2016-01-05: qty 2

## 2016-01-05 MED ORDER — SODIUM CHLORIDE 0.9% FLUSH
3.0000 mL | Freq: Two times a day (BID) | INTRAVENOUS | Status: DC
  Administered 2016-01-05 – 2016-01-08 (×7): 3 mL via INTRAVENOUS

## 2016-01-05 MED ORDER — KCL IN DEXTROSE-NACL 20-5-0.45 MEQ/L-%-% IV SOLN
INTRAVENOUS | Status: DC
  Administered 2016-01-05: 18:00:00 via INTRAVENOUS
  Filled 2016-01-05 (×2): qty 1000

## 2016-01-05 MED ORDER — RAMIPRIL 5 MG PO CAPS
5.0000 mg | ORAL_CAPSULE | Freq: Two times a day (BID) | ORAL | Status: DC
  Administered 2016-01-05 – 2016-01-07 (×5): 5 mg via ORAL
  Filled 2016-01-05 (×6): qty 1

## 2016-01-05 MED ORDER — AMIODARONE HCL 200 MG PO TABS
200.0000 mg | ORAL_TABLET | Freq: Every day | ORAL | Status: DC
  Administered 2016-01-06 – 2016-01-08 (×3): 200 mg via ORAL
  Filled 2016-01-05 (×3): qty 1

## 2016-01-05 MED ORDER — DOCUSATE SODIUM 100 MG PO CAPS
100.0000 mg | ORAL_CAPSULE | Freq: Two times a day (BID) | ORAL | Status: DC
  Administered 2016-01-05 – 2016-01-08 (×7): 100 mg via ORAL
  Filled 2016-01-05 (×7): qty 1

## 2016-01-05 MED ORDER — MIDAZOLAM HCL 5 MG/5ML IJ SOLN
INTRAMUSCULAR | Status: DC | PRN
Start: 2016-01-05 — End: 2016-01-05
  Administered 2016-01-05: 1 mg via INTRAVENOUS

## 2016-01-05 MED ORDER — 0.9 % SODIUM CHLORIDE (POUR BTL) OPTIME
TOPICAL | Status: DC | PRN
  Administered 2016-01-05: 1000 mL

## 2016-01-05 MED ORDER — PHENYLEPHRINE 40 MCG/ML (10ML) SYRINGE FOR IV PUSH (FOR BLOOD PRESSURE SUPPORT)
PREFILLED_SYRINGE | INTRAVENOUS | Status: DC | PRN
  Administered 2016-01-05 (×3): 80 ug via INTRAVENOUS

## 2016-01-05 MED ORDER — METHOCARBAMOL 1000 MG/10ML IJ SOLN
500.0000 mg | Freq: Four times a day (QID) | INTRAMUSCULAR | Status: DC | PRN
  Filled 2016-01-05: qty 5

## 2016-01-05 MED ORDER — HYDROMORPHONE HCL 1 MG/ML IJ SOLN
INTRAMUSCULAR | Status: AC
  Filled 2016-01-05: qty 1

## 2016-01-05 MED ORDER — SUFENTANIL CITRATE 50 MCG/ML IV SOLN
INTRAVENOUS | Status: DC | PRN
  Administered 2016-01-05: 5 ug via INTRAVENOUS
  Administered 2016-01-05: 10 ug via INTRAVENOUS
  Administered 2016-01-05 (×2): 5 ug via INTRAVENOUS

## 2016-01-05 MED ORDER — LACTATED RINGERS IV SOLN
INTRAVENOUS | Status: DC | PRN
  Administered 2016-01-05: 08:00:00 via INTRAVENOUS

## 2016-01-05 MED ORDER — REPAGLINIDE 2 MG PO TABS
2.0000 mg | ORAL_TABLET | Freq: Two times a day (BID) | ORAL | Status: DC
  Administered 2016-01-05 – 2016-01-08 (×6): 2 mg via ORAL
  Filled 2016-01-05 (×6): qty 1

## 2016-01-05 MED ORDER — CHLORHEXIDINE GLUCONATE CLOTH 2 % EX PADS: 6.0000 | MEDICATED_PAD | Freq: Once | CUTANEOUS | Status: DC

## 2016-01-05 MED ORDER — LIDOCAINE-EPINEPHRINE 1 %-1:100000 IJ SOLN
INTRAMUSCULAR | Status: DC | PRN
  Administered 2016-01-05: 10 mL

## 2016-01-05 MED ORDER — ACETAMINOPHEN 650 MG RE SUPP: 650.0000 mg | RECTAL | Status: DC | PRN

## 2016-01-05 MED ORDER — PROMETHAZINE HCL 25 MG/ML IJ SOLN
INTRAMUSCULAR | Status: AC
  Filled 2016-01-05: qty 1

## 2016-01-05 MED ORDER — OXYCODONE-ACETAMINOPHEN 5-325 MG PO TABS: 1.0000 | ORAL_TABLET | ORAL | Status: DC | PRN

## 2016-01-05 MED ORDER — DEXAMETHASONE SODIUM PHOSPHATE 10 MG/ML IJ SOLN
INTRAMUSCULAR | Status: DC | PRN
  Administered 2016-01-05: 4 mg via INTRAVENOUS

## 2016-01-05 MED ORDER — BUPIVACAINE LIPOSOME 1.3 % IJ SUSP
INTRAMUSCULAR | Status: DC | PRN
  Administered 2016-01-05: 20 mL

## 2016-01-05 MED ORDER — CHOLECALCIFEROL 10 MCG (400 UNIT) PO TABS
800.0000 [IU] | ORAL_TABLET | Freq: Every day | ORAL | Status: DC
  Administered 2016-01-06 – 2016-01-08 (×3): 800 [IU] via ORAL
  Filled 2016-01-05 (×3): qty 2

## 2016-01-05 MED ORDER — PROPOFOL 10 MG/ML IV BOLUS
INTRAVENOUS | Status: AC
  Filled 2016-01-05: qty 20

## 2016-01-05 MED ORDER — ATORVASTATIN CALCIUM 10 MG PO TABS
20.0000 mg | ORAL_TABLET | Freq: Every day | ORAL | Status: DC
  Administered 2016-01-05 – 2016-01-07 (×3): 20 mg via ORAL
  Filled 2016-01-05 (×3): qty 2

## 2016-01-05 MED ORDER — LINAGLIPTIN 5 MG PO TABS
5.0000 mg | ORAL_TABLET | Freq: Every day | ORAL | Status: DC
  Administered 2016-01-05 – 2016-01-08 (×4): 5 mg via ORAL
  Filled 2016-01-05 (×4): qty 1

## 2016-01-05 MED ORDER — VANCOMYCIN HCL IN DEXTROSE 750-5 MG/150ML-% IV SOLN
750.0000 mg | INTRAVENOUS | Status: DC
  Administered 2016-01-06 – 2016-01-07 (×2): 750 mg via INTRAVENOUS
  Filled 2016-01-05 (×3): qty 150

## 2016-01-05 MED ORDER — BROMOCRIPTINE MESYLATE 2.5 MG PO TABS
2.5000 mg | ORAL_TABLET | Freq: Every day | ORAL | Status: DC
  Administered 2016-01-05 – 2016-01-08 (×4): 2.5 mg via ORAL
  Filled 2016-01-05 (×4): qty 1

## 2016-01-05 MED ORDER — ROCURONIUM BROMIDE 10 MG/ML (PF) SYRINGE
PREFILLED_SYRINGE | INTRAVENOUS | Status: DC | PRN
  Administered 2016-01-05: 40 mg via INTRAVENOUS

## 2016-01-05 MED ORDER — VANCOMYCIN HCL IN DEXTROSE 1-5 GM/200ML-% IV SOLN
1000.0000 mg | INTRAVENOUS | Status: AC
  Administered 2016-01-05: 1000 mg via INTRAVENOUS
  Filled 2016-01-05: qty 200

## 2016-01-05 MED ORDER — LIDOCAINE 2% (20 MG/ML) 5 ML SYRINGE
INTRAMUSCULAR | Status: DC | PRN
  Administered 2016-01-05: 80 mg via INTRAVENOUS

## 2016-01-05 MED ORDER — SUGAMMADEX SODIUM 200 MG/2ML IV SOLN
INTRAVENOUS | Status: DC | PRN
  Administered 2016-01-05: 100 mg via INTRAVENOUS

## 2016-01-05 MED ORDER — SENNOSIDES-DOCUSATE SODIUM 8.6-50 MG PO TABS: 1.0000 | ORAL_TABLET | Freq: Every evening | ORAL | Status: DC | PRN

## 2016-01-05 MED ORDER — BUPIVACAINE HCL (PF) 0.5 % IJ SOLN
INTRAMUSCULAR | Status: DC | PRN
  Administered 2016-01-05: 10 mL

## 2016-01-05 MED ORDER — ROCURONIUM BROMIDE 10 MG/ML (PF) SYRINGE
PREFILLED_SYRINGE | INTRAVENOUS | Status: AC
  Filled 2016-01-05: qty 10

## 2016-01-05 MED ORDER — ONDANSETRON HCL 4 MG/2ML IJ SOLN
INTRAMUSCULAR | Status: DC | PRN
  Administered 2016-01-05: 4 mg via INTRAVENOUS

## 2016-01-05 MED ORDER — HYDROMORPHONE HCL 1 MG/ML IJ SOLN
0.2500 mg | INTRAMUSCULAR | Status: DC | PRN
  Administered 2016-01-05 (×2): 0.5 mg via INTRAVENOUS

## 2016-01-05 MED ORDER — THROMBIN 20000 UNITS EX SOLR
CUTANEOUS | Status: DC | PRN
  Administered 2016-01-05: 09:00:00 via TOPICAL

## 2016-01-05 MED ORDER — PROPOFOL 10 MG/ML IV BOLUS
INTRAVENOUS | Status: DC | PRN
  Administered 2016-01-05: 120 mg via INTRAVENOUS

## 2016-01-05 MED ORDER — MENTHOL 3 MG MT LOZG: 1.0000 | LOZENGE | OROMUCOSAL | Status: DC | PRN

## 2016-01-05 MED ORDER — FUROSEMIDE 20 MG PO TABS: 20.0000 mg | ORAL_TABLET | Freq: Every day | ORAL | Status: DC | PRN

## 2016-01-05 MED ORDER — ACETAMINOPHEN 325 MG PO TABS: 650.0000 mg | ORAL_TABLET | Freq: Four times a day (QID) | ORAL | Status: DC | PRN

## 2016-01-05 MED ORDER — HYDROMORPHONE HCL 1 MG/ML IJ SOLN: 0.5000 mg | INTRAMUSCULAR | Status: DC | PRN

## 2016-01-05 MED ORDER — FAMOTIDINE IN NACL 20-0.9 MG/50ML-% IV SOLN
20.0000 mg | INTRAVENOUS | Status: DC
  Administered 2016-01-05: 20 mg via INTRAVENOUS
  Filled 2016-01-05: qty 50

## 2016-01-05 MED ORDER — ZOLPIDEM TARTRATE 5 MG PO TABS: 5.0000 mg | ORAL_TABLET | Freq: Every evening | ORAL | Status: DC | PRN

## 2016-01-05 MED ORDER — MIDAZOLAM HCL 2 MG/2ML IJ SOLN
INTRAMUSCULAR | Status: AC
  Filled 2016-01-05: qty 2

## 2016-01-05 MED ORDER — ONDANSETRON HCL 4 MG/2ML IJ SOLN
4.0000 mg | INTRAMUSCULAR | Status: DC | PRN
  Administered 2016-01-06 (×2): 4 mg via INTRAVENOUS
  Filled 2016-01-05 (×2): qty 2

## 2016-01-05 MED ORDER — SODIUM CHLORIDE 0.9 % IV SOLN: 250.0000 mL | INTRAVENOUS | Status: DC

## 2016-01-05 MED ORDER — DEXTROSE 5 % IV SOLN
INTRAVENOUS | Status: DC | PRN
  Administered 2016-01-05: 50 ug/min via INTRAVENOUS

## 2016-01-05 MED ORDER — METOPROLOL TARTRATE 50 MG PO TABS
50.0000 mg | ORAL_TABLET | Freq: Two times a day (BID) | ORAL | Status: DC
  Administered 2016-01-05 – 2016-01-08 (×6): 50 mg via ORAL
  Filled 2016-01-05 (×6): qty 1

## 2016-01-05 MED ORDER — ACETAMINOPHEN 325 MG PO TABS
650.0000 mg | ORAL_TABLET | ORAL | Status: DC | PRN
  Administered 2016-01-08: 650 mg via ORAL
  Filled 2016-01-05: qty 2

## 2016-01-05 MED ORDER — SUFENTANIL CITRATE 50 MCG/ML IV SOLN
INTRAVENOUS | Status: AC
  Filled 2016-01-05: qty 1

## 2016-01-05 MED ORDER — FLEET ENEMA 7-19 GM/118ML RE ENEM: 1.0000 | ENEMA | Freq: Once | RECTAL | Status: DC | PRN

## 2016-01-05 MED ORDER — ALUM & MAG HYDROXIDE-SIMETH 200-200-20 MG/5ML PO SUSP: 30.0000 mL | Freq: Four times a day (QID) | ORAL | Status: DC | PRN

## 2016-01-05 MED ORDER — SODIUM CHLORIDE 0.9% FLUSH: 3.0000 mL | INTRAVENOUS | Status: DC | PRN

## 2016-01-05 MED ORDER — INSULIN ASPART 100 UNIT/ML ~~LOC~~ SOLN
0.0000 [IU] | Freq: Three times a day (TID) | SUBCUTANEOUS | Status: DC
  Administered 2016-01-06 (×2): 3 [IU] via SUBCUTANEOUS
  Administered 2016-01-06: 5 [IU] via SUBCUTANEOUS
  Administered 2016-01-07: 2 [IU] via SUBCUTANEOUS
  Administered 2016-01-07: 3 [IU] via SUBCUTANEOUS

## 2016-01-05 MED ORDER — LEVOTHYROXINE SODIUM 88 MCG PO TABS
88.0000 ug | ORAL_TABLET | Freq: Every day | ORAL | Status: DC
  Administered 2016-01-06 – 2016-01-08 (×3): 88 ug via ORAL
  Filled 2016-01-05 (×3): qty 1

## 2016-01-05 MED ORDER — ADULT MULTIVITAMIN W/MINERALS CH
1.0000 | ORAL_TABLET | Freq: Every day | ORAL | Status: DC
  Administered 2016-01-05 – 2016-01-08 (×4): 1 via ORAL
  Filled 2016-01-05 (×4): qty 1

## 2016-01-05 MED ORDER — METHOCARBAMOL 500 MG PO TABS
500.0000 mg | ORAL_TABLET | Freq: Four times a day (QID) | ORAL | Status: DC | PRN
  Administered 2016-01-05 – 2016-01-08 (×8): 500 mg via ORAL
  Filled 2016-01-05 (×8): qty 1

## 2016-01-05 MED ORDER — SODIUM CHLORIDE 0.9 % IJ SOLN
INTRAMUSCULAR | Status: AC
  Filled 2016-01-05: qty 20

## 2016-01-05 MED ORDER — MEPERIDINE HCL 25 MG/ML IJ SOLN: 6.2500 mg | INTRAMUSCULAR | Status: DC | PRN

## 2016-01-05 MED ORDER — DEXAMETHASONE SODIUM PHOSPHATE 10 MG/ML IJ SOLN
INTRAMUSCULAR | Status: AC
  Filled 2016-01-05: qty 1

## 2016-01-05 MED ORDER — BISACODYL 10 MG RE SUPP: 10.0000 mg | Freq: Every day | RECTAL | Status: DC | PRN

## 2016-01-05 MED ORDER — LIDOCAINE 2% (20 MG/ML) 5 ML SYRINGE
INTRAMUSCULAR | Status: AC
  Filled 2016-01-05: qty 5

## 2016-01-05 MED ORDER — HYDROMORPHONE HCL 2 MG PO TABS
2.0000 mg | ORAL_TABLET | ORAL | Status: DC | PRN
  Administered 2016-01-05 – 2016-01-07 (×10): 2 mg via ORAL
  Filled 2016-01-05 (×10): qty 1

## 2016-01-05 MED ORDER — PHENOL 1.4 % MT LIQD
1.0000 | OROMUCOSAL | Status: DC | PRN
  Administered 2016-01-07: 1 via OROMUCOSAL
  Filled 2016-01-05: qty 177

## 2016-01-05 MED ORDER — BUPIVACAINE LIPOSOME 1.3 % IJ SUSP
20.0000 mL | Freq: Once | INTRAMUSCULAR | Status: DC
  Filled 2016-01-05: qty 20

## 2016-01-05 MED ORDER — SUGAMMADEX SODIUM 200 MG/2ML IV SOLN
INTRAVENOUS | Status: AC
  Filled 2016-01-05: qty 2

## 2016-01-05 SURGICAL SUPPLY — 74 items
ADH SKN CLS APL DERMABOND .7 (GAUZE/BANDAGES/DRESSINGS) ×1
BLADE CLIPPER SURG (BLADE) IMPLANT
BONE CANC CHIPS 20CC PCAN1/4 (Bone Implant) ×3 IMPLANT
BONE MATRIX OSTEOCEL PRO MED (Bone Implant) ×2 IMPLANT
BUR MATCHSTICK NEURO 3.0 LAGG (BURR) ×3 IMPLANT
BUR PRECISION FLUTE 5.0 (BURR) ×3 IMPLANT
CANISTER SUCT 3000ML PPV (MISCELLANEOUS) ×3 IMPLANT
CHIPS CANC BONE 20CC PCAN1/4 (Bone Implant) ×1 IMPLANT
CONNECTOR RELINE ROTATE 5-6MM (Connector) ×4 IMPLANT
CONT SPEC 4OZ CLIKSEAL STRL BL (MISCELLANEOUS) ×3 IMPLANT
COVER BACK TABLE 60X90IN (DRAPES) ×3 IMPLANT
DECANTER SPIKE VIAL GLASS SM (MISCELLANEOUS) ×3 IMPLANT
DERMABOND ADVANCED (GAUZE/BANDAGES/DRESSINGS) ×2
DERMABOND ADVANCED .7 DNX12 (GAUZE/BANDAGES/DRESSINGS) ×1 IMPLANT
DRAPE C-ARM 42X72 X-RAY (DRAPES) ×6 IMPLANT
DRAPE C-ARMOR (DRAPES) ×2 IMPLANT
DRAPE LAPAROTOMY 100X72X124 (DRAPES) ×3 IMPLANT
DRAPE POUCH INSTRU U-SHP 10X18 (DRAPES) ×3 IMPLANT
DRAPE SURG 17X23 STRL (DRAPES) ×3 IMPLANT
DRSG OPSITE POSTOP 4X8 (GAUZE/BANDAGES/DRESSINGS) ×2 IMPLANT
DURAPREP 26ML APPLICATOR (WOUND CARE) ×3 IMPLANT
ELECT REM PT RETURN 9FT ADLT (ELECTROSURGICAL) ×3
ELECTRODE REM PT RTRN 9FT ADLT (ELECTROSURGICAL) ×1 IMPLANT
EVACUATOR 1/8 PVC DRAIN (DRAIN) ×3 IMPLANT
GAUZE SPONGE 4X4 12PLY STRL (GAUZE/BANDAGES/DRESSINGS) ×3 IMPLANT
GAUZE SPONGE 4X4 16PLY XRAY LF (GAUZE/BANDAGES/DRESSINGS) IMPLANT
GLOVE BIO SURGEON STRL SZ8 (GLOVE) ×6 IMPLANT
GLOVE BIOGEL PI IND STRL 8 (GLOVE) ×2 IMPLANT
GLOVE BIOGEL PI IND STRL 8.5 (GLOVE) ×2 IMPLANT
GLOVE BIOGEL PI INDICATOR 8 (GLOVE) ×4
GLOVE BIOGEL PI INDICATOR 8.5 (GLOVE) ×4
GLOVE ECLIPSE 8.0 STRL XLNG CF (GLOVE) ×6 IMPLANT
GLOVE EXAM NITRILE LRG STRL (GLOVE) IMPLANT
GLOVE EXAM NITRILE XL STR (GLOVE) IMPLANT
GLOVE EXAM NITRILE XS STR PU (GLOVE) IMPLANT
GOWN STRL REUS W/ TWL LRG LVL3 (GOWN DISPOSABLE) IMPLANT
GOWN STRL REUS W/ TWL XL LVL3 (GOWN DISPOSABLE) ×2 IMPLANT
GOWN STRL REUS W/TWL 2XL LVL3 (GOWN DISPOSABLE) ×6 IMPLANT
GOWN STRL REUS W/TWL LRG LVL3 (GOWN DISPOSABLE)
GOWN STRL REUS W/TWL XL LVL3 (GOWN DISPOSABLE) ×6
KIT BASIN OR (CUSTOM PROCEDURE TRAY) ×3 IMPLANT
KIT POSITION SURG JACKSON T1 (MISCELLANEOUS) ×3 IMPLANT
KIT ROOM TURNOVER OR (KITS) ×3 IMPLANT
NDL HYPO 25X1 1.5 SAFETY (NEEDLE) ×1 IMPLANT
NDL SPNL 18GX3.5 QUINCKE PK (NEEDLE) IMPLANT
NEEDLE HYPO 25X1 1.5 SAFETY (NEEDLE) ×3 IMPLANT
NEEDLE SPNL 18GX3.5 QUINCKE PK (NEEDLE) IMPLANT
NS IRRIG 1000ML POUR BTL (IV SOLUTION) ×3 IMPLANT
PACK LAMINECTOMY NEURO (CUSTOM PROCEDURE TRAY) ×3 IMPLANT
PAD ARMBOARD 7.5X6 YLW CONV (MISCELLANEOUS) ×9 IMPLANT
PATTIES SURGICAL .5 X.5 (GAUZE/BANDAGES/DRESSINGS) IMPLANT
PATTIES SURGICAL .5 X1 (DISPOSABLE) IMPLANT
PATTIES SURGICAL 1X1 (DISPOSABLE) ×2 IMPLANT
ROD RELINE TI LORD 5.5X70 (Rod) ×2 IMPLANT
ROD RELINE-O LORDOTIC 5.5X60MM (Rod) ×2 IMPLANT
SCREW LOCK RELINE 5.5 TULIP (Screw) ×6 IMPLANT
SCREW LOCK RELINE CLOSED TULIP (Screw) ×2 IMPLANT
SCREW RELINE-O POLY 5.5X45MM (Screw) ×2 IMPLANT
SCREW RELINE-O POLY 6.5X45 (Screw) ×3 IMPLANT
SPONGE LAP 4X18 X RAY DECT (DISPOSABLE) IMPLANT
SPONGE SURGIFOAM ABS GEL 100 (HEMOSTASIS) ×3 IMPLANT
STAPLER SKIN PROX WIDE 3.9 (STAPLE) IMPLANT
SUT VIC AB 1 CT1 18XBRD ANBCTR (SUTURE) ×2 IMPLANT
SUT VIC AB 1 CT1 8-18 (SUTURE) ×6
SUT VIC AB 2-0 CT1 18 (SUTURE) ×3 IMPLANT
SUT VIC AB 3-0 SH 8-18 (SUTURE) ×6 IMPLANT
SYR 20CC LL (SYRINGE) ×3 IMPLANT
SYR 3ML LL SCALE MARK (SYRINGE) ×2 IMPLANT
SYR 5ML LL (SYRINGE) IMPLANT
TOWEL OR 17X24 6PK STRL BLUE (TOWEL DISPOSABLE) ×3 IMPLANT
TOWEL OR 17X26 10 PK STRL BLUE (TOWEL DISPOSABLE) ×3 IMPLANT
TRAP SPECIMEN MUCOUS 40CC (MISCELLANEOUS) ×3 IMPLANT
TRAY FOLEY W/METER SILVER 16FR (SET/KITS/TRAYS/PACK) ×3 IMPLANT
WATER STERILE IRR 1000ML POUR (IV SOLUTION) ×3 IMPLANT

## 2016-01-05 NOTE — Anesthesia Postprocedure Evaluation (Signed)
Anesthesia Post Note  Patient: [name]  Procedure(s) Performed: Procedure(s) (LRB): Exploration of lumbar fusion with decompression and fusion at Lumbar two- three (N/A)  Patient location during evaluation: PACU Anesthesia Type: General Level of consciousness: awake and sedated Pain management: pain level controlled Vital Signs Assessment: post-procedure vital signs reviewed and stable Respiratory status: spontaneous breathing, nonlabored ventilation, respiratory function stable and patient connected to nasal cannula oxygen Cardiovascular status: blood pressure returned to baseline and stable Postop Assessment: no signs of nausea or vomiting Anesthetic complications: no    Last Vitals:  Vitals:   01/05/16 0645 01/05/16 1057  BP: (!) 181/59   Pulse: 60   Resp: 18   Temp: 36.4 C 36.3 C    Last Pain:  Vitals:   01/05/16 1057  TempSrc:   PainSc: Asleep                 Sina Sumpter,JAMES TERRILL

## 2016-01-05 NOTE — Op Note (Signed)
01/05/2016  11:06 AM  PATIENT:  Becky Forbes  75 y.o. female  PRE-OPERATIVE DIAGNOSIS:  Disc displacement, Lumbar with stenosis, spondylolisthesis, radiculopathy L 23 level, lumbago  POST-OPERATIVE DIAGNOSIS:  Disc displacement, Lumbar with stenosis, spondylolisthesis, radiculopathy L 23 level, lumbago  PROCEDURE:  Procedure(s) with comments: Exploration of lumbar fusion with decompression and fusion at Lumbar two- three (N/A) - Exploration of lumbar fusion with decompression and fusion at L2-3 with pedicle screw fixation and posterolateral arthrodesis  SURGEON:  Surgeon(s) and Role:    * Erline Levine, MD - Primary    * Ashok Pall, MD - Assisting  PHYSICIAN ASSISTANT:   ASSISTANTS: P[place], RN   ANESTHESIA:   general  EBL:  Total I/O In: 900 [I.V.:900] Out: 725 [Urine:600; Blood:125]  BLOOD ADMINISTERED:none  DRAINS: (Medium) Hemovact drain(s) in the epidural space with  Suction Open   LOCAL MEDICATIONS USED:  MARCAINE    and LIDOCAINE   SPECIMEN:  No Specimen  DISPOSITION OF SPECIMEN:  N/A  COUNTS:  YES  TOURNIQUET:  * No tourniquets in log *  DICTATION: Patient is 75 year old man with lumbar stenosis, spondylolisthesis, herniated lumbar disc and previous fusion L 3-S1 levels  with severe spinal stenosis and a long history of severe back and bilateral leg pain.  It was elected to take her to surgery for exploration of previous fusion with decompression and fusion from L 2-3 levels.   Procedure: Patient was placed in a prone position on the [place] table after smooth and uncomplicated induction of general endotracheal anesthesia. His low back was prepped and draped in usual sterile fashion with betadine scrub and DuraPrep. Area of incision was infiltrated with local lidocaine. Incision was made to the lumbodorsal fascia was incised and exposure was performed of the L 2 - L 3 spinous processes laminae facet joint and transverse processes. Previous hardware was exposed.  This  was covered with dense bone growth and required a chisel to remove the bone to expose the previously placed hardware. The bone scres were removed and appeared to be solidly in bone and there was dense bridging bone across the L 34 level.  Side connectors were affixed to the rods bilaterally at L  3 - L 4 levels. A total laminectomy of L 2 through L 3  levels was performed with disarticulation of the facet joints and thorough decompression was performed of both L 2 , L 3,  L 4  nerve roots along with the common dural tube. Decompression was greater than would be typical for PLIF. Because the bones were soft, I elected to not place  Interbody grafts at the L 23 level.   The posterolateral region was extensively decorticated and pedicle probes were placed at L 2 bilaterally. Intraoperative fluoroscopy confirmed correct orientationin the AP and lateral plane. 45 x 6.5 mm pedicle screw was placed at L 2 on the left and 45 x 5.5 mm on the right.  Final x-rays demonstrated well-positioned interbody grafts and pedicle screw fixation. A 60 mm lordotic rod was placed on the right and a 70 mm rod was placed on the left locked down in situ and the posterolateral region was packed with 20 cc bone graft on the right and  A similar amount on the left. The wound was irrigated. A medium Hemovac drain was placed. Fascia was closed with 1 Vicryl sutures skin edges were reapproximated 2 and 3-0 Vicryl sutures. 20 cc of Long-acting marcaine was injected into the deep musculature.The wound was dressed with  Dermabond and  an occlusive dressing the patient was extubated in the operating room and taken to recovery in stable satisfactory condition she tolerated traction well counts were correct at the end of the case.   PLAN OF CARE: Admit to inpatient   PATIENT DISPOSITION:  PACU - hemodynamically stable.   Delay start of Pharmacological VTE agent (>24hrs) due to surgical blood loss or risk of bleeding: yes

## 2016-01-05 NOTE — Progress Notes (Signed)
Awake, alert, conversant.  MAEW with good power.  Doing well.  

## 2016-01-05 NOTE — Progress Notes (Signed)
Pharmacy Antibiotic Note  Becky Forbes is a 75 y.o. female admitted on 01/05/2016 for a lumbar fusion and decompression.   Pharmacy has been consulted for vancomyc dosing for surgical prophylaxis while drain in place. Vanc 1 gm given preop at 0746 am, Wt 88 kg; creat 1.89, AF, WBC WNL , creat cl < 30 ml/min  Plan: Vancomycin 750 IV every 24 hours.  Goal trough 10-15 mcg/mL.  Vancomycin to be stopped once drain removed.  Do not anticipate vancomycin levels will be needed.  Height: 5\' 4"  (162.6 cm) Weight: 194 lb 0.1 oz (88 kg) IBW/kg (Calculated) : 54.7  Temp (24hrs), Avg:97.6 F (36.4 C), Min:97.3 F (36.3 C), Max:97.9 F (36.6 C)   Recent Labs Lab 01/03/16 1456  WBC 7.4  CREATININE 1.89*    Estimated Creatinine Clearance: 27.6 mL/min (by C-G formula based on SCr of 1.89 mg/dL (H)).    Allergies  Allergen Reactions  . Morphine And Related Itching, Nausea And Vomiting, Rash and Other (See Comments)    01/04/15- patient says she is NOT allergic to morphine.  Morphine IV given this AM 01/04/15 in the ED. No adverse reaction.  Celesta Gentile [Sitagliptin Phosphate] Other (See Comments)    Pt says this caused elevated liver enzymes and creatinine. IS TAKING 1/2 TABLET  . Actos [Pioglitazone Hydrochloride] Swelling    UNSPECIFIED REACTION   . Hydrocodone-Acetaminophen Itching, Nausea And Vomiting and Other (See Comments)    Per pt 05/12/15 she had this recently in the hospital with no issues at all  . Keflex [Cephalexin] Rash and Other (See Comments)    Burning sensation all over  . Percocet [Oxycodone-Acetaminophen] Nausea And Vomiting   Thank you for allowing pharmacy to be a part of this patient's care.  Eudelia Bunch, Pharm.D. QP:[phone] 01/05/2016 1:39 PM

## 2016-01-05 NOTE — Transfer of Care (Signed)
Immediate Anesthesia Transfer of Care Note  Patient: Becky Forbes  Procedure(s) Performed: Procedure(s) with comments: Exploration of lumbar fusion with decompression and fusion at Lumbar two- three (N/A) - Exploration of lumbar fusion with decompression and fusion at L2-3  Patient Location: PACU  Anesthesia Type:General  Level of Consciousness: awake, oriented and patient cooperative  Airway & Oxygen Therapy: Patient Spontanous Breathing and Patient connected to nasal cannula oxygen  Post-op Assessment: Report given to RN and Post -op Vital signs reviewed and stable  Post vital signs: Reviewed and stable  Last Vitals:  Vitals:   01/05/16 0645  BP: (!) 181/59  Pulse: 60  Resp: 18  Temp: 36.4 C    Last Pain:  Vitals:   01/05/16 0723  TempSrc:   PainSc: 3       Patients Stated Pain Goal: 3 ([id number])  Complications: No apparent anesthesia complications

## 2016-01-05 NOTE — Anesthesia Procedure Notes (Signed)
Procedure Name: Intubation Date/Time: 01/05/2016 7:55 AM Performed by: Melina Copa, Alban Marucci R Pre-anesthesia Checklist: Patient identified, Emergency Drugs available, Suction available and Patient being monitored Patient Re-evaluated:Patient Re-evaluated prior to inductionOxygen Delivery Method: Circle System Utilized Preoxygenation: Pre-oxygenation with 100% oxygen Intubation Type: IV induction Ventilation: Mask ventilation without difficulty Laryngoscope Size: Mac and 3 Grade View: Grade I Tube type: Oral Tube size: 7.5 mm Number of attempts: 1 Airway Equipment and Method: Stylet Placement Confirmation: ETT inserted through vocal cords under direct vision,  positive ETCO2 and breath sounds checked- equal and bilateral Secured at: 19 cm Tube secured with: Tape Dental Injury: Teeth and Oropharynx as per pre-operative assessment

## 2016-01-05 NOTE — Interval H&P Note (Signed)
History and Physical Interval Note:  01/05/2016 7:34 AM  Becky Forbes  has presented today for surgery, with the diagnosis of Disc displacement, Lumbar  The various methods of treatment have been discussed with the patient and family. After consideration of risks, benefits and other options for treatment, the patient has consented to  Procedure(s) with comments: Exploration of lumbar fusion with decompression and fusion at L2-3 (N/A) - Exploration of lumbar fusion with decompression and fusion at L2-3 as a surgical intervention .  The patient's history has been reviewed, patient examined, no change in status, stable for surgery.  I have reviewed the patient's chart and labs.  Questions were answered to the patient's satisfaction.     Laverne Klugh D

## 2016-01-05 NOTE — Brief Op Note (Signed)
01/05/2016  11:06 AM  PATIENT:  Becky Forbes  75 y.o. female  PRE-OPERATIVE DIAGNOSIS:  Disc displacement, Lumbar with stenosis, spondylolisthesis, radiculopathy L 23 level, lumbago  POST-OPERATIVE DIAGNOSIS:  Disc displacement, Lumbar with stenosis, spondylolisthesis, radiculopathy L 23 level, lumbago  PROCEDURE:  Procedure(s) with comments: Exploration of lumbar fusion with decompression and fusion at Lumbar two- three (N/A) - Exploration of lumbar fusion with decompression and fusion at L2-3 with pedicle screw fixation and posterolateral arthrodesis  SURGEON:  Surgeon(s) and Role:    * Erline Levine, MD - Primary    * Ashok Pall, MD - Assisting  PHYSICIAN ASSISTANT:   ASSISTANTS: Poteat, RN   ANESTHESIA:   general  EBL:  Total I/O In: 900 [I.V.:900] Out: 725 [Urine:600; Blood:125]  BLOOD ADMINISTERED:none  DRAINS: (Medium) Hemovact drain(s) in the epidural space with  Suction Open   LOCAL MEDICATIONS USED:  MARCAINE    and LIDOCAINE   SPECIMEN:  No Specimen  DISPOSITION OF SPECIMEN:  N/A  COUNTS:  YES  TOURNIQUET:  * No tourniquets in log *  DICTATION: Patient is 75 year old man with lumbar stenosis, spondylolisthesis, herniated lumbar disc and previous fusion L 3-S1 levels  with severe spinal stenosis and a long history of severe back and bilateral leg pain.  It was elected to take her to surgery for exploration of previous fusion with decompression and fusion from L 2-3 levels.   Procedure: Patient was placed in a prone position on the [place] table after smooth and uncomplicated induction of general endotracheal anesthesia. His low back was prepped and draped in usual sterile fashion with betadine scrub and DuraPrep. Area of incision was infiltrated with local lidocaine. Incision was made to the lumbodorsal fascia was incised and exposure was performed of the L 2 - L 3 spinous processes laminae facet joint and transverse processes. Previous hardware was exposed.  This  was covered with dense bone growth and required a chisel to remove the bone to expose the previously placed hardware. The bone scres were removed and appeared to be solidly in bone and there was dense bridging bone across the L 34 level.  Side connectors were affixed to the rods bilaterally at L  3 - L 4 levels. A total laminectomy of L 2 through L 3  levels was performed with disarticulation of the facet joints and thorough decompression was performed of both L 2 , L 3,  L 4  nerve roots along with the common dural tube. Decompression was greater than would be typical for PLIF. Because the bones were soft, I elected to not place  Interbody grafts at the L 23 level.   The posterolateral region was extensively decorticated and pedicle probes were placed at L 2 bilaterally. Intraoperative fluoroscopy confirmed correct orientationin the AP and lateral plane. 45 x 6.5 mm pedicle screw was placed at L 2 on the left and 45 x 5.5 mm on the right.  Final x-rays demonstrated well-positioned interbody grafts and pedicle screw fixation. A 60 mm lordotic rod was placed on the right and a 70 mm rod was placed on the left locked down in situ and the posterolateral region was packed with 20 cc bone graft on the right and  A similar amount on the left. The wound was irrigated. A medium Hemovac drain was placed. Fascia was closed with 1 Vicryl sutures skin edges were reapproximated 2 and 3-0 Vicryl sutures. 20 cc of Long-acting marcaine was injected into the deep musculature.The wound was dressed with  Dermabond and  an occlusive dressing the patient was extubated in the operating room and taken to recovery in stable satisfactory condition she tolerated traction well counts were correct at the end of the case.   PLAN OF CARE: Admit to inpatient   PATIENT DISPOSITION:  PACU - hemodynamically stable.   Delay start of Pharmacological VTE agent (>24hrs) due to surgical blood loss or risk of bleeding: yes

## 2016-01-06 LAB — GLUCOSE, CAPILLARY
GLUCOSE-CAPILLARY: 195 mg/dL — AB (ref 65–99)
GLUCOSE-CAPILLARY: 204 mg/dL — AB (ref 65–99)
GLUCOSE-CAPILLARY: 93 mg/dL (ref 65–99)
Glucose-Capillary: 160 mg/dL — ABNORMAL HIGH (ref 65–99)

## 2016-01-06 MED ORDER — FAMOTIDINE 20 MG PO TABS
20.0000 mg | ORAL_TABLET | Freq: Every day | ORAL | Status: DC
  Administered 2016-01-06 – 2016-01-08 (×3): 20 mg via ORAL
  Filled 2016-01-06 (×3): qty 1

## 2016-01-06 NOTE — Evaluation (Signed)
Physical Therapy Evaluation Patient Details Name: MARGORIE OTWELL MRN: [id number] DOB: Aug 22, 1940 Today's Date: 01/06/2016   History of Present Illness  Ayza Delich is a 75 yo female who underwent PLIF at L2-3 due to lumbar stenosis, spondylolisthese and radiculopathy at L2-3 on 9/22.  Clinical Impression  Patient is s/p above surgery resulting in the deficits listed below (see PT Problem List). Pt OOB mobility limited today due to nausea and lightheadedness. Pt with +vomiting. Patient will benefit from skilled PT to increase their independence and safety with mobility (while adhering to their precautions) to allow discharge to the venue listed below.     Follow Up Recommendations Home health PT;Supervision/Assistance - 24 hour    Equipment Recommendations  None recommended by PT    Recommendations for Other Services       Precautions / Restrictions Precautions Precautions: Back Precaution Booklet Issued: Yes (comment) Precaution Comments: pt and daughter with verbal understanding Required Braces or Orthoses: Spinal Brace Spinal Brace: Lumbar corset;Applied in sitting position      Mobility  Bed Mobility Overal bed mobility: Needs Assistance Bed Mobility: Rolling;Sidelying to Sit Rolling: Min assist Sidelying to sit: Min assist       General bed mobility comments: max directional v/c's for precaution adherence, minA for trunk elevation, increased pain with mvmt  Transfers Overall transfer level: Needs assistance Equipment used: Rolling walker (2 wheeled) Transfers: Sit to/from [facility] Sit to Stand: Mod assist Stand pivot transfers: Mod assist       General transfer comment: increased time required, increased pain requiring modA to during transition of hands to walker, completed x2 due to onset of severe lightheadedness and nausea, BP 140/76 s/p sitting x 5 min, during second stand BP 115/72.  Ambulation/Gait             General Gait Details:  limited to stand pvt to chair due to nausea/vomitting and lightheadedness  Stairs            Wheelchair Mobility    Modified Rankin (Stroke Patients Only)       Balance Overall balance assessment: Needs assistance Sitting-balance support: Feet supported;Bilateral upper extremity supported Sitting balance-Leahy Scale: Poor Sitting balance - Comments: requires bilat UE support due to pain     Standing balance-Leahy Scale: Poor Standing balance comment: requires use of RW                             Pertinent Vitals/Pain Pain Assessment: 0-10 Pain Score: 8  Pain Location: back (from shoulders down) Pain Descriptors / Indicators: Sharp Pain Intervention(s): Patient requesting pain meds-RN notified    Home Living Family/patient expects to be discharged to:: Private residence Living Arrangements: Spouse/significant other (who had memory deficits) Available Help at Discharge: Family;Available 24 hours/day Type of Home: House Home Access: Stairs to enter Entrance Stairs-Rails: None Entrance Stairs-Number of Steps: 2 Home Layout: Two level;Able to live on main level with bedroom/bathroom Home Equipment: Gilford Rile - 2 wheels;Cane - single point;Bedside commode      Prior Function Level of Independence: Independent         Comments: drives, could only be up for 10-15 min prior to needing to sit down, didn't use RW or cane but should've     Hand Dominance   Dominant Hand: Right    Extremity/Trunk Assessment   Upper Extremity Assessment: Overall WFL for tasks assessed           Lower Extremity Assessment:  Overall [facility] for tasks assessed      Cervical / Trunk Assessment: Other exceptions  Communication   Communication: HOH  Cognition Arousal/Alertness: Awake/alert Behavior During Therapy: WFL for tasks assessed/performed Overall Cognitive Status: Within Functional Limits for tasks assessed                      General Comments General  comments (skin integrity, edema, etc.): dressing intact, no drainage noted, hemovac still in place    Exercises     Assessment/Plan    PT Assessment Patient needs continued PT services  PT Problem List Decreased strength;Decreased range of motion;Decreased activity tolerance;Decreased balance;Decreased mobility;Decreased knowledge of use of DME          PT Treatment Interventions DME instruction;Gait training;Stair training;Functional mobility training;Therapeutic activities;Therapeutic exercise;Balance training;Neuromuscular re-education    PT Goals (Current goals can be found in the Care Plan section)  Acute Rehab PT Goals Patient Stated Goal: walk PT Goal Formulation: With patient Time For Goal Achievement: 01/13/16 Potential to Achieve Goals: Good    Frequency Min 5X/week   Barriers to discharge        Co-evaluation               End of Session Equipment Utilized During Treatment: Gait belt;Back brace Activity Tolerance: Patient limited by fatigue;Patient limited by pain Patient left: in chair;with call bell/phone within reach;with family/visitor present Nurse Communication: Mobility status (nausea)         Time: [id number] PT Time Calculation (min) (ACUTE ONLY): 35 min   Charges:   PT Evaluation $PT Eval Moderate Complexity: 1 Procedure PT Treatments $Therapeutic Activity: 8-22 mins   PT G CodesKingsley Callander 01/06/2016, 8:23 AM   Kittie Plater, PT, DPT Pager #: [phone] Office #: [phone]

## 2016-01-06 NOTE — Progress Notes (Signed)
Subjective: Patient reports patient doing greatno leg pain back pain well-controlled  Objective: Vital signs in last 24 hours: Temp:  [97.3 F (36.3 C)-98.5 F (36.9 C)] 98.4 F (36.9 C) (09/23 0558) Pulse Rate:  [58-64] 59 (09/23 0558) Resp:  [8-23] 16 (09/23 0558) BP: (114-183)/(37-72) 116/43 (09/23 0558) SpO2:  [98 %-100 %] 99 % (09/23 0558) Weight:  [88 kg (194 lb 0.1 oz)] 88 kg (194 lb 0.1 oz) (09/22 1246)  Intake/Output from previous day: 09/22 0701 - 09/23 0700 In: 3031.3 [P.O.:1280; I.V.:1751.3] Out: 2420 [Urine:2050; Drains:245; Blood:125] Intake/Output this shift: No intake/output data recorded.  strength out of 5 wound clean dry and intact  Lab Results:  Recent Labs  01/03/16 1456  WBC 7.4  HGB 11.7*  HCT 35.8*  PLT 248   BMET  Recent Labs  01/03/16 1456  NA 137  K 4.4  CL 102  CO2 26  GLUCOSE 99  BUN 24*  CREATININE 1.89*  CALCIUM 9.4    Studies/Results: Dg Lumbar Spine 2-3 Views  Result Date: 01/05/2016 CLINICAL DATA:  Extension of prior L3-S1 fusion to the L2-3 level. Intraoperative imaging. EXAM: DG C-ARM 61-120 MIN; LUMBAR SPINE - 2-3 VIEW COMPARISON:  Postmyelogram CT scan 12/15/2015. FINDINGS: Two fluoroscopic intraoperative spot views are provided and demonstrate new pedicle screws in place at L2. There is visualization of prior L3-S1 fusion. IMPRESSION: Extension of L3-S1 fusion to the L2-3 level in progress. No acute abnormality. Electronically Signed   By: Inge Rise M.D.   On: 01/05/2016 11:20   Dg C-arm 1-60 Min  Result Date: 01/05/2016 CLINICAL DATA:  Extension of prior L3-S1 fusion to the L2-3 level. Intraoperative imaging. EXAM: DG C-ARM 61-120 MIN; LUMBAR SPINE - 2-3 VIEW COMPARISON:  Postmyelogram CT scan 12/15/2015. FINDINGS: Two fluoroscopic intraoperative spot views are provided and demonstrate new pedicle screws in place at L2. There is visualization of prior L3-S1 fusion. IMPRESSION: Extension of L3-S1 fusion to the L2-3  level in progress. No acute abnormality. Electronically Signed   By: Inge Rise M.D.   On: 01/05/2016 11:20    Assessment/Plan: PT eval and ambulate today probable discharge tomorrow  LOS: 1 day     Sylus Stgermain P 01/06/2016, 7:31 AM

## 2016-01-06 NOTE — Evaluation (Signed)
Occupational Therapy Evaluation Patient Details Name: Becky Forbes MRN: [id number] DOB: 1940/05/10 Today's Date: 01/06/2016    History of Present Illness Philippine Newvine is a 75 yo female who underwent PLIF at L2-3 due to lumbar stenosis, spondylolisthese and radiculopathy at L2-3 on 9/22.   Clinical Impression   Pt reports she was independent with ADL PTA. Currently pt overall min guard for functional mobility, min assist for donning back brace, and max assist for LB ADL. Began back, safety, and ADL education with pt and family. Pt planning to d/c home with 24/7 supervision from family. Pt would benefit from continued skilled OT to address established goals.    Follow Up Recommendations  No OT follow up;Supervision/Assistance - 24 hour    Equipment Recommendations  Other (comment) (AE)    Recommendations for Other Services       Precautions / Restrictions Precautions Precautions: Back Precaution Booklet Issued: No Precaution Comments: Reviewed back precautions with pt and family Required Braces or Orthoses: Spinal Brace Spinal Brace: Lumbar corset;Applied in sitting position Restrictions Weight Bearing Restrictions: No      Mobility Bed Mobility Overal bed mobility: Needs Assistance Bed Mobility: Rolling;Sidelying to Sit Rolling: Min guard Sidelying to sit: Min guard       General bed mobility comments: Max verbal cues for technique but pt able to perform without physical assist. Close guard for steadying.  Transfers Overall transfer level: Needs assistance Equipment used: Rolling walker (2 wheeled) Transfers: Sit to/from Stand Sit to Stand: Min assist         General transfer comment: Min assist to boost up from EOB x1. VCs for hand placement and technique.    Balance Overall balance assessment: Needs assistance Sitting-balance support: Feet supported;Bilateral upper extremity supported Sitting balance-Leahy Scale: Poor     Standing balance support:  Bilateral upper extremity supported Standing balance-Leahy Scale: Poor Standing balance comment: RW for support                            ADL Overall ADL's : Needs assistance/impaired Eating/Feeding: Set up;Sitting   Grooming: Set up;Supervision/safety;Sitting   Upper Body Bathing: Set up;Supervision/ safety;Sitting   Lower Body Bathing: Maximal assistance;Sit to/from stand   Upper Body Dressing : Minimal assistance;Sitting Upper Body Dressing Details (indicate cue type and reason): to don back brace Lower Body Dressing: Maximal assistance;Sit to/from stand   Toilet Transfer: Min guard;Ambulation;BSC;RW Toilet Transfer Details (indicate cue type and reason): Simulated by sit to stand from EOB with functional mobility in room Toileting- Clothing Manipulation and Hygiene: Maximal assistance;Sit to/from stand       Functional mobility during ADLs: Min guard;Rolling walker General ADL Comments: Educated pt and family on maintaining back precautions during functional activities, log roll technique for bed mobility, use of AE for increased independence with LB ADL (will need AE eudcation, plan for next session). Pt c/o dizziness with initial supine to sit; resloved within 2 minutes sitting EOB. Pt c/o nausea after short functional mobility in room; no emesis, RN notified.     Vision Vision Assessment?: No apparent visual deficits   Perception     Praxis      Pertinent Vitals/Pain Pain Assessment: Faces Faces Pain Scale: Hurts even more Pain Location: back Pain Descriptors / Indicators: Aching;Sore Pain Intervention(s): Monitored during session;Premedicated before session;Repositioned     Hand Dominance Right   Extremity/Trunk Assessment Upper Extremity Assessment Upper Extremity Assessment: Overall WFL for tasks assessed   Lower  Extremity Assessment Lower Extremity Assessment: Defer to PT evaluation   Cervical / Trunk Assessment Cervical / Trunk Assessment:  Other exceptions Cervical / Trunk Exceptions: back surgeries   Communication Communication Communication: HOH   Cognition Arousal/Alertness: Awake/alert Behavior During Therapy: WFL for tasks assessed/performed Overall Cognitive Status: Within Functional Limits for tasks assessed                     General Comments       Exercises       Shoulder Instructions      Home Living Family/patient expects to be discharged to:: Private residence Living Arrangements: Spouse/significant other (who has memory deficits) Available Help at Discharge: Family;Available 24 hours/day Type of Home: House Home Access: Stairs to enter [employer] of Steps: 2 Entrance Stairs-Rails: None Home Layout: Two level;Able to live on main level with bedroom/bathroom     Bathroom Shower/Tub: Tub/shower unit Shower/tub characteristics: Architectural technologist: Standard     Home Equipment: Environmental consultant - 2 wheels;Cane - single point;Bedside commode;Grab bars - tub/shower   Additional Comments: may be able to borrow shower chair from friend      Prior Functioning/Environment Level of Independence: Independent        Comments: drives, could only be up for 10-15 min prior to needing to sit down, didn't use RW or cane but should've        OT Problem List: Decreased strength;Decreased activity tolerance;Impaired balance (sitting and/or standing);Decreased safety awareness;Decreased knowledge of use of DME or AE;Decreased knowledge of precautions;Pain   OT Treatment/Interventions: Self-care/ADL training;Energy conservation;DME and/or AE instruction;Therapeutic activities;Patient/family education;Balance training    OT Goals(Current goals can be found in the care plan section) Acute Rehab OT Goals Patient Stated Goal: get better and return home OT Goal Formulation: With patient/family Time For Goal Achievement: 01/20/16 Potential to Achieve Goals: Good ADL Goals Pt Will Perform Lower  Body Bathing: with supervision;with adaptive equipment;sit to/from stand Pt Will Perform Lower Body Dressing: with supervision;with adaptive equipment;sit to/from stand Pt Will Transfer to Toilet: with supervision;ambulating;bedside commode Pt Will Perform Toileting - Clothing Manipulation and hygiene: with supervision;sit to/from stand (with or without AE) Pt Will Perform Tub/Shower Transfer: Tub transfer;with supervision;ambulating;shower seat;rolling walker Additional ADL Goal #1: Pt will independently verbally recall 3/3 back precautions and maintain throughout ADL. Additional ADL Goal #2: Pt will don/doff back brace with set up as precursor for ADL and functional mobility.  OT Frequency: Min 2X/week   Barriers to D/C:            Co-evaluation              End of Session Equipment Utilized During Treatment: Rolling walker;Back brace Nurse Communication: Mobility status;Other (comment) (nausea and dizziness)  Activity Tolerance: Patient tolerated treatment well Patient left: in chair;with call bell/phone within reach;with family/visitor present   Time: [id number] OT Time Calculation (min): 20 min Charges:  OT General Charges $OT Visit: 1 Procedure OT Evaluation $OT Eval Moderate Complexity: 1 Procedure G-Codes:     Binnie Kand M.S., OTR/L Pager: [phone]  01/06/2016, 12:23 PM

## 2016-01-07 LAB — GLUCOSE, CAPILLARY
GLUCOSE-CAPILLARY: 154 mg/dL — AB (ref 65–99)
Glucose-Capillary: 123 mg/dL — ABNORMAL HIGH (ref 65–99)
Glucose-Capillary: 103 mg/dL — ABNORMAL HIGH (ref 65–99)
Glucose-Capillary: 130 mg/dL — ABNORMAL HIGH (ref 65–99)

## 2016-01-07 NOTE — Progress Notes (Signed)
Physical Therapy Treatment Patient Details Name: Becky Forbes MRN: [id number] DOB: 1940-11-28 Today's Date: 01/07/2016    History of Present Illness Becky Forbes is a 75 yo female who underwent PLIF at L2-3 due to lumbar stenosis, spondylolisthese and radiculopathy at L2-3 on 9/22.    PT Comments    Patient requires frequent cues to maintain back precautions while mobilizing (twists to place walker when turning, reaching for toilet handle, looking over her shoulder when name called, flexing over sink). She is steady on her feet and has excellent support from her family (who also cued pt at times to maintain her back precautions).   Follow Up Recommendations  Home health PT;Supervision/Assistance - 24 hour     Equipment Recommendations  None recommended by PT    Recommendations for Other Services       Precautions / Restrictions Precautions Precautions: Back Precaution Comments: pt and daughter together able to verbalize precautions; req'd vc to maintain during activity Required Braces or Orthoses: Spinal Brace Spinal Brace: Lumbar corset;Applied in sitting position (daughter required cues to properly don)    Mobility  Bed Mobility   Bed Mobility: Rolling;Sidelying to Sit Rolling: Modified independent (Device/Increase time) Sidelying to sit: Modified independent (Device/Increase time)       General bed mobility comments: with rail, HOB flat; no cues needed  Transfers Overall transfer level: Needs assistance Equipment used: Rolling walker (2 wheeled) Transfers: Sit to/from Stand Sit to Stand: Min guard Stand pivot transfers: Supervision       General transfer comment: vc x 3 for hand placement (either ascending or descending) over 3 transfers  Ambulation/Gait Ambulation/Gait assistance: Supervision Ambulation Distance (Feet): 150 Feet Assistive device: Rolling walker (2 wheeled) Gait Pattern/deviations: Step-through pattern;Decreased stride length   Gait velocity  interpretation: Below normal speed for age/gender General Gait Details: good upright posture; cues x 2 for proximity to RW to avoid flexion   Stairs Stairs: Yes Stairs assistance: Min assist Stair Management: Forwards;With walker Number of Stairs: 1 General stair comments: Educated on assist to lift RW up onto platform; sequencing with strong leg for ascend/descend; no flexion as descending from platform  Wheelchair Mobility    Modified Rankin (Stroke Patients Only)       Balance   Sitting-balance support: No upper extremity supported;Feet supported Sitting balance-Leahy Scale: Good Sitting balance - Comments: requires bilat UE support due to pain   Standing balance support: Single extremity supported Standing balance-Leahy Scale: Poor Standing balance comment: tried to prop on her forearms at sink to wash hands                    Cognition Arousal/Alertness: Awake/alert Behavior During Therapy: WFL for tasks assessed/performed Overall Cognitive Status: Within Functional Limits for tasks assessed                      Exercises      General Comments General comments (skin integrity, edema, etc.): family present and eager to learn how to assist pt; involved and asking good questions      Pertinent Vitals/Pain Pain Assessment: 0-10 Pain Score: 4  Pain Location: back Pain Descriptors / Indicators: Aching;Sore Pain Intervention(s): Limited activity within patient's tolerance;Monitored during session;Repositioned    Home Living                      Prior Function            PT Goals (current goals can now be found  in the care plan section) Acute Rehab PT Goals Patient Stated Goal: walk Time For Goal Achievement: 01/13/16 Progress towards PT goals: Progressing toward goals    Frequency    Min 5X/week      PT Plan Current plan remains appropriate    Co-evaluation             End of Session Equipment Utilized During  Treatment: Gait belt;Back brace Activity Tolerance: Patient limited by fatigue Patient left: in chair;with call bell/phone within reach;with family/visitor present     Time: 1348-1410 PT Time Calculation (min) (ACUTE ONLY): 22 min  Charges:  $Gait Training: 8-22 mins                    G Codes:      Becky Forbes 01-23-16, 2:35 PM Pager [phone]

## 2016-01-07 NOTE — Progress Notes (Signed)
Called to patients room  Noted hemovac  Out loose suture removed. Dry dsg applied.

## 2016-01-07 NOTE — Progress Notes (Signed)
Patient ID: Becky Forbes, female   DOB: 08-15-1940, 75 y.o.   MRN: [id number] BP (!) 131/45 (BP Location: Left Arm)   Pulse 60   Temp 98.8 F (37.1 C) (Oral)   Resp 18   Ht 5\' 4"  (1.626 m)   Wt 88 kg (194 lb 0.1 oz)   SpO2 96%   BMI 33.30 kg/m  Alert and oriented x4 speech is clear and fluent Moving all extremities well Wound is clean, dry, and without signs of infection. Discharge tomorrow.

## 2016-01-07 NOTE — Progress Notes (Signed)
Occupational Therapy Treatment Patient Details Name: Becky Forbes MRN: [id number] DOB: [birth year] Today's Date: 01/07/2016    History of present illness Becky Forbes is a 75 yo female who underwent PLIF at L2-3 due to lumbar stenosis, spondylolisthese and radiculopathy at L2-3 on 9/22.   OT comments  Pt. Able to return demo of all use of A/E, and perform safe tub transfer.  Family present for session and active in all education and training.  Clear for d/c from OT.  Will alert OTR/l to sign off  Follow Up Recommendations  No OT follow up;Supervision/Assistance - 24 hour    Equipment Recommendations       Recommendations for Other Services      Precautions / Restrictions Precautions Precautions: Back Precaution Comments: Reviewed back precautions with pt and family Required Braces or Orthoses: Spinal Brace Spinal Brace: Lumbar corset;Applied in sitting position       Mobility Bed Mobility               General bed mobility comments: pt. seated in recliner upon therapist asst. into room  Transfers Overall transfer level: Needs assistance Equipment used: Rolling walker (2 wheeled) Transfers: Sit to/from Stand Sit to Stand: Supervision Stand pivot transfers: Supervision       General transfer comment: education and demonstration on walker safety during transfers and standing at a surface as pt. attempted to push the walker to the side multiple times. family reports she does that often    Balance                                   ADL Overall ADL's : Needs assistance/impaired             Lower Body Bathing: Supervison/ safety;Sit to/from stand;Sitting/lateral leans;Adhering to back precautions;With adaptive equipment       Lower Body Dressing: Supervision/safety;Adhering to back precautions;With adaptive equipment Lower Body Dressing Details (indicate cue type and reason): able to don/doff socks and pants Toilet Transfer:  Supervision/safety;RW Toilet Transfer Details (indicate cue type and reason): Simulated by sit to stand from recliner with functional mobility in room Toileting- Clothing Manipulation and Hygiene: Supervision/safety;Sit to/from stand;Adhering to back precautions   Tub/ Shower Transfer: Tub transfer;Supervision/safety;With caregiver independent assisting;Rolling walker;Ambulation Tub/Shower Transfer Details (indicate cue type and reason): side step over simulated tub height with husband assisting with RW management (dtr. also present and video taped so she could show her sister for when she assists as Radene Journey will be taking turns) Functional mobility during ADLs: Supervision/safety;Rolling walker General ADL Comments: husband, dtr., and son in law present for all education and return demo of use of A/E and tub transfer      Vision                     Perception     Praxis      Cognition   Behavior During Therapy: [facility] for tasks assessed/performed Overall Cognitive Status: Within Functional Limits for tasks assessed                       Extremity/Trunk Assessment               Exercises     Shoulder Instructions       General Comments      Pertinent Vitals/ Pain       Pain Assessment: No/denies pain  Home Living  Prior Functioning/Environment              Frequency  Min 2X/week        Progress Toward Goals  OT Goals(current goals can now be found in the care plan section)  Progress towards OT goals: Goals met/education completed, patient discharged from [facility] Discharge plan remains appropriate    Co-evaluation                 End of Session Equipment Utilized During Treatment: Back brace;Rolling walker   Activity Tolerance Patient tolerated treatment well   Patient Left in chair;with call bell/phone within reach   Nurse Communication          Time:  7065-8260 OT Time Calculation (min): 20 min  Charges: OT General Charges $OT Visit: 1 Procedure OT Treatments $Self Care/Home Management : 8-22 mins  Janice Coffin, COTA/L 01/07/2016, 11:27 AM

## 2016-01-08 LAB — GLUCOSE, CAPILLARY: Glucose-Capillary: 101 mg/dL — ABNORMAL HIGH (ref 65–99)

## 2016-01-08 MED ORDER — HYDROMORPHONE HCL 2 MG PO TABS: 2.0000 mg | ORAL_TABLET | ORAL | 0 refills | Status: DC | PRN

## 2016-01-08 MED ORDER — METHOCARBAMOL 500 MG PO TABS: 500.0000 mg | ORAL_TABLET | Freq: Four times a day (QID) | ORAL | 1 refills | Status: DC | PRN

## 2016-01-08 NOTE — Progress Notes (Signed)
Subjective: Patient reports doing better  Objective: Vital signs in last 24 hours: Temp:  [98.3 F (36.8 C)-98.8 F (37.1 C)] 98.6 F (37 C) (09/25 0556) Pulse Rate:  [60-66] 60 (09/25 0556) Resp:  [18] 18 (09/25 0556) BP: (103-148)/(45-63) 127/49 (09/25 0556) SpO2:  [95 %-97 %] 97 % (09/25 0556)  Intake/Output from previous day: No intake/output data recorded. Intake/Output this shift: No intake/output data recorded.  Physical Exam: Strength full.  Dressing CDI.  Lab Results: No results for input(s): WBC, HGB, HCT, PLT in the last 72 hours. BMET No results for input(s): NA, K, CL, CO2, GLUCOSE, BUN, CREATININE, CALCIUM in the last 72 hours.  Studies/Results: No results found.  Assessment/Plan: Doing well.  Discharge home.    LOS: 3 days    Peggyann Shoals, MD 01/08/2016, 8:21 AM

## 2016-01-08 NOTE — Discharge Instructions (Signed)
Spinal Fusion, Care After °Refer to this sheet in the next few weeks. These instructions provide you with information on caring for yourself after your procedure. Your caregiver may also give you more specific instructions. Your treatment has been planned according to current medical practices, but problems sometimes occur. Call your caregiver if you have any problems or questions after your procedure. °HOME CARE INSTRUCTIONS  °· Take whatever pain medicine has been prescribed by your caregiver. Do not take over-the-counter pain medicine unless directed otherwise by your caregiver. °· Do not drive if you are taking narcotic pain medicines. °· Change your bandage (dressing) if necessary or as directed by your caregiver. °· Do not get your surgical cut (incision) wet. After a few days you may take quick showers (rather than baths), but keep your incision clean and dry. Covering the incision with plastic wrap while you shower should keep your incision dry. A few weeks after surgery, once your incision has healed and your caregiver says it is okay, you can take baths or go swimming. °· If you have been prescribed medicine to prevent your blood from clotting, follow the directions carefully. °· Check the area around your incision often. Look for redness and swelling. Also, look for anything leaking from your wound. You can use a mirror or have a family member inspect your incision if it is in a place where it is difficult for you to see. °· Ask your caregiver what activities you should avoid and for how long. °· Walk as much as possible. °· Do not lift anything heavier than 10 pounds (4.5 kilograms) until your caregiver says it is safe. °· Do not twist or bend for a few weeks. Try not to pull on things. Avoid sitting for long periods of time. Change positions at least every hour. °· Ask your caregiver what kinds of exercise you should do to make your back stronger and when you should begin doing these exercises. °SEEK  IMMEDIATE MEDICAL CARE IF:  °· Pain suddenly becomes much worse. °· The incision area is red, swollen, bleeding, or leaking fluid. °· Your legs or feet become increasingly painful, numb, weak, or swollen. °· You have trouble controlling urination or bowel movements. °· You have trouble breathing. °· You have chest pain. °· You have a fever. °MAKE SURE YOU: °· Understand these instructions. °· Will watch your condition. °· Will get help right away if you are not doing well or get worse. °  °This information is not intended to replace advice given to you by your health care provider. Make sure you discuss any questions you have with your health care provider. °  °Document Released: 10/19/2004 Document Revised: 04/22/2014 Document Reviewed: 09/14/2014 °Elsevier Interactive Patient Education ©2016 [employer] ° °

## 2016-01-08 NOTE — Care Management Note (Signed)
Case Management Note  Patient Details  Name: CELLESTINE SAMAN MRN: [id number] Date of Birth: [birth year]  Subjective/Objective:                    Action/Plan: Patient discharged this am before being seen for [facility] services. CM called the patient at home and inquired about who she would like to use for [facility] PT. Pt states she would like to use [place] ([facility]). Tim with [facility] notified and accepted the referral.   Expected Discharge Date:  01/07/16               Expected Discharge Plan:  [place]  In-House Referral:     Discharge planning Services  CM Consult  Post Acute Care Choice:  Home Health Choice offered to:  Patient  DME Arranged:    DME Agency:     HH Arranged:  PT [place]:  [facility] [place] (now [facility])  Status of Service:  Completed, signed off  If discussed at [employer] of Stay Meetings, dates discussed:    Additional Comments:  Pollie Friar, RN 01/08/2016, 11:10 AM

## 2016-01-08 NOTE — Care Management Important Message (Signed)
Important Message  Patient Details  Name: Becky Forbes MRN: [id number] Date of Birth: April 01, 1941   Medicare Important Message Given:  Yes    Orbie Pyo 01/08/2016, 11:28 AM

## 2016-01-08 NOTE — Progress Notes (Addendum)
Pt discharged home with family, by car, assessment stable, discharge instructions reviewed, 2 printed prescriptions given, all questions answered. IV removed. All cabinets checked and empty.   At this time waiting for volunteer come to wheel pt out.   0956 Pt taken by wheelchair to exit. Time of discharge: [phone]

## 2016-01-08 NOTE — Discharge Summary (Signed)
Physician Discharge Summary  Patient ID: Becky Forbes MRN: [id number] DOB/AGE: 1941/02/20 75 y.o.  Admit date: 01/05/2016 Discharge date: 01/08/2016  Admission Diagnoses:Lumbar stenosis, herniated disc, spondylolisthesis, radiculopathy  Discharge Diagnoses: Lumbar stenosis, herniated disc, spondylolisthesis, radiculopathy Active Problems:   Spondylolisthesis of lumbar region   Discharged Condition: good  Hospital Course: Patient underwent exploration of fusion with decompression and fusion L 23 level.  She did well post-operatively and mobilized with PT.  Consults: None  Significant Diagnostic Studies: None  Treatments: surgery: exploration of fusion with decompression and fusion L 23 level  Discharge Exam: Blood pressure (!) 127/49, pulse 60, temperature 98.6 F (37 C), temperature source Oral, resp. rate 18, height 5\' 4"  (1.626 m), weight 88 kg (194 lb 0.1 oz), SpO2 97 %. Neurologic: Alert and oriented X 3, normal strength and tone. Normal symmetric reflexes. Normal coordination and gait Wound:CDI  Disposition: Home  Discharge Instructions    Face-to-face encounter (required for Medicare/Medicaid patients)    Complete by:  As directed    I Gabrial Poppell D certify that this patient is under my care and that I, or a nurse practitioner or physician's assistant working with me, had a face-to-face encounter that meets the physician face-to-face encounter requirements with this patient on 01/08/2016. The encounter with the patient was in whole, or in part for the following medical condition(s) which is the primary reason for home health care (List medical condition): lumbar stenosis   The encounter with the patient was in whole, or in part, for the following medical condition, which is the primary reason for home health care:  yes   I certify that, based on my findings, the following services are medically necessary home health services:  Physical therapy   Reason for Medically  Necessary Home Health Services:  Therapy- Instruction on use of Assistive Device for Ambulation on all Surfaces   My clinical findings support the need for the above services:  Pain interferes with ambulation/mobility   Further, I certify that my clinical findings support that this patient is homebound due to:  Ambulates short distances less than 300 feet   Home Health    Complete by:  As directed    To provide the following care/treatments:  PT       Medication List    TAKE these medications   acetaminophen 325 MG tablet Commonly known as:  TYLENOL Take 650 mg by mouth every 6 (six) hours as needed for moderate pain.   amiodarone 200 MG tablet Commonly known as:  PACERONE Take 1 tablet (200 mg total) by mouth daily.   atorvastatin 20 MG tablet Commonly known as:  LIPITOR Take 1 tablet (20 mg total) by mouth daily.   bromocriptine 2.5 MG tablet Commonly known as:  PARLODEL Take 1 tablet (2.5 mg total) by mouth daily.   cholecalciferol 400 units Tabs tablet Commonly known as:  VITAMIN D Take 800 Units by mouth daily.   furosemide 20 MG tablet Commonly known as:  LASIX Take 1 tablet (20 mg total) by mouth daily as needed for fluid or edema.   glucose blood test strip Commonly known as:  BAYER CONTOUR NEXT TEST 1 each by Other route daily. And lancets 1/day   HYDROmorphone 2 MG tablet Commonly known as:  DILAUDID Take 1 tablet (2 mg total) by mouth every 4 (four) hours as needed for moderate pain or severe pain.   levothyroxine 88 MCG tablet Commonly known as:  SYNTHROID, LEVOTHROID Take 1 tablet by mouth  daily  What changed:  See the new instructions.   methocarbamol 500 MG tablet Commonly known as:  ROBAXIN Take 1 tablet (500 mg total) by mouth every 6 (six) hours as needed for muscle spasms.   metoprolol 50 MG tablet Commonly known as:  LOPRESSOR Take 1 tablet by mouth two  times daily What changed:  See the new instructions.   multivitamin with minerals Tabs  tablet Take 1 tablet by mouth daily.   ramipril 5 MG capsule Commonly known as:  ALTACE Take 1 capsule by mouth two times daily What changed:  See the new instructions.   repaglinide 2 MG tablet Commonly known as:  PRANDIN Take 1 tablet by mouth 3  times daily before meals What changed:  See the new instructions.   sitaGLIPtin 100 MG tablet Commonly known as:  JANUVIA Take 0.5 tablets (50 mg total) by mouth daily.        Signed: Peggyann Shoals, MD 01/08/2016, 8:22 AM

## 2016-01-08 NOTE — Progress Notes (Signed)
Physical Therapy Treatment Patient Details Name: Becky Forbes MRN: [id number] DOB: 23-Sep-1940 Today's Date: 01/08/2016    History of Present Illness Becky Forbes is a 75 yo female who underwent PLIF at L2-3 due to lumbar stenosis, spondylolisthese and radiculopathy at L2-3 on 9/22.    PT Comments    Pt progressing well, denies dizziness or nausea. Pt functioning at supervision level. Pt able to recall back precautions but requires v/c's to adhere during function. Family aware and understands all precautions and will guide pt as well.   Follow Up Recommendations  Home health PT;Supervision/Assistance - 24 hour     Equipment Recommendations  None recommended by PT    Recommendations for Other Services       Precautions / Restrictions Precautions Precautions: Back Precaution Booklet Issued: Yes (comment) Precaution Comments: pt and daughter with verbal understanding, pt con't to be reminded during function to adhere Required Braces or Orthoses: Spinal Brace Spinal Brace: Lumbar corset Restrictions Weight Bearing Restrictions: No    Mobility  Bed Mobility Overal bed mobility: Needs Assistance Bed Mobility: Rolling;Sidelying to Sit Rolling: Modified independent (Device/Increase time) Sidelying to sit: Modified independent (Device/Increase time)       General bed mobility comments: increased time, v/c's for techqniue  Transfers Overall transfer level: Needs assistance Equipment used: Rolling walker (2 wheeled) Transfers: Sit to/from Stand Sit to Stand: Min guard         General transfer comment: v/c's for hand placement  Ambulation/Gait Ambulation/Gait assistance: Supervision Ambulation Distance (Feet): 150 Feet Assistive device: Rolling walker (2 wheeled) Gait Pattern/deviations: Step-through pattern Gait velocity: wfl for surgery   General Gait Details: good upright posture; cues x 2 for proximity to RW to avoid flexion   Stairs Stairs: Yes Stairs  assistance: Min guard Stair Management: Two rails;Step to pattern Number of Stairs: 2 General stair comments: discussed how to do platform steps with RW. pt able to recall and reports appropriate verbally. instructed on "up with the good, down with the bad" (up with the L down with the R)  Wheelchair Mobility    Modified Rankin (Stroke Patients Only)       Balance Overall balance assessment: Needs assistance         Standing balance support: Single extremity supported Standing balance-Leahy Scale: Poor Standing balance comment: needs RW for safe standing                    Cognition Arousal/Alertness: Awake/alert Behavior During Therapy: WFL for tasks assessed/performed Overall Cognitive Status: Within Functional Limits for tasks assessed       Memory: Decreased short-term memory;Decreased recall of precautions              Exercises      General Comments        Pertinent Vitals/Pain Pain Assessment: Faces (didn't report) Faces Pain Scale: Hurts a little bit Pain Location: back Pain Intervention(s): Monitored during session    Home Living                      Prior Function            PT Goals (current goals can now be found in the care plan section) Acute Rehab PT Goals Patient Stated Goal: walk Progress towards PT goals: Progressing toward goals    Frequency    Min 5X/week      PT Plan Current plan remains appropriate    Co-evaluation  End of Session Equipment Utilized During Treatment: Gait belt;Back brace Activity Tolerance: Patient limited by fatigue Patient left: in chair;with call bell/phone within reach;with family/visitor present     Time: 0920-0933 PT Time Calculation (min) (ACUTE ONLY): 13 min  Charges:  $Gait Training: 8-22 mins                    G Codes:      Kingsley Callander 01/08/2016, 9:49 AM  Kittie Plater, PT, DPT Pager #: [phone] Office #: [phone]

## 2016-01-09 DIAGNOSIS — M4316 Spondylolisthesis, lumbar region: Secondary | ICD-10-CM | POA: Diagnosis not present

## 2016-01-09 DIAGNOSIS — Z4789 Encounter for other orthopedic aftercare: Secondary | ICD-10-CM | POA: Diagnosis not present

## 2016-01-09 DIAGNOSIS — E119 Type 2 diabetes mellitus without complications: Secondary | ICD-10-CM | POA: Diagnosis not present

## 2016-01-09 DIAGNOSIS — M4806 Spinal stenosis, lumbar region: Secondary | ICD-10-CM | POA: Diagnosis not present

## 2016-01-09 DIAGNOSIS — M5116 Intervertebral disc disorders with radiculopathy, lumbar region: Secondary | ICD-10-CM | POA: Diagnosis not present

## 2016-01-09 DIAGNOSIS — I4891 Unspecified atrial fibrillation: Secondary | ICD-10-CM | POA: Diagnosis not present

## 2016-01-11 DIAGNOSIS — M4806 Spinal stenosis, lumbar region: Secondary | ICD-10-CM | POA: Diagnosis not present

## 2016-01-11 DIAGNOSIS — I4891 Unspecified atrial fibrillation: Secondary | ICD-10-CM | POA: Diagnosis not present

## 2016-01-11 DIAGNOSIS — E119 Type 2 diabetes mellitus without complications: Secondary | ICD-10-CM | POA: Diagnosis not present

## 2016-01-11 DIAGNOSIS — M4316 Spondylolisthesis, lumbar region: Secondary | ICD-10-CM | POA: Diagnosis not present

## 2016-01-11 DIAGNOSIS — Z4789 Encounter for other orthopedic aftercare: Secondary | ICD-10-CM | POA: Diagnosis not present

## 2016-01-11 DIAGNOSIS — M5116 Intervertebral disc disorders with radiculopathy, lumbar region: Secondary | ICD-10-CM | POA: Diagnosis not present

## 2016-01-12 ENCOUNTER — Ambulatory Visit: Payer: Medicare Other | Admitting: Endocrinology

## 2016-01-16 DIAGNOSIS — I4891 Unspecified atrial fibrillation: Secondary | ICD-10-CM | POA: Diagnosis not present

## 2016-01-16 DIAGNOSIS — E119 Type 2 diabetes mellitus without complications: Secondary | ICD-10-CM | POA: Diagnosis not present

## 2016-01-16 DIAGNOSIS — Z4789 Encounter for other orthopedic aftercare: Secondary | ICD-10-CM | POA: Diagnosis not present

## 2016-01-16 DIAGNOSIS — M4806 Spinal stenosis, lumbar region: Secondary | ICD-10-CM | POA: Diagnosis not present

## 2016-01-16 DIAGNOSIS — M5116 Intervertebral disc disorders with radiculopathy, lumbar region: Secondary | ICD-10-CM | POA: Diagnosis not present

## 2016-01-16 DIAGNOSIS — M4316 Spondylolisthesis, lumbar region: Secondary | ICD-10-CM | POA: Diagnosis not present

## 2016-01-18 DIAGNOSIS — M4806 Spinal stenosis, lumbar region: Secondary | ICD-10-CM | POA: Diagnosis not present

## 2016-01-18 DIAGNOSIS — M5116 Intervertebral disc disorders with radiculopathy, lumbar region: Secondary | ICD-10-CM | POA: Diagnosis not present

## 2016-01-18 DIAGNOSIS — M4316 Spondylolisthesis, lumbar region: Secondary | ICD-10-CM | POA: Diagnosis not present

## 2016-01-18 DIAGNOSIS — E119 Type 2 diabetes mellitus without complications: Secondary | ICD-10-CM | POA: Diagnosis not present

## 2016-01-18 DIAGNOSIS — I4891 Unspecified atrial fibrillation: Secondary | ICD-10-CM | POA: Diagnosis not present

## 2016-01-18 DIAGNOSIS — Z4789 Encounter for other orthopedic aftercare: Secondary | ICD-10-CM | POA: Diagnosis not present

## 2016-01-23 DIAGNOSIS — Z4789 Encounter for other orthopedic aftercare: Secondary | ICD-10-CM | POA: Diagnosis not present

## 2016-01-23 DIAGNOSIS — M5116 Intervertebral disc disorders with radiculopathy, lumbar region: Secondary | ICD-10-CM | POA: Diagnosis not present

## 2016-01-23 DIAGNOSIS — E119 Type 2 diabetes mellitus without complications: Secondary | ICD-10-CM | POA: Diagnosis not present

## 2016-01-23 DIAGNOSIS — M4806 Spinal stenosis, lumbar region: Secondary | ICD-10-CM | POA: Diagnosis not present

## 2016-01-23 DIAGNOSIS — I4891 Unspecified atrial fibrillation: Secondary | ICD-10-CM | POA: Diagnosis not present

## 2016-01-23 DIAGNOSIS — M4316 Spondylolisthesis, lumbar region: Secondary | ICD-10-CM | POA: Diagnosis not present

## 2016-01-25 ENCOUNTER — Other Ambulatory Visit: Payer: Self-pay | Admitting: Cardiovascular Disease

## 2016-01-25 DIAGNOSIS — Z4789 Encounter for other orthopedic aftercare: Secondary | ICD-10-CM | POA: Diagnosis not present

## 2016-01-25 DIAGNOSIS — M4316 Spondylolisthesis, lumbar region: Secondary | ICD-10-CM | POA: Diagnosis not present

## 2016-01-25 DIAGNOSIS — M4806 Spinal stenosis, lumbar region: Secondary | ICD-10-CM | POA: Diagnosis not present

## 2016-01-25 DIAGNOSIS — M5116 Intervertebral disc disorders with radiculopathy, lumbar region: Secondary | ICD-10-CM | POA: Diagnosis not present

## 2016-01-25 DIAGNOSIS — I4891 Unspecified atrial fibrillation: Secondary | ICD-10-CM | POA: Diagnosis not present

## 2016-01-25 DIAGNOSIS — E119 Type 2 diabetes mellitus without complications: Secondary | ICD-10-CM | POA: Diagnosis not present

## 2016-01-29 DIAGNOSIS — M48062 Spinal stenosis, lumbar region with neurogenic claudication: Secondary | ICD-10-CM | POA: Diagnosis not present

## 2016-01-29 DIAGNOSIS — M545 Low back pain: Secondary | ICD-10-CM | POA: Diagnosis not present

## 2016-01-29 DIAGNOSIS — M4316 Spondylolisthesis, lumbar region: Secondary | ICD-10-CM | POA: Diagnosis not present

## 2016-01-29 DIAGNOSIS — M5126 Other intervertebral disc displacement, lumbar region: Secondary | ICD-10-CM | POA: Diagnosis not present

## 2016-01-29 DIAGNOSIS — M5416 Radiculopathy, lumbar region: Secondary | ICD-10-CM | POA: Diagnosis not present

## 2016-02-02 DIAGNOSIS — Z23 Encounter for immunization: Secondary | ICD-10-CM | POA: Diagnosis not present

## 2016-02-07 ENCOUNTER — Ambulatory Visit (INDEPENDENT_AMBULATORY_CARE_PROVIDER_SITE_OTHER): Payer: Medicare Other | Admitting: Endocrinology

## 2016-02-07 ENCOUNTER — Encounter: Payer: Self-pay | Admitting: Endocrinology

## 2016-02-07 VITALS — BP 142/84 | HR 75 | Ht 64.0 in | Wt 190.0 lb

## 2016-02-07 DIAGNOSIS — N183 Chronic kidney disease, stage 3 (moderate): Secondary | ICD-10-CM | POA: Diagnosis not present

## 2016-02-07 DIAGNOSIS — E1122 Type 2 diabetes mellitus with diabetic chronic kidney disease: Secondary | ICD-10-CM

## 2016-02-07 NOTE — Patient Instructions (Signed)
check your blood sugar once a day.  vary the time of day when you check, between before the 3 meals, and at bedtime.  also check if you have symptoms of your blood sugar being too high or too low.  please keep a record of the readings and bring it to your next appointment here.  please call us sooner if your blood sugar goes below 70, or if it stays over 200.  Please continue the same medications for diabetes.   Please come back for a follow-up appointment in 4 months.

## 2016-02-07 NOTE — Progress Notes (Signed)
Subjective:    Patient ID: Becky Forbes, female    DOB: 01/17/1941, 75 y.o.   MRN: [id number]  HPI Pt returns for f/u of diabetes mellitus: DM type: 2 Dx'ed: [id number] Complications: PAD, renal insufficiency,and retinopathy. Therapy: 3 oral meds.  GDM: never.  DKA: never.  Severe hypoglycemia: never.   Pancreatitis: never.  Other: renal insuff limits rx options; she had edema on pioglitizone, and diarrhea on acarbose; she could not afford invokana.  She stopped welchol due to lack of effect.  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 Interval hx: pt states she feels well in general.  She takes meds as rx'ed.  no cbg record, but states cbg's are well-controlled.   Past Medical History:  Diagnosis Date  . Anemia    hx  . Arthritis   . Carotid artery disease ([place])    a. Carotid duplex in 2010: [id number] RICA, [id number] LICA.  Marland Kitchen Chronic combined systolic and diastolic CHF (congestive heart failure) ([place])   . CKD (chronic kidney disease), stage III    no nephrologist   . Complication of anesthesia   . Diabetes mellitus    x 10 yrs  . GI bleed    a. 04/2012 - lower GIB - felt 2/2 mild ischemic colitis as shown on colonoscopy, was cleared for anticoag 1 week out from procedure.  Marland Kitchen Heart murmur    hx  . HTN (hypertension)   . Hyperlipemia   . Hypothyroidism   . Ischemic colitis ([place])    a. 04/2012 - lower GIB - felt 2/2 mild ischemic colitis as shown on colonoscopy..  . PAF (paroxysmal atrial fibrillation) ([place])   . PONV (postoperative nausea and vomiting)   . 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 Renal insufficiency   . Sick sinus syndrome ([place])   . Symptomatic sinus bradycardia    a. [place] DR  dual-chamber pacemaker 03/2014 by Dr. Rayann Heman.  . Typical atrial flutter [facility])     Past Surgical History:  Procedure Laterality Date  . ABDOMINAL HYSTERECTOMY    . APPENDECTOMY    . BACK SURGERY    . CARPAL TUNNEL RELEASE Right   .  CATARACT EXTRACTION  05/25/2007   RIGHT EYE,left  . COLONOSCOPY  01/31/2009   small external/internal hemorrhoids, diverticulosis in sigmoid colon, one small polyp (biopsied). Biospy essentially normal. Dr.Magod  . COLONOSCOPY  04/30/2012   Procedure: COLONOSCOPY;  Surgeon: Cleotis Nipper, MD;  Location[facility] END[facility]COPY;  Service: Endoscopy;  Laterality: N/A;  unprepped, poss flex only  . FOOT SURGERY Left 2006   MID FOOT RECONSTRUCTION  . GANGLION CYST EXCISION Right 12/2007   Dr.Gramig hand  . JOINT REPLACEMENT     bilateral  . LUMBAR LAMINECTOMY     x3 one fusion  . OSTEOTOMY Left 2006   AND FUSION  . PARS PLANA VITRECTOMY W/ REPAIR OF MACULAR HOLE Right 2010  . PERMANENT PACEMAKER INSERTION N/A 03/29/2014   STJ Assurity dual chamber paceamker implnated by Dr Rayann Heman  . TOTAL KNEE ARTHROPLASTY Bilateral 05/02/08, 07/25/08   Dr.Alusio    Social History   Social History  . Marital status: Married    Spouse name: JAMES  . Number of children: 2  . Years of education: N/A   Occupational History  . 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 Smoking status: Never Smoker  .  Smokeless tobacco: Never Used  . Alcohol use No  . Drug use: No  . Sexual activity: Not Currently   Other Topics Concern  . Not on file   Social History Narrative  . No narrative on file    Current Outpatient Prescriptions on File Prior to Visit  Medication Sig Dispense Refill  . acetaminophen (TYLENOL) 325 MG tablet Take 650 mg by mouth every 6 (six) hours as needed for moderate pain.    Marland Kitchen amiodarone (PACERONE) 200 MG tablet Take 1 tablet (200 mg total) by mouth daily. 90 tablet 3  . atorvastatin (LIPITOR) 20 MG tablet Take 1 tablet (20 mg total) by mouth daily. 90 tablet 3  . bromocriptine (PARLODEL) 2.5 MG tablet Take 1 tablet (2.5 mg total) by mouth daily. 90 tablet 2  . cholecalciferol (VITAMIN D) 400 units TABS tablet Take 800 Units by mouth daily.      . furosemide (LASIX) 20 MG tablet Take 1 tablet (20 mg total) by mouth daily as needed. 90 tablet 1  . glucose blood (BAYER CONTOUR NEXT TEST) test strip 1 each by Other route daily. And lancets 1/day 100 each 12  . HYDROmorphone (DILAUDID) 2 MG tablet Take 1 tablet (2 mg total) by mouth every 4 (four) hours as needed for moderate pain or severe pain. 80 tablet 0  . levothyroxine (SYNTHROID, LEVOTHROID) 88 MCG tablet Take 1 tablet by mouth  daily (Patient taking differently: Take 88 mcg by mouth daily) 90 tablet 2  . methocarbamol (ROBAXIN) 500 MG tablet Take 1 tablet (500 mg total) by mouth every 6 (six) hours as needed for muscle spasms. 60 tablet 1  . metoprolol (LOPRESSOR) 50 MG tablet Take 1 tablet by mouth two  times daily (Patient taking differently: Take 50 mg by mouth twice daily) 180 tablet 3  . Multiple Vitamin (MULITIVITAMIN WITH MINERALS) TABS Take 1 tablet by mouth daily.    . ramipril (ALTACE) 5 MG capsule Take 1 capsule by mouth two times daily (Patient taking differently: Take 5 mg by mouth twice daily) 180 capsule 2  . repaglinide (PRANDIN) 2 MG tablet Take 1 tablet by mouth 3  times daily before meals (Patient taking differently: Take 2 mg by mouth 3  times daily before meals) 270 tablet 0  . sitaGLIPtin (JANUVIA) 100 MG tablet Take 0.5 tablets (50 mg total) by mouth daily. 45 tablet 3   No current facility-administered medications on file prior to visit.     Allergies  Allergen Reactions  . Morphine And Related Itching, Nausea And Vomiting, Rash and Other (See Comments)    01/04/15- patient says she is NOT allergic to morphine.  Morphine IV given this AM 01/04/15 in the ED. No adverse reaction.  Celesta Gentile [Sitagliptin Phosphate] Other (See Comments)    Pt says this caused elevated liver enzymes and creatinine. IS TAKING 1/2 TABLET  . Actos [Pioglitazone Hydrochloride] Swelling    UNSPECIFIED REACTION   . Hydrocodone-Acetaminophen Itching, Nausea And Vomiting and Other (See  Comments)    Per pt 05/12/15 she had this recently in the hospital with no issues at all  . Keflex [Cephalexin] Rash and Other (See Comments)    Burning sensation all over  . Percocet [Oxycodone-Acetaminophen] Nausea And Vomiting    Family History  Problem Relation Age of Onset  . Coronary artery disease Father   . Hypertension Father   . Stroke Father   . Diabetes Father   . Kidney failure Mother   . Hypertension Mother   .  Other Brother     SEPSIS  . Diabetes Brother   . Diabetes Sister   . Kidney failure Sister    BP (!) 142/84   Pulse 75   Ht 5\' 4"  (1.626 m)   Wt 190 lb (86.2 kg)   SpO2 95%   BMI 32.61 kg/m   Review of Systems She denies hypoglycemia    Objective:   Physical Exam VITAL SIGNS:  See vs page GENERAL: no distress Pulses: dorsalis pedis intact bilat.  MSK: no deformity of the feet CV: no leg edema Skin: no ulcer on the feet. normal color and temp on the feet. Neuro: sensation is intact to touch on the feet.  Ext: There is bilateral onychomycosis of the toenails.    Lab Results  Component Value Date   HGBA1C 7.5 (H) 01/03/2016      Assessment & Plan:  Type 2 DM, with PAD: worse. We discussed.  She declines to add another med.   renal insufficiency: this limits oral rx options.  Edema: this precludes rx with pioglitizone.   Patient is advised the following:  Patient Instructions  check your blood sugar once a day.  vary the time of day when you check, between before the 3 meals, and at bedtime.  also check if you have symptoms of your blood sugar being too high or too low.  please keep a record of the readings and bring it to your next appointment here.  please call us sooner if your blood sugar goes below 70, or if it stays over 200.  Please continue the same medications for diabetes.   Please come back for a follow-up appointment in 4 months.

## 2016-03-12 DIAGNOSIS — E113392 Type 2 diabetes mellitus with moderate nonproliferative diabetic retinopathy without macular edema, left eye: Secondary | ICD-10-CM | POA: Diagnosis not present

## 2016-03-12 DIAGNOSIS — E113311 Type 2 diabetes mellitus with moderate nonproliferative diabetic retinopathy with macular edema, right eye: Secondary | ICD-10-CM | POA: Diagnosis not present

## 2016-03-12 DIAGNOSIS — H31009 Unspecified chorioretinal scars, unspecified eye: Secondary | ICD-10-CM | POA: Diagnosis not present

## 2016-03-12 DIAGNOSIS — H43812 Vitreous degeneration, left eye: Secondary | ICD-10-CM | POA: Diagnosis not present

## 2016-03-20 DIAGNOSIS — M545 Low back pain: Secondary | ICD-10-CM | POA: Diagnosis not present

## 2016-03-20 DIAGNOSIS — M5126 Other intervertebral disc displacement, lumbar region: Secondary | ICD-10-CM | POA: Diagnosis not present

## 2016-03-20 DIAGNOSIS — I10 Essential (primary) hypertension: Secondary | ICD-10-CM | POA: Diagnosis not present

## 2016-03-20 DIAGNOSIS — M4316 Spondylolisthesis, lumbar region: Secondary | ICD-10-CM | POA: Diagnosis not present

## 2016-03-20 DIAGNOSIS — Z6832 Body mass index (BMI) 32.0-32.9, adult: Secondary | ICD-10-CM | POA: Diagnosis not present

## 2016-04-25 ENCOUNTER — Other Ambulatory Visit: Payer: Self-pay | Admitting: Cardiovascular Disease

## 2016-04-25 ENCOUNTER — Other Ambulatory Visit: Payer: Self-pay | Admitting: Endocrinology

## 2016-04-26 ENCOUNTER — Other Ambulatory Visit: Payer: Self-pay

## 2016-04-26 MED ORDER — REPAGLINIDE 2 MG PO TABS: ORAL_TABLET | ORAL | 2 refills | Status: DC

## 2016-04-30 DIAGNOSIS — I10 Essential (primary) hypertension: Secondary | ICD-10-CM | POA: Diagnosis not present

## 2016-04-30 DIAGNOSIS — E119 Type 2 diabetes mellitus without complications: Secondary | ICD-10-CM | POA: Diagnosis not present

## 2016-04-30 DIAGNOSIS — M4316 Spondylolisthesis, lumbar region: Secondary | ICD-10-CM | POA: Diagnosis not present

## 2016-04-30 DIAGNOSIS — Z950 Presence of cardiac pacemaker: Secondary | ICD-10-CM | POA: Diagnosis not present

## 2016-04-30 DIAGNOSIS — Z7984 Long term (current) use of oral hypoglycemic drugs: Secondary | ICD-10-CM | POA: Diagnosis not present

## 2016-04-30 DIAGNOSIS — M7542 Impingement syndrome of left shoulder: Secondary | ICD-10-CM | POA: Diagnosis not present

## 2016-05-06 ENCOUNTER — Ambulatory Visit: Payer: Medicare Other | Admitting: Cardiovascular Disease

## 2016-06-10 ENCOUNTER — Ambulatory Visit (INDEPENDENT_AMBULATORY_CARE_PROVIDER_SITE_OTHER): Payer: Medicare Other | Admitting: Endocrinology

## 2016-06-10 ENCOUNTER — Encounter: Payer: Self-pay | Admitting: Endocrinology

## 2016-06-10 VITALS — BP 138/82 | HR 81 | Wt 186.0 lb

## 2016-06-10 DIAGNOSIS — E1122 Type 2 diabetes mellitus with diabetic chronic kidney disease: Secondary | ICD-10-CM

## 2016-06-10 DIAGNOSIS — N183 Chronic kidney disease, stage 3 (moderate): Secondary | ICD-10-CM

## 2016-06-10 LAB — POCT GLYCOSYLATED HEMOGLOBIN (HGB A1C): HEMOGLOBIN A1C: 7.2

## 2016-06-10 NOTE — Patient Instructions (Addendum)
check your blood sugar once a day.  vary the time of day when you check, between before the 3 meals, and at bedtime.  also check if you have symptoms of your blood sugar being too high or too low.  please keep a record of the readings and bring it to your next appointment here.  please call us sooner if your blood sugar goes below 70, or if it stays over 200.  Please continue the same medications for diabetes.   Please come back for a follow-up appointment in 4-5 months.

## 2016-06-10 NOTE — Addendum Note (Signed)
Addended by: Verlin Grills T on: 06/10/2016 09:31 AM   Modules accepted: Orders

## 2016-06-10 NOTE — Progress Notes (Signed)
Subjective:    Patient ID: Becky Forbes, female    DOB: 1940-04-26, 76 y.o.   MRN: [id number]  HPI Pt returns for f/u of diabetes mellitus: DM type: 2 Dx'ed: [id number] Complications: PAD, renal insufficiency,and retinopathy. Therapy: 3 oral meds.  GDM: never.  DKA: never.  Severe hypoglycemia: never.   Pancreatitis: never.  Other: renal insuff limits rx options; she had edema on pioglitizone, and diarrhea on acarbose; she could not afford invokana.  She stopped welchol due to lack of effect.  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 Interval hx: pt states she feels well in general.  She takes meds as rx'ed.  no cbg record, but states cbg's are well-controlled.   Past Medical History:  Diagnosis Date  . Anemia    hx  . Arthritis   . Carotid artery disease ([place])    a. Carotid duplex in 2010: [id number] RICA, [id number] LICA.  Marland Kitchen Chronic combined systolic and diastolic CHF (congestive heart failure) ([place])   . CKD (chronic kidney disease), stage III    no nephrologist   . Complication of anesthesia   . Diabetes mellitus    x 10 yrs  . GI bleed    a. 04/2012 - lower GIB - felt 2/2 mild ischemic colitis as shown on colonoscopy, was cleared for anticoag 1 week out from procedure.  Marland Kitchen Heart murmur    hx  . HTN (hypertension)   . Hyperlipemia   . Hypothyroidism   . Ischemic colitis ([place])    a. 04/2012 - lower GIB - felt 2/2 mild ischemic colitis as shown on colonoscopy..  . PAF (paroxysmal atrial fibrillation) ([place])   . PONV (postoperative nausea and vomiting)   . 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 Renal insufficiency   . Sick sinus syndrome ([place])   . Symptomatic sinus bradycardia    a. [place] DR  dual-chamber pacemaker 03/2014 by Dr. Rayann Heman.  . Typical atrial flutter [facility])     Past Surgical History:  Procedure Laterality Date  . ABDOMINAL HYSTERECTOMY    . APPENDECTOMY    . BACK SURGERY    . CARPAL TUNNEL RELEASE Right   .  CATARACT EXTRACTION  05/25/2007   RIGHT EYE,left  . COLONOSCOPY  01/31/2009   small external/internal hemorrhoids, diverticulosis in sigmoid colon, one small polyp (biopsied). Biospy essentially normal. Dr.Magod  . COLONOSCOPY  04/30/2012   Procedure: COLONOSCOPY;  Surgeon: Cleotis Nipper, MD;  Location: [facility] ENDOSCOPY;  Service: Endoscopy;  Laterality: N/A;  unprepped, poss flex only  . FOOT SURGERY Left 2006   MID FOOT RECONSTRUCTION  . GANGLION CYST EXCISION Right 12/2007   Dr.Gramig hand  . JOINT REPLACEMENT     bilateral  . LUMBAR LAMINECTOMY     x3 one fusion  . OSTEOTOMY Left 2006   AND FUSION  . PARS PLANA VITRECTOMY W/ REPAIR OF MACULAR HOLE Right 2010  . PERMANENT PACEMAKER INSERTION N/A 03/29/2014   STJ Assurity dual chamber paceamker implnated by Dr Rayann Heman  . TOTAL KNEE ARTHROPLASTY Bilateral 05/02/08, 07/25/08   Dr.Alusio    Social History   Social History  . Marital status: Married    Spouse name: JAMES  . Number of children: 2  . Years of education: N/A   Occupational History  . 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 Smoking status: Never Smoker  .  Smokeless tobacco: Never Used  . Alcohol use No  . Drug use: No  . Sexual activity: Not Currently   Other Topics Concern  . Not on file   Social History Narrative  . No narrative on file    Current Outpatient Prescriptions on File Prior to Visit  Medication Sig Dispense Refill  . acetaminophen (TYLENOL) 325 MG tablet Take 650 mg by mouth every 6 (six) hours as needed for moderate pain.    Marland Kitchen amiodarone (PACERONE) 200 MG tablet TAKE 1 TABLET BY MOUTH  DAILY 90 tablet 1  . atorvastatin (LIPITOR) 20 MG tablet TAKE 1 TABLET BY MOUTH  DAILY 90 tablet 1  . bromocriptine (PARLODEL) 2.5 MG tablet TAKE 1 TABLET BY MOUTH  DAILY 90 tablet 3  . cholecalciferol (VITAMIN D) 400 units TABS tablet Take 800 Units by mouth daily.    . furosemide (LASIX) 20 MG tablet TAKE 1  TABLET BY MOUTH  DAILY AS NEEDED 90 tablet 1  . glucose blood (BAYER CONTOUR NEXT TEST) test strip 1 each by Other route daily. And lancets 1/day 100 each 12  . HYDROmorphone (DILAUDID) 2 MG tablet Take 1 tablet (2 mg total) by mouth every 4 (four) hours as needed for moderate pain or severe pain. 80 tablet 0  . levothyroxine (SYNTHROID, LEVOTHROID) 88 MCG tablet Take 1 tablet by mouth  daily (Patient taking differently: Take 88 mcg by mouth daily) 90 tablet 2  . methocarbamol (ROBAXIN) 500 MG tablet Take 1 tablet (500 mg total) by mouth every 6 (six) hours as needed for muscle spasms. 60 tablet 1  . metoprolol (LOPRESSOR) 50 MG tablet Take 1 tablet by mouth two  times daily (Patient taking differently: Take 50 mg by mouth twice daily) 180 tablet 3  . Multiple Vitamin (MULITIVITAMIN WITH MINERALS) TABS Take 1 tablet by mouth daily.    . ramipril (ALTACE) 5 MG capsule Take 1 capsule by mouth two times daily (Patient taking differently: Take 5 mg by mouth twice daily) 180 capsule 2  . repaglinide (PRANDIN) 2 MG tablet Take 1 tablet by mouth 3  times daily before meals 270 tablet 2  . sitaGLIPtin (JANUVIA) 100 MG tablet Take 0.5 tablets (50 mg total) by mouth daily. 45 tablet 3   No current facility-administered medications on file prior to visit.     Allergies  Allergen Reactions  . Morphine And Related Itching, Nausea And Vomiting, Rash and Other (See Comments)    01/04/15- patient says she is NOT allergic to morphine.  Morphine IV given this AM 01/04/15 in the ED. No adverse reaction.  Celesta Gentile [Sitagliptin Phosphate] Other (See Comments)    Pt says this caused elevated liver enzymes and creatinine. IS TAKING 1/2 TABLET  . Actos [Pioglitazone Hydrochloride] Swelling    UNSPECIFIED REACTION   . Hydrocodone-Acetaminophen Itching, Nausea And Vomiting and Other (See Comments)    Per pt 05/12/15 she had this recently in the hospital with no issues at all  . Keflex [Cephalexin] Rash and Other (See  Comments)    Burning sensation all over  . Percocet [Oxycodone-Acetaminophen] Nausea And Vomiting    Family History  Problem Relation Age of Onset  . Coronary artery disease Father   . Hypertension Father   . Stroke Father   . Diabetes Father   . Kidney failure Mother   . Hypertension Mother   . Other Brother     SEPSIS  . Diabetes Brother   . Diabetes Sister   . Kidney  failure Sister     BP 138/82 (BP Location: Left Arm, Patient Position: Sitting)   Pulse 81   Wt 186 lb (84.4 kg)   SpO2 98%   BMI 31.93 kg/m    Review of Systems She denies hypoglycemia.      Objective:   Physical Exam VITAL SIGNS:  See vs page GENERAL: no distress Pulses: dorsalis pedis intact bilat.  MSK: no deformity of the feet CV: 1+ bilat leg edema Skin: no ulcer on the feet. normal color and temp on the feet. Neuro: sensation is intact to touch on the feet.  Ext: There is bilateral onychomycosis of the toenails.      Assessment & Plan:  Type 2 DM, with renal insuff: well-controlled.   Patient is advised the following: Patient Instructions  check your blood sugar once a day.  vary the time of day when you check, between before the 3 meals, and at bedtime.  also check if you have symptoms of your blood sugar being too high or too low.  please keep a record of the readings and bring it to your next appointment here.  please call us sooner if your blood sugar goes below 70, or if it stays over 200.  Please continue the same medications for diabetes.   Please come back for a follow-up appointment in 4-5 months.

## 2016-06-21 DIAGNOSIS — M25551 Pain in right hip: Secondary | ICD-10-CM | POA: Diagnosis not present

## 2016-06-21 DIAGNOSIS — M7061 Trochanteric bursitis, right hip: Secondary | ICD-10-CM | POA: Diagnosis not present

## 2016-06-26 DIAGNOSIS — H53002 Unspecified amblyopia, left eye: Secondary | ICD-10-CM | POA: Diagnosis not present

## 2016-06-26 DIAGNOSIS — H3561 Retinal hemorrhage, right eye: Secondary | ICD-10-CM | POA: Diagnosis not present

## 2016-06-26 DIAGNOSIS — E113391 Type 2 diabetes mellitus with moderate nonproliferative diabetic retinopathy without macular edema, right eye: Secondary | ICD-10-CM | POA: Diagnosis not present

## 2016-06-26 DIAGNOSIS — H35341 Macular cyst, hole, or pseudohole, right eye: Secondary | ICD-10-CM | POA: Diagnosis not present

## 2016-06-26 DIAGNOSIS — H43812 Vitreous degeneration, left eye: Secondary | ICD-10-CM | POA: Diagnosis not present

## 2016-06-26 DIAGNOSIS — E113392 Type 2 diabetes mellitus with moderate nonproliferative diabetic retinopathy without macular edema, left eye: Secondary | ICD-10-CM | POA: Diagnosis not present

## 2016-06-26 LAB — HM DIABETES EYE EXAM

## 2016-06-27 ENCOUNTER — Encounter: Payer: Self-pay | Admitting: Internal Medicine

## 2016-07-02 ENCOUNTER — Ambulatory Visit (INDEPENDENT_AMBULATORY_CARE_PROVIDER_SITE_OTHER): Payer: Medicare Other | Admitting: Internal Medicine

## 2016-07-02 ENCOUNTER — Other Ambulatory Visit (INDEPENDENT_AMBULATORY_CARE_PROVIDER_SITE_OTHER): Payer: Medicare Other

## 2016-07-02 ENCOUNTER — Encounter: Payer: Self-pay | Admitting: Internal Medicine

## 2016-07-02 VITALS — BP 140/82 | HR 100 | Temp 98.1°F | Resp 16 | Ht 64.0 in | Wt 185.0 lb

## 2016-07-02 DIAGNOSIS — I10 Essential (primary) hypertension: Secondary | ICD-10-CM

## 2016-07-02 DIAGNOSIS — E039 Hypothyroidism, unspecified: Secondary | ICD-10-CM | POA: Diagnosis not present

## 2016-07-02 DIAGNOSIS — Z0000 Encounter for general adult medical examination without abnormal findings: Secondary | ICD-10-CM

## 2016-07-02 DIAGNOSIS — N183 Chronic kidney disease, stage 3 unspecified: Secondary | ICD-10-CM

## 2016-07-02 DIAGNOSIS — T462X1A Poisoning by other antidysrhythmic drugs, accidental (unintentional), initial encounter: Secondary | ICD-10-CM

## 2016-07-02 DIAGNOSIS — E1122 Type 2 diabetes mellitus with diabetic chronic kidney disease: Secondary | ICD-10-CM

## 2016-07-02 DIAGNOSIS — E032 Hypothyroidism due to medicaments and other exogenous substances: Secondary | ICD-10-CM

## 2016-07-02 DIAGNOSIS — N189 Chronic kidney disease, unspecified: Secondary | ICD-10-CM | POA: Insufficient documentation

## 2016-07-02 DIAGNOSIS — E782 Mixed hyperlipidemia: Secondary | ICD-10-CM

## 2016-07-02 LAB — CBC WITH DIFFERENTIAL/PLATELET
BASOS ABS: 0.1 10*3/uL (ref 0.0–0.1)
Basophils Relative: 1.1 % (ref 0.0–3.0)
EOS PCT: 5.7 % — AB (ref 0.0–5.0)
Eosinophils Absolute: 0.4 10*3/uL (ref 0.0–0.7)
HEMATOCRIT: 35.4 % — AB (ref 36.0–46.0)
HEMOGLOBIN: 11.9 g/dL — AB (ref 12.0–15.0)
LYMPHS ABS: 1.1 10*3/uL (ref 0.7–4.0)
LYMPHS PCT: 13.6 % (ref 12.0–46.0)
MCHC: 33.6 g/dL (ref 30.0–36.0)
MCV: 97.8 fl (ref 78.0–100.0)
MONOS PCT: 7.8 % (ref 3.0–12.0)
Monocytes Absolute: 0.6 10*3/uL (ref 0.1–1.0)
NEUTROS PCT: 71.8 % (ref 43.0–77.0)
Neutro Abs: 5.6 10*3/uL (ref 1.4–7.7)
Platelets: 261 10*3/uL (ref 150.0–400.0)
RBC: 3.62 Mil/uL — AB (ref 3.87–5.11)
RDW: 15.1 % (ref 11.5–15.5)
WBC: 7.8 10*3/uL (ref 4.0–10.5)

## 2016-07-02 LAB — COMPREHENSIVE METABOLIC PANEL
ALT: 37 U/L — ABNORMAL HIGH (ref 0–35)
AST: 59 U/L — ABNORMAL HIGH (ref 0–37)
Albumin: 3.6 g/dL (ref 3.5–5.2)
Alkaline Phosphatase: 97 U/L (ref 39–117)
BUN: 22 mg/dL (ref 6–23)
CALCIUM: 9.6 mg/dL (ref 8.4–10.5)
CO2: 29 mEq/L (ref 19–32)
Chloride: 102 mEq/L (ref 96–112)
Creatinine, Ser: 1.11 mg/dL (ref 0.40–1.20)
GFR: 50.73 mL/min — AB (ref >60.00)
GLUCOSE: 117 mg/dL — AB (ref 70–99)
POTASSIUM: 3.9 meq/L (ref 3.5–5.1)
Sodium: 138 mEq/L (ref 135–145)
TOTAL PROTEIN: 7.5 g/dL (ref 6.0–8.3)
Total Bilirubin: 1.1 mg/dL (ref 0.2–1.2)

## 2016-07-02 LAB — TSH: TSH: 1.37 u[IU]/mL (ref 0.35–4.50)

## 2016-07-02 LAB — T4, FREE: Free T4: 1.47 ng/dL (ref 0.60–1.60)

## 2016-07-02 NOTE — Progress Notes (Signed)
Pre visit review using our clinic review tool, if applicable. No additional management support is needed unless otherwise documented below in the visit note. 

## 2016-07-02 NOTE — Progress Notes (Signed)
Subjective:    Patient ID: Becky Forbes, female    DOB: Mar 16, 1941, 76 y.o.   MRN: [id number]  HPI  Here for medicare wellness exam and follow up of her chronic medical problems.   I have personally reviewed and have noted 1.The patient's medical and social history 2.Their use of alcohol, tobacco or illicit drugs 3.Their current medications and supplements 4.The patient's functional ability including ADL's, fall risks, home safety risks and hearing or visual impairment. 5.Diet and physical activities 6.Evidence for depression or mood disorders 7.Care team reviewed  - cariology - dr Cathie Olden, eye - dr Zadie Rhine and dr Katy Fitch, endocrine - dr Loanne Drilling   Are there smokers in your home (other than you)? No  Risk Factors Exercise: none currently Dietary issues discussed: overall well balanced, better since her husband has been home from the hospital  Cardiac risk factors: advanced age, hypertension, hyperlipidemia, and obesity (BMI >= 30 kg/m2).  Depression Screen  Have you felt down, depressed or hopeless? No  Have you felt little interest or pleasure in doing things?  No  Activities of Daily Living In your present state of health, do you have any difficulty performing the following activities?:  Driving? No Managing money?  No Feeding yourself? No Getting from bed to chair? No Climbing a flight of stairs? Yes - avoids stairs - does not need to go up any at home Preparing food and eating?: No Bathing or showering? No Getting dressed: No Getting to/using the toilet? No Moving around from place to place: No In the past year have you fallen or had a near fall?: No   Are you sexually active?  No  Do you have more than one partner?  N/A  Hearing Difficulties: No Do you often ask people to speak up or repeat themselves? yes Do you experience ringing or noises in your ears? No Do you have difficulty understanding soft or  whispered voices? yes Vision:              Any change in vision:  no             Up to date with eye exam: yes Memory:  Do you feel that you have a problem with memory? No  Do you often misplace items? No  Do you feel safe at home?  Yes  Cognitive Testing  Alert, Orientated? Yes  Normal Appearance? Yes  Recall of three objects?  Yes  Can perform simple calculations? Yes  Displays appropriate judgment? Yes  Can read the correct time from a watch face? Yes   Advanced Directives have been discussed with the patient? Yes - in place   Chronic medical problems.  Diabetes: She follows with Dr Loanne Drilling.  She is taking her medication daily as prescribed. She is compliant with a diabetic diet. She is not exercising regularly. She checks her feet daily and denies foot lesions. She is up-to-date with an ophthalmology examination.   Hyperlipidemia: She is taking her medication daily. She is compliant with a low fat/cholesterol diet. She is not exercising regularly. She denies myalgias.   Hypothyroidism:  She is taking her medication daily.  She does feel fatigued but her husband has been sick.     Medications and allergies reviewed with patient and updated if appropriate.  Patient Active Problem List   Diagnosis Date Noted  . Spondylolisthesis of lumbar region 01/05/2016  . Diabetes ([place]) 06/14/2015  . Fracture of rib of left side 04/07/2015  . Hypothyroidism due to  amiodarone 03/27/2015  . BRBPR (bright red blood per rectum) 01/04/2015  . Paroxysmal a-fib ([place]) 01/04/2015  . Colitis, acute 01/04/2015  . Benign essential HTN 01/04/2015  . Acute renal failure superimposed on stage 3 chronic kidney disease ([place]) 01/04/2015  . History of colonic polyps 09/26/2014  . Chronic combined systolic and diastolic CHF (congestive heart failure) ([place])   . Carotid artery disease ([place])   . Symptomatic sinus bradycardia 03/31/2014  . S/P cardiac pacemaker procedure, [employer]. Jude device 03/31/2014  . Sick  sinus syndrome ([place])   . Typical atrial flutter ([place])   . Ischemic colitis ([place]) 05/06/2012  . Diverticulosis 04/28/2012  . HYPERLIPIDEMIA 06/23/2007  . 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 Osteopenia 02/11/2007    Current Outpatient Prescriptions on File Prior to Visit  Medication Sig Dispense Refill  . acetaminophen (TYLENOL) 325 MG tablet Take 650 mg by mouth every 6 (six) hours as needed for moderate pain.    Marland Kitchen amiodarone (PACERONE) 200 MG tablet TAKE 1 TABLET BY MOUTH  DAILY 90 tablet 1  . atorvastatin (LIPITOR) 20 MG tablet TAKE 1 TABLET BY MOUTH  DAILY 90 tablet 1  . bromocriptine (PARLODEL) 2.5 MG tablet TAKE 1 TABLET BY MOUTH  DAILY 90 tablet 3  . cholecalciferol (VITAMIN D) 400 units TABS tablet Take 800 Units by mouth daily.    . furosemide (LASIX) 20 MG tablet TAKE 1 TABLET BY MOUTH  DAILY AS NEEDED 90 tablet 1  . glucose blood (BAYER CONTOUR NEXT TEST) test strip 1 each by Other route daily. And lancets 1/day 100 each 12  . levothyroxine (SYNTHROID, LEVOTHROID) 88 MCG tablet Take 1 tablet by mouth  daily (Patient taking differently: Take 88 mcg by mouth daily) 90 tablet 2  . metoprolol (LOPRESSOR) 50 MG tablet Take 1 tablet by mouth two  times daily (Patient taking differently: Take 50 mg by mouth twice daily) 180 tablet 3  . Multiple Vitamin (MULITIVITAMIN WITH MINERALS) TABS Take 1 tablet by mouth daily.    . ramipril (ALTACE) 5 MG capsule Take 1 capsule by mouth two times daily (Patient taking differently: Take 5 mg by mouth twice daily) 180 capsule 2  . repaglinide (PRANDIN) 2 MG tablet Take 1 tablet by mouth 3  times daily before meals 270 tablet 2  . sitaGLIPtin (JANUVIA) 100 MG tablet Take 0.5 tablets (50 mg total) by mouth daily. 45 tablet 3   No current facility-administered medications on file prior to visit.     Past Medical History:  Diagnosis Date  . Anemia    hx  . Arthritis   . Carotid artery disease ([place])    a. Carotid duplex in 2010:  16-10% RICA, 96-04% LICA.  Marland Kitchen Chronic combined systolic and diastolic CHF (congestive heart failure) ([place])   . CKD (chronic kidney disease), stage III    no nephrologist   . Complication of anesthesia   . Diabetes mellitus    x 10 yrs  . GI bleed    a. 04/2012 - lower GIB - felt 2/2 mild ischemic colitis as shown on colonoscopy, was cleared for anticoag 1 week out from procedure.  Marland Kitchen Heart murmur    hx  . HTN (hypertension)   . Hyperlipemia   . Hypothyroidism   . Ischemic colitis ([place])    a. 04/2012 - lower GIB - felt 2/2 mild ischemic colitis as shown on colonoscopy..  . PAF (paroxysmal atrial fibrillation) ([place])   . PONV (postoperative nausea and vomiting)   .  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 Renal insufficiency   . Sick sinus syndrome ([place])   . Symptomatic sinus bradycardia    a. [place] DR  dual-chamber pacemaker 03/2014 by Dr. Rayann Heman.  . Typical atrial flutter [facility])     Past Surgical History:  Procedure Laterality Date  . ABDOMINAL HYSTERECTOMY    . APPENDECTOMY    . BACK SURGERY    . CARPAL TUNNEL RELEASE Right   . CATARACT EXTRACTION  05/25/2007   RIGHT EYE,left  . COLONOSCOPY  01/31/2009   small external/internal hemorrhoids, diverticulosis in sigmoid colon, one small polyp (biopsied). Biospy essentially normal. Dr.Magod  . COLONOSCOPY  04/30/2012   Procedure: COLONOSCOPY;  Surgeon: Cleotis Nipper, MD;  Location: [facility];  Service: Endoscopy;  Laterality: N/A;  unprepped, poss flex only  . FOOT SURGERY Left 2006   MID FOOT RECONSTRUCTION  . GANGLION CYST EXCISION Right 12/2007   Dr.Gramig hand  . JOINT REPLACEMENT     bilateral  . LUMBAR LAMINECTOMY     x3 one fusion  . OSTEOTOMY Left 2006   AND FUSION  . PARS PLANA VITRECTOMY W/ REPAIR OF MACULAR HOLE Right 2010  . PERMANENT PACEMAKER INSERTION N/A 03/29/2014   [employer]J [employer] dual chamber paceamker implnated by Dr Rayann Heman  . TOTAL KNEE ARTHROPLASTY Bilateral 05/02/08,  07/25/08   Dr.Alusio    Social History   Social History  . Marital status: Married    Spouse name: JAMES  . Number of children: 2  . Years of education: N/A   Occupational History  . 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 Smoking status: Never Smoker  . Smokeless tobacco: Never Used  . Alcohol use No  . Drug use: No  . Sexual activity: Not Currently   Other Topics Concern  . Not on file   Social History Narrative  . No narrative on file    Family History  Problem Relation Age of Onset  . Coronary artery disease Father   . Hypertension Father   . Stroke Father   . Diabetes Father   . Kidney failure Mother   . Hypertension Mother   . Other Brother     SEPSIS  . Diabetes Brother   . Diabetes Sister   . Kidney failure Sister     Review of Systems  Constitutional: Positive for fatigue. Negative for appetite change, chills, fever and unexpected weight change.  HENT: Positive for hearing loss (mild).   Eyes: Negative for visual disturbance.  Respiratory: Positive for wheezing (occ, at night). Negative for cough and shortness of breath.   Cardiovascular: Positive for leg swelling (rare). Negative for chest pain and palpitations.  Gastrointestinal:       No gerd  Neurological: Positive for headaches (last three mornings). Negative for light-headedness.       Objective:   Vitals:   07/02/16 0958  BP: 140/82  Pulse: 100  Resp: 16  Temp: 98.1 F (36.7 C)   Wt Readings from Last 3 Encounters:  07/02/16 185 lb (83.9 kg)  06/10/16 186 lb (84.4 kg)  02/07/16 190 lb (86.2 kg)   Body mass index is 31.76 kg/m.   Physical Exam    Constitutional: Appears well-developed and well-nourished. No distress.  HENT:  Head: Normocephalic and atraumatic.  Neck: Neck supple. No tracheal deviation present. No thyromegaly present.  No cervical lymphadenopathy Cardiovascular: Normal rate, regular rhythm and normal  heart  sounds.   No murmur heard. No carotid bruit .  No edema Pulmonary/Chest: Effort normal and breath sounds normal. No respiratory distress. No has no wheezes. No rales.  Skin: Skin is warm and dry. Not diaphoretic.  Psychiatric: Normal mood and affect. Behavior is normal.      Assessment & Plan:    Wellness Exam: Immunizations  Up to date  Colonoscopy  No longer needed due to age 38 Up to date  Dexa  Up to date  20 - no longer seeing Eye exam  Up to date  Hearing loss  Mild hearing loss, does not want to be referred  Memory concerns/difficulties  none Independent of ADLs  fully Stressed the importance of regular exercise   Patient received copy of preventative screening tests/immunizations recommended for the next 5-10 years.   See Problem List for Assessment and Plan of chronic medical problems.

## 2016-07-02 NOTE — Assessment & Plan Note (Signed)
cmp

## 2016-07-02 NOTE — Assessment & Plan Note (Signed)
Check tsh  Titrate med dose if needed  

## 2016-07-02 NOTE — Patient Instructions (Signed)
  [name] , Thank you for taking time to come for your Medicare Wellness Visit. I appreciate your ongoing commitment to your health goals. Please review the following plan we discussed and let me know if I can assist you in the future.   These are the goals we discussed: Goals    None      This is a list of the screening recommended for you and due dates:  Health Maintenance  Topic Date Due  . Hemoglobin A1C  12/08/2016  . Complete foot exam   06/10/2017  . Eye exam for diabetics  06/26/2017  . DEXA scan (bone density measurement)  07/24/2017  . Tetanus Vaccine  05/06/2021  . Flu Shot  Completed  . Pneumonia vaccines  Completed     Test(s) ordered today. Your results will be released to [place] (or called to you) after review, usually within 72hours after test completion. If any changes need to be made, you will be notified at that same time.  All other Health Maintenance issues reviewed.   All recommended immunizations and age-appropriate screenings are up-to-date or discussed.  No immunizations administered today.   Medications reviewed and updated.  No changes recommended at this time.    Please followup in one year

## 2016-07-02 NOTE — Assessment & Plan Note (Signed)
Not fasting - will not check lipids Continue daily statin Regular exercise and healthy diet encouraged

## 2016-07-02 NOTE — Assessment & Plan Note (Signed)
Management per Dr. Ellison 

## 2016-07-02 NOTE — Assessment & Plan Note (Signed)
BP Readings from Last 3 Encounters:  07/02/16 140/82  06/10/16 138/82  02/07/16 (!) 142/84    BP slightly elevated Has been monitoring BP at home - better controlled Will monitor and see if controlled - goal less than 140/90

## 2016-07-08 ENCOUNTER — Ambulatory Visit (INDEPENDENT_AMBULATORY_CARE_PROVIDER_SITE_OTHER): Payer: Medicare Other | Admitting: Cardiovascular Disease

## 2016-07-08 ENCOUNTER — Encounter: Payer: Self-pay | Admitting: Cardiovascular Disease

## 2016-07-08 VITALS — BP 130/70 | HR 68 | Ht 64.0 in | Wt 186.8 lb

## 2016-07-08 DIAGNOSIS — I739 Peripheral vascular disease, unspecified: Secondary | ICD-10-CM

## 2016-07-08 DIAGNOSIS — I480 Paroxysmal atrial fibrillation: Secondary | ICD-10-CM | POA: Diagnosis not present

## 2016-07-08 DIAGNOSIS — I10 Essential (primary) hypertension: Secondary | ICD-10-CM

## 2016-07-08 DIAGNOSIS — I5042 Chronic combined systolic (congestive) and diastolic (congestive) heart failure: Secondary | ICD-10-CM

## 2016-07-08 DIAGNOSIS — I779 Disorder of arteries and arterioles, unspecified: Secondary | ICD-10-CM | POA: Diagnosis not present

## 2016-07-08 MED ORDER — AMIODARONE HCL 200 MG PO TABS: 200.0000 mg | ORAL_TABLET | ORAL | 1 refills | Status: DC

## 2016-07-08 NOTE — Patient Instructions (Signed)
Medication Instructions:  DECREASE Amiodarone to 200 mg on Mondays, Wednesdays, and Fridays   Labwork: None Ordered   Testing/Procedures: Your physician has requested that you have a carotid duplex. This test is an ultrasound of the carotid arteries in your neck. It looks at blood flow through these arteries that supply the brain with blood. Allow one hour for this exam. There are no restrictions or special instructions.   Follow-Up: Your physician wants you to follow-up in: 6 months with Dr. Acie Fredrickson.  You will receive a reminder letter in the mail two months in advance. If you don't receive a letter, please call our office to schedule the follow-up appointment.   If you need a refill on your cardiac medications before your next appointment, please call your pharmacy.   Thank you for choosing [facility]! Christen Bame, RN [phone]

## 2016-07-08 NOTE — Progress Notes (Signed)
Cardiology Office Note   Date:  07/08/2016   ID:  Becky Forbes, DOB Oct 25, 1940, MRN [id number]  PCP:  Binnie Rail, MD  Cardiologist:   Mertie Moores, MD   Chief Complaint  Patient presents with  . Follow-up    Paroxysmal Atrial fib   1. Atrial fibrillation / atrial Flutter - paroxysmal 2. Diabetes mellitus 3. Hypertension 4. Hyperlipidemia 5. Chronic systolic CHF - EF 53-61% (assumed to be nonischemic) 6. S/p pacer :   7. Carotid artery disease - mod - severe Left internal carotid stenosis by Duplex in 2010   History of Present Illness:  [name] not had any episodes of chest pain or shortness of breath. She had back surgery earlier this year. [name] not had any chest pain or shortness breath. Her blood pressure has been well controlled. [name] not had any episodes of atrial fibrillation.  Feb. 4, 2014: She was admitted to the hospital in mid January with a GI bleed and was noted to be in atrial fibrillation. She had an echocardiogram which revealed an ejection fraction of 35-40%. She started on Lasix 20 twice a day and metoprolol. She had some blood work drawn last week which revealed a mild increase in her creatinine up to 1.9. She was also found to have a potassium of 6.1. Recheck potassium level was 4.6. [name] noted that her blood pressures been quite low since she left the hospital. She also was having symptoms of orthostatic hypotension.  July 23, 2012:  Becky Forbes is doing well. No further episodes of Afib and no blood in stool. She was admitted to the hospital in January with GI bleeding was found to have atrial fibrillation. Workup at that time revealed what appeared to be ischemic colitis. Her aspirin was held for a week. She improved and has not had any further bleeding. We have been able to restart Coumadin she's not had any bleeding. The bruising that she was having is now better since she has been off the aspirin.  She has a nonischemic cardiomyopathy. Echocardiogram  in January revealed an ejection fraction of 35-40%. She has moderate pulmonary hypertension with an estimated PA pressure of 49. I have reviewed the echo and the results are noted below. Left ventricle: The cavity size was normal. Wall thickness was increased in a pattern of mild LVH. Systolic function was moderately reduced. The estimated ejection fraction was in the range of 35% to 40%. Diffuse hypokinesis. - Mitral valve: Mild regurgitation. - Left atrium: The atrium was mildly dilated. - Right ventricle: The cavity size was mildly dilated. Systolic function was mildly reduced. - Right atrium: The atrium was mildly dilated. - Pulmonary arteries: Systolic pressure was moderately increased. PA peak pressure: 20mm Hg   She continues to have generalized fatigue and malaise.  She has chronic renal insufficiency.  Oct. 23, 2014:  Becky Forbes is in atrial flutter today. No symptoms. breathing is ok. She walks on occasion. [name] not able to tell if her heart rate is irregular or not.  August 03, 2013:   Becky Forbes is doing well. No Cp or dyspnea. Perhaps mild DOE with walking  Echo April 2015 showed improved LV function.  Left ventricle: The cavity size was normal. Systolic function was mildly to moderately reduced. The estimated ejection fraction was in the range of 40% to 45%. Severe hypokinesis of the basal-midinferolateral and inferior myocardium. The study is not technically sufficient to allow evaluation of LV diastolic function. - Mitral valve: Calcified annulus. Mild to moderate regurgitation  directed centrally. - Left atrium: The atrium was severely dilated. - Right atrium: The atrium was moderately dilated  September 22, 2013: Becky Forbes is feeling a little bit better. She seems to be tolerating the amiodarone better than the high dose of metoprolol. [name] not having episodes of chest pain. She thinks her heart rate is better.  Her blood pressure here in the office is a little bit  elevated. Her blood pressures at home have been all in the normal range.  Sept. 10, 2015:  Becky Forbes is feeling quite well. She has not noticed any heart rate irregularities. She has felt a lot better since decreasing the dose of amiodarone to 200 mg a day.   Feb. 25, 2016:   Becky Forbes is a 76 y.o. female who presents for follow-up of her atrial fibrillation. Since I last saw her she's had a pacemaker placed.  She is feeling well.  Has some tenderness and " lumpyness around the pacer for the past week.    still has some dizziness.   12/13/2014: Still has lack of energy  Fatigues with walking .    06/13/2014:  Becky Forbes presents today following a hospitalization for GI bleed. She was admitted on September 21 with an acute GI bleed. This was despite the fact that she had been off her Coumadin for the previous 7 days and her INR was only 1.8. She was stabilized. She is seen back today.  [name] feeling better and has not had any further bleeding.  She has maintained NSR.    Jan. 27, 2017:  Doing ok from a cardiac standpoint. She fell over the Christmas holidays - tripped over some  broken pavement .  BP has been normal at home.   Slightly elevated here - she has not had her meds this am . She had moderate carotid artery disease by duplex scan in 2010. [name] due for a repeat duplex scan. [name] not having any neurologic symptoms. She denies any chest pain or shortness of breath. She does not get much exercise. She is limited by back pain  October 25, 2015:    No CP or dyspnea  BP is a bit high. Trying to watch her diet.   No recent episodes of atrial fib   July 08, 2016:  Becky Forbes is seen today for follow up of her AFib .   Past Medical History:  Diagnosis Date  . Anemia    hx  . Arthritis   . Carotid artery disease ([place])    a. Carotid duplex in 2010: 99-83% RICA, 38-25% LICA.  Marland Kitchen Chronic combined systolic and diastolic CHF (congestive heart failure) ([place])   . CKD (chronic kidney  disease), stage III    no nephrologist   . Complication of anesthesia   . Diabetes mellitus    x 10 yrs  . GI bleed    a. 04/2012 - lower GIB - felt 2/2 mild ischemic colitis as shown on colonoscopy, was cleared for anticoag 1 week out from procedure.  Marland Kitchen Heart murmur    hx  . HTN (hypertension)   . Hyperlipemia   . Hypothyroidism   . Ischemic colitis ([place])    a. 04/2012 - lower GIB - felt 2/2 mild ischemic colitis as shown on colonoscopy..  . PAF (paroxysmal atrial fibrillation) ([place])   . PONV (postoperative nausea and vomiting)   . 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 Renal insufficiency   . Sick sinus syndrome ([place])   . Symptomatic sinus bradycardia  a. [place] DR  dual-chamber pacemaker 03/2014 by Dr. Rayann Heman.  . Typical atrial flutter [facility])     Past Surgical History:  Procedure Laterality Date  . ABDOMINAL HYSTERECTOMY    . APPENDECTOMY    . BACK SURGERY    . CARPAL TUNNEL RELEASE Right   . CATARACT EXTRACTION  05/25/2007   RIGHT EYE,left  . COLONOSCOPY  01/31/2009   small external/internal hemorrhoids, diverticulosis in sigmoid colon, one small polyp (biopsied). Biospy essentially normal. Dr.Magod  . COLONOSCOPY  04/30/2012   Procedure: COLONOSCOPY;  Surgeon: Cleotis Nipper, MD;  Location: [facility] ENDOSCOPY;  Service: Endoscopy;  Laterality: N/A;  unprepped, poss flex only  . FOOT SURGERY Left 2006   MID FOOT RECONSTRUCTION  . GANGLION CYST EXCISION Right 12/2007   Dr.Gramig hand  . JOINT REPLACEMENT     bilateral  . LUMBAR LAMINECTOMY     x3 one fusion  . OSTEOTOMY Left 2006   AND FUSION  . PARS PLANA VITRECTOMY W/ REPAIR OF MACULAR HOLE Right 2010  . PERMANENT PACEMAKER INSERTION N/A 03/29/2014   STJ Assurity dual chamber paceamker implnated by Dr Rayann Heman  . TOTAL KNEE ARTHROPLASTY Bilateral 05/02/08, 07/25/08   Dr.Alusio     Current Outpatient Prescriptions  Medication Sig Dispense Refill  . acetaminophen (TYLENOL) 325 MG tablet  Take 650 mg by mouth every 6 (six) hours as needed for moderate pain.    Marland Kitchen amiodarone (PACERONE) 200 MG tablet TAKE 1 TABLET BY MOUTH  DAILY 90 tablet 1  . atorvastatin (LIPITOR) 20 MG tablet TAKE 1 TABLET BY MOUTH  DAILY 90 tablet 1  . bromocriptine (PARLODEL) 2.5 MG tablet TAKE 1 TABLET BY MOUTH  DAILY 90 tablet 3  . cholecalciferol (VITAMIN D) 400 units TABS tablet Take 800 Units by mouth daily.    . furosemide (LASIX) 20 MG tablet TAKE 1 TABLET BY MOUTH  DAILY AS NEEDED 90 tablet 1  . glucose blood (BAYER CONTOUR NEXT TEST) test strip 1 each by Other route daily. And lancets 1/day 100 each 12  . levothyroxine (SYNTHROID, LEVOTHROID) 88 MCG tablet Take 1 tablet by mouth  daily (Patient taking differently: Take 88 mcg by mouth daily) 90 tablet 2  . metoprolol (LOPRESSOR) 50 MG tablet Take 1 tablet by mouth two  times daily (Patient taking differently: Take 50 mg by mouth twice daily) 180 tablet 3  . Multiple Vitamin (MULITIVITAMIN WITH MINERALS) TABS Take 1 tablet by mouth daily.    . ramipril (ALTACE) 5 MG capsule Take 1 capsule by mouth two times daily (Patient taking differently: Take 5 mg by mouth twice daily) 180 capsule 2  . repaglinide (PRANDIN) 2 MG tablet Take 1 tablet by mouth 3  times daily before meals 270 tablet 2  . sitaGLIPtin (JANUVIA) 100 MG tablet Take 0.5 tablets (50 mg total) by mouth daily. 45 tablet 3   No current facility-administered medications for this visit.     Allergies:   Morphine and related; Januvia [sitagliptin phosphate]; Actos [pioglitazone hydrochloride]; Hydrocodone-acetaminophen; Keflex [cephalexin]; and Percocet [oxycodone-acetaminophen]    Social History:  The patient  reports that she has never smoked. She has never used smokeless tobacco. She reports that she does not drink alcohol or use drugs.   Family History:  The patient's family history includes Coronary artery disease in her father; Diabetes in her brother, father, and sister; Hypertension in  her father and mother; Kidney failure in her mother and sister; Other in her brother; Stroke in her  father.    ROS:  Please see the history of present illness.    Review of Systems: Constitutional:  denies fever, chills, diaphoresis, appetite change and fatigue.  HEENT: denies photophobia, eye pain, redness, hearing loss, ear pain, congestion, sore throat, rhinorrhea, sneezing, neck pain, neck stiffness and tinnitus.  Respiratory: denies SOB, DOE, cough, chest tightness, and wheezing.  Cardiovascular: denies chest pain, palpitations and leg swelling.  Gastrointestinal: denies nausea, vomiting, abdominal pain, diarrhea, constipation, blood in stool.  Genitourinary: denies dysuria, urgency, frequency, hematuria, flank pain and difficulty urinating.  Musculoskeletal: denies  myalgias, back pain, joint swelling, arthralgias and gait problem.   Skin: denies pallor, rash and wound.  Neurological: denies dizziness, seizures, syncope, weakness, light-headedness, numbness and headaches.   Hematological: denies adenopathy, easy bruising, personal or family bleeding history.  Psychiatric/ Behavioral: denies suicidal ideation, mood changes, confusion, nervousness, sleep disturbance and agitation.   All other systems are reviewed and negative.   PHYSICAL EXAM: VS:  BP (!) 160/70 (BP Location: Left Arm, Patient Position: Sitting, Cuff Size: Large)   Pulse 68   Ht 5\' 4"  (1.626 m)   Wt 186 lb 12.8 oz (84.7 kg)   SpO2 98%   BMI 32.06 kg/m  , BMI Body mass index is 32.06 kg/m. GEN: Well nourished, well developed, in no acute distress  HEENT: normal  Neck: no JVD, carotid bruits, or masses Cardiac: RRR; no murmurs, rubs, or gallops,no edema  Respiratory:  clear to auscultation bilaterally, normal work of breathing GI: soft, nontender, nondistended, + BS MS: no deformity or atrophy  Skin: warm and dry, no rash Neuro:  Strength and sensation are intact Psych: normal  EKG:  EKG is ordered  today. The ekg ordered today demonstrates atrial pacing at 60 .    Recent Labs: 07/02/2016: ALT 37; BUN 22; Creatinine, Ser 1.11; Hemoglobin 11.9; Platelets 261.0; Potassium 3.9; Sodium 138; TSH 1.37   Lipid Panel    Component Value Date/Time   CHOL 117 (L) 08/10/2015 0736   TRIG 65 08/10/2015 0736   TRIG 108 04/21/2006 1234   HDL 38 (L) 08/10/2015 0736   CHOLHDL 3.1 08/10/2015 0736   VLDL 13 08/10/2015 0736   LDLCALC 66 08/10/2015 0736      Wt Readings from Last 3 Encounters:  07/08/16 186 lb 12.8 oz (84.7 kg)  07/02/16 185 lb (83.9 kg)  06/10/16 186 lb (84.4 kg)      Other studies Reviewed: Additional studies/ records that were reviewed today include: . Review of the above records demonstrates:    ASSESSMENT AND PLAN:  1. Atrial fibrillation / atrial Flutter - paroxysmal - she is maintainin NSR , on  amiodarone to 200 a day  We had a long discussion about the long-term side effects of amiodarone. We also discussed the possibility that by decreasing the dose or stopping the amiodarone that she could likely go back into atrial fibrillation.  At this point we will decrease the dose of amiodarone to 2 mg 3 times a week. I'll see her again in 6 months for follow-up visit.   she is currently off Coumadin. She had a major GI bleed despite the fact that she had a subtherapeutic INR. We discussed anticoagulation - she does not want to go back on coumdain or DOAC .   2. Diabetes mellitus 3. Hypertension - BP is well controlled.   4. Hyperlipidemia -  her LDL was mildly elevated at her last lab check in August. We'll increase her atorvastatin 20 mg and  will check fasting lipids, liver enzymes, and basic medical profile in 3 months.  5. Chronic systolic CHF - EF 36-62% (assumed to be nonischemic) - her EF has improved to 55% by echo in Dec. 2015.  Still is breathing well   6. S/p pacer :   She is doing well , pacer is working ok  7 carotid artery disease: She has known  history of carotid artery disease with the left greater than right.   We will repeat carotid duplex scan   Current medicines are reviewed at length with the patient today.  The patient does not have concerns regarding medicines.  The following changes have been made:  no change   Disposition:   FU with me in 6 months     Signed, Mertie Moores, MD  07/08/2016 5:16 PM    [address], [place], [place]  [address] Phone: [phone]; Fax: [phone]

## 2016-07-16 NOTE — Progress Notes (Signed)
Electrophysiology Office Note Date: 07/18/2016  ID:  Becky Forbes, DOB Jul 15, 1940, MRN [id number]  PCP: Binnie Rail, MD Primary Cardiologist: Nahser Electrophysiologist: Allred  CC: Pacemaker follow-up  Becky Forbes is a 76 y.o. female seen today for Dr Rayann Heman.  She presents today for routine electrophysiology followup.  Since last being seen in our clinic, the patient reports doing reasonably well.  Her husband fell on the ice in January and had a ICH for which he was hospitalized for several months.  She has been caring for him and has some increased fatigue.  She denies chest pain, palpitations, dyspnea, PND, orthopnea, nausea, vomiting, dizziness, syncope, edema, weight gain, or early satiety.  Device History: STJ dual chamber PPM implanted 2015 for SSS   Past Medical History:  Diagnosis Date  . Anemia    hx  . Arthritis   . Carotid artery disease ([place])    a. Carotid duplex in 2010: 33-54% RICA, 56-25% LICA.  Marland Kitchen Chronic combined systolic and diastolic CHF (congestive heart failure) ([place])   . CKD (chronic kidney disease), stage III    no nephrologist   . Complication of anesthesia   . Diabetes mellitus    x 10 yrs  . GI bleed    a. 04/2012 - lower GIB - felt 2/2 mild ischemic colitis as shown on colonoscopy, was cleared for anticoag 1 week out from procedure.  Marland Kitchen Heart murmur    hx  . HTN (hypertension)   . Hyperlipemia   . Hypothyroidism   . Ischemic colitis ([place])    a. 04/2012 - lower GIB - felt 2/2 mild ischemic colitis as shown on colonoscopy..  . PAF (paroxysmal atrial fibrillation) ([place])   . PONV (postoperative nausea and vomiting)   . 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 Renal insufficiency   . Sick sinus syndrome ([place])   . Symptomatic sinus bradycardia    a. [place] DR  dual-chamber pacemaker 03/2014 by Dr. Rayann Heman.  . Typical atrial flutter [facility])    Past Surgical History:  Procedure Laterality Date  . ABDOMINAL HYSTERECTOMY     . APPENDECTOMY    . BACK SURGERY    . CARPAL TUNNEL RELEASE Right   . CATARACT EXTRACTION  05/25/2007   RIGHT EYE,left  . COLONOSCOPY  01/31/2009   small external/internal hemorrhoids, diverticulosis in sigmoid colon, one small polyp (biopsied). Biospy essentially normal. Dr.Magod  . COLONOSCOPY  04/30/2012   Procedure: COLONOSCOPY;  Surgeon: Cleotis Nipper, MD;  [facility] END[facility]COPY;  Service: Endoscopy;  Laterality: N/A;  unprepped, poss flex only  . FOOT SURGERY Left 2006   MID FOOT RECONSTRUCTION  . GANGLION CYST EXCISION Right 12/2007   Dr.Gramig hand  . JOINT REPLACEMENT     bilateral  . LUMBAR LAMINECTOMY     x3 one fusion  . OSTEOTOMY Left 2006   AND FUSION  . PARS PLANA VITRECTOMY W/ REPAIR OF MACULAR HOLE Right 2010  . PERMANENT PACEMAKER INSERTION N/A 03/29/2014   STJ Assurity dual chamber paceamker implnated by Dr Rayann Heman  . TOTAL KNEE ARTHROPLASTY Bilateral 05/02/08, 07/25/08   Dr.Alusio    Current Outpatient Prescriptions  Medication Sig Dispense Refill  . acetaminophen (TYLENOL) 325 MG tablet Take 650 mg by mouth every 6 (six) hours as needed for moderate pain.    Marland Kitchen amiodarone (PACERONE) 200 MG tablet Take 1 tablet (200 mg total) by mouth 3 (three) times a week. 90 tablet 1  . atorvastatin (LIPITOR) 20  MG tablet TAKE 1 TABLET BY MOUTH  DAILY 90 tablet 1  . bromocriptine (PARLODEL) 2.5 MG tablet TAKE 1 TABLET BY MOUTH  DAILY 90 tablet 3  . cholecalciferol (VITAMIN D) 400 units TABS tablet Take 800 Units by mouth daily.    . furosemide (LASIX) 20 MG tablet TAKE 1 TABLET BY MOUTH  DAILY AS NEEDED 90 tablet 1  . glucose blood (BAYER CONTOUR NEXT TEST) test strip 1 each by Other route daily. And lancets 1/day 100 each 12  . levothyroxine (SYNTHROID, LEVOTHROID) 88 MCG tablet Take 1 tablet by mouth  daily 90 tablet 2  . metoprolol (LOPRESSOR) 50 MG tablet Take 1 tablet by mouth two  times daily 180 tablet 3  . Multiple Vitamin (MULITIVITAMIN WITH MINERALS) TABS Take 1  tablet by mouth daily.    . ramipril (ALTACE) 5 MG capsule Take 1 capsule by mouth two times daily 180 capsule 2  . repaglinide (PRANDIN) 2 MG tablet Take 1 tablet by mouth 3  times daily before meals 270 tablet 2  . sitaGLIPtin (JANUVIA) 100 MG tablet Take 0.5 tablets (50 mg total) by mouth daily. 45 tablet 3   No current facility-administered medications for this visit.     Allergies:   Morphine and related; Januvia [sitagliptin phosphate]; Actos [pioglitazone hydrochloride]; Hydrocodone-acetaminophen; Keflex [cephalexin]; and Percocet [oxycodone-acetaminophen]   Social History: Social History   Social History  . Marital status: Married    Spouse name: JAMES  . Number of children: 2  . Years of education: N/A   Occupational History  . 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 Smoking status: Never Smoker  . Smokeless tobacco: Never Used  . Alcohol use No  . Drug use: No  . Sexual activity: Not Currently   Other Topics Concern  . Not on file   Social History Narrative  . No narrative on file    Family History: Family History  Problem Relation Age of Onset  . Coronary artery disease Father   . Hypertension Father   . Stroke Father   . Diabetes Father   . Kidney failure Mother   . Hypertension Mother   . Other Brother     SEPSIS  . Diabetes Brother   . Diabetes Sister   . Kidney failure Sister      Review of Systems: All other systems reviewed and are otherwise negative except as noted above.   Physical Exam: VS:  BP 122/70   Pulse 84   Ht 5\' 4"  (1.626 m)   Wt 183 lb 6 oz (83.2 kg)   SpO2 95%   BMI 31.48 kg/m  , BMI Body mass index is 31.48 kg/m.  GEN- The patient is well appearing, alert and oriented x 3 today.   HEENT: normocephalic, atraumatic; sclera clear, conjunctiva pink; hearing intact; oropharynx clear; neck supple  Lungs- Clear to ausculation bilaterally, normal work of breathing.  No wheezes,  rales, rhonchi Heart- Regular rate and rhythm  GI- soft, non-tender, non-distended, bowel sounds present Extremities- no clubbing, cyanosis, or edema  MS- no significant deformity or atrophy Skin- warm and dry, no rash or lesion; PPM pocket well healed Psych- euthymic mood, full affect Neuro- strength and sensation are intact  PPM Interrogation- reviewed in detail today,  See PACEART report  EKG:  EKG is not ordered today.  Recent Labs: 07/02/2016: ALT 37; BUN 22; Creatinine, Ser 1.11; Hemoglobin 11.9; Platelets 261.0; Potassium  3.9; Sodium 138; TSH 1.37   Wt Readings from Last 3 Encounters:  07/18/16 183 lb 6 oz (83.2 kg)  07/08/16 186 lb 12.8 oz (84.7 kg)  07/02/16 185 lb (83.9 kg)     Other studies Reviewed: Additional studies/ records that were reviewed today include: Dr Acie Fredrickson and Dr Jackalyn Lombard office notes  Assessment and Plan:  1.  Sick sinus syndrome Normal PPM function See Pace Art report No changes today  2.  Paroxysmal atrial fibrillation Burden by device interrogation <1% V rates controlled CHADS2VASC of 5. She was previously on Warfarin and had a GI bleed with subtherapeutic INR. We discussed Eliquis vs Watchman today. She is concerned about long term anticoagulation. While her AF burden is low, her CHADS2VASC is high. I will call Dr Watt Climes to see if he thinks she would be candidate for trial of Eliquis and call her back next week.  Continue low dose amiodarone. TSH, LFT's followed by PCP  3.  HTN Stable No change required today  Current medicines are reviewed at length with the patient today.   The patient does not have concerns regarding her medicines.  The following changes were made today:  none  Labs/ tests ordered today include: none No orders of the defined types were placed in this encounter.    Disposition:   Follow up with [place], Dr Rayann Heman 1 year, Dr Acie Fredrickson as scheduled.      Signed, Chanetta Marshall, NP 07/18/2016 9:18 AM  [facility] [address] [place] [address] [phone] (office) [phone] (fax)

## 2016-07-18 ENCOUNTER — Ambulatory Visit (INDEPENDENT_AMBULATORY_CARE_PROVIDER_SITE_OTHER): Payer: Medicare Other | Admitting: Nurse Practitioner

## 2016-07-18 VITALS — BP 122/70 | HR 84 | Ht 64.0 in | Wt 183.4 lb

## 2016-07-18 DIAGNOSIS — I495 Sick sinus syndrome: Secondary | ICD-10-CM

## 2016-07-18 DIAGNOSIS — I480 Paroxysmal atrial fibrillation: Secondary | ICD-10-CM | POA: Diagnosis not present

## 2016-07-18 DIAGNOSIS — I10 Essential (primary) hypertension: Secondary | ICD-10-CM

## 2016-07-18 LAB — CUP PACEART INCLINIC DEVICE CHECK
Date Time Interrogation Session: 20180405091942
Implantable Lead Implant Date: 20151215
Implantable Lead Location: 753859
Implantable Lead Model: 1948
MDC IDC LEAD IMPLANT DT: 20151215
MDC IDC LEAD LOCATION: 753860
MDC IDC PG IMPLANT DT: 20151215
MDC IDC PG SERIAL: 7694159

## 2016-07-18 NOTE — Patient Instructions (Signed)
Medication Instructions:   Your physician recommends that you continue on your current medications as directed. Please refer to the Current Medication list given to you today.   If you need a refill on your cardiac medications before your next appointment, please call your pharmacy.  Labwork:  NONE ORDERED  TODAY    Testing/Procedures: NONE ORDERED  TODAY    Follow-Up: Your physician wants you to follow-up in: Becky Forbes will receive a reminder letter in the mail two months in advance. If you don't receive a letter, please call our office to schedule the follow-up appointment.   Remote monitoring is used to monitor your Pacemaker of ICD from home. This monitoring reduces the number of office visits required to check your device to one time per year. It allows [place] to keep an eye on the functioning of your device to ensure it is working properly. You are scheduled for a device check from home on 10-17-2016 .You may send your transmission at any time that day. If you have a wireless device, the transmission will be sent automatically. After your physician reviews your transmission, you will receive a postcard with your next transmission date.     Any Other Special Instructions Will Be Listed Below (If Applicable).

## 2016-07-23 ENCOUNTER — Telehealth: Payer: Self-pay | Admitting: Internal Medicine

## 2016-08-01 ENCOUNTER — Ambulatory Visit (HOSPITAL_COMMUNITY)
Admission: RE | Admit: 2016-08-01 | Discharge: 2016-08-01 | Disposition: A | Discharge status: Home / Self Care | Payer: Medicare Other | Source: Ambulatory Visit | Attending: Cardiology | Admitting: Cardiology

## 2016-08-01 DIAGNOSIS — I779 Disorder of arteries and arterioles, unspecified: Secondary | ICD-10-CM

## 2016-08-01 DIAGNOSIS — I6523 Occlusion and stenosis of bilateral carotid arteries: Secondary | ICD-10-CM | POA: Insufficient documentation

## 2016-08-01 DIAGNOSIS — I739 Peripheral vascular disease, unspecified: Secondary | ICD-10-CM

## 2016-08-07 ENCOUNTER — Other Ambulatory Visit: Payer: Self-pay | Admitting: Internal Medicine

## 2016-08-07 ENCOUNTER — Other Ambulatory Visit: Payer: Self-pay | Admitting: Cardiovascular Disease

## 2016-09-13 ENCOUNTER — Ambulatory Visit (INDEPENDENT_AMBULATORY_CARE_PROVIDER_SITE_OTHER): Payer: Medicare Other | Admitting: Podiatry

## 2016-09-13 ENCOUNTER — Encounter: Payer: Self-pay | Admitting: Podiatry

## 2016-09-13 VITALS — BP 93/57 | HR 77

## 2016-09-13 DIAGNOSIS — M79674 Pain in right toe(s): Secondary | ICD-10-CM | POA: Diagnosis not present

## 2016-09-13 DIAGNOSIS — M79675 Pain in left toe(s): Secondary | ICD-10-CM

## 2016-09-13 DIAGNOSIS — Q828 Other specified congenital malformations of skin: Secondary | ICD-10-CM

## 2016-09-13 DIAGNOSIS — B351 Tinea unguium: Secondary | ICD-10-CM | POA: Diagnosis not present

## 2016-09-13 DIAGNOSIS — M79676 Pain in unspecified toe(s): Principal | ICD-10-CM

## 2016-09-13 DIAGNOSIS — E119 Type 2 diabetes mellitus without complications: Secondary | ICD-10-CM | POA: Diagnosis not present

## 2016-09-13 NOTE — Progress Notes (Signed)
   Subjective:    Patient ID: Becky Forbes, female    DOB: Nov 12, 1940, 76 y.o.   MRN: [id number]  HPI this patient presents the office for an evaluation of her diabetic feet. She says that she has painful thick nails on both big toes. She says she previously had surgery to the nails on both feet. She also has a painful callus on her left forefoot which is very painful walking and wearing her shoes. She presents the office today for an evaluation and treatment of her diabetic feet    Review of Systems  Eyes: Positive for itching.  Musculoskeletal: Positive for back pain.       Joint pain  Skin:       Change in nails       Objective:   Physical Exam GENERAL APPEARANCE: Alert, conversant. Appropriately groomed. No acute distress.  VASCULAR: Pedal pulses are  palpable at  [facility] and PT bilateral.  Capillary refill time is immediate to all digits,  Normal temperature gradient.  Digital hair growth is present bilateral  NEUROLOGIC: sensation is normal to 5.07 monofilament at 5/5 sites bilateral.  Light touch is intact bilateral, Muscle strength normal.  MUSCULOSKELETAL: acceptable muscle strength, tone and stability bilateral.  Intrinsic muscluature intact bilateral.  Rectus appearance of foot and digits noted bilateral.  NAILS  Thick disfigured discolored great toenails both feet. DERMATOLOGIC: skin color, texture, and turgor are within normal limits.  No preulcerative lesions or ulcers  are seen, no interdigital maceration noted.  No open lesions present.  . No drainage noted. Porokeratosis left forefoot.         Assessment & Plan:  Onychomycosis  Hallux  B/L  Porokeratosis  Left forefoot.  IE  Debridement of hallux toenails which are thick disfigured and discolored. The left hallux toenail is unattached at the nail plate. There is a porokeratosis noted on the left forefoot.  Return to clinic in 3 months for further evaluation and treatment  Gardiner Barefoot DPM

## 2016-09-18 ENCOUNTER — Other Ambulatory Visit: Payer: Self-pay | Admitting: Emergency Medicine

## 2016-09-18 ENCOUNTER — Other Ambulatory Visit: Payer: Self-pay | Admitting: Endocrinology

## 2016-09-18 MED ORDER — LEVOTHYROXINE SODIUM 88 MCG PO TABS: 88.0000 ug | ORAL_TABLET | Freq: Every day | ORAL | 1 refills | Status: DC

## 2016-10-17 ENCOUNTER — Ambulatory Visit (INDEPENDENT_AMBULATORY_CARE_PROVIDER_SITE_OTHER): Payer: Medicare Other | Admitting: *Deleted

## 2016-10-17 DIAGNOSIS — I495 Sick sinus syndrome: Secondary | ICD-10-CM

## 2016-10-17 NOTE — Progress Notes (Signed)
Remote pacemaker transmission.   

## 2016-10-24 ENCOUNTER — Encounter: Payer: Self-pay | Admitting: Internal Medicine

## 2016-10-24 NOTE — Progress Notes (Signed)
Abstracted and sent to scan  

## 2016-10-25 ENCOUNTER — Encounter: Payer: Self-pay | Admitting: Cardiology

## 2016-11-07 ENCOUNTER — Encounter: Payer: Self-pay | Admitting: Endocrinology

## 2016-11-07 ENCOUNTER — Ambulatory Visit (INDEPENDENT_AMBULATORY_CARE_PROVIDER_SITE_OTHER): Payer: Medicare Other | Admitting: Endocrinology

## 2016-11-07 VITALS — BP 122/76 | HR 61 | Ht 64.0 in | Wt 170.0 lb

## 2016-11-07 DIAGNOSIS — E1122 Type 2 diabetes mellitus with diabetic chronic kidney disease: Secondary | ICD-10-CM | POA: Diagnosis not present

## 2016-11-07 DIAGNOSIS — N183 Chronic kidney disease, stage 3 (moderate): Secondary | ICD-10-CM

## 2016-11-07 LAB — POCT GLYCOSYLATED HEMOGLOBIN (HGB A1C): HEMOGLOBIN A1C: 6.5

## 2016-11-07 NOTE — Patient Instructions (Addendum)
check your blood sugar once a day.  vary the time of day when you check, between before the 3 meals, and at bedtime.  also check if you have symptoms of your blood sugar being too high or too low.  please keep a record of the readings and bring it to your next appointment here.  please call us sooner if your blood sugar goes below 70, or if it stays over 200.  Please continue the same medications for diabetes.   Please come back for a follow-up appointment in 6 months.

## 2016-11-07 NOTE — Progress Notes (Signed)
Subjective:    Patient ID: Becky Forbes, female    DOB: 25-Sep-1940, 76 y.o.   MRN: [id number]  HPI Pt returns for f/u of diabetes mellitus: DM type: 2 Dx'ed: 6433 Complications: PAD, renal insufficiency,and retinopathy. Therapy: 3 oral meds.  GDM: never.  DKA: never.  Severe hypoglycemia: never.   Pancreatitis: never.  Other: renal insuff limits rx options; she had edema on pioglitizone, and diarrhea on acarbose; she could not afford invokana.  She stopped welchol due to lack of effect.  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 Interval hx: pt states she feels well in general.  She takes meds as rx'ed.  no cbg record, but states cbg's vary from 67-100's.  It is in general higher as the day goes on.   Past Medical History:  Diagnosis Date  . Anemia    hx  . Arthritis   . Carotid artery disease ([place])    a. Carotid duplex in 2010: 29-51% RICA, 88-41% LICA.  Marland Kitchen Chronic combined systolic and diastolic CHF (congestive heart failure) ([place])   . CKD (chronic kidney disease), stage III    no nephrologist   . Complication of anesthesia   . Diabetes mellitus    x 10 yrs  . GI bleed    a. 04/2012 - lower GIB - felt 2/2 mild ischemic colitis as shown on colonoscopy, was cleared for anticoag 1 week out from procedure.  Marland Kitchen Heart murmur    hx  . HTN (hypertension)   . Hyperlipemia   . Hypothyroidism   . Ischemic colitis ([place])    a. 04/2012 - lower GIB - felt 2/2 mild ischemic colitis as shown on colonoscopy..  . PAF (paroxysmal atrial fibrillation) ([place])   . PONV (postoperative nausea and vomiting)   . 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 Renal insufficiency   . Sick sinus syndrome ([place])   . Symptomatic sinus bradycardia    a. [place] DR  dual-chamber pacemaker 03/2014 by Dr. Rayann Heman.  . Typical atrial flutter [facility])     Past Surgical History:  Procedure Laterality Date  . ABDOMINAL HYSTERECTOMY    . APPENDECTOMY    . BACK  SURGERY    . CARPAL TUNNEL RELEASE Right   . CATARACT EXTRACTION  05/25/2007   RIGHT EYE,left  . COLONOSCOPY  01/31/2009   small external/internal hemorrhoids, diverticulosis in sigmoid colon, one small polyp (biopsied). Biospy essentially normal. Dr.Magod  . COLONOSCOPY  04/30/2012   Procedure: COLONOSCOPY;  Surgeon: Cleotis Nipper, MD;  [facility]: [facility] ENDOSCOPY;  Service: Endoscopy;  Laterality: N/A;  unprepped, poss flex only  . FOOT SURGERY Left 2006   MID FOOT RECONSTRUCTION  . GANGLION CYST EXCISION Right 12/2007   Dr.Gramig hand  . JOINT REPLACEMENT     bilateral  . LUMBAR LAMINECTOMY     x3 one fusion  . OSTEOTOMY Left 2006   AND FUSION  . PARS PLANA VITRECTOMY W/ REPAIR OF MACULAR HOLE Right 2010  . PERMANENT PACEMAKER INSERTION N/A 03/29/2014   STJ Assurity dual chamber paceamker implnated by Dr Rayann Heman  . TOTAL KNEE ARTHROPLASTY Bilateral 05/02/08, 07/25/08   Dr.Alusio    Social History   Social History  . Marital status: Married    Spouse name: JAMES  . Number of children: 2  . Years of education: N/A   Occupational History  . [employer]    RETIRED IN 2007  Social History Main Topics  . Smoking status: Never Smoker  . Smokeless tobacco: Never Used  . Alcohol use No  . Drug use: No  . Sexual activity: Not Currently   Other Topics Concern  . Not on file   Social History Narrative  . No narrative on file    Current Outpatient Prescriptions on File Prior to Visit  Medication Sig Dispense Refill  . acetaminophen (TYLENOL) 325 MG tablet Take 650 mg by mouth every 6 (six) hours as needed for moderate pain.    Marland Kitchen amiodarone (PACERONE) 200 MG tablet TAKE 1 TABLET BY MOUTH  DAILY 90 tablet 1  . atorvastatin (LIPITOR) 20 MG tablet TAKE 1 TABLET BY MOUTH  DAILY 90 tablet 1  . bromocriptine (PARLODEL) 2.5 MG tablet TAKE 1 TABLET BY MOUTH  DAILY 90 tablet 3  . cholecalciferol (VITAMIN D) 400 units TABS tablet Take 800 Units by mouth daily.     . furosemide (LASIX) 20 MG tablet TAKE 1 TABLET BY MOUTH  DAILY AS NEEDED 90 tablet 1  . glucose blood (BAYER CONTOUR NEXT TEST) test strip 1 each by Other route daily. And lancets 1/day 100 each 12  . JANUVIA 100 MG tablet TAKE ONE-HALF TABLET BY  MOUTH DAILY 45 tablet 2  . levothyroxine (SYNTHROID, LEVOTHROID) 88 MCG tablet Take 1 tablet (88 mcg total) by mouth daily. 90 tablet 1  . metoprolol (LOPRESSOR) 50 MG tablet TAKE 1 TABLET BY MOUTH TWO  TIMES DAILY 180 tablet 0  . Multiple Vitamin (MULITIVITAMIN WITH MINERALS) TABS Take 1 tablet by mouth daily.    . ramipril (ALTACE) 5 MG capsule TAKE 1 CAPSULE BY MOUTH TWO TIMES DAILY 180 capsule 3  . repaglinide (PRANDIN) 2 MG tablet Take 1 tablet by mouth 3  times daily before meals 270 tablet 2   No current facility-administered medications on file prior to visit.     Allergies  Allergen Reactions  . Morphine And Related Itching, Nausea And Vomiting, Rash and Other (See Comments)    01/04/15- patient says she is NOT allergic to morphine.  Morphine IV given this AM 01/04/15 in the ED. No adverse reaction.  Celesta Gentile [Sitagliptin Phosphate] Other (See Comments)    Pt says this caused elevated liver enzymes and creatinine. IS TAKING 1/2 TABLET  . Actos [Pioglitazone Hydrochloride] Swelling    UNSPECIFIED REACTION   . Hydrocodone-Acetaminophen Itching, Nausea And Vomiting and Other (See Comments)    Per pt 05/12/15 she had this recently in the hospital with no issues at all  . Keflex [Cephalexin] Rash and Other (See Comments)    Burning sensation all over  . Percocet [Oxycodone-Acetaminophen] Nausea And Vomiting    Family History  Problem Relation Age of Onset  . Coronary artery disease Father   . Hypertension Father   . Stroke Father   . Diabetes Father   . Kidney failure Mother   . Hypertension Mother   . Other Brother        SEPSIS  . Diabetes Brother   . Diabetes Sister   . Kidney failure Sister     BP 122/76   Pulse 61   Ht  5\' 4"  (1.626 m)   Wt 170 lb (77.1 kg)   SpO2 98%   BMI 29.18 kg/m    Review of Systems She denies hypoglycemia.  She has lost a few lbs, due to her efforts    Objective:   Physical Exam VITAL SIGNS:  See vs page GENERAL: no distress  Pulses: foot pulses are intact bilaterally.   MSK: no deformity of the feet or ankles.  CV: trace bilat edema of the legs.  Skin:  no ulcer on the feet or ankles.  normal color and temp on the feet and ankles Neuro: sensation is intact to touch on the feet and ankles.   Ext: There is bilateral onychomycosis of the toenails  A1c=6.5%     Assessment & Plan:  Type 2 DM, with PAD: well-controlled Renal insuff: this limits rx options  Patient Instructions  check your blood sugar once a day.  vary the time of day when you check, between before the 3 meals, and at bedtime.  also check if you have symptoms of your blood sugar being too high or too low.  please keep a record of the readings and bring it to your next appointment here.  please call us sooner if your blood sugar goes below 70, or if it stays over 200.  Please continue the same medications for diabetes.   Please come back for a follow-up appointment in 6 months.

## 2016-11-12 LAB — CUP PACEART REMOTE DEVICE CHECK
Battery Remaining Percentage: 95.5 %
Battery Voltage: 2.99 V
Brady Statistic AP VP Percent: 13 %
Brady Statistic RV Percent Paced: 14 %
Implantable Lead Implant Date: 20151215
Implantable Lead Location: 753860
Implantable Lead Model: 1948
Implantable Pulse Generator Implant Date: 20151215
Lead Channel Impedance Value: 780 Ohm
Lead Channel Pacing Threshold Amplitude: 0.5 V
Lead Channel Pacing Threshold Amplitude: 0.5 V
Lead Channel Sensing Intrinsic Amplitude: 0.6 mV
Lead Channel Setting Pacing Amplitude: 2.5 V
Lead Channel Setting Pacing Pulse Width: 0.4 ms
Lead Channel Setting Sensing Sensitivity: 2 mV
MDC IDC LEAD IMPLANT DT: 20151215
MDC IDC LEAD LOCATION: 753859
MDC IDC MSMT BATTERY REMAINING LONGEVITY: 112 mo
MDC IDC MSMT LEADCHNL RA IMPEDANCE VALUE: 390 Ohm
MDC IDC MSMT LEADCHNL RA PACING THRESHOLD PULSEWIDTH: 0.5 ms
MDC IDC MSMT LEADCHNL RV PACING THRESHOLD PULSEWIDTH: 0.4 ms
MDC IDC MSMT LEADCHNL RV SENSING INTR AMPL: 12 mV
MDC IDC SESS DTM: 20180705060031
MDC IDC SET LEADCHNL RA PACING AMPLITUDE: 2 V
MDC IDC STAT BRADY AP VS PERCENT: 87 %
MDC IDC STAT BRADY AS VP PERCENT: 1 %
MDC IDC STAT BRADY AS VS PERCENT: 1 %
MDC IDC STAT BRADY RA PERCENT PACED: 99 %
Pulse Gen Serial Number: 7694159

## 2016-11-18 ENCOUNTER — Telehealth: Payer: Self-pay | Admitting: Endocrinology

## 2016-11-18 NOTE — Telephone Encounter (Signed)
Patient is calling in for a refill for her Bayer Contour Next Test test strips and needs those sent [facility] on [address].

## 2016-11-19 ENCOUNTER — Other Ambulatory Visit: Payer: Self-pay

## 2016-11-19 MED ORDER — GLUCOSE BLOOD VI STRP: 1.0000 | ORAL_STRIP | Freq: Every day | 12 refills | Status: DC

## 2016-11-19 NOTE — Telephone Encounter (Signed)
Called and left patient a VM that test strips were sent to pharmacy.

## 2016-12-03 NOTE — Telephone Encounter (Signed)
error 

## 2016-12-05 ENCOUNTER — Other Ambulatory Visit: Payer: Self-pay | Admitting: Endocrinology

## 2016-12-05 ENCOUNTER — Other Ambulatory Visit: Payer: Self-pay | Admitting: Internal Medicine

## 2016-12-05 ENCOUNTER — Other Ambulatory Visit: Payer: Self-pay | Admitting: Cardiovascular Disease

## 2016-12-05 MED ORDER — METOPROLOL TARTRATE 50 MG PO TABS: 50.0000 mg | ORAL_TABLET | Freq: Two times a day (BID) | ORAL | 1 refills | Status: DC

## 2017-01-08 DIAGNOSIS — E113491 Type 2 diabetes mellitus with severe nonproliferative diabetic retinopathy without macular edema, right eye: Secondary | ICD-10-CM | POA: Diagnosis not present

## 2017-01-08 DIAGNOSIS — E113492 Type 2 diabetes mellitus with severe nonproliferative diabetic retinopathy without macular edema, left eye: Secondary | ICD-10-CM | POA: Diagnosis not present

## 2017-01-08 DIAGNOSIS — H43812 Vitreous degeneration, left eye: Secondary | ICD-10-CM | POA: Diagnosis not present

## 2017-01-08 DIAGNOSIS — H53002 Unspecified amblyopia, left eye: Secondary | ICD-10-CM | POA: Diagnosis not present

## 2017-01-09 DIAGNOSIS — Z23 Encounter for immunization: Secondary | ICD-10-CM | POA: Diagnosis not present

## 2017-01-16 ENCOUNTER — Ambulatory Visit (INDEPENDENT_AMBULATORY_CARE_PROVIDER_SITE_OTHER): Payer: Medicare Other | Admitting: *Deleted

## 2017-01-16 DIAGNOSIS — I495 Sick sinus syndrome: Secondary | ICD-10-CM

## 2017-01-16 NOTE — Progress Notes (Signed)
Remote pacemaker transmission.   

## 2017-01-20 LAB — CUP PACEART REMOTE DEVICE CHECK
Battery Remaining Percentage: 95.5 %
Battery Voltage: 2.98 V
Brady Statistic AP VP Percent: 13 %
Brady Statistic AP VS Percent: 87 %
Brady Statistic AS VS Percent: 1 %
Brady Statistic RV Percent Paced: 13 %
Implantable Lead Implant Date: 20151215
Implantable Lead Location: 753860
Lead Channel Pacing Threshold Amplitude: 0.5 V
Lead Channel Pacing Threshold Amplitude: 0.5 V
Lead Channel Pacing Threshold Pulse Width: 0.4 ms
Lead Channel Sensing Intrinsic Amplitude: 0.6 mV
Lead Channel Setting Pacing Amplitude: 2.5 V
Lead Channel Setting Pacing Pulse Width: 0.4 ms
Lead Channel Setting Sensing Sensitivity: 2 mV
MDC IDC LEAD IMPLANT DT: 20151215
MDC IDC LEAD LOCATION: 753859
MDC IDC MSMT BATTERY REMAINING LONGEVITY: 109 mo
MDC IDC MSMT LEADCHNL RA IMPEDANCE VALUE: 350 Ohm
MDC IDC MSMT LEADCHNL RA PACING THRESHOLD PULSEWIDTH: 0.5 ms
MDC IDC MSMT LEADCHNL RV IMPEDANCE VALUE: 730 Ohm
MDC IDC MSMT LEADCHNL RV SENSING INTR AMPL: 12 mV
MDC IDC PG IMPLANT DT: 20151215
MDC IDC PG SERIAL: 7694159
MDC IDC SESS DTM: 20181004060013
MDC IDC SET LEADCHNL RA PACING AMPLITUDE: 2 V
MDC IDC STAT BRADY AS VP PERCENT: 1 %
MDC IDC STAT BRADY RA PERCENT PACED: 99 %

## 2017-01-23 ENCOUNTER — Encounter: Payer: Self-pay | Admitting: Cardiology

## 2017-01-23 ENCOUNTER — Other Ambulatory Visit: Payer: Self-pay | Admitting: Internal Medicine

## 2017-02-03 ENCOUNTER — Encounter: Payer: Self-pay | Admitting: Cardiovascular Disease

## 2017-02-03 ENCOUNTER — Ambulatory Visit (INDEPENDENT_AMBULATORY_CARE_PROVIDER_SITE_OTHER): Payer: Medicare Other | Admitting: Cardiovascular Disease

## 2017-02-03 VITALS — BP 124/76 | HR 60 | Ht 64.0 in | Wt 170.0 lb

## 2017-02-03 DIAGNOSIS — E782 Mixed hyperlipidemia: Secondary | ICD-10-CM

## 2017-02-03 DIAGNOSIS — R5383 Other fatigue: Secondary | ICD-10-CM | POA: Diagnosis not present

## 2017-02-03 DIAGNOSIS — I480 Paroxysmal atrial fibrillation: Secondary | ICD-10-CM

## 2017-02-03 LAB — TSH: TSH: 1.75 u[IU]/mL (ref 0.450–4.500)

## 2017-02-03 LAB — BASIC METABOLIC PANEL
BUN/Creatinine Ratio: 16 (ref 12–28)
BUN: 19 mg/dL (ref 8–27)
CALCIUM: 9.3 mg/dL (ref 8.7–10.3)
CHLORIDE: 98 mmol/L (ref 96–106)
CO2: 24 mmol/L (ref 20–29)
CREATININE: 1.2 mg/dL — AB (ref 0.57–1.00)
GFR, EST AFRICAN AMERICAN: 50 mL/min/{1.73_m2} — AB (ref >59)
GFR, EST NON AFRICAN AMERICAN: 44 mL/min/{1.73_m2} — AB (ref >59)
Glucose: 62 mg/dL — ABNORMAL LOW (ref 65–99)
Potassium: 4.6 mmol/L (ref 3.5–5.2)
Sodium: 137 mmol/L (ref 134–144)

## 2017-02-03 LAB — HEPATIC FUNCTION PANEL
ALBUMIN: 4 g/dL (ref 3.5–4.8)
ALT: 17 IU/L (ref 0–32)
AST: 32 IU/L (ref 0–40)
Alkaline Phosphatase: 167 IU/L — ABNORMAL HIGH (ref 39–117)
Bilirubin Total: 1.2 mg/dL (ref 0.0–1.2)
Bilirubin, Direct: 0.29 mg/dL (ref 0.00–0.40)
TOTAL PROTEIN: 7.5 g/dL (ref 6.0–8.5)

## 2017-02-03 LAB — LIPID PANEL
CHOL/HDL RATIO: 3.4 ratio (ref 0.0–4.4)
Cholesterol, Total: 124 mg/dL (ref 100–199)
HDL: 36 mg/dL — ABNORMAL LOW (ref >39)
LDL CALC: 70 mg/dL (ref 0–99)
Triglycerides: 90 mg/dL (ref 0–149)
VLDL Cholesterol Cal: 18 mg/dL (ref 5–40)

## 2017-02-03 MED ORDER — METOPROLOL TARTRATE 25 MG PO TABS: 25.0000 mg | ORAL_TABLET | Freq: Two times a day (BID) | ORAL | 3 refills | Status: DC

## 2017-02-03 NOTE — Progress Notes (Signed)
Cardiology Office Note   Date:  02/03/2017   ID:  Becky Forbes, DOB 08-20-1940, MRN [id number]  PCP:  Binnie Rail, MD  Cardiologist:   Mertie Moores, MD   Chief Complaint  Patient presents with  . Atrial Fibrillation   1. Atrial fibrillation / atrial Flutter - paroxysmal 2. Diabetes mellitus 3. Hypertension 4. Hyperlipidemia 5. Chronic systolic CHF - EF 10-93% (assumed to be nonischemic) 6. S/p pacer :   7. Carotid artery disease - mod - severe Left internal carotid stenosis by Duplex in 2010   History of Present Illness:  [name] not had any episodes of chest pain or shortness of breath. She had back surgery earlier this year. [name] not had any chest pain or shortness breath. Her blood pressure has been well controlled. [name] not had any episodes of atrial fibrillation.  Feb. 4, 2014: She was admitted to the hospital in mid January with a GI bleed and was noted to be in atrial fibrillation. She had an echocardiogram which revealed an ejection fraction of 35-40%. She started on Lasix 20 twice a day and metoprolol. She had some blood work drawn last week which revealed a mild increase in her creatinine up to 1.9. She was also found to have a potassium of 6.1. Recheck potassium level was 4.6. [name] noted that her blood pressures been quite low since she left the hospital. She also was having symptoms of orthostatic hypotension.  July 23, 2012:  Becky Forbes is doing well. No further episodes of Afib and no blood in stool. She was admitted to the hospital in January with GI bleeding was found to have atrial fibrillation. Workup at that time revealed what appeared to be ischemic colitis. Her aspirin was held for a week. She improved and has not had any further bleeding. We have been able to restart Coumadin she's not had any bleeding. The bruising that she was having is now better since she has been off the aspirin.  She has a nonischemic cardiomyopathy. Echocardiogram in January  revealed an ejection fraction of 35-40%. She has moderate pulmonary hypertension with an estimated PA pressure of 49. I have reviewed the echo and the results are noted below. Left ventricle: The cavity size was normal. Wall thickness was increased in a pattern of mild LVH. Systolic function was moderately reduced. The estimated ejection fraction was in the range of 35% to 40%. Diffuse hypokinesis. - Mitral valve: Mild regurgitation. - Left atrium: The atrium was mildly dilated. - Right ventricle: The cavity size was mildly dilated. Systolic function was mildly reduced. - Right atrium: The atrium was mildly dilated. - Pulmonary arteries: Systolic pressure was moderately increased. PA peak pressure: 17mm Hg   She continues to have generalized fatigue and malaise.  She has chronic renal insufficiency.  Oct. 23, 2014:  Becky Forbes is in atrial flutter today. No symptoms. breathing is ok. She walks on occasion. [name] not able to tell if her heart rate is irregular or not.  August 03, 2013:   Becky Forbes is doing well. No Cp or dyspnea. Perhaps mild DOE with walking  Echo April 2015 showed improved LV function.  Left ventricle: The cavity size was normal. Systolic function was mildly to moderately reduced. The estimated ejection fraction was in the range of 40% to 45%. Severe hypokinesis of the basal-midinferolateral and inferior myocardium. The study is not technically sufficient to allow evaluation of LV diastolic function. - Mitral valve: Calcified annulus. Mild to moderate regurgitation directed centrally. - Left atrium:  The atrium was severely dilated. - Right atrium: The atrium was moderately dilated  September 22, 2013: Becky Forbes is feeling a little bit better. She seems to be tolerating the amiodarone better than the high dose of metoprolol. [name] not having episodes of chest pain. She thinks her heart rate is better.  Her blood pressure here in the office is a little bit elevated. Her  blood pressures at home have been all in the normal range.  Sept. 10, 2015:  Becky Forbes is feeling quite well. She has not noticed any heart rate irregularities. She has felt a lot better since decreasing the dose of amiodarone to 200 mg a day.   Feb. 25, 2016:   Becky Forbes is a 76 y.o. female who presents for follow-up of her atrial fibrillation. Since I last saw her she's had a pacemaker placed.  She is feeling well.  Has some tenderness and " lumpyness around the pacer for the past week.    still has some dizziness.   12/13/2014: Still has lack of energy  Fatigues with walking .    06/13/2014:  Becky Forbes presents today following a hospitalization for GI bleed. She was admitted on September 21 with an acute GI bleed. This was despite the fact that she had been off her Coumadin for the previous 7 days and her INR was only 1.8. She was stabilized. She is seen back today.  [name] feeling better and has not had any further bleeding.  She has maintained NSR.    Jan. 27, 2017:  Doing ok from a cardiac standpoint. She fell over the Christmas holidays - tripped over some  broken pavement .  BP has been normal at home.   Slightly elevated here - she has not had her meds this am . She had moderate carotid artery disease by duplex scan in 2010. [name] due for a repeat duplex scan. [name] not having any neurologic symptoms. She denies any chest pain or shortness of breath. She does not get much exercise. She is limited by back pain  October 25, 2015:    No CP or dyspnea  BP is a bit high. Trying to watch her diet.   No recent episodes of atrial fib   July 08, 2016:  Becky Forbes is seen today for follow up of her AFib .   Oct. 22, 2018:  Feels better on the lower dose of amio.( takes amio 20 mg 3 times a week)  Still has fatigue , No CP ,   No palpitations  Past Medical History:  Diagnosis Date  . Anemia    hx  . Arthritis   . Carotid artery disease ([place])    a. Carotid duplex in 2010: 76-16%  RICA, 07-37% LICA.  Marland Kitchen Chronic combined systolic and diastolic CHF (congestive heart failure) ([place])   . CKD (chronic kidney disease), stage III ([place])    no nephrologist   . Complication of anesthesia   . Diabetes mellitus    x 10 yrs  . GI bleed    a. 04/2012 - lower GIB - felt 2/2 mild ischemic colitis as shown on colonoscopy, was cleared for anticoag 1 week out from procedure.  Marland Kitchen Heart murmur    hx  . HTN (hypertension)   . Hyperlipemia   . Hypothyroidism   . Ischemic colitis ([place])    a. 04/2012 - lower GIB - felt 2/2 mild ischemic colitis as shown on colonoscopy..  . PAF (paroxysmal atrial fibrillation) ([place])   . PONV (postoperative  nausea and vomiting)   . 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 Renal insufficiency   . Sick sinus syndrome ([place])   . Symptomatic sinus bradycardia    a. [place] DR  dual-chamber pacemaker 03/2014 by Dr. Rayann Heman.  . Typical atrial flutter [facility])     Past Surgical History:  Procedure Laterality Date  . ABDOMINAL HYSTERECTOMY    . APPENDECTOMY    . BACK SURGERY    . CARPAL TUNNEL RELEASE Right   . CATARACT EXTRACTION  05/25/2007   RIGHT EYE,left  . COLONOSCOPY  01/31/2009   small external/internal hemorrhoids, diverticulosis in sigmoid colon, one small polyp (biopsied). Biospy essentially normal. Dr.Magod  . COLONOSCOPY  04/30/2012   Procedure: COLONOSCOPY;  Surgeon: Cleotis Nipper, MD;  Location: [facility] ENDOSCOPY;  Service: Endoscopy;  Laterality: N/A;  unprepped, poss flex only  . FOOT SURGERY Left 2006   MID FOOT RECONSTRUCTION  . GANGLION CYST EXCISION Right 12/2007   Dr.Gramig hand  . JOINT REPLACEMENT     bilateral  . LUMBAR LAMINECTOMY     x3 one fusion  . OSTEOTOMY Left 2006   AND FUSION  . PARS PLANA VITRECTOMY W/ REPAIR OF MACULAR HOLE Right 2010  . PERMANENT PACEMAKER INSERTION N/A 03/29/2014   STJ Assurity dual chamber paceamker implnated by Dr Rayann Heman  . TOTAL KNEE ARTHROPLASTY Bilateral 05/02/08, 07/25/08    Dr.Alusio     Current Outpatient Prescriptions  Medication Sig Dispense Refill  . acetaminophen (TYLENOL) 325 MG tablet Take 650 mg by mouth every 6 (six) hours as needed for moderate pain.    Marland Kitchen amiodarone (PACERONE) 200 MG tablet TAKE 1 TABLET BY MOUTH  DAILY 90 tablet 1  . atorvastatin (LIPITOR) 20 MG tablet TAKE 1 TABLET BY MOUTH  DAILY 90 tablet 1  . bromocriptine (PARLODEL) 2.5 MG tablet TAKE 1 TABLET BY MOUTH  DAILY 90 tablet 3  . cholecalciferol (VITAMIN D) 400 units TABS tablet Take 800 Units by mouth daily.    . furosemide (LASIX) 20 MG tablet TAKE 1 TABLET BY MOUTH  DAILY AS NEEDED 90 tablet 1  . glucose blood (BAYER CONTOUR NEXT TEST) test strip 1 each by Other route daily. And lancets 1/day 100 each 12  . JANUVIA 100 MG tablet TAKE ONE-HALF TABLET BY  MOUTH DAILY 45 tablet 2  . levothyroxine (SYNTHROID, LEVOTHROID) 88 MCG tablet TAKE 1 TABLET BY MOUTH  DAILY 90 tablet 0  . metoprolol tartrate (LOPRESSOR) 50 MG tablet Take 1 tablet (50 mg total) by mouth 2 (two) times daily. 180 tablet 1  . Multiple Vitamin (MULITIVITAMIN WITH MINERALS) TABS Take 1 tablet by mouth daily.    . ramipril (ALTACE) 5 MG capsule TAKE 1 CAPSULE BY MOUTH TWO TIMES DAILY 180 capsule 3  . repaglinide (PRANDIN) 2 MG tablet TAKE 1 TABLET BY MOUTH 3  TIMES A DAY BEFORE MEALS 270 tablet 2   No current facility-administered medications for this visit.     Allergies:   Morphine and related; Januvia [sitagliptin phosphate]; Actos [pioglitazone hydrochloride]; Hydrocodone-acetaminophen; Keflex [cephalexin]; and Percocet [oxycodone-acetaminophen]    Social History:  The patient  reports that she has never smoked. She has never used smokeless tobacco. She reports that she does not drink alcohol or use drugs.   Family History:  The patient's family history includes Coronary artery disease in her father; Diabetes in her brother, father, and sister; Hypertension in her father and mother; Kidney failure in her  mother and sister; Other in  her brother; Stroke in her father.    ROS:  Please see the history of present illness.    Review of Systems: Constitutional:  denies fever, chills, diaphoresis, appetite change and fatigue.  HEENT: denies photophobia, eye pain, redness, hearing loss, ear pain, congestion, sore throat, rhinorrhea, sneezing, neck pain, neck stiffness and tinnitus.  Respiratory: denies SOB, DOE, cough, chest tightness, and wheezing.  Cardiovascular: denies chest pain, palpitations and leg swelling.  Gastrointestinal: denies nausea, vomiting, abdominal pain, diarrhea, constipation, blood in stool.  Genitourinary: denies dysuria, urgency, frequency, hematuria, flank pain and difficulty urinating.  Musculoskeletal: denies  myalgias, back pain, joint swelling, arthralgias and gait problem.   Skin: denies pallor, rash and wound.  Neurological: denies dizziness, seizures, syncope, weakness, light-headedness, numbness and headaches.   Hematological: denies adenopathy, easy bruising, personal or family bleeding history.  Psychiatric/ Behavioral: denies suicidal ideation, mood changes, confusion, nervousness, sleep disturbance and agitation.   All other systems are reviewed and negative.   Physical Exam: Blood pressure 124/76, pulse 60, height 5\' 4"  (1.626 m), weight 170 lb (77.1 kg), SpO2 99 %.  GEN:  Well nourished, well developed in no acute distress HEENT: Normal NECK: No JVD; No carotid bruits LYMPHATICS: No lymphadenopathy CARDIAC: RRR , no murmurs, rubs, gallops RESPIRATORY:  Clear to auscultation without rales, wheezing or rhonchi  ABDOMEN: Soft, non-tender, non-distended MUSCULOSKELETAL:  No edema; No deformity  SKIN: Warm and dry NEUROLOGIC:  Alert and oriented x 3   EKG:  EKG is ordered today. Oct. 22, 2018:    atrial pacing at 60 .  NS ST ab.   Recent Labs: 07/02/2016: ALT 37; BUN 22; Creatinine, Ser 1.11; Hemoglobin 11.9; Platelets 261.0; Potassium 3.9; Sodium  138; TSH 1.37   Lipid Panel    Component Value Date/Time   CHOL 117 (L) 08/10/2015 0736   TRIG 65 08/10/2015 0736   TRIG 108 04/21/2006 1234   HDL 38 (L) 08/10/2015 0736   CHOLHDL 3.1 08/10/2015 0736   VLDL 13 08/10/2015 0736   LDLCALC 66 08/10/2015 0736      Wt Readings from Last 3 Encounters:  02/03/17 170 lb (77.1 kg)  11/07/16 170 lb (77.1 kg)  07/18/16 183 lb 6 oz (83.2 kg)      Other studies Reviewed: Additional studies/ records that were reviewed today include: . Review of the above records demonstrates:    ASSESSMENT AND PLAN:  1. Atrial fibrillation / atrial Flutter - paroxysmal - she is maintainin NSR , on  amiodarone to 200 mg  3 times a week  Seems to be controlling her well Will reduce metoprolol to 25 mg BID ( which may help her fatigue )    2. Diabetes mellitus 3. Hypertension - BP is well controlled.   4. Hyperlipidemia -    Will check labs today  , continue atorva   5. Chronic systolic CHF - continue current meds  6. S/p pacer :    She is doing well   7 carotid artery disease:  Has mild carotid artery disease.   Current medicines are reviewed at length with the patient today.  The patient does not have concerns regarding medicines.  The following changes have been made:  no change   Disposition:   FU with me in 6 months     Signed, Mertie Moores, MD  02/03/2017 10:20 AM    [facility] [address], [place], [place]  [address] Phone: [phone]; Fax: [phone]

## 2017-02-03 NOTE — Patient Instructions (Signed)
Medication Instructions:  DECREASE Lopressor (Metoprolol) to 25 mg twice daily   Labwork: TODAY - cholesterol, liver panel, basic metabolic panel, TSH   Testing/Procedures: None Ordered   Follow-Up: Your physician wants you to follow-up in: 6 months with Dr. Acie Fredrickson.  You will receive a reminder letter in the mail two months in advance. If you don't receive a letter, please call our office to schedule the follow-up appointment.   If you need a refill on your cardiac medications before your next appointment, please call your pharmacy.   Thank you for choosing [facility]! Christen Bame, RN [phone]

## 2017-02-12 DIAGNOSIS — Z1231 Encounter for screening mammogram for malignant neoplasm of breast: Secondary | ICD-10-CM | POA: Diagnosis not present

## 2017-02-12 LAB — HM MAMMOGRAPHY

## 2017-02-18 ENCOUNTER — Encounter: Payer: Self-pay | Admitting: Internal Medicine

## 2017-02-20 ENCOUNTER — Other Ambulatory Visit: Payer: Self-pay | Admitting: Endocrinology

## 2017-02-25 ENCOUNTER — Other Ambulatory Visit: Payer: Self-pay | Admitting: Emergency Medicine

## 2017-02-25 MED ORDER — LEVOTHYROXINE SODIUM 88 MCG PO TABS: 88.0000 ug | ORAL_TABLET | Freq: Every day | ORAL | 1 refills | Status: AC

## 2017-03-14 ENCOUNTER — Ambulatory Visit: Payer: Medicare Other | Admitting: Podiatry

## 2017-04-17 ENCOUNTER — Ambulatory Visit (INDEPENDENT_AMBULATORY_CARE_PROVIDER_SITE_OTHER): Payer: Medicare Other | Admitting: *Deleted

## 2017-04-17 DIAGNOSIS — I495 Sick sinus syndrome: Secondary | ICD-10-CM

## 2017-04-17 NOTE — Progress Notes (Signed)
Remote pacemaker transmission.   

## 2017-04-18 ENCOUNTER — Encounter: Payer: Self-pay | Admitting: Cardiology

## 2017-05-02 LAB — CUP PACEART REMOTE DEVICE CHECK
Battery Remaining Longevity: 109 mo
Battery Remaining Percentage: 95.5 %
Battery Voltage: 2.98 V
Brady Statistic AS VP Percent: 1 %
Brady Statistic RA Percent Paced: 97 %
Date Time Interrogation Session: 20190103070012
Implantable Lead Implant Date: 20151215
Implantable Lead Implant Date: 20151215
Implantable Lead Location: 753859
Implantable Lead Model: 1948
Implantable Pulse Generator Implant Date: 20151215
Lead Channel Impedance Value: 340 Ohm
Lead Channel Pacing Threshold Amplitude: 0.5 V
Lead Channel Pacing Threshold Pulse Width: 0.5 ms
Lead Channel Sensing Intrinsic Amplitude: 0.5 mV
Lead Channel Sensing Intrinsic Amplitude: 12 mV
Lead Channel Setting Pacing Amplitude: 2 V
MDC IDC LEAD LOCATION: 753860
MDC IDC MSMT LEADCHNL RV IMPEDANCE VALUE: 740 Ohm
MDC IDC MSMT LEADCHNL RV PACING THRESHOLD AMPLITUDE: 0.5 V
MDC IDC MSMT LEADCHNL RV PACING THRESHOLD PULSEWIDTH: 0.4 ms
MDC IDC SET LEADCHNL RV PACING AMPLITUDE: 2.5 V
MDC IDC SET LEADCHNL RV PACING PULSEWIDTH: 0.4 ms
MDC IDC SET LEADCHNL RV SENSING SENSITIVITY: 2 mV
MDC IDC STAT BRADY AP VP PERCENT: 11 %
MDC IDC STAT BRADY AP VS PERCENT: 88 %
MDC IDC STAT BRADY AS VS PERCENT: 1 %
MDC IDC STAT BRADY RV PERCENT PACED: 13 %
Pulse Gen Model: 2240
Pulse Gen Serial Number: 7694159

## 2017-05-06 DIAGNOSIS — Z961 Presence of intraocular lens: Secondary | ICD-10-CM | POA: Diagnosis not present

## 2017-05-06 DIAGNOSIS — H353131 Nonexudative age-related macular degeneration, bilateral, early dry stage: Secondary | ICD-10-CM | POA: Diagnosis not present

## 2017-05-09 ENCOUNTER — Encounter: Payer: Self-pay | Admitting: Endocrinology

## 2017-05-09 ENCOUNTER — Ambulatory Visit (INDEPENDENT_AMBULATORY_CARE_PROVIDER_SITE_OTHER): Payer: Medicare Other | Admitting: Endocrinology

## 2017-05-09 VITALS — BP 117/92 | HR 79 | Wt 168.4 lb

## 2017-05-09 DIAGNOSIS — E1122 Type 2 diabetes mellitus with diabetic chronic kidney disease: Secondary | ICD-10-CM

## 2017-05-09 DIAGNOSIS — N183 Chronic kidney disease, stage 3 (moderate): Secondary | ICD-10-CM

## 2017-05-09 LAB — POCT GLYCOSYLATED HEMOGLOBIN (HGB A1C): Hemoglobin A1C: 7.3

## 2017-05-09 MED ORDER — REPAGLINIDE 2 MG PO TABS: ORAL_TABLET | ORAL | 2 refills | Status: DC

## 2017-05-09 NOTE — Patient Instructions (Addendum)
check your blood sugar once a day.  vary the time of day when you check, between before the 3 meals, and at bedtime.  also check if you have symptoms of your blood sugar being too high or too low.  please keep a record of the readings and bring it to your next appointment here.  please call us sooner if your blood sugar goes below 70, or if it stays over 200.  please change the repaglinide to 1 1/2 tabs with breakfast, 1 1/2 with lunch, and 1/2 with supper Please continue the same other medications for diabetes.   Please come back for a follow-up appointment in 6 months.

## 2017-05-09 NOTE — Progress Notes (Signed)
Subjective:    Patient ID: Becky Forbes, female    DOB: 1941-03-17, 77 y.o.   MRN: [id number]  HPI Pt returns for f/u of diabetes mellitus: DM type: 2 Dx'ed: 1601 Complications: PAD, renal insufficiency,and retinopathy.  Therapy: 3 oral meds.  GDM: never.  DKA: never.  Severe hypoglycemia: never.   Pancreatitis: never.  Other: renal insuff limits rx options; she had edema on pioglitizone, and diarrhea on acarbose; she could not afford invokana.  She stopped welchol due to lack of effect.  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 Interval hx: pt states she feels well in general.  She takes meds as rx'ed.  no cbg record, but states cbg's vary from 59-200.  It is in general highest in the afternoon Past Medical History:  Diagnosis Date  . Anemia    hx  . Arthritis   . Carotid artery disease ([place])    a. Carotid duplex in 2010: 09-32% RICA, 35-57% LICA.  Marland Kitchen Chronic combined systolic and diastolic CHF (congestive heart failure) ([place])   . CKD (chronic kidney disease), stage III ([place])    no nephrologist   . Complication of anesthesia   . Diabetes mellitus    x 10 yrs  . GI bleed    a. 04/2012 - lower GIB - felt 2/2 mild ischemic colitis as shown on colonoscopy, was cleared for anticoag 1 week out from procedure.  Marland Kitchen Heart murmur    hx  . HTN (hypertension)   . Hyperlipemia   . Hypothyroidism   . Ischemic colitis ([place])    a. 04/2012 - lower GIB - felt 2/2 mild ischemic colitis as shown on colonoscopy..  . PAF (paroxysmal atrial fibrillation) ([place])   . PONV (postoperative nausea and vomiting)   . 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 Renal insufficiency   . Sick sinus syndrome ([place])   . Symptomatic sinus bradycardia    a. [place] DR  dual-chamber pacemaker 03/2014 by Dr. Rayann Heman.  . Typical atrial flutter [facility])     Past Surgical History:  Procedure Laterality Date  . ABDOMINAL HYSTERECTOMY    . APPENDECTOMY    . BACK  SURGERY    . CARPAL TUNNEL RELEASE Right   . CATARACT EXTRACTION  05/25/2007   RIGHT EYE,left  . COLONOSCOPY  01/31/2009   small external/internal hemorrhoids, diverticulosis in sigmoid colon, one small polyp (biopsied). Biospy essentially normal. Dr.Magod  . COLONOSCOPY  04/30/2012   Procedure: COLONOSCOPY;  Surgeon: Cleotis Nipper, MD;  Location[facility] ENDOSCOPY;  Service: Endoscopy;  Laterality: N/A;  unprepped, poss flex only  . FOOT SURGERY Left 2006   MID FOOT RECONSTRUCTION  . GANGLION CYST EXCISION Right 12/2007   Dr.Gramig hand  . JOINT REPLACEMENT     bilateral  . LUMBAR LAMINECTOMY     x3 one fusion  . OSTEOTOMY Left 2006   AND FUSION  . PARS PLANA VITRECTOMY W/ REPAIR OF MACULAR HOLE Right 2010  . PERMANENT PACEMAKER INSERTION N/A 03/29/2014   STJ Assurity dual chamber paceamker implnated by Dr Rayann Heman  . TOTAL KNEE ARTHROPLASTY Bilateral 05/02/08, 07/25/08   Dr.Alusio    Social History   Socioeconomic History  . Marital status: Married    Spouse name: JAMES  . Number of children: 2  . Years of education: Not on file  . Highest education level: Not on file  Social Needs  . Financial resource strain: Not on file  .  Food insecurity - worry: Not on file  . Food insecurity - inability: Not on file  . Transportation needs - medical: Not on file  . Transportation needs - non-medical: Not on file  Occupational History  . Occupation: RETIRED RN    Employer: [employer]    Comment: RETIRED IN 2007  Tobacco Use  . Smoking status: Never Smoker  . Smokeless tobacco: Never Used  Substance and Sexual Activity  . Alcohol use: No  . Drug use: No  . Sexual activity: Not Currently  Other Topics Concern  . Not on file  Social History Narrative  . Not on file    Current Outpatient Medications on File Prior to Visit  Medication Sig Dispense Refill  . acetaminophen (TYLENOL) 325 MG tablet Take 650 mg by mouth every 6 (six) hours as needed for moderate pain.      Marland Kitchen amiodarone (PACERONE) 200 MG tablet Take 200 mg by mouth 3 (three) times a week.    Marland Kitchen atorvastatin (LIPITOR) 20 MG tablet TAKE 1 TABLET BY MOUTH  DAILY 90 tablet 1  . bromocriptine (PARLODEL) 2.5 MG tablet TAKE 1 TABLET BY MOUTH  DAILY 90 tablet 3  . cholecalciferol (VITAMIN D) 400 units TABS tablet Take 800 Units by mouth daily.    . furosemide (LASIX) 20 MG tablet TAKE 1 TABLET BY MOUTH  DAILY AS NEEDED 90 tablet 1  . glucose blood (BAYER CONTOUR NEXT TEST) test strip 1 each by Other route daily. And lancets 1/day 100 each 12  . JANUVIA 100 MG tablet TAKE ONE-HALF TABLET BY  MOUTH DAILY 45 tablet 2  . levothyroxine (SYNTHROID, LEVOTHROID) 88 MCG tablet Take 1 tablet (88 mcg total) daily by mouth. 90 tablet 1  . Multiple Vitamin (MULITIVITAMIN WITH MINERALS) TABS Take 1 tablet by mouth daily.    . ramipril (ALTACE) 5 MG capsule TAKE 1 CAPSULE BY MOUTH TWO TIMES DAILY 180 capsule 3  . metoprolol tartrate (LOPRESSOR) 25 MG tablet Take 1 tablet (25 mg total) by mouth 2 (two) times daily. 180 tablet 3   No current facility-administered medications on file prior to visit.     Allergies  Allergen Reactions  . Morphine And Related Itching, Nausea And Vomiting, Rash and Other (See Comments)    01/04/15- patient says she is NOT allergic to morphine.  Morphine IV given this AM 01/04/15 in the ED. No adverse reaction.  Celesta Gentile [Sitagliptin Phosphate] Other (See Comments)    Pt says this caused elevated liver enzymes and creatinine. IS TAKING 1/2 TABLET  . Actos [Pioglitazone Hydrochloride] Swelling    UNSPECIFIED REACTION   . Hydrocodone-Acetaminophen Itching, Nausea And Vomiting and Other (See Comments)    Per pt 05/12/15 she had this recently in the hospital with no issues at all  . Keflex [Cephalexin] Rash and Other (See Comments)    Burning sensation all over  . Percocet [Oxycodone-Acetaminophen] Nausea And Vomiting    Family History  Problem Relation Age of Onset  . Coronary artery  disease Father   . Hypertension Father   . Stroke Father   . Diabetes Father   . Kidney failure Mother   . Hypertension Mother   . Other Brother        SEPSIS  . Diabetes Brother   . Diabetes Sister   . Kidney failure Sister     BP (!) 117/92 (BP Location: Left Arm, Patient Position: Sitting, Cuff Size: Normal)   Pulse 79   Wt 168 lb  6.4 oz (76.4 kg)   SpO2 98%   BMI 28.91 kg/m    Review of Systems Denies LOC    Objective:   Physical Exam VITAL SIGNS:  See vs page GENERAL: no distress Pulses: foot pulses are intact bilaterally.   MSK: no deformity of the feet or ankles.  CV: no edema of the legs.  Skin:  no ulcer on the feet or ankles.  normal color and temp on the feet and ankles Neuro: sensation is intact to touch on the feet and ankles.   Ext: There is bilateral onychomycosis of the toenails  Lab Results  Component Value Date   HGBA1C 7.3 05/09/2017      Assessment & Plan:  type 2 DM: Based on the pattern of her cbg's, she needs some adjustment in her therapy Renal insuff: this limits rx options Hypoglycemia: this limits aggressiveness of glycemic control  Patient Instructions  check your blood sugar once a day.  vary the time of day when you check, between before the 3 meals, and at bedtime.  also check if you have symptoms of your blood sugar being too high or too low.  please keep a record of the readings and bring it to your next appointment here.  please call us sooner if your blood sugar goes below 70, or if it stays over 200.  please change the repaglinide to 1 1/2 tabs with breakfast, 1 1/2 with lunch, and 1/2 with supper Please continue the same other medications for diabetes.   Please come back for a follow-up appointment in 6 months.

## 2017-05-19 DIAGNOSIS — H31009 Unspecified chorioretinal scars, unspecified eye: Secondary | ICD-10-CM | POA: Diagnosis not present

## 2017-05-19 DIAGNOSIS — H43812 Vitreous degeneration, left eye: Secondary | ICD-10-CM | POA: Diagnosis not present

## 2017-05-19 DIAGNOSIS — E113492 Type 2 diabetes mellitus with severe nonproliferative diabetic retinopathy without macular edema, left eye: Secondary | ICD-10-CM | POA: Diagnosis not present

## 2017-05-19 DIAGNOSIS — E113491 Type 2 diabetes mellitus with severe nonproliferative diabetic retinopathy without macular edema, right eye: Secondary | ICD-10-CM | POA: Diagnosis not present

## 2017-06-02 ENCOUNTER — Encounter: Payer: Self-pay | Admitting: Internal Medicine

## 2017-06-02 ENCOUNTER — Encounter (HOSPITAL_COMMUNITY): Payer: Self-pay | Admitting: Emergency Medicine

## 2017-06-02 ENCOUNTER — Ambulatory Visit (INDEPENDENT_AMBULATORY_CARE_PROVIDER_SITE_OTHER): Payer: Medicare Other | Admitting: Internal Medicine

## 2017-06-02 ENCOUNTER — Emergency Department (HOSPITAL_COMMUNITY): Payer: Medicare Other

## 2017-06-02 ENCOUNTER — Encounter (HOSPITAL_COMMUNITY): Payer: Self-pay | Admitting: Internal Medicine

## 2017-06-02 ENCOUNTER — Inpatient Hospital Stay (HOSPITAL_COMMUNITY)
Admission: EM | Admit: 2017-06-02 | Discharge: 2017-06-13 | DRG: 871 | Disposition: A | Discharge status: Other Acute Inpatient Hospital | Payer: Medicare Other | Attending: Internal Medicine | Admitting: Internal Medicine

## 2017-06-02 ENCOUNTER — Other Ambulatory Visit: Payer: Self-pay

## 2017-06-02 VITALS — BP 144/80 | HR 118 | Temp 98.9°F | Resp 18 | Wt 163.0 lb

## 2017-06-02 DIAGNOSIS — I4819 Other persistent atrial fibrillation: Secondary | ICD-10-CM

## 2017-06-02 DIAGNOSIS — Z8249 Family history of ischemic heart disease and other diseases of the circulatory system: Secondary | ICD-10-CM | POA: Diagnosis not present

## 2017-06-02 DIAGNOSIS — Z841 Family history of disorders of kidney and ureter: Secondary | ICD-10-CM

## 2017-06-02 DIAGNOSIS — E1122 Type 2 diabetes mellitus with diabetic chronic kidney disease: Secondary | ICD-10-CM | POA: Diagnosis present

## 2017-06-02 DIAGNOSIS — Z9841 Cataract extraction status, right eye: Secondary | ICD-10-CM | POA: Diagnosis not present

## 2017-06-02 DIAGNOSIS — Z950 Presence of cardiac pacemaker: Secondary | ICD-10-CM

## 2017-06-02 DIAGNOSIS — T462X1D Poisoning by other antidysrhythmic drugs, accidental (unintentional), subsequent encounter: Secondary | ICD-10-CM | POA: Diagnosis not present

## 2017-06-02 DIAGNOSIS — Z9842 Cataract extraction status, left eye: Secondary | ICD-10-CM

## 2017-06-02 DIAGNOSIS — N189 Chronic kidney disease, unspecified: Secondary | ICD-10-CM | POA: Diagnosis present

## 2017-06-02 DIAGNOSIS — I428 Other cardiomyopathies: Secondary | ICD-10-CM | POA: Diagnosis present

## 2017-06-02 DIAGNOSIS — J181 Lobar pneumonia, unspecified organism: Secondary | ICD-10-CM | POA: Diagnosis not present

## 2017-06-02 DIAGNOSIS — T502X5A Adverse effect of carbonic-anhydrase inhibitors, benzothiadiazides and other diuretics, initial encounter: Secondary | ICD-10-CM | POA: Diagnosis not present

## 2017-06-02 DIAGNOSIS — Z833 Family history of diabetes mellitus: Secondary | ICD-10-CM

## 2017-06-02 DIAGNOSIS — J9600 Acute respiratory failure, unspecified whether with hypoxia or hypercapnia: Secondary | ICD-10-CM | POA: Diagnosis present

## 2017-06-02 DIAGNOSIS — Z90710 Acquired absence of both cervix and uterus: Secondary | ICD-10-CM | POA: Diagnosis not present

## 2017-06-02 DIAGNOSIS — J9601 Acute respiratory failure with hypoxia: Secondary | ICD-10-CM | POA: Diagnosis not present

## 2017-06-02 DIAGNOSIS — Z79890 Hormone replacement therapy (postmenopausal): Secondary | ICD-10-CM

## 2017-06-02 DIAGNOSIS — R0602 Shortness of breath: Secondary | ICD-10-CM

## 2017-06-02 DIAGNOSIS — I5023 Acute on chronic systolic (congestive) heart failure: Secondary | ICD-10-CM | POA: Diagnosis not present

## 2017-06-02 DIAGNOSIS — Z981 Arthrodesis status: Secondary | ICD-10-CM | POA: Diagnosis not present

## 2017-06-02 DIAGNOSIS — J80 Acute respiratory distress syndrome: Secondary | ICD-10-CM | POA: Diagnosis present

## 2017-06-02 DIAGNOSIS — Z96653 Presence of artificial knee joint, bilateral: Secondary | ICD-10-CM | POA: Diagnosis present

## 2017-06-02 DIAGNOSIS — A419 Sepsis, unspecified organism: Principal | ICD-10-CM

## 2017-06-02 DIAGNOSIS — Z885 Allergy status to narcotic agent status: Secondary | ICD-10-CM

## 2017-06-02 DIAGNOSIS — R0902 Hypoxemia: Secondary | ICD-10-CM

## 2017-06-02 DIAGNOSIS — E785 Hyperlipidemia, unspecified: Secondary | ICD-10-CM | POA: Diagnosis present

## 2017-06-02 DIAGNOSIS — I2510 Atherosclerotic heart disease of native coronary artery without angina pectoris: Secondary | ICD-10-CM | POA: Diagnosis present

## 2017-06-02 DIAGNOSIS — E1165 Type 2 diabetes mellitus with hyperglycemia: Secondary | ICD-10-CM | POA: Diagnosis not present

## 2017-06-02 DIAGNOSIS — I110 Hypertensive heart disease with heart failure: Secondary | ICD-10-CM | POA: Diagnosis not present

## 2017-06-02 DIAGNOSIS — J8410 Pulmonary fibrosis, unspecified: Secondary | ICD-10-CM | POA: Diagnosis present

## 2017-06-02 DIAGNOSIS — J189 Pneumonia, unspecified organism: Secondary | ICD-10-CM

## 2017-06-02 DIAGNOSIS — I480 Paroxysmal atrial fibrillation: Secondary | ICD-10-CM

## 2017-06-02 DIAGNOSIS — R52 Pain, unspecified: Secondary | ICD-10-CM | POA: Insufficient documentation

## 2017-06-02 DIAGNOSIS — R079 Chest pain, unspecified: Secondary | ICD-10-CM | POA: Diagnosis not present

## 2017-06-02 DIAGNOSIS — E782 Mixed hyperlipidemia: Secondary | ICD-10-CM | POA: Diagnosis present

## 2017-06-02 DIAGNOSIS — N183 Chronic kidney disease, stage 3 (moderate): Secondary | ICD-10-CM | POA: Diagnosis not present

## 2017-06-02 DIAGNOSIS — I509 Heart failure, unspecified: Secondary | ICD-10-CM

## 2017-06-02 DIAGNOSIS — E872 Acidosis: Secondary | ICD-10-CM | POA: Diagnosis present

## 2017-06-02 DIAGNOSIS — I5043 Acute on chronic combined systolic (congestive) and diastolic (congestive) heart failure: Secondary | ICD-10-CM | POA: Diagnosis not present

## 2017-06-02 DIAGNOSIS — I10 Essential (primary) hypertension: Secondary | ICD-10-CM | POA: Diagnosis not present

## 2017-06-02 DIAGNOSIS — Z7984 Long term (current) use of oral hypoglycemic drugs: Secondary | ICD-10-CM | POA: Diagnosis not present

## 2017-06-02 DIAGNOSIS — E876 Hypokalemia: Secondary | ICD-10-CM | POA: Diagnosis not present

## 2017-06-02 DIAGNOSIS — M1990 Unspecified osteoarthritis, unspecified site: Secondary | ICD-10-CM | POA: Diagnosis present

## 2017-06-02 DIAGNOSIS — J470 Bronchiectasis with acute lower respiratory infection: Secondary | ICD-10-CM | POA: Diagnosis present

## 2017-06-02 DIAGNOSIS — Z86010 Personal history of colonic polyps: Secondary | ICD-10-CM

## 2017-06-02 DIAGNOSIS — T462X5A Adverse effect of other antidysrhythmic drugs, initial encounter: Secondary | ICD-10-CM | POA: Diagnosis present

## 2017-06-02 DIAGNOSIS — E032 Hypothyroidism due to medicaments and other exogenous substances: Secondary | ICD-10-CM | POA: Diagnosis not present

## 2017-06-02 DIAGNOSIS — I5021 Acute systolic (congestive) heart failure: Secondary | ICD-10-CM | POA: Diagnosis not present

## 2017-06-02 DIAGNOSIS — J168 Pneumonia due to other specified infectious organisms: Secondary | ICD-10-CM | POA: Diagnosis not present

## 2017-06-02 DIAGNOSIS — I5042 Chronic combined systolic (congestive) and diastolic (congestive) heart failure: Secondary | ICD-10-CM | POA: Diagnosis not present

## 2017-06-02 DIAGNOSIS — Z823 Family history of stroke: Secondary | ICD-10-CM

## 2017-06-02 DIAGNOSIS — I481 Persistent atrial fibrillation: Secondary | ICD-10-CM | POA: Diagnosis present

## 2017-06-02 DIAGNOSIS — Z881 Allergy status to other antibiotic agents status: Secondary | ICD-10-CM

## 2017-06-02 DIAGNOSIS — I361 Nonrheumatic tricuspid (valve) insufficiency: Secondary | ICD-10-CM | POA: Diagnosis not present

## 2017-06-02 DIAGNOSIS — T462X1A Poisoning by other antidysrhythmic drugs, accidental (unintentional), initial encounter: Secondary | ICD-10-CM | POA: Diagnosis not present

## 2017-06-02 DIAGNOSIS — R918 Other nonspecific abnormal finding of lung field: Secondary | ICD-10-CM | POA: Diagnosis not present

## 2017-06-02 DIAGNOSIS — Z888 Allergy status to other drugs, medicaments and biological substances status: Secondary | ICD-10-CM

## 2017-06-02 DIAGNOSIS — J9690 Respiratory failure, unspecified, unspecified whether with hypoxia or hypercapnia: Secondary | ICD-10-CM | POA: Diagnosis not present

## 2017-06-02 DIAGNOSIS — I6529 Occlusion and stenosis of unspecified carotid artery: Secondary | ICD-10-CM | POA: Diagnosis present

## 2017-06-02 DIAGNOSIS — R05 Cough: Secondary | ICD-10-CM | POA: Diagnosis not present

## 2017-06-02 LAB — CBC
HCT: 29.8 % — ABNORMAL LOW (ref 36.0–46.0)
HEMATOCRIT: 28.1 % — AB (ref 36.0–46.0)
HEMOGLOBIN: 9 g/dL — AB (ref 12.0–15.0)
Hemoglobin: 9.4 g/dL — ABNORMAL LOW (ref 12.0–15.0)
MCH: 29.5 pg (ref 26.0–34.0)
MCH: 29.8 pg (ref 26.0–34.0)
MCHC: 31.5 g/dL (ref 30.0–36.0)
MCHC: 32 g/dL (ref 30.0–36.0)
MCV: 93.4 fL (ref 78.0–100.0)
MCV: 93 fL (ref 78.0–100.0)
PLATELETS: 291 10*3/uL (ref 150–400)
Platelets: 281 10*3/uL (ref 150–400)
RBC: 3.19 MIL/uL — AB (ref 3.87–5.11)
RBC: 3.02 MIL/uL — ABNORMAL LOW (ref 3.87–5.11)
RDW: 14.9 % (ref 11.5–15.5)
RDW: 14.9 % (ref 11.5–15.5)
WBC: 12.1 10*3/uL — AB (ref 4.0–10.5)
WBC: 10.9 10*3/uL — ABNORMAL HIGH (ref 4.0–10.5)

## 2017-06-02 LAB — TSH: TSH: 0.405 u[IU]/mL (ref 0.350–4.500)

## 2017-06-02 LAB — BASIC METABOLIC PANEL
Anion gap: 11 (ref 5–15)
BUN: 20 mg/dL (ref 6–20)
CALCIUM: 8.5 mg/dL — AB (ref 8.9–10.3)
CO2: 24 mmol/L (ref 22–32)
CREATININE: 1.48 mg/dL — AB (ref 0.44–1.00)
Chloride: 102 mmol/L (ref 101–111)
GFR, EST AFRICAN AMERICAN: 38 mL/min — AB (ref >60)
GFR, EST NON AFRICAN AMERICAN: 33 mL/min — AB (ref >60)
Glucose, Bld: 213 mg/dL — ABNORMAL HIGH (ref 65–99)
Potassium: 4.4 mmol/L (ref 3.5–5.1)
SODIUM: 137 mmol/L (ref 135–145)

## 2017-06-02 LAB — I-STAT CG4 LACTIC ACID, ED
LACTIC ACID, VENOUS: 1.4 mmol/L (ref 0.5–1.9)
Lactic Acid, Venous: 2.5 mmol/L (ref 0.5–1.9)

## 2017-06-02 LAB — HEMOGLOBIN A1C
HEMOGLOBIN A1C: 7.6 % — AB (ref 4.8–5.6)
MEAN PLASMA GLUCOSE: 171.42 mg/dL

## 2017-06-02 LAB — I-STAT TROPONIN, ED: TROPONIN I, POC: 0.04 ng/mL (ref 0.00–0.08)

## 2017-06-02 LAB — POC INFLUENZA A&B (BINAX/QUICKVUE)
INFLUENZA A, POC: NEGATIVE
INFLUENZA B, POC: NEGATIVE

## 2017-06-02 LAB — INFLUENZA PANEL BY PCR (TYPE A & B)
Influenza A By PCR: NEGATIVE
Influenza B By PCR: NEGATIVE

## 2017-06-02 LAB — CREATININE, SERUM
CREATININE: 1.36 mg/dL — AB (ref 0.44–1.00)
GFR calc Af Amer: 42 mL/min — ABNORMAL LOW (ref >60)
GFR calc non Af Amer: 36 mL/min — ABNORMAL LOW (ref >60)

## 2017-06-02 LAB — BRAIN NATRIURETIC PEPTIDE: B NATRIURETIC PEPTIDE 5: 2340.8 pg/mL — AB (ref 0.0–100.0)

## 2017-06-02 LAB — GLUCOSE, CAPILLARY: GLUCOSE-CAPILLARY: 220 mg/dL — AB (ref 65–99)

## 2017-06-02 LAB — LACTIC ACID, PLASMA: Lactic Acid, Venous: 1.4 mmol/L (ref 0.5–1.9)

## 2017-06-02 MED ORDER — ENOXAPARIN SODIUM 40 MG/0.4ML ~~LOC~~ SOLN
40.0000 mg | SUBCUTANEOUS | Status: DC
  Administered 2017-06-02 – 2017-06-03 (×2): 40 mg via SUBCUTANEOUS
  Filled 2017-06-02 (×3): qty 0.4

## 2017-06-02 MED ORDER — ONDANSETRON HCL 4 MG PO TABS
4.0000 mg | ORAL_TABLET | Freq: Four times a day (QID) | ORAL | Status: DC | PRN
  Administered 2017-06-03: 4 mg via ORAL
  Filled 2017-06-02: qty 1

## 2017-06-02 MED ORDER — IPRATROPIUM-ALBUTEROL 0.5-2.5 (3) MG/3ML IN SOLN: 3.0000 mL | Freq: Four times a day (QID) | RESPIRATORY_TRACT | Status: DC | PRN

## 2017-06-02 MED ORDER — METOPROLOL TARTRATE 25 MG PO TABS
25.0000 mg | ORAL_TABLET | Freq: Two times a day (BID) | ORAL | Status: DC
  Administered 2017-06-02 – 2017-06-04 (×6): 25 mg via ORAL
  Filled 2017-06-02 (×6): qty 1

## 2017-06-02 MED ORDER — FUROSEMIDE 10 MG/ML IJ SOLN
40.0000 mg | Freq: Two times a day (BID) | INTRAMUSCULAR | Status: AC
  Administered 2017-06-02 – 2017-06-03 (×2): 40 mg via INTRAVENOUS
  Filled 2017-06-02 (×3): qty 4

## 2017-06-02 MED ORDER — INSULIN ASPART 100 UNIT/ML ~~LOC~~ SOLN
0.0000 [IU] | Freq: Three times a day (TID) | SUBCUTANEOUS | Status: DC
  Administered 2017-06-03: 3 [IU] via SUBCUTANEOUS
  Administered 2017-06-03: 5 [IU] via SUBCUTANEOUS
  Administered 2017-06-03: 2 [IU] via SUBCUTANEOUS
  Administered 2017-06-04: 3 [IU] via SUBCUTANEOUS
  Administered 2017-06-04: 5 [IU] via SUBCUTANEOUS
  Administered 2017-06-04: 2 [IU] via SUBCUTANEOUS
  Administered 2017-06-05 (×2): 3 [IU] via SUBCUTANEOUS
  Administered 2017-06-06: 2 [IU] via SUBCUTANEOUS
  Administered 2017-06-06: 3 [IU] via SUBCUTANEOUS
  Administered 2017-06-07: 5 [IU] via SUBCUTANEOUS
  Administered 2017-06-07: 8 [IU] via SUBCUTANEOUS
  Administered 2017-06-07: 5 [IU] via SUBCUTANEOUS
  Administered 2017-06-08: 11 [IU] via SUBCUTANEOUS
  Administered 2017-06-08: 5 [IU] via SUBCUTANEOUS
  Administered 2017-06-08: 8 [IU] via SUBCUTANEOUS
  Administered 2017-06-09: 11 [IU] via SUBCUTANEOUS
  Administered 2017-06-09: 8 [IU] via SUBCUTANEOUS
  Administered 2017-06-09: 11 [IU] via SUBCUTANEOUS
  Administered 2017-06-10: 15 [IU] via SUBCUTANEOUS
  Administered 2017-06-10: 5 [IU] via SUBCUTANEOUS
  Administered 2017-06-10: 15 [IU] via SUBCUTANEOUS
  Administered 2017-06-11: 11 [IU] via SUBCUTANEOUS
  Administered 2017-06-11: 15 [IU] via SUBCUTANEOUS

## 2017-06-02 MED ORDER — BROMOCRIPTINE MESYLATE 2.5 MG PO TABS
2.5000 mg | ORAL_TABLET | Freq: Every day | ORAL | Status: DC
  Administered 2017-06-02 – 2017-06-04 (×3): 2.5 mg via ORAL
  Filled 2017-06-02 (×5): qty 1

## 2017-06-02 MED ORDER — RAMIPRIL 5 MG PO CAPS: 5.0000 mg | ORAL_CAPSULE | Freq: Every day | ORAL | Status: DC

## 2017-06-02 MED ORDER — POLYETHYLENE GLYCOL 3350 17 G PO PACK
17.0000 g | PACK | Freq: Every day | ORAL | Status: DC | PRN
  Administered 2017-06-09 – 2017-06-10 (×2): 17 g via ORAL
  Filled 2017-06-02 (×2): qty 1

## 2017-06-02 MED ORDER — ATORVASTATIN CALCIUM 20 MG PO TABS
20.0000 mg | ORAL_TABLET | Freq: Every day | ORAL | Status: DC
  Administered 2017-06-02 – 2017-06-13 (×11): 20 mg via ORAL
  Filled 2017-06-02 (×13): qty 1

## 2017-06-02 MED ORDER — LEVOTHYROXINE SODIUM 88 MCG PO TABS
88.0000 ug | ORAL_TABLET | Freq: Every day | ORAL | Status: DC
  Administered 2017-06-02 – 2017-06-04 (×3): 88 ug via ORAL
  Filled 2017-06-02 (×4): qty 1

## 2017-06-02 MED ORDER — FUROSEMIDE 10 MG/ML IJ SOLN: 20.0000 mg | Freq: Once | INTRAMUSCULAR | Status: DC

## 2017-06-02 MED ORDER — SODIUM CHLORIDE 0.9 % IV BOLUS (SEPSIS)
500.0000 mL | Freq: Once | INTRAVENOUS | Status: AC
  Administered 2017-06-02: 500 mL via INTRAVENOUS

## 2017-06-02 MED ORDER — AMIODARONE HCL 200 MG PO TABS
200.0000 mg | ORAL_TABLET | ORAL | Status: DC
  Administered 2017-06-02 – 2017-06-04 (×2): 200 mg via ORAL
  Filled 2017-06-02 (×2): qty 1

## 2017-06-02 MED ORDER — INSULIN ASPART 100 UNIT/ML ~~LOC~~ SOLN
0.0000 [IU] | Freq: Every day | SUBCUTANEOUS | Status: DC
  Administered 2017-06-02 – 2017-06-06 (×2): 2 [IU] via SUBCUTANEOUS
  Administered 2017-06-07 – 2017-06-08 (×2): 3 [IU] via SUBCUTANEOUS
  Administered 2017-06-09 – 2017-06-10 (×2): 4 [IU] via SUBCUTANEOUS

## 2017-06-02 MED ORDER — GUAIFENESIN ER 600 MG PO TB12
1200.0000 mg | ORAL_TABLET | Freq: Two times a day (BID) | ORAL | Status: DC
  Administered 2017-06-02 – 2017-06-13 (×20): 1200 mg via ORAL
  Filled 2017-06-02 (×22): qty 2

## 2017-06-02 MED ORDER — LEVOFLOXACIN IN D5W 500 MG/100ML IV SOLN
500.0000 mg | Freq: Once | INTRAVENOUS | Status: AC
  Administered 2017-06-02: 500 mg via INTRAVENOUS
  Filled 2017-06-02: qty 100

## 2017-06-02 MED ORDER — ONDANSETRON HCL 4 MG/2ML IJ SOLN
4.0000 mg | Freq: Four times a day (QID) | INTRAMUSCULAR | Status: DC | PRN
  Administered 2017-06-05 (×2): 4 mg via INTRAVENOUS
  Filled 2017-06-02 (×2): qty 2

## 2017-06-02 MED ORDER — ACETAMINOPHEN 325 MG PO TABS
650.0000 mg | ORAL_TABLET | Freq: Four times a day (QID) | ORAL | Status: DC | PRN
  Administered 2017-06-02 – 2017-06-11 (×4): 650 mg via ORAL
  Filled 2017-06-02 (×4): qty 2

## 2017-06-02 MED ORDER — LEVOFLOXACIN IN D5W 500 MG/100ML IV SOLN
500.0000 mg | INTRAVENOUS | Status: AC
  Administered 2017-06-03 – 2017-06-07 (×5): 500 mg via INTRAVENOUS
  Filled 2017-06-02 (×5): qty 100

## 2017-06-02 NOTE — Progress Notes (Signed)
Subjective:    Patient ID: Becky Forbes, female    DOB: 1940-12-23, 77 y.o.   MRN: [id number]  HPI She is here for an acute visit for cold symptoms.  Her symptoms started 3 days ago.   She is experiencing decreased appetite, fatigue, chills, mild nasal congestion, intermittent left ear pain, chest tightness, productive cough of white phlegm, shortness of breath, wheeze, palpitations, nausea with dry heaves, body aches, headaches and lightheadedness/dizziness.  Shortness of breath started 2 days ago and has worsened.  She is not experienced any known fever.  She denies any leg swelling.  She denies any abdominal pain diarrhea or urinary symptoms.  She does have a history of paroxysmal atrial fibrillation, but has been in sinus rhythm and is not currently on an anticoagulant.  She has been feeling some palpitations over the past couple of days.  Medications and allergies reviewed with patient and updated if appropriate.  Patient Active Problem List   Diagnosis Date Noted  . Fatigue 02/03/2017  . CKD (chronic kidney disease) 07/02/2016  . Spondylolisthesis of lumbar region 01/05/2016  . Diabetes ([place]) 06/14/2015  . Fracture of rib of left side 04/07/2015  . 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 BRBPR (bright red blood per rectum) 01/04/2015  . Paroxysmal A-fib ([place]) 01/04/2015  . Colitis, acute 01/04/2015  . Benign essential HTN 01/04/2015  . History of colonic polyps 09/26/2014  . PAF (paroxysmal atrial fibrillation) ([place])   . Chronic combined systolic and diastolic CHF (congestive heart failure) ([place])   . Carotid artery disease ([place])   . Symptomatic sinus bradycardia 03/31/2014  . S/P cardiac pacemaker procedure, St. Jude device 03/31/2014  . Sick sinus syndrome ([place])   . Typical atrial flutter ([place])   . Ischemic colitis ([place]) 05/06/2012  . Diverticulosis 04/28/2012  . HYPERLIPIDEMIA 06/23/2007  . 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 Osteopenia  02/11/2007    Current Outpatient Medications on File Prior to Visit  Medication Sig Dispense Refill  . acetaminophen (TYLENOL) 325 MG tablet Take 650 mg by mouth every 6 (six) hours as needed for moderate pain.    Marland Kitchen amiodarone (PACERONE) 200 MG tablet Take 200 mg by mouth 3 (three) times a week.    Marland Kitchen atorvastatin (LIPITOR) 20 MG tablet TAKE 1 TABLET BY MOUTH  DAILY 90 tablet 1  . bromocriptine (PARLODEL) 2.5 MG tablet TAKE 1 TABLET BY MOUTH  DAILY 90 tablet 3  . cholecalciferol (VITAMIN D) 400 units TABS tablet Take 800 Units by mouth daily.    . furosemide (LASIX) 20 MG tablet TAKE 1 TABLET BY MOUTH  DAILY AS NEEDED 90 tablet 1  . glucose blood (BAYER CONTOUR NEXT TEST) test strip 1 each by Other route daily. And lancets 1/day 100 each 12  . JANUVIA 100 MG tablet TAKE ONE-HALF TABLET BY  MOUTH DAILY 45 tablet 2  . levothyroxine (SYNTHROID, LEVOTHROID) 88 MCG tablet Take 1 tablet (88 mcg total) daily by mouth. 90 tablet 1  . Multiple Vitamin (MULITIVITAMIN WITH MINERALS) TABS Take 1 tablet by mouth daily.    . ramipril (ALTACE) 5 MG capsule TAKE 1 CAPSULE BY MOUTH TWO TIMES DAILY 180 capsule 3  . repaglinide (PRANDIN) 2 MG tablet 1 1/2 tabs with breakfast, 1 1/2 with lunch, and 1/2 with supper 300 tablet 2  . metoprolol tartrate (LOPRESSOR) 25 MG tablet Take 1 tablet (25 mg total) by mouth 2 (two) times daily. 180 tablet 3   No current  facility-administered medications on file prior to visit.     Past Medical History:  Diagnosis Date  . Anemia    hx  . Arthritis   . Carotid artery disease ([place])    a. Carotid duplex in 2010: 29-92% RICA, 42-68% LICA.  Marland Kitchen Chronic combined systolic and diastolic CHF (congestive heart failure) ([place])   . CKD (chronic kidney disease), stage III ([place])    no nephrologist   . Complication of anesthesia   . Diabetes mellitus    x 10 yrs  . GI bleed    a. 04/2012 - lower GIB - felt 2/2 mild ischemic colitis as shown on colonoscopy, was cleared for anticoag 1  week out from procedure.  Marland Kitchen Heart murmur    hx  . HTN (hypertension)   . Hyperlipemia   . Hypothyroidism   . Ischemic colitis ([place])    a. 04/2012 - lower GIB - felt 2/2 mild ischemic colitis as shown on colonoscopy..  . PAF (paroxysmal atrial fibrillation) ([place])   . PONV (postoperative nausea and vomiting)   . 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 Renal insufficiency   . Sick sinus syndrome ([place])   . Symptomatic sinus bradycardia    a. [place] DR  dual-chamber pacemaker 03/2014 by Dr. Rayann Heman.  . Typical atrial flutter [facility])     Past Surgical History:  Procedure Laterality Date  . ABDOMINAL HYSTERECTOMY    . APPENDECTOMY    . BACK SURGERY    . CARPAL TUNNEL RELEASE Right   . CATARACT EXTRACTION  05/25/2007   RIGHT EYE,left  . COLONOSCOPY  01/31/2009   small external/internal hemorrhoids, diverticulosis in sigmoid colon, one small polyp (biopsied). Biospy essentially normal. Dr.Magod  . COLONOSCOPY  04/30/2012   Procedure: COLONOSCOPY;  Surgeon: Cleotis Nipper, MD;  Location[facility] ENDOSCOPY;  Service: Endoscopy;  Laterality: N/A;  unprepped, poss flex only  . FOOT SURGERY Left 2006   MID FOOT RECONSTRUCTION  . GANGLION CYST EXCISION Right 12/2007   Dr.Gramig hand  . JOINT REPLACEMENT     bilateral  . LUMBAR LAMINECTOMY     x3 one fusion  . OSTEOTOMY Left 2006   AND FUSION  . PARS PLANA VITRECTOMY W/ REPAIR OF MACULAR HOLE Right 2010  . PERMANENT PACEMAKER INSERTION N/A 03/29/2014   STJ Assurity dual chamber paceamker implnated by Dr Rayann Heman  . TOTAL KNEE ARTHROPLASTY Bilateral 05/02/08, 07/25/08   Dr.Alusio    Social History   Socioeconomic History  . Marital status: Married    Spouse name: JAMES  . Number of children: 2  . Years of education: Not on file  . Highest education level: Not on file  Social Needs  . Financial resource strain: Not on file  . Food insecurity - worry: Not on file  . Food insecurity - inability: Not on file  .  Transportation needs - medical: Not on file  . Transportation needs - non-medical: Not on file  Occupational History  . Occupation: RETIRED RN    Employer: [employer]    Comment: RETIRED IN 2007  Tobacco Use  . Smoking status: Never Smoker  . Smokeless tobacco: Never Used  Substance and Sexual Activity  . Alcohol use: No  . Drug use: No  . Sexual activity: Not Currently  Other Topics Concern  . Not on file  Social History Narrative  . Not on file    Family History  Problem Relation Age of Onset  . Coronary artery  disease Father   . Hypertension Father   . Stroke Father   . Diabetes Father   . Kidney failure Mother   . Hypertension Mother   . Other Brother        SEPSIS  . Diabetes Brother   . Diabetes Sister   . Kidney failure Sister     Review of Systems  Constitutional: Positive for appetite change, chills and fatigue. Negative for fever.  HENT: Positive for congestion (little) and ear pain (left ear). Negative for sinus pressure, sinus pain and sore throat.   Respiratory: Positive for cough (white phlegm), chest tightness, shortness of breath (started two nights ago) and wheezing.   Cardiovascular: Positive for palpitations. Negative for leg swelling.  Gastrointestinal: Positive for nausea and vomiting (dry heaves). Negative for abdominal pain and diarrhea.  Genitourinary: Negative for dysuria and hematuria.  Musculoskeletal: Positive for myalgias (chest, back and arms).  Neurological: Positive for dizziness, light-headedness and headaches.       Objective:   Vitals:   06/02/17 0921  BP: (!) 144/80  Pulse: (!) 118  Resp: 18  Temp: 98.9 F (37.2 C)  SpO2: 92%   Filed Weights   06/02/17 0921  Weight: 163 lb (73.9 kg)   Body mass index is 27.98 kg/m.  Wt Readings from Last 3 Encounters:  06/02/17 163 lb (73.9 kg)  05/09/17 168 lb 6.4 oz (76.4 kg)  02/03/17 170 lb (77.1 kg)   79% on RA,   92% on 3 L via    With HR 118   Physical  Exam GENERAL APPEARANCE: Appears stated age, mildly ill appearing, mild respiratory distress-appears short of breath with nasal cannula-requiring 3 L/min to maintain oxygen at 92% EYES: conjunctiva clear, no icterus HEENT: bilateral tympanic membranes and ear canals normal, oropharynx with no erythema, no thyromegaly, trachea midline, no cervical or supraclavicular lymphadenopathy LUNGS: Mild respiratory distress with shortness of breath, currently on oxygen via nasal cannula, good air entry bilaterally, bibasilar crackles, no obvious wheeze CARDIOVASCULAR: Tachycardia with irregular rhythm, without murmurs, no edema SKIN: warm, dry        Assessment & Plan:   She is experiencing cold symptoms and was found to be hypoxic on exam.  She is experiencing shortness of breath and palpitations.  She is requiring 3 L of oxygen via nasal cannula to maintain oxygenation at 92%.  Likely community-acquired pneumonia causing atrial fibrillation.  She does have paroxysmal atrial fibrillation and is currently not on anticoagulation.  There may be an element of heart failure contributing to the shortness of breath or it may just be pneumonia.  Given this conglomeration of symptoms and need for oxygen she will need to go to emergency room for further evaluation and treatment.  No ambulance was called to transfer her to: Emergency room.  Her daughter and husband are here and will follow her.   See Problem List for Assessment and Plan of chronic medical problems.

## 2017-06-02 NOTE — H&P (Signed)
History and Physical    Becky Forbes QJJ:[id number] DOB: December 30, 1940 DOA: 06/02/2017  PCP: Binnie Rail, MD Consultants: none Patient coming from: Home - lives with husband  Chief Complaint: Shortness of breath, cough  HPI: Becky Forbes is a 77 y.o. female with medical history significant of sick sinus syndrome with implanted pacemaker, paroxysmal atrial fibrillation, hypothyroidism, combined diastolic and systolic congestive heart failure, chronic kidney disease, hypothyroidism, diabetes mellitus who presented to the emergency department with complaints of worsening shortness of breath, cough productive of white sputum and chest tightness.  Patient reported that since December she was battling walled with cough and shortness of breath off and on and on Friday he was traveling to [place] to see his sister, had a lot of walking around and on the way back her daughter noticed that patient developed frequent coughs and some shortness of breath.  Since Friday her condition worsened and eventually this night she could not sleep at all and had to come to the ED for evaluation. She denied PND, orthopnea, chest pain, diaphoresis, lower extremity swelling, abdominal pain, she complained of nausea and dry heaves, but no vomiting.  ED Course: In the ED patient was found to be tachycardic with heart rate 118, tachypneic with respirations of 23, blood pressure was stable, oxygen saturation was 92 on RA White blood cells count was 12,100, Hemoglobin 9.4, BUN 20 and creatinine 1.48, lactic acid was elevated to 2.50, BNP  greater than 2300 and troponin 0 0.04 Chest x-ray demonstrated bilateral basilar consolidations concerning for pneumonia and EKG demonstrated atrial flutter without acute ST-T wave changes.  Review of Systems: As per HPI; otherwise review of systems reviewed and negative.   Ambulatory Status:  Ambulates without assistance  Past Medical History:  Diagnosis Date  . Anemia    hx  .  Arthritis   . Carotid artery disease ([place])    a. Carotid duplex in 2010: 48-18% RICA, 56-31% LICA.  Marland Kitchen Chronic combined systolic and diastolic CHF (congestive heart failure) ([place])   . CKD (chronic kidney disease), stage III ([place])    no nephrologist   . Complication of anesthesia   . Diabetes mellitus    x 10 yrs  . GI bleed    a. 04/2012 - lower GIB - felt 2/2 mild ischemic colitis as shown on colonoscopy, was cleared for anticoag 1 week out from procedure.  Marland Kitchen Heart murmur    hx  . HTN (hypertension)   . Hyperlipemia   . Hypothyroidism   . Ischemic colitis ([place])    a. 04/2012 - lower GIB - felt 2/2 mild ischemic colitis as shown on colonoscopy..  . PAF (paroxysmal atrial fibrillation) ([place])   . PONV (postoperative nausea and vomiting)   . 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 Renal insufficiency   . Sick sinus syndrome ([place])   . Symptomatic sinus bradycardia    a. Crystal DR  dual-chamber pacemaker 03/2014 by Dr. Rayann Heman.  . Typical atrial flutter [facility])     Past Surgical History:  Procedure Laterality Date  . ABDOMINAL HYSTERECTOMY    . APPENDECTOMY    . BACK SURGERY    . CARPAL TUNNEL RELEASE Right   . CATARACT EXTRACTION  05/25/2007   RIGHT EYE,left  . COLONOSCOPY  01/31/2009   small external/internal hemorrhoids, diverticulosis in sigmoid colon, one small polyp (biopsied). Biospy essentially normal. Dr.Magod  . COLONOSCOPY  04/30/2012   Procedure: COLONOSCOPY;  Surgeon: Cleotis Nipper, MD;  Location: [facility] ENDOSCOPY;  Service: Endoscopy;  Laterality: N/A;  unprepped, poss flex only  . FOOT SURGERY Left 2006   MID FOOT RECONSTRUCTION  . GANGLION CYST EXCISION Right 12/2007   Dr.Gramig hand  . JOINT REPLACEMENT     bilateral  . LUMBAR LAMINECTOMY     x3 one fusion  . OSTEOTOMY Left 2006   AND FUSION  . PARS PLANA VITRECTOMY W/ REPAIR OF MACULAR HOLE Right 2010  . PERMANENT PACEMAKER INSERTION N/A 03/29/2014   STJ Assurity dual chamber  paceamker implnated by Dr Rayann Heman  . TOTAL KNEE ARTHROPLASTY Bilateral 05/02/08, 07/25/08   Dr.Alusio    Social History   Socioeconomic History  . Marital status: Married    Spouse name: JAMES  . Number of children: 2  . Years of education: Not on file  . Highest education level: Not on file  Social Needs  . Financial resource strain: Not on file  . Food insecurity - worry: Not on file  . Food insecurity - inability: Not on file  . Transportation needs - medical: Not on file  . Transportation needs - non-medical: Not on file  Occupational History  . Occupation: RETIRED RN    Employer: [employer]    Comment: RETIRED IN 2007  Tobacco Use  . Smoking status: Never Smoker  . Smokeless tobacco: Never Used  Substance and Sexual Activity  . Alcohol use: No  . Drug use: No  . Sexual activity: Not Currently  Other Topics Concern  . Not on file  Social History Narrative  . Not on file    Allergies  Allergen Reactions  . Morphine And Related Itching, Nausea And Vomiting, Rash and Other (See Comments)    01/04/15- patient says she is NOT allergic to morphine.  Morphine IV given this AM 01/04/15 in the ED. No adverse reaction.  Celesta Gentile [Sitagliptin Phosphate] Other (See Comments)    Pt says this caused elevated liver enzymes and creatinine. IS TAKING 1/2 TABLET  . Actos [Pioglitazone Hydrochloride] Swelling    UNSPECIFIED REACTION   . Hydrocodone-Acetaminophen Itching, Nausea And Vomiting and Other (See Comments)    Per pt 05/12/15 she had this recently in the hospital with no issues at all  . Keflex [Cephalexin] Rash and Other (See Comments)    Burning sensation all over  . Percocet [Oxycodone-Acetaminophen] Nausea And Vomiting    Family History  Problem Relation Age of Onset  . Coronary artery disease Father   . Hypertension Father   . Stroke Father   . Diabetes Father   . Kidney failure Mother   . Hypertension Mother   . Other Brother        SEPSIS  .  Diabetes Brother   . Diabetes Sister   . Kidney failure Sister     Prior to Admission medications   Medication Sig Start Date End Date Taking? Authorizing Provider  acetaminophen (TYLENOL) 325 MG tablet Take 650 mg by mouth every 6 (six) hours as needed for moderate pain.   Yes [provider]  amiodarone (PACERONE) 200 MG tablet Take 200 mg by mouth 3 (three) times a week.   Yes [provider]  atorvastatin (LIPITOR) 20 MG tablet TAKE 1 TABLET BY MOUTH  DAILY Patient taking differently: TAKE 20 mg TABLET BY MOUTH  DAILY 12/05/16  Yes Nahser, Wonda Cheng, MD  bromocriptine (PARLODEL) 2.5 MG tablet TAKE 1 TABLET BY MOUTH  DAILY Patient taking differently: TAKE 2.5 mg  TABLET BY MOUTH  DAILY 04/25/16  Yes Renato Shin, MD  cholecalciferol (VITAMIN D) 400 units TABS tablet Take 800 Units by mouth daily.   Yes [provider]  furosemide (LASIX) 20 MG tablet TAKE 1 TABLET BY MOUTH  DAILY AS NEEDED Patient taking differently: TAKE 20 mg TABLET BY MOUTH  DAILY AS NEEDED 12/05/16  Yes Nahser, Wonda Cheng, MD  JANUVIA 100 MG tablet TAKE ONE-HALF TABLET BY  MOUTH DAILY Patient taking differently: TAKE 50 mg  TABLET BY  MOUTH DAILY 02/21/17  Yes Renato Shin, MD  levothyroxine (SYNTHROID, LEVOTHROID) 88 MCG tablet Take 1 tablet (88 mcg total) daily by mouth. 02/25/17  Yes Burns, Claudina Lick, MD  metoprolol tartrate (LOPRESSOR) 25 MG tablet Take 1 tablet (25 mg total) by mouth 2 (two) times daily. 02/03/17 06/02/17 Yes Nahser, Wonda Cheng, MD  ramipril (ALTACE) 5 MG capsule TAKE 1 CAPSULE BY MOUTH TWO TIMES DAILY Patient taking differently: TAKE 5 mg  CAPSULE BY MOUTH TWO TIMES DAILY 08/08/16  Yes Burns, Claudina Lick, MD  repaglinide (PRANDIN) 2 MG tablet 1 1/2 tabs with breakfast, 1 1/2 with lunch, and 1/2 with supper Patient taking differently: Take 1-3 mg by mouth See admin instructions. 3 mg  tabs with breakfast, 3 mg  with lunch, and 1 mg with supper 05/09/17  Yes Renato Shin, MD     Physical Exam: Vitals:   06/02/17 1049 06/02/17 1100 06/02/17 1200 06/02/17 1230  BP:  125/66 129/69 121/66  Pulse:  95 72 88  Resp:  (!) 21 18 (!) 23  SpO2: 99% 99% 95% 98%  Weight: 73.5 kg (162 lb)     Height: 5\' 4"  (1.626 m)        General:  Appears calm and comfortable on supplemental oxygen Eyes: PERRLA, sckerae unicteric, EOM intact,  ENT: normal external ear canals, oral mucous membranes moist and intact, no nasal discharge Neck: no lympnadenopathy, masses or thyromegaly Cardiovascular: Irregular rhythm, no murmurs, gallops or rubs, no LE edema. Respiratory:  Bilateral basilar crackles, no rhonchi or wheezes. Normal respiratory effort. Abdomen: soft, NT / ND, BS (+) 4, no masses or organomegaly appreciated Skin: no rash or induration seen on limited exam Musculoskeletal: no joint deformities observed, active full ROM  Psychiatric: grossly normal mood and affect, speech fluent and appropriate Neurologic: CN II-XII grossly normal, no focal deficit visualized, moves all extremities independently, DTR and sensation intact.        Radiological Exams on Admission: Dg Chest Port 1 View  Result Date: 06/02/2017 CLINICAL DATA:  Shortness of breath. Chest tightness. Productive cough. EXAM: PORTABLE CHEST 1 VIEW COMPARISON:  03/30/2014 FINDINGS: There are new hazy infiltrates with interstitial accentuation in the right upper lobe and in the left mid and lower lung zones. Overall heart size and pulmonary vascularity are normal. Aortic atherosclerosis. No definitive pleural effusions.  No acute bone abnormality. IMPRESSION: New bilateral pulmonary infiltrates. Pneumonia should be considered, given the lack of pulmonary vascular congestion the. Aortic Atherosclerosis (ICD10-I70.0). Electronically Signed   By: Lorriane Shire M.D.   On: 06/02/2017 11:25    EKG: Independently reviewed.  Atrial flutter without acute ST-T wave changes  Labs on Admission: I have personally reviewed the  available labs and imaging studies at the time of the admission.  Pertinent labs:  White blood cell count 12,100 BNP greater than 2300 Creatinine 1.48 with normal creatinine at baseline Lactic acid 2.50    Assessment/Plan Principal Problem:   Acute respiratory failure (HCC) Active Problems:   HYPERLIPIDEMIA  PAF (paroxysmal atrial fibrillation) (HCC)   Chronic combined systolic and diastolic CHF (congestive heart failure) (HCC)   Paroxysmal A-fib (HCC)   Benign essential HTN   Hypothyroidism due to amiodarone   CKD (chronic kidney disease)   CAP (community acquired pneumonia)   Acute exacerbation of CHF (congestive heart failure) ([place])   Sepsis - most likely associate with CAP Continue IV antibiotic, follow WBC count and lactic acid  Acute respiratory failure - multifactorial and most likely associated with addition of pneumonia superimposed on CHF exacerbation Continue Levaquin IV, incentive spirometry, expectorant, supplemental oxygen, nebs prn Patient has a history of combined systolic and diastolic congestive heart failure and is taking Lasix at home on as needed basis Will give Lasix IV 40 mg x2 doses and reassess tomorrow if patient needs to continue IV Lasix could be switched to oral  Hypertension Continue Altace and Lopressor, follow blood pressure readings adjust the doses as needed  Hypothyroidism - most recent TSH  1.750 on 02/03/17 Continue Synthroid, recheck  TSH given acute CHF and if needed, adjust the dose  Acute exacerbation of CKD stage III -creatinine is 1.48 and in October it was 1.20 Unfortunately, renal function might further decline given diuretic therapy Follow up renal function by BMP Will hold Altace   DVT prophylaxis: Lovenox  Code Status: Full - confirmed with patient/family Family Communication: Husband and daughter at bedside  Disposition Plan: Home once clinically improved Consults called: None Admission status: Patient   York Grice PA-C [phone] [facility]  If note is complete, please contact covering daytime or nighttime physician. [email or web address] Password [facility]  06/02/2017, 1:36 PM

## 2017-06-02 NOTE — ED Triage Notes (Signed)
Per [facility] patient was a pcp office. Complaining SOB x 2 days and center chest tightness that began yesterday. No radiation. Patient 79 % on room air. 99% on 6 L. Hx afib. No thinners due to previous gi bleed. Demand pacemaker. Lungs clear with ems. EKG shows afib rate 108. 324 mg aspirin administered by ems and 1 nitro bringing pain from 5 to a 3. Patient alert and oriented x4.

## 2017-06-02 NOTE — Plan of Care (Signed)
  Education: Knowledge of General Education information will improve 06/02/2017 2309 - Progressing by Theador Hawthorne, RN   Clinical Measurements: Respiratory complications will improve 06/02/2017 2309 - Progressing by Theador Hawthorne, RN   Activity: Risk for activity intolerance will decrease 06/02/2017 2309 - Progressing by Theador Hawthorne, RN   Coping: Level of anxiety will decrease 06/02/2017 2309 - Progressing by Theador Hawthorne, RN   Pain Managment: General experience of comfort will improve 06/02/2017 2309 - Progressing by Theador Hawthorne, RN   Safety: Ability to remain free from injury will improve 06/02/2017 2309 - Progressing by Theador Hawthorne, RN

## 2017-06-02 NOTE — ED Provider Notes (Signed)
[name] Provider Note   CSN: [id number] Arrival date & time: 06/02/17  1035     History   Chief Complaint Chief Complaint  Patient presents with  . Shortness of Breath  . Atrial Fibrillation  . Chest Pain    HPI Becky Forbes is a 77 y.o. female.  The history is provided by the patient.  Shortness of Breath  This is a new problem. The current episode started more than 2 days ago. The problem has been gradually worsening. Associated symptoms include cough. Pertinent negatives include no fever, no sore throat, no ear pain, no chest pain, no vomiting, no abdominal pain and no rash. It is unknown what precipitated the problem. She has tried nothing for the symptoms. The treatment provided no relief. Associated medical issues include CAD and heart failure.    77 year old female complaining of shortness of breath and chest tightness.  She states she has been sick since December with a cold that recurred again on Friday.  She has had this chest tightness shortness of breath cough productive of white phlegm.  The chest tightness she rates as 2 out of 10.  She also had some dry heaves and nausea no abdominal pain vomiting diarrhea or urinary symptoms.  EMS gave her aspirin and a nitro.  Past Medical History:  Diagnosis Date  . Anemia    hx  . Arthritis   . Carotid artery disease ([place])    a. Carotid duplex in 2010: 64-40% RICA, 34-74% LICA.  Marland Kitchen Chronic combined systolic and diastolic CHF (congestive heart failure) ([place])   . CKD (chronic kidney disease), stage III ([place])    no nephrologist   . Complication of anesthesia   . Diabetes mellitus    x 10 yrs  . GI bleed    a. 04/2012 - lower GIB - felt 2/2 mild ischemic colitis as shown on colonoscopy, was cleared for anticoag 1 week out from procedure.  Marland Kitchen Heart murmur    hx  . HTN (hypertension)   . Hyperlipemia   . Hypothyroidism   . Ischemic colitis ([place])    a. 04/2012 - lower GIB - felt 2/2  mild ischemic colitis as shown on colonoscopy..  . PAF (paroxysmal atrial fibrillation) ([place])   . PONV (postoperative nausea and vomiting)   . 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 Renal insufficiency   . Sick sinus syndrome ([place])   . Symptomatic sinus bradycardia    a. [place] DR  dual-chamber pacemaker 03/2014 by Dr. Rayann Heman.  . Typical atrial flutter [facility])     Patient Active Problem List   Diagnosis Date Noted  . Fatigue 02/03/2017  . CKD (chronic kidney disease) 07/02/2016  . Spondylolisthesis of lumbar region 01/05/2016  . Diabetes ([place]) 06/14/2015  . Fracture of rib of left side 04/07/2015  . 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 BRBPR (bright red blood per rectum) 01/04/2015  . Paroxysmal A-fib ([place]) 01/04/2015  . Colitis, acute 01/04/2015  . Benign essential HTN 01/04/2015  . History of colonic polyps 09/26/2014  . PAF (paroxysmal atrial fibrillation) ([place])   . Chronic combined systolic and diastolic CHF (congestive heart failure) ([place])   . Carotid artery disease ([place])   . Symptomatic sinus bradycardia 03/31/2014  . S/P cardiac pacemaker procedure, [employer]. [employer] device 03/31/2014  . Sick sinus syndrome ([place])   . Typical atrial flutter ([place])   . Ischemic colitis ([place]) 05/06/2012  . Diverticulosis 04/28/2012  .  HYPERLIPIDEMIA 06/23/2007  . 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 Osteopenia 02/11/2007    Past Surgical History:  Procedure Laterality Date  . ABDOMINAL HYSTERECTOMY    . APPENDECTOMY    . BACK SURGERY    . CARPAL TUNNEL RELEASE Right   . CATARACT EXTRACTION  05/25/2007   RIGHT EYE,left  . COLONOSCOPY  01/31/2009   small external/internal hemorrhoids, diverticulosis in sigmoid colon, one small polyp (biopsied). Biospy essentially normal. Dr.Magod  . COLONOSCOPY  04/30/2012   Procedure: COLONOSCOPY;  Surgeon: Cleotis Nipper, MD;  Location: [facility] ENDOSCOPY;  Service: Endoscopy;  Laterality: N/A;  unprepped,  poss flex only  . FOOT SURGERY Left 2006   MID FOOT RECONSTRUCTION  . GANGLION CYST EXCISION Right 12/2007   Dr.Gramig hand  . JOINT REPLACEMENT     bilateral  . LUMBAR LAMINECTOMY     x3 one fusion  . OSTEOTOMY Left 2006   AND FUSION  . PARS PLANA VITRECTOMY W/ REPAIR OF MACULAR HOLE Right 2010  . PERMANENT PACEMAKER INSERTION N/A 03/29/2014   [employer] dual chamber paceamker implnated by Dr Rayann Heman  . TOTAL KNEE ARTHROPLASTY Bilateral 05/02/08, 07/25/08   Dr.Alusio    OB History    No data available       Home Medications    Prior to Admission medications   Medication Sig Start Date End Date Taking? Authorizing Provider  acetaminophen (TYLENOL) 325 MG tablet Take 650 mg by mouth every 6 (six) hours as needed for moderate pain.    [provider]  amiodarone (PACERONE) 200 MG tablet Take 200 mg by mouth 3 (three) times a week.    [provider]  atorvastatin (LIPITOR) 20 MG tablet TAKE 1 TABLET BY MOUTH  DAILY 12/05/16   Nahser, Wonda Cheng, MD  bromocriptine (PARLODEL) 2.5 MG tablet TAKE 1 TABLET BY MOUTH  DAILY 04/25/16   Renato Shin, MD  cholecalciferol (VITAMIN D) 400 units TABS tablet Take 800 Units by mouth daily.    [provider]  furosemide (LASIX) 20 MG tablet TAKE 1 TABLET BY MOUTH  DAILY AS NEEDED 12/05/16   Nahser, Wonda Cheng, MD  glucose blood (BAYER CONTOUR NEXT TEST) test strip 1 each by Other route daily. And lancets 1/day 11/19/16   Renato Shin, MD  JANUVIA 100 MG tablet TAKE ONE-HALF TABLET BY  MOUTH DAILY 02/21/17   Renato Shin, MD  levothyroxine (SYNTHROID, LEVOTHROID) 88 MCG tablet Take 1 tablet (88 mcg total) daily by mouth. 02/25/17   Burns, Claudina Lick, MD  metoprolol tartrate (LOPRESSOR) 25 MG tablet Take 1 tablet (25 mg total) by mouth 2 (two) times daily. 02/03/17 05/04/17  Nahser, Wonda Cheng, MD  Multiple Vitamin (MULITIVITAMIN WITH MINERALS) TABS Take 1 tablet by mouth daily.    [provider]  ramipril (ALTACE) 5 MG  capsule TAKE 1 CAPSULE BY MOUTH TWO TIMES DAILY 08/08/16   Binnie Rail, MD  repaglinide (PRANDIN) 2 MG tablet 1 1/2 tabs with breakfast, 1 1/2 with lunch, and 1/2 with supper 05/09/17   Renato Shin, MD    Family History Family History  Problem Relation Age of Onset  . Coronary artery disease Father   . Hypertension Father   . Stroke Father   . Diabetes Father   . Kidney failure Mother   . Hypertension Mother   . Other Brother        SEPSIS  . Diabetes Brother   . Diabetes Sister   . Kidney failure  Sister     Social History Social History   Tobacco Use  . Smoking status: Never Smoker  . Smokeless tobacco: Never Used  Substance Use Topics  . Alcohol use: No  . Drug use: No     Allergies   Morphine and related; Januvia [sitagliptin phosphate]; Actos [pioglitazone hydrochloride]; Hydrocodone-acetaminophen; Keflex [cephalexin]; and Percocet [oxycodone-acetaminophen]   Review of Systems Review of Systems  Constitutional: Positive for fatigue. Negative for chills and fever.  HENT: Negative for ear pain and sore throat.   Eyes: Negative for pain and visual disturbance.  Respiratory: Positive for cough, chest tightness and shortness of breath.   Cardiovascular: Negative for chest pain and palpitations.  Gastrointestinal: Positive for nausea. Negative for abdominal pain and vomiting.  Genitourinary: Negative for dysuria and hematuria.  Musculoskeletal: Negative for arthralgias and back pain.  Skin: Negative for color change and rash.  Neurological: Negative for seizures and syncope.  All other systems reviewed and are negative.    Physical Exam Updated Vital Signs Ht 5\' 4"  (1.626 m)   Wt 73.5 kg (162 lb)   SpO2 99%   BMI 27.81 kg/m   Physical Exam  Constitutional: She appears well-developed and well-nourished. No distress.  HENT:  Head: Normocephalic and atraumatic.  Eyes: Conjunctivae are normal.  Neck: Neck supple.  Cardiovascular: An irregularly irregular  rhythm present. Tachycardia present.  No murmur heard. Pulmonary/Chest: Effort normal. No respiratory distress. She has rhonchi in the right middle field, the right lower field, the left middle field and the left lower field.  Abdominal: Soft. There is no tenderness.  Musculoskeletal: She exhibits no edema.       Right lower leg: Normal. She exhibits no tenderness and no edema.       Left lower leg: Normal. She exhibits no tenderness and no edema.  Neurological: She is alert.  Skin: Skin is warm and dry.  Psychiatric: She has a normal mood and affect.  Nursing note and vitals reviewed.    ED Treatments / Results  Labs (all labs ordered are listed, but only abnormal results are displayed) Labs Reviewed  BASIC METABOLIC PANEL - Abnormal; Notable for the following components:      Result Value   Glucose, Bld 213 (*)    Creatinine, Ser 1.48 (*)    Calcium 8.5 (*)    GFR calc non Af Amer 33 (*)    GFR calc Af Amer 38 (*)    All other components within normal limits  CBC - Abnormal; Notable for the following components:   WBC 12.1 (*)    RBC 3.19 (*)    Hemoglobin 9.4 (*)    HCT 29.8 (*)    All other components within normal limits  BRAIN NATRIURETIC PEPTIDE - Abnormal; Notable for the following components:   B Natriuretic Peptide 2,340.8 (*)    All other components within normal limits  CBC - Abnormal; Notable for the following components:   WBC 10.9 (*)    RBC 3.02 (*)    Hemoglobin 9.0 (*)    HCT 28.1 (*)    All other components within normal limits  CREATININE, SERUM - Abnormal; Notable for the following components:   Creatinine, Ser 1.36 (*)    GFR calc non Af Amer 36 (*)    GFR calc Af Amer 42 (*)    All other components within normal limits  HEMOGLOBIN A1C - Abnormal; Notable for the following components:   Hgb A1c MFr Bld 7.6 (*)  All other components within normal limits  BASIC METABOLIC PANEL - Abnormal; Notable for the following components:   Sodium 134 (*)      Chloride 100 (*)    CO2 20 (*)    Glucose, Bld 182 (*)    BUN 22 (*)    Creatinine, Ser 1.36 (*)    Calcium 8.4 (*)    GFR calc non Af Amer 36 (*)    GFR calc Af Amer 42 (*)    All other components within normal limits  CBC WITH DIFFERENTIAL/PLATELET - Abnormal; Notable for the following components:   WBC 11.8 (*)    RBC 3.38 (*)    Hemoglobin 10.1 (*)    HCT 31.2 (*)    Neutro Abs 10.1 (*)    All other components within normal limits  GLUCOSE, CAPILLARY - Abnormal; Notable for the following components:   Glucose-Capillary 220 (*)    All other components within normal limits  GLUCOSE, CAPILLARY - Abnormal; Notable for the following components:   Glucose-Capillary 214 (*)    All other components within normal limits  GLUCOSE, CAPILLARY - Abnormal; Notable for the following components:   Glucose-Capillary 160 (*)    All other components within normal limits  I-STAT CG4 LACTIC ACID, ED - Abnormal; Notable for the following components:   Lactic Acid, Venous 2.50 (*)    All other components within normal limits  CULTURE, BLOOD (ROUTINE X 2)  CULTURE, EXPECTORATED SPUTUM-ASSESSMENT  GRAM STAIN  RESPIRATORY PANEL BY PCR  INFLUENZA PANEL BY PCR (TYPE A & B)  HIV ANTIBODY (ROUTINE TESTING)  TSH  LACTIC ACID, PLASMA  STREP PNEUMONIAE URINARY ANTIGEN  I-STAT TROPONIN, ED  I-STAT CG4 LACTIC ACID, ED    EKG  EKG Interpretation  Date/Time:  Monday June 02 2017 10:45:22 EST Ventricular Rate:  87 PR Interval:    QRS Duration: 96 QT Interval:  326 QTC Calculation: 393 R Axis:   169 Text Interpretation:  Atrial flutter with predominant 3:1 AV block Right axis deviation Consider left ventricular hypertrophy t wave inversions [id number]  compared to prior new a flutter and unchanged t wave inversions Confirmed by Aletta Edouard [phone]) on 06/02/2017 10:51:00 AM       Radiology Dg Chest Port 1 View  Result Date: 06/02/2017 CLINICAL DATA:  Shortness of breath. Chest  tightness. Productive cough. EXAM: PORTABLE CHEST 1 VIEW COMPARISON:  03/30/2014 FINDINGS: There are new hazy infiltrates with interstitial accentuation in the right upper lobe and in the left mid and lower lung zones. Overall heart size and pulmonary vascularity are normal. Aortic atherosclerosis. No definitive pleural effusions.  No acute bone abnormality. IMPRESSION: New bilateral pulmonary infiltrates. Pneumonia should be considered, given the lack of pulmonary vascular congestion the. Aortic Atherosclerosis (ICD10-I70.0). Electronically Signed   By: Lorriane Shire M.D.   On: 06/02/2017 11:25    Procedures Procedures (including critical care time)  Medications Ordered in ED Medications - No data to display   Initial Impression / Assessment and Plan / ED Course  I have reviewed the triage vital signs and the nursing notes.  Pertinent labs & imaging results that were available during my care of the patient were reviewed by me and considered in my medical decision making (see chart for details).  Clinical Course as of Jun 03 1036  Mon Jun 02, 2017  1206 Patient with elevated lactate and elevated white count with chest x-ray concerning for pneumonia.  She is allergic to Keflex and is not received any  third or fourth generation cephalosporin.  I reviewed this with pharmacy and they are recommending proceeding with Levaquin which is ordered.  [MB]    Clinical Course User Index [MB] Hayden Rasmussen, MD      Final Clinical Impressions(s) / ED Diagnoses   Final diagnoses:  Shortness of breath  Acute on chronic congestive heart failure, unspecified heart failure type ([place])  Pneumonia of lower lobe due to infectious organism, unspecified laterality [facility])    ED Discharge Orders    None       Hayden Rasmussen, MD 06/03/17 1041

## 2017-06-02 NOTE — Patient Instructions (Signed)
Will go to the emergency room for further evaluation and treatment.  We will follow-up after discharge.

## 2017-06-03 ENCOUNTER — Other Ambulatory Visit (HOSPITAL_COMMUNITY): Payer: Medicare Other

## 2017-06-03 ENCOUNTER — Inpatient Hospital Stay (HOSPITAL_COMMUNITY): Payer: Medicare Other

## 2017-06-03 DIAGNOSIS — I361 Nonrheumatic tricuspid (valve) insufficiency: Secondary | ICD-10-CM

## 2017-06-03 DIAGNOSIS — T462X1A Poisoning by other antidysrhythmic drugs, accidental (unintentional), initial encounter: Secondary | ICD-10-CM

## 2017-06-03 DIAGNOSIS — E782 Mixed hyperlipidemia: Secondary | ICD-10-CM

## 2017-06-03 DIAGNOSIS — E032 Hypothyroidism due to medicaments and other exogenous substances: Secondary | ICD-10-CM

## 2017-06-03 DIAGNOSIS — J181 Lobar pneumonia, unspecified organism: Secondary | ICD-10-CM

## 2017-06-03 DIAGNOSIS — I10 Essential (primary) hypertension: Secondary | ICD-10-CM

## 2017-06-03 DIAGNOSIS — R0602 Shortness of breath: Secondary | ICD-10-CM

## 2017-06-03 LAB — CBC WITH DIFFERENTIAL/PLATELET
BASOS ABS: 0 10*3/uL (ref 0.0–0.1)
BASOS PCT: 0 %
EOS ABS: 0.1 10*3/uL (ref 0.0–0.7)
Eosinophils Relative: 0 %
HCT: 31.2 % — ABNORMAL LOW (ref 36.0–46.0)
Hemoglobin: 10.1 g/dL — ABNORMAL LOW (ref 12.0–15.0)
Lymphocytes Relative: 9 %
Lymphs Abs: 1 10*3/uL (ref 0.7–4.0)
MCH: 29.9 pg (ref 26.0–34.0)
MCHC: 32.4 g/dL (ref 30.0–36.0)
MCV: 92.3 fL (ref 78.0–100.0)
MONO ABS: 0.6 10*3/uL (ref 0.1–1.0)
MONOS PCT: 5 %
NEUTROS PCT: 86 %
Neutro Abs: 10.1 10*3/uL — ABNORMAL HIGH (ref 1.7–7.7)
Platelets: 246 10*3/uL (ref 150–400)
RBC: 3.38 MIL/uL — ABNORMAL LOW (ref 3.87–5.11)
RDW: 14.8 % (ref 11.5–15.5)
WBC: 11.8 10*3/uL — ABNORMAL HIGH (ref 4.0–10.5)

## 2017-06-03 LAB — GLUCOSE, CAPILLARY
GLUCOSE-CAPILLARY: 214 mg/dL — AB (ref 65–99)
GLUCOSE-CAPILLARY: 195 mg/dL — AB (ref 65–99)
Glucose-Capillary: 160 mg/dL — ABNORMAL HIGH (ref 65–99)
Glucose-Capillary: 207 mg/dL — ABNORMAL HIGH (ref 65–99)
Glucose-Capillary: 141 mg/dL — ABNORMAL HIGH (ref 65–99)

## 2017-06-03 LAB — RESPIRATORY PANEL BY PCR
Adenovirus: NOT DETECTED
BORDETELLA PERTUSSIS-RVPCR: NOT DETECTED
CHLAMYDOPHILA PNEUMONIAE-RVPPCR: NOT DETECTED
Coronavirus 229E: NOT DETECTED
Coronavirus HKU1: NOT DETECTED
Coronavirus NL63: NOT DETECTED
Coronavirus OC43: NOT DETECTED
INFLUENZA A-RVPPCR: NOT DETECTED
INFLUENZA B-RVPPCR: NOT DETECTED
MYCOPLASMA PNEUMONIAE-RVPPCR: NOT DETECTED
Metapneumovirus: NOT DETECTED
PARAINFLUENZA VIRUS 3-RVPPCR: NOT DETECTED
PARAINFLUENZA VIRUS 4-RVPPCR: NOT DETECTED
Parainfluenza Virus 1: NOT DETECTED
Parainfluenza Virus 2: NOT DETECTED
RHINOVIRUS / ENTEROVIRUS - RVPPCR: NOT DETECTED
Respiratory Syncytial Virus: NOT DETECTED

## 2017-06-03 LAB — BASIC METABOLIC PANEL
ANION GAP: 14 (ref 5–15)
BUN: 22 mg/dL — ABNORMAL HIGH (ref 6–20)
CALCIUM: 8.4 mg/dL — AB (ref 8.9–10.3)
CO2: 20 mmol/L — ABNORMAL LOW (ref 22–32)
CREATININE: 1.36 mg/dL — AB (ref 0.44–1.00)
Chloride: 100 mmol/L — ABNORMAL LOW (ref 101–111)
GFR, EST AFRICAN AMERICAN: 42 mL/min — AB (ref >60)
GFR, EST NON AFRICAN AMERICAN: 36 mL/min — AB (ref >60)
Glucose, Bld: 182 mg/dL — ABNORMAL HIGH (ref 65–99)
Potassium: 3.7 mmol/L (ref 3.5–5.1)
Sodium: 134 mmol/L — ABNORMAL LOW (ref 135–145)

## 2017-06-03 LAB — HIV ANTIBODY (ROUTINE TESTING W REFLEX): HIV SCREEN 4TH GENERATION: NONREACTIVE

## 2017-06-03 LAB — ECHOCARDIOGRAM COMPLETE
HEIGHTINCHES: 64 in
WEIGHTICAEL: 2585.6 [oz_av]

## 2017-06-03 LAB — STREP PNEUMONIAE URINARY ANTIGEN: STREP PNEUMO URINARY ANTIGEN: NEGATIVE

## 2017-06-03 MED ORDER — IPRATROPIUM BROMIDE 0.02 % IN SOLN
0.5000 mg | Freq: Four times a day (QID) | RESPIRATORY_TRACT | Status: DC
  Administered 2017-06-03 – 2017-06-04 (×4): 0.5 mg via RESPIRATORY_TRACT
  Filled 2017-06-03 (×4): qty 2.5

## 2017-06-03 MED ORDER — APIXABAN 5 MG PO TABS
5.0000 mg | ORAL_TABLET | Freq: Two times a day (BID) | ORAL | Status: DC
  Administered 2017-06-03 – 2017-06-04 (×2): 5 mg via ORAL
  Filled 2017-06-03 (×2): qty 1

## 2017-06-03 MED ORDER — LEVALBUTEROL HCL 0.63 MG/3ML IN NEBU
0.6300 mg | INHALATION_SOLUTION | Freq: Four times a day (QID) | RESPIRATORY_TRACT | Status: DC
  Administered 2017-06-03 – 2017-06-04 (×4): 0.63 mg via RESPIRATORY_TRACT
  Filled 2017-06-03 (×4): qty 3

## 2017-06-03 NOTE — Progress Notes (Signed)
Patient stable overnight. No complaints of pain. Patient remains SOB with ambulation. Maintaining sats on 6L. Unable to wean down as sats drop with exertion. No complaints at this time. Will continue to monitor patient.

## 2017-06-03 NOTE — Progress Notes (Signed)
  Echocardiogram 2D Echocardiogram has been performed.  Jennette Dubin 06/03/2017, 1:31 PM

## 2017-06-03 NOTE — Progress Notes (Signed)
PROGRESS NOTE    Becky Forbes  GHW:[id number] DOB: [birth year] DOA: 06/02/2017 PCP: Binnie Rail, MD   Brief Narrative:  Becky Forbes is a 77 y.o. female with a Past Medical History of afib on [facility]; pacemaker placement; hypothyroidism; HLD; HTN; DM; stage 3 CKD; chronic combined heart failure; and carotid stenosis who presents with recurrent respiratory symptoms since December.  She has not been seen and has not taken abx for this.  She reports recurrent symptoms over the last few days with SOB, cough productive of whitish sputum.  No weight changes, no LE edema.  No fever.  No sick contacts. Admitted with Acute Respiratory Failure with Hypoxia 2/2 Multifactorial Causes.   Assessment & Plan:   Principal Problem:   Acute respiratory failure (HCC) Active Problems:   HYPERLIPIDEMIA   PAF (paroxysmal atrial fibrillation) (HCC)   Chronic combined systolic and diastolic CHF (congestive heart failure) (HCC)   Paroxysmal A-fib (HCC)   Benign essential HTN   Hypothyroidism due to amiodarone   CKD (chronic kidney disease)   CAP (community acquired pneumonia)   Acute exacerbation of CHF (congestive heart failure) ([place])   Sepsis ([place])  Acute respiratory failure with hypoxia likely Multifactorial including Systolic CHF Exacerbation, Atrial Fibrillation/Flutter, and suspected CAP -CHF was initially the primary diagnosis based on elevated BNP; however, prior pro-BNP (no prior BNP in Epic) was markedly elevated; symptoms are less typical; and prior echo in 2015 was only minimally abnormal.   -Repeat ECHO ordered and pending -Given IV Lasix x2 -Continue to [place] Volume Status -More likely diagnosis is a bibasilar PNA.   -Patient with recurrent URI illnesses (typical in description) without antibiotics now presenting with cough and respiratory compromise.  -Will treat as CAP with Levaquin (has a Keflex allergy).   -Supplemental O2 prn.  Possible sepsis on presentation, but low suspicion based on  generally well appearance.  Lactate has down-trended.  Blood cultures pending. -Added Mucinex, Xopenex/Atrovent scheduled and DuoNeb PRN -Flutter Valve and Incentive Spirometry -Repeat CXR in AM   ?Sepsis - most likely associated with CAP Continue IV antibiotic, follow WBC count and lactic acid  Acute on Chronic Systolic CHF -Strict's I's/O's, Daily Weights -Given IV Lasix x2; Hold further lasix for now -Patient is +80 mL; Weight is down 1 lb -C/w Home Metoprolol 25 mg po BID  Essential Hypertension -Holding Home Ramipril -C/w Home Metoprolol 25 mg po BID -Blood Pressure on the Softer Side  Diabetse Mellitus Type 2 -Hold Home Januvia and Repaglinide  -C/w SSI Insulin Moderate Scale -Check HbA1c  Lactic Acidosis -Improved. LA went from 2.50 -> 1.40  CKD stage III  -Upon Prior Review Baseline appears to be around 1.3-1.6 -BUN/Cr now 22/1.36 -Avoid Nephrotoxic Medications if possible. Patient's Ramipril 5 mg po BID held -Continue to Monitor and Repeat CMP in AM    Paroxysmal Atrial Fibrillation/Atrial Flutter -Had Been in NSR at previous Cardiology visit but now back in Atrial Fibrillation -Elevated CHADS2VASc (at least 6) but has Hx of GIB x2 -Checked TSH and was 0.405 -Case was discussed with Primary Cardiologist; After discussion will place on Apixaban 5 mg po BID -C/w Amiodarone 200 mg 3x a Week; Based on Discussion with Dr. Acie Fredrickson may need to go Daily -C/w Metoprolol 25 mg po BID -Check ECHOCardiogram -May benefit from Formal Cardiology consultation if still dyspneic -C/w Telemetry   Hypothyroidism -Checked TSH and was 0.405; Last TSH on 02/03/17 was 1.750 -C/w Levothyroxine 88 mcg po Daily   HLD -C/w Atorvastatin 20  mg po qHS  Hx of GIB x2 -2/2 to Ischemic Colitis; Was on Coumadin -Briefly discussed with her Primary Cardiologist Dr. Acie Fredrickson who felt that given her High CHADS2VASc she needed to be Anticoagulated -Will start Apixaban 5 mg po BID -Continue to  Monitor for S/Sx of Bleeding -Repeat CMP in AM   SSS s/p Dual Chamber Pacemaker -C/w Telemetry   DVT prophylaxis: Was on Enoxaparin but starting Anticoagulation with Apixaban Code Status: FULL CODE Family Communication: Discussed with Daughter at bedside  Disposition Plan: Obtain PT/OT Evaluation   Consultants:   Discussed case with Patient's Primary Cardiologist Dr. Mertie Moores but did not formally consult   Procedures:  ECHOCARDIOGRAM done and pending read   Antimicrobials:  Anti-infectives (From admission, onward)   Start     Dose/Rate Route Frequency Ordered Stop   06/03/17 1400  levofloxacin (LEVAQUIN) IVPB 500 mg     500 mg 100 mL/hr over 60 Minutes Intravenous Every 24 hours 06/02/17 1329 06/08/17 1359   06/02/17 1230  levofloxacin (LEVAQUIN) IVPB 500 mg     500 mg 100 mL/hr over 60 Minutes Intravenous  Once 06/02/17 1158 06/02/17 1543     Subjective: Seen and examined and felt about the same and was still SOB. States she has been coughing up mucous. Has never required O2 and this is something new for her. No nausea or vomiting.  Objective: Vitals:   06/03/17 0022 06/03/17 0407 06/03/17 1100 06/03/17 1357  BP: 111/60 (!) 119/46    Pulse: 74 78 78   Resp: 18 18 18    Temp: 98.4 F (36.9 C) 97.7 F (36.5 C)    TempSrc: Oral Oral    SpO2: 96% 96% 90% 94%  Weight:  73.3 kg (161 lb 9.6 oz)    Height:        Intake/Output Summary (Last 24 hours) at 06/03/2017 1826 Last data filed at 06/03/2017 1205 Gross per 24 hour  Intake 580 ml  Output 1100 ml  Net -520 ml   Filed Weights   06/02/17 1049 06/02/17 1852 06/03/17 0407  Weight: 73.5 kg (162 lb) 74.7 kg (164 lb 11.2 oz) 73.3 kg (161 lb 9.6 oz)   Examination: Physical Exam:  Constitutional: WN/WD Caucasian female inNAD and appears calm and comfortable Eyes: Lids and conjunctivae normal, sclerae anicteric  ENMT: External Ears, Nose appear normal. Grossly normal hearing. Mucous membranes are moist.    Neck: Appears normal, supple, no cervical masses, normal ROM, no appreciable thyromegaly, no JVD Respiratory: Diminished to auscultation bilaterally, no wheezing but had some rhonchi and mild crackles. Normal respiratory effort and patient is not tachypenic. No accessory muscle use. Wearing supplemental O2 via [place] Cardiovascular: Irregularly Irregular. no murmurs / rubs / gallops. S1 and S2 auscultated. Trace extremity edema.  Abdomen: Soft, non-tender, non-distended. No masses palpated. No appreciable hepatosplenomegaly. Bowel sounds positive x4.  GU: Deferred. Musculoskeletal: No clubbing / cyanosis of digits/nails. No joint deformity upper and lower extremities.   Skin: No rashes, lesions, ulcers on a limited skin eval. No induration; Warm and dry.  Neurologic: CN 2-12 grossly intact with no focal deficits.  Romberg sign and cerebellar reflexes not assessed.  Psychiatric: Normal judgment and insight. Alert and oriented x 3. Normal mood and appropriate affect.   Data Reviewed: I have personally reviewed following labs and imaging studies  CBC: Recent Labs  Lab 06/02/17 1123 06/02/17 1443 06/03/17 0453  WBC 12.1* 10.9* 11.8*  NEUTROABS  --   --  10.1*  HGB 9.4* 9.0* 10.1*  HCT 29.8* 28.1* 31.2*  MCV 93.4 93.0 92.3  PLT 291 281 161   Basic Metabolic Panel: Recent Labs  Lab 06/02/17 1123 06/02/17 1443 06/03/17 0453  NA 137  --  134*  K 4.4  --  3.7  CL 102  --  100*  CO2 24  --  20*  GLUCOSE 213*  --  182*  BUN 20  --  22*  CREATININE 1.48* 1.36* 1.36*  CALCIUM 8.5*  --  8.4*   GFR: Estimated Creatinine Clearance: 34 mL/min (A) (by C-G formula based on SCr of 1.36 mg/dL (H)). Liver Function Tests: No results for input(s): AST, ALT, ALKPHOS, BILITOT, PROT, ALBUMIN in the last 168 hours. No results for input(s): LIPASE, AMYLASE in the last 168 hours. No results for input(s): AMMONIA in the last 168 hours. Coagulation Profile: No results for input(s): INR, PROTIME in the  last 168 hours. Cardiac Enzymes: No results for input(s): CKTOTAL, CKMB, CKMBINDEX, TROPONINI in the last 168 hours. BNP (last 3 results) No results for input(s): PROBNP in the last 8760 hours. HbA1C: Recent Labs    06/02/17 1443  HGBA1C 7.6*   CBG: Recent Labs  Lab 06/02/17 2110 06/02/17 2153 06/03/17 0737 06/03/17 1134 06/03/17 1701  GLUCAP 214* 220* 160* 207* 141*   Lipid Profile: No results for input(s): CHOL, HDL, LDLCALC, TRIG, CHOLHDL, LDLDIRECT in the last 72 hours. Thyroid Function Tests: Recent Labs    06/02/17 1443  TSH 0.405   Anemia Panel: No results for input(s): VITAMINB12, FOLATE, FERRITIN, TIBC, IRON, RETICCTPCT in the last 72 hours. Sepsis Labs: Recent Labs  Lab 06/02/17 1148 06/02/17 1443 06/02/17 1456  LATICACIDVEN 2.50* 1.4 1.40    Recent Results (from the past 240 hour(s))  Culture, blood (routine x 2)     Status: None (Preliminary result)   Collection Time: 06/02/17 11:31 AM  Result Value Ref Range Status   Specimen Description BLOOD RIGHT ANTECUBITAL  Final   Special Requests   Final    BOTTLES DRAWN AEROBIC AND ANAEROBIC Blood Culture adequate volume   Culture   Final    NO GROWTH 1 DAY Performed at [facility], [place] [address] [place], [place] [address]    Report Status PENDING  Incomplete  Respiratory Panel by PCR     Status: None   Collection Time: 06/03/17 10:49 AM  Result Value Ref Range Status   Adenovirus NOT DETECTED NOT DETECTED Final   Coronavirus 229E NOT DETECTED NOT DETECTED Final   Coronavirus HKU1 NOT DETECTED NOT DETECTED Final   Coronavirus NL63 NOT DETECTED NOT DETECTED Final   Coronavirus OC43 NOT DETECTED NOT DETECTED Final   Metapneumovirus NOT DETECTED NOT DETECTED Final   Rhinovirus / Enterovirus NOT DETECTED NOT DETECTED Final   Influenza A NOT DETECTED NOT DETECTED Final   Influenza B NOT DETECTED NOT DETECTED Final   Parainfluenza Virus 1 NOT DETECTED NOT DETECTED Final   Parainfluenza Virus 2  NOT DETECTED NOT DETECTED Final   Parainfluenza Virus 3 NOT DETECTED NOT DETECTED Final   Parainfluenza Virus 4 NOT DETECTED NOT DETECTED Final   Respiratory Syncytial Virus NOT DETECTED NOT DETECTED Final   Bordetella pertussis NOT DETECTED NOT DETECTED Final   Chlamydophila pneumoniae NOT DETECTED NOT DETECTED Final   Mycoplasma pneumoniae NOT DETECTED NOT DETECTED Final    Comment: Performed at [facility], [place] [address], [place], [place] [address]    Radiology Studies: Dg Chest Port 1 View  Result Date: 06/02/2017 CLINICAL  DATA:  Shortness of breath. Chest tightness. Productive cough. EXAM: PORTABLE CHEST 1 VIEW COMPARISON:  03/30/2014 FINDINGS: There are new hazy infiltrates with interstitial accentuation in the right upper lobe and in the left mid and lower lung zones. Overall heart size and pulmonary vascularity are normal. Aortic atherosclerosis. No definitive pleural effusions.  No acute bone abnormality. IMPRESSION: New bilateral pulmonary infiltrates. Pneumonia should be considered, given the lack of pulmonary vascular congestion the. Aortic Atherosclerosis (ICD10-I70.0). Electronically Signed   By: Lorriane Shire M.D.   On: 06/02/2017 11:25   Scheduled Meds: . amiodarone  200 mg Oral Once per day on Mon Wed Fri  . atorvastatin  20 mg Oral Daily  . bromocriptine  2.5 mg Oral Daily  . enoxaparin (LOVENOX) injection  40 mg Subcutaneous Q24H  . guaiFENesin  1,200 mg Oral BID  . insulin aspart  0-15 Units Subcutaneous TID WC  . insulin aspart  0-5 Units Subcutaneous QHS  . ipratropium  0.5 mg Nebulization Q6H  . levalbuterol  0.63 mg Nebulization Q6H  . levothyroxine  88 mcg Oral Daily  . metoprolol tartrate  25 mg Oral BID   Continuous Infusions: . levofloxacin (LEVAQUIN) IV Stopped (06/03/17 1616)    LOS: 1 day   Kerney Elbe, DO [facility] Pager [phone]  If 7PM-7AM, please contact night-coverage [email or web address] Password [facility] 06/03/2017, 6:26 PM

## 2017-06-03 NOTE — Progress Notes (Signed)
ANTICOAGULATION CONSULT NOTE - Initial Consult  Pharmacy Consult for Apixaban Indication: atrial fibrillation  Allergies  Allergen Reactions  . Morphine And Related Itching, Nausea And Vomiting, Rash and Other (See Comments)    01/04/15- patient says she is NOT allergic to morphine.  Morphine IV given this AM 01/04/15 in the ED. No adverse reaction.  Celesta Gentile [Sitagliptin Phosphate] Other (See Comments)    Pt says this caused elevated liver enzymes and creatinine. IS TAKING 1/2 TABLET  . Actos [Pioglitazone Hydrochloride] Swelling    UNSPECIFIED REACTION   . Hydrocodone-Acetaminophen Itching, Nausea And Vomiting and Other (See Comments)    Per pt 05/12/15 she had this recently in the hospital with no issues at all  . Keflex [Cephalexin] Rash and Other (See Comments)    Burning sensation all over  . Percocet [Oxycodone-Acetaminophen] Nausea And Vomiting    Patient Measurements: Height: 5\' 4"  (162.6 cm) Weight: 161 lb 9.6 oz (73.3 kg)(scale c) IBW/kg (Calculated) : 54.7   Vital Signs: Pulse Rate: 78 (02/19 1100)  Labs: Recent Labs    06/02/17 1123 06/02/17 1443 06/03/17 0453  HGB 9.4* 9.0* 10.1*  HCT 29.8* 28.1* 31.2*  PLT 291 281 246  CREATININE 1.48* 1.36* 1.36*    Estimated Creatinine Clearance: 34 mL/min (A) (by C-G formula based on SCr of 1.36 mg/dL (H)).   Medical History: Past Medical History:  Diagnosis Date  . Anemia    hx  . Arthritis   . Carotid artery disease ([place])    a. Carotid duplex in 2010: 42-68% RICA, 34-19% LICA.  Marland Kitchen Chronic combined systolic and diastolic CHF (congestive heart failure) ([place])   . CKD (chronic kidney disease), stage III ([place])    no nephrologist   . Complication of anesthesia   . Diabetes mellitus    x 10 yrs  . GI bleed    a. 04/2012 - lower GIB - felt 2/2 mild ischemic colitis as shown on colonoscopy, was cleared for anticoag 1 week out from procedure.  Marland Kitchen Heart murmur    hx  . HTN (hypertension)   . Hyperlipemia   .  Hypothyroidism   . Ischemic colitis ([place])    a. 04/2012 - lower GIB - felt 2/2 mild ischemic colitis as shown on colonoscopy..  . PAF (paroxysmal atrial fibrillation) ([place])   . PONV (postoperative nausea and vomiting)   . 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 [place] DR  dual-chamber pacemaker 03/2014 by Dr. Rayann Heman for sick sinus/sx bradycardia  . Renal insufficiency      Assessment: 77yof with Hx Afib currently CVR on amiodarone and metoprolol.  Not on anticoagulation pta - previously on warfarin but stopped in 2016 after some rectal bleeding.  H/H low stable 10/31, pltc ok 200s, Cr 1.3, wt > 60kg, age < 77yo.    Goal of Therapy:   Monitor platelets by anticoagulation protocol: Yes   Plan:  Apixaban 5mg  BID Monitor for s/s bleeding  Bonnita Nasuti Pharm.D. CPP, BCPS Clinical Pharmacist [phone] 06/03/2017 6:53 PM

## 2017-06-04 ENCOUNTER — Inpatient Hospital Stay (HOSPITAL_COMMUNITY): Payer: Medicare Other

## 2017-06-04 DIAGNOSIS — J9601 Acute respiratory failure with hypoxia: Secondary | ICD-10-CM

## 2017-06-04 DIAGNOSIS — I480 Paroxysmal atrial fibrillation: Secondary | ICD-10-CM

## 2017-06-04 DIAGNOSIS — I5043 Acute on chronic combined systolic (congestive) and diastolic (congestive) heart failure: Secondary | ICD-10-CM

## 2017-06-04 DIAGNOSIS — N183 Chronic kidney disease, stage 3 (moderate): Secondary | ICD-10-CM

## 2017-06-04 LAB — BLOOD GAS, ARTERIAL
Acid-Base Excess: 0.4 mmol/L (ref 0.0–2.0)
Bicarbonate: 24.7 mmol/L (ref 20.0–28.0)
DRAWN BY: 313061
FIO2: 100
O2 SAT: 96.2 %
PATIENT TEMPERATURE: 98.6
pCO2 arterial: 41.2 mmHg (ref 32.0–48.0)
pH, Arterial: 7.396 (ref 7.350–7.450)
pO2, Arterial: 88.7 mmHg (ref 83.0–108.0)

## 2017-06-04 LAB — COMPREHENSIVE METABOLIC PANEL
ALT: 15 U/L (ref 14–54)
AST: 25 U/L (ref 15–41)
Albumin: 2.4 g/dL — ABNORMAL LOW (ref 3.5–5.0)
Alkaline Phosphatase: 112 U/L (ref 38–126)
Anion gap: 15 (ref 5–15)
BILIRUBIN TOTAL: 1.9 mg/dL — AB (ref 0.3–1.2)
BUN: 33 mg/dL — ABNORMAL HIGH (ref 6–20)
CHLORIDE: 96 mmol/L — AB (ref 101–111)
CO2: 22 mmol/L (ref 22–32)
CREATININE: 1.95 mg/dL — AB (ref 0.44–1.00)
Calcium: 8.2 mg/dL — ABNORMAL LOW (ref 8.9–10.3)
GFR calc Af Amer: 27 mL/min — ABNORMAL LOW (ref >60)
GFR, EST NON AFRICAN AMERICAN: 24 mL/min — AB (ref >60)
Glucose, Bld: 154 mg/dL — ABNORMAL HIGH (ref 65–99)
Potassium: 3.7 mmol/L (ref 3.5–5.1)
Sodium: 133 mmol/L — ABNORMAL LOW (ref 135–145)
TOTAL PROTEIN: 7.4 g/dL (ref 6.5–8.1)

## 2017-06-04 LAB — CBC WITH DIFFERENTIAL/PLATELET
Basophils Absolute: 0 10*3/uL (ref 0.0–0.1)
Basophils Relative: 0 %
EOS PCT: 1 %
Eosinophils Absolute: 0.2 10*3/uL (ref 0.0–0.7)
HEMATOCRIT: 29.7 % — AB (ref 36.0–46.0)
Hemoglobin: 9.6 g/dL — ABNORMAL LOW (ref 12.0–15.0)
LYMPHS ABS: 0.9 10*3/uL (ref 0.7–4.0)
LYMPHS PCT: 6 %
MCH: 29.9 pg (ref 26.0–34.0)
MCHC: 32.3 g/dL (ref 30.0–36.0)
MCV: 92.5 fL (ref 78.0–100.0)
MONO ABS: 0.5 10*3/uL (ref 0.1–1.0)
Monocytes Relative: 3 %
Neutro Abs: 14 10*3/uL — ABNORMAL HIGH (ref 1.7–7.7)
Neutrophils Relative %: 90 %
PLATELETS: 319 10*3/uL (ref 150–400)
RBC: 3.21 MIL/uL — AB (ref 3.87–5.11)
RDW: 14.9 % (ref 11.5–15.5)
WBC: 15.7 10*3/uL — AB (ref 4.0–10.5)

## 2017-06-04 LAB — GLUCOSE, CAPILLARY
GLUCOSE-CAPILLARY: 219 mg/dL — AB (ref 65–99)
GLUCOSE-CAPILLARY: 148 mg/dL — AB (ref 65–99)
Glucose-Capillary: 167 mg/dL — ABNORMAL HIGH (ref 65–99)
Glucose-Capillary: 140 mg/dL — ABNORMAL HIGH (ref 65–99)

## 2017-06-04 LAB — PHOSPHORUS: PHOSPHORUS: 3.2 mg/dL (ref 2.5–4.6)

## 2017-06-04 LAB — MAGNESIUM: MAGNESIUM: 1.5 mg/dL — AB (ref 1.7–2.4)

## 2017-06-04 LAB — HEMOGLOBIN A1C
Hgb A1c MFr Bld: 7.7 % — ABNORMAL HIGH (ref 4.8–5.6)
Mean Plasma Glucose: 174.29 mg/dL

## 2017-06-04 MED ORDER — FUROSEMIDE 10 MG/ML IJ SOLN: 40.0000 mg | Freq: Two times a day (BID) | INTRAMUSCULAR | Status: DC

## 2017-06-04 MED ORDER — FUROSEMIDE 10 MG/ML IJ SOLN
40.0000 mg | Freq: Two times a day (BID) | INTRAMUSCULAR | Status: DC
  Administered 2017-06-04 – 2017-06-08 (×8): 40 mg via INTRAVENOUS
  Filled 2017-06-04 (×8): qty 4

## 2017-06-04 MED ORDER — APIXABAN 2.5 MG PO TABS
2.5000 mg | ORAL_TABLET | Freq: Two times a day (BID) | ORAL | Status: DC
  Administered 2017-06-04: 2.5 mg via ORAL
  Filled 2017-06-04: qty 1

## 2017-06-04 MED ORDER — FUROSEMIDE 10 MG/ML IJ SOLN
40.0000 mg | Freq: Once | INTRAMUSCULAR | Status: AC
  Administered 2017-06-04: 20 mg via INTRAVENOUS
  Filled 2017-06-04: qty 4

## 2017-06-04 MED ORDER — LEVALBUTEROL HCL 0.63 MG/3ML IN NEBU
0.6300 mg | INHALATION_SOLUTION | RESPIRATORY_TRACT | Status: DC | PRN
  Administered 2017-06-05: 0.63 mg via RESPIRATORY_TRACT
  Filled 2017-06-04: qty 3

## 2017-06-04 MED ORDER — IPRATROPIUM BROMIDE 0.02 % IN SOLN
0.5000 mg | RESPIRATORY_TRACT | Status: DC | PRN
  Administered 2017-06-05: 0.5 mg via RESPIRATORY_TRACT
  Filled 2017-06-04: qty 2.5

## 2017-06-04 MED ORDER — MAGNESIUM SULFATE 2 GM/50ML IV SOLN
2.0000 g | Freq: Once | INTRAVENOUS | Status: AC
  Administered 2017-06-04: 2 g via INTRAVENOUS
  Filled 2017-06-04: qty 50

## 2017-06-04 NOTE — Progress Notes (Signed)
Pt still on nonrebreather. Pt desats with any activity to 80%. Cracklers heard up to clavicle. Pt states she is starting to feel tired. MD notified. No new orders.

## 2017-06-04 NOTE — Progress Notes (Signed)
Pt SpO2= 93% on nonrebreather mask.

## 2017-06-04 NOTE — Progress Notes (Signed)
NRB mask was removed from patient and patient O2 saturations immediately dropped to the upper 70's w/ a good pleth. Pt was placed on BiPAP for acute hypoxic respiratory failure. Pt appears to be comfortable no signs of increase WOB, accessory muscle usage, or respiratory compromise noted. BBS to auscultation is [place] and Coarse Crackles throughout. Family is at bedside. Pt is tolerating NIV well at this time.

## 2017-06-04 NOTE — Progress Notes (Addendum)
PROGRESS NOTE    Becky Forbes  ONG:[id number] DOB: 1940-12-16 DOA: 06/02/2017 PCP: Binnie Rail, MD   Chief Complaint  Patient presents with  . Shortness of Breath  . Atrial Fibrillation  . Chest Pain    Brief Narrative:  HPI on 06/02/2017 by Ms. [address] Becky Forbes is a 77 y.o. female with medical history significant of sick sinus syndrome with implanted pacemaker, paroxysmal atrial fibrillation, hypothyroidism, combined diastolic and systolic congestive heart failure, chronic kidney disease, hypothyroidism, diabetes mellitus who presented to the emergency department with complaints of worsening shortness of breath, cough productive of white sputum and chest tightness.  Patient reported that since December she was battling walled with cough and shortness of breath off and on and on Friday he was traveling to [place] to see his sister, had a lot of walking around and on the way back her daughter noticed that patient developed frequent coughs and some shortness of breath.  Since Friday her condition worsened and eventually this night she could not sleep at all and had to come to the ED for evaluation. She denied PND, orthopnea, chest pain, diaphoresis, lower extremity swelling, abdominal pain, she complained of nausea and dry heaves, but no vomiting.   Interim history Admitted for acute respiratory failure with hypoxia.  Patient found to have a reduced EF, cardiology consulted and appreciated.  Assessment & Plan   Acute respiratory failure with hypoxia  -Suspect multifactorial including systolic CHF exacerbation, atrial fibrillation versus flutter, suspected pneumonia -Continue supplemental oxygen to maintain saturations above 92% -Pulse ox currently reading 84% on 6L [place] -Patient placed on venturi mask, sats now 95% -Continue nebulizer treatments -CXR ordered -ABG ordered  Acute on chronic systolic CHF exacerbation -Echocardiogram showed EF of 30-35% (echocardiogram  December 2015 showed an EF of 50-55%) -BNP on admission 2340.8 -Will give patient IV lasix 20mg  and monitor -Monitor intake and output, daily weights  -cardiology consulted and appreciated  Sepsis secondary to Pneumonia -Chest x-ray on admission showed new bilateral pulmonary infiltrates- possible pneumonia. -upon admission, patient with leukocytosis, tachycardia and elevated lactic acid level -Continue Levaquin  Paroxysmal atrial fibrillation/atrial flutter -Patient was in normal sinus rhythm at previous cardiology visit per outpatient notes -CHADSVASC 6 -TSH 0.405 -Patient placed on Eliquis -Currently on amiodarone 200 mg 3 times a week -Cardiology consulted and appreciated  Acute kidney injury on chronic kidney disease, stage III -Creatinine currently 1.95, baseline  approximately 1.3-1.4 -Given the patient has CHF exacerbation, will place on gentle IV Lasix and monitor BMP closely  Essential hypertension -Ramipril held -Continue metoprolol  Diabetes mellitus, type II -Home Januvia and repaglinide held -Continue insulin sliding scale CBG monitoring -Hemoglobin A1c 7.7   Hypomagnesemia  -Magnesium 1.5, will replace with IV supplementation -Continue to monitor   Hypothyroidism -TSH 0.405 -Continue Synthroid  Hyperlipidemia -Continue statin  History of GI bleed -Secondary to ischemic colitis, patient was on Coumadin at that time -Case was briefly discussed with cardiologist, Dr. Acie Fredrickson, who recommended anticoagulation given her high CHADSVASC score -Closely monitor for signs and symptoms of bleeding -Continue to monitor CBC  Sick sinus syndrome -Has dual-chamber pacemaker, and currently on telemetry  DVT Prophylaxis  Eliquis  Code Status: Full  Family Communication: Family at bedside  Disposition Plan: Admitted, pending further recommendations from cardiology and improvement in breathing  Consultants Cardiology  Procedures  Echocardiogram  Antibiotics    Anti-infectives (From admission, onward)   Start     Dose/Rate Route Frequency Ordered Stop   06/03/17  1400  levofloxacin (LEVAQUIN) IVPB 500 mg     500 mg 100 mL/hr over 60 Minutes Intravenous Every 24 hours 06/02/17 1329 06/08/17 1359   06/02/17 1230  levofloxacin (LEVAQUIN) IVPB 500 mg     500 mg 100 mL/hr over 60 Minutes Intravenous  Once 06/02/17 1158 06/02/17 1543      Subjective:   Becky Forbes seen and examined today.  Patient denies current shortness of breath.  Feels that the oxygen saturation sensor is not picking up properly.  Denies chest pain, abdominal pain, nausea or vomiting, diarrhea or constipation, dizziness or headache.  Objective:   Vitals:   06/04/17 1100 06/04/17 1103 06/04/17 1105 06/04/17 1115  BP: (!) 107/57     Pulse: 94     Resp: 20     Temp:      TempSrc:      SpO2: (!) 84% (!) 79% (!) 84% 95%  Weight:      Height:        Intake/Output Summary (Last 24 hours) at 06/04/2017 1125 Last data filed at 06/04/2017 0500 Gross per 24 hour  Intake 120 ml  Output 100 ml  Net 20 ml   Filed Weights   06/02/17 1852 06/03/17 0407 06/04/17 0446  Weight: 74.7 kg (164 lb 11.2 oz) 73.3 kg (161 lb 9.6 oz) 72.2 kg (159 lb 3.2 oz)    Exam  General: Well developed, well nourished, NAD, appears stated age  HEENT: NCAT, mucous membranes moist.   Cardiovascular: S1 S2 auscultated, irregular  Respiratory: diminished breath sounds, no wheezing  Abdomen: Soft, nontender, nondistended, + bowel sounds  Extremities: warm dry without cyanosis clubbing. Trace LE edema  Neuro: AAOx3, Nonfocal  Psych: Appropriate mood and affect, pleasant    Data Reviewed: I have personally reviewed following labs and imaging studies  CBC: Recent Labs  Lab 06/02/17 1123 06/02/17 1443 06/03/17 0453 06/04/17 0700  WBC 12.1* 10.9* 11.8* 15.7*  NEUTROABS  --   --  10.1* 14.0*  HGB 9.4* 9.0* 10.1* 9.6*  HCT 29.8* 28.1* 31.2* 29.7*  MCV 93.4 93.0 92.3 92.5  PLT 291 281 246  505   Basic Metabolic Panel: Recent Labs  Lab 06/02/17 1123 06/02/17 1443 06/03/17 0453 06/04/17 0700  NA 137  --  134* 133*  K 4.4  --  3.7 3.7  CL 102  --  100* 96*  CO2 24  --  20* 22  GLUCOSE 213*  --  182* 154*  BUN 20  --  22* 33*  CREATININE 1.48* 1.36* 1.36* 1.95*  CALCIUM 8.5*  --  8.4* 8.2*  MG  --   --   --  1.5*  PHOS  --   --   --  3.2   GFR: Estimated Creatinine Clearance: 23.5 mL/min (A) (by C-G formula based on SCr of 1.95 mg/dL (H)). Liver Function Tests: Recent Labs  Lab 06/04/17 0700  AST 25  ALT 15  ALKPHOS 112  BILITOT 1.9*  PROT 7.4  ALBUMIN 2.4*   No results for input(s): LIPASE, AMYLASE in the last 168 hours. No results for input(s): AMMONIA in the last 168 hours. Coagulation Profile: No results for input(s): INR, PROTIME in the last 168 hours. Cardiac Enzymes: No results for input(s): CKTOTAL, CKMB, CKMBINDEX, TROPONINI in the last 168 hours. BNP (last 3 results) No results for input(s): PROBNP in the last 8760 hours. HbA1C: Recent Labs    06/02/17 1443 06/04/17 0700  HGBA1C 7.6* 7.7*   CBG: Recent Labs  Lab 06/03/17 0737 06/03/17 1134 06/03/17 1701 06/03/17 2151 06/04/17 0752  GLUCAP 160* 207* 141* 195* 167*   Lipid Profile: No results for input(s): CHOL, HDL, LDLCALC, TRIG, CHOLHDL, LDLDIRECT in the last 72 hours. Thyroid Function Tests: Recent Labs    06/02/17 1443  TSH 0.405   Anemia Panel: No results for input(s): VITAMINB12, FOLATE, FERRITIN, TIBC, IRON, RETICCTPCT in the last 72 hours. Urine analysis:    Component Value Date/Time   COLORURINE YELLOW 01/04/2015 1620   APPEARANCEUR CLEAR 01/04/2015 1620   LABSPEC 1.017 01/04/2015 1620   PHURINE 5.0 01/04/2015 1620   GLUCOSEU NEGATIVE 01/04/2015 1620   HGBUR TRACE (A) 01/04/2015 1620   BILIRUBINUR NEGATIVE 01/04/2015 1620   BILIRUBINUR negative 08/05/2012 1435   KETONESUR NEGATIVE 01/04/2015 1620   PROTEINUR NEGATIVE 01/04/2015 1620   UROBILINOGEN 0.2  01/04/2015 1620   NITRITE NEGATIVE 01/04/2015 1620   LEUKOCYTESUR TRACE (A) 01/04/2015 1620   Sepsis Labs: @LABRCNTIP (procalcitonin:4,lacticidven:4)  ) Recent Results (from the past 240 hour(s))  Culture, blood (routine x 2)     Status: None (Preliminary result)   Collection Time: 06/02/17 11:31 AM  Result Value Ref Range Status   Specimen Description BLOOD RIGHT ANTECUBITAL  Final   Special Requests   Final    BOTTLES DRAWN AEROBIC AND ANAEROBIC Blood Culture adequate volume   Culture   Final    NO GROWTH 1 DAY Performed at [facility], [place] [address] [place], [place] [address]    Report Status PENDING  Incomplete  Respiratory Panel by PCR     Status: None   Collection Time: 06/03/17 10:49 AM  Result Value Ref Range Status   Adenovirus NOT DETECTED NOT DETECTED Final   Coronavirus 229E NOT DETECTED NOT DETECTED Final   Coronavirus HKU1 NOT DETECTED NOT DETECTED Final   Coronavirus NL63 NOT DETECTED NOT DETECTED Final   Coronavirus OC43 NOT DETECTED NOT DETECTED Final   Metapneumovirus NOT DETECTED NOT DETECTED Final   Rhinovirus / Enterovirus NOT DETECTED NOT DETECTED Final   Influenza A NOT DETECTED NOT DETECTED Final   Influenza B NOT DETECTED NOT DETECTED Final   Parainfluenza Virus 1 NOT DETECTED NOT DETECTED Final   Parainfluenza Virus 2 NOT DETECTED NOT DETECTED Final   Parainfluenza Virus 3 NOT DETECTED NOT DETECTED Final   Parainfluenza Virus 4 NOT DETECTED NOT DETECTED Final   Respiratory Syncytial Virus NOT DETECTED NOT DETECTED Final   Bordetella pertussis NOT DETECTED NOT DETECTED Final   Chlamydophila pneumoniae NOT DETECTED NOT DETECTED Final   Mycoplasma pneumoniae NOT DETECTED NOT DETECTED Final    Comment: Performed at [facility], [place] [address], [place], [place] [address]      Radiology Studies: No results found.   Scheduled Meds: . amiodarone  200 mg Oral Once per day on Mon Wed Fri  . apixaban  5 mg Oral BID  . atorvastatin  20  mg Oral Daily  . bromocriptine  2.5 mg Oral Daily  . guaiFENesin  1,200 mg Oral BID  . insulin aspart  0-15 Units Subcutaneous TID WC  . insulin aspart  0-5 Units Subcutaneous QHS  . levothyroxine  88 mcg Oral Daily  . metoprolol tartrate  25 mg Oral BID   Continuous Infusions: . levofloxacin (LEVAQUIN) IV Stopped (06/03/17 1616)  . magnesium sulfate 1 - 4 g bolus IVPB 2 g (06/04/17 1033)     LOS: 2 days   Time Spent in minutes   45 minutes  [employer]  D.O. on 06/04/2017 at 11:25 AM  Between 7am to 7pm - Pager - [phone]  After 7pm go to [email or web address] - password [email or web address]  And look for the night coverage person covering for me after hours  [facility] Office  [phone]

## 2017-06-04 NOTE — Progress Notes (Addendum)
RN attempted to place pt on Venturi mask. Pt Sp02 dropped to 80%. RN placed pt back on nonrebreather mask. Pt Sp02 now 95%.  MD is aware.

## 2017-06-04 NOTE — Progress Notes (Signed)
Pt transferred to 6E20, still on NRB. Report given to [employer]

## 2017-06-04 NOTE — Plan of Care (Signed)
  Clinical Measurements: Respiratory complications will improve 06/04/2017 0031 - Progressing by Theador Hawthorne, RN   Clinical Measurements: Cardiovascular complication will be avoided 06/04/2017 0031 - Progressing by Theador Hawthorne, RN   Activity: Risk for activity intolerance will decrease 06/04/2017 0031 - Progressing by Theador Hawthorne, RN   Pain Managment: General experience of comfort will improve 06/04/2017 0031 - Progressing by Theador Hawthorne, RN

## 2017-06-04 NOTE — Progress Notes (Signed)
Unable to wean patient off NRB today, desats on venturi mask.  Also desats on NRB with any activity.  Patient more tired this evening.  Faint crackles through out.  Received lasix at 5pm.  Notified Dr Ree Kida of patient status.  Orders received for Bipap and Transfer to SDU.   RN aware, awaiting SDU bed assignment.

## 2017-06-04 NOTE — Progress Notes (Signed)
Pt placed on Venturi mask. SpO2= 83%. [facility] placed pt on nonrebreather mask. SpO2= 95%. RN gave pt 20 mg IV lasix per verbal MD order. Continuous pulse ox machine ordered for pt.

## 2017-06-04 NOTE — Progress Notes (Addendum)
Pt's SpO2= 80% on 4L. RN increased O2 to 6L. SpO2= 83%. Pt states she is not having difficulty breathing. RN called [facility] to assess pt.

## 2017-06-04 NOTE — Consult Note (Addendum)
Cardiology Consultation:   Patient ID: Becky Forbes; [id number]; 25-Jul-1940   Admit date: 06/02/2017 Date of Consult: 06/04/2017  Primary Care Provider: Binnie Rail, MD Primary Cardiologist: Dr. Acie Fredrickson   Patient Profile:   Becky Forbes is a 77 y.o. female with a hx of PAF and aflutter on amiodarone (not on anticoagulation secondary to GI bleed), NICM (EF 50-55% by echo 03/2014, now 30-35% per echo 06/03/17), sick sinus syndrome s/p pacemaker (2015), DMII, HTN, HLD, h/o GI bleed on coumadin, CKD stage III (baseline -1.3), and carotid disease by duplex 2010 who is being seen today for the evaluation of atrial fibrillation at the request of Dr. Ree Kida.   History of Present Illness:   Becky Forbes is a pleasant 77yo F who presented to [facility] on 06/02/17 with c/o of worsening SOB, productive cough, and chest tightness. She states that her initial dyspnea and severe fatigue began shortly after starting Amiodarone about 3 years ago. It appears from office visit review that there have been some adjustments in the dosing given her symptoms and concerns. She reports that she has recently been fighting off an upper respiratory infection since December which has additionally worsened her symptoms. She states that in the last week she has been unable to walk short distances without becoming increasingly short of breath, to the point that she will have to sit down to catch her breath. She reports that along with the respiratory symptoms, she has been having chest tightness that has been constant in nature and is directly associated with her oxygen level and WOB. She denies taking any medications for her symptoms, no reports of PND, orthopnea, diaphoresis, LE swelling, neck or back pain, abdominal pain, and no symptom radiation, however has had some nausea and dry heaves, but no vomiting. On 06/02/17 she proceeded to the ED for further evaluation.  In the ED, she was found to be tachycardic with a HR in the  110-120's and tachypnic. Her WBC count was elevated at >12,000 and her creatinine was increased at 1.36 , but near her baseline of 1.6. She had an elevated lactic acid and BNP greater than 2300. A troponin level was drawn and was 0.04. Her CXR revealed bilateral basilar consolidations concerning for PNA. Her EKG showed atrial flutter with no ST-T wave changes.   She was last seen by Dr. Acie Fredrickson 02/03/2017 where she had some complaints of ongoing fatigue and dyspnea. At that time, her Amiodarone administration times were adjusted to a M, W, F schedule and her metoprolol was decreased to 25mg  PO QD in hopes to help her symptoms, but unfortunately, without success.   Past Medical History:  Diagnosis Date  . Anemia    hx  . Arthritis   . Carotid artery disease ([place])    a. Carotid duplex in 2010: 87-86% RICA, 76-72% LICA.  Marland Kitchen Chronic combined systolic and diastolic CHF (congestive heart failure) ([place])   . CKD (chronic kidney disease), stage III ([place])    no nephrologist   . Complication of anesthesia   . Diabetes mellitus    x 10 yrs  . GI bleed    a. 04/2012 - lower GIB - felt 2/2 mild ischemic colitis as shown on colonoscopy, was cleared for anticoag 1 week out from procedure.  Marland Kitchen Heart murmur    hx  . HTN (hypertension)   . Hyperlipemia   . Hypothyroidism   . Ischemic colitis ([place])    a. 04/2012 - lower GIB - felt 2/2  mild ischemic colitis as shown on colonoscopy..  . PAF (paroxysmal atrial fibrillation) ([place])   . PONV (postoperative nausea and vomiting)   . 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 [place] DR  dual-chamber pacemaker 03/2014 by Dr. Rayann Heman for sick sinus/sx bradycardia  . Renal insufficiency     Past Surgical History:  Procedure Laterality Date  . ABDOMINAL HYSTERECTOMY    . APPENDECTOMY    . BACK SURGERY    . CARPAL TUNNEL RELEASE Right   . CATARACT EXTRACTION  05/25/2007   RIGHT EYE,left  . COLONOSCOPY  01/31/2009   small  external/internal hemorrhoids, diverticulosis in sigmoid colon, one small polyp (biopsied). Biospy essentially normal. Dr.Magod  . COLONOSCOPY  04/30/2012   Procedure: COLONOSCOPY;  Surgeon: Cleotis Nipper, MD;  Location: [facility] ENDOSCOPY;  Service: Endoscopy;  Laterality: N/A;  unprepped, poss flex only  . FOOT SURGERY Left 2006   MID FOOT RECONSTRUCTION  . GANGLION CYST EXCISION Right 12/2007   Dr.Gramig hand  . JOINT REPLACEMENT     bilateral  . LUMBAR LAMINECTOMY     x3 one fusion  . OSTEOTOMY Left 2006   AND FUSION  . PARS PLANA VITRECTOMY W/ REPAIR OF MACULAR HOLE Right 2010  . PERMANENT PACEMAKER INSERTION N/A 03/29/2014   STJ [employer]urity dual chamber paceamker implnated by Dr Rayann Heman  . TOTAL KNEE ARTHROPLASTY Bilateral 05/02/08, 07/25/08   Dr.Alusio     Prior to Admission medications   Medication Sig Start Date End Date Taking? Authorizing Provider  acetaminophen (TYLENOL) 325 MG tablet Take 650 mg by mouth every 6 (six) hours as needed for moderate pain.   Yes [provider]  amiodarone (PACERONE) 200 MG tablet Take 200 mg by mouth 3 (three) times a week.   Yes [provider]  atorvastatin (LIPITOR) 20 MG tablet TAKE 1 TABLET BY MOUTH  DAILY Patient taking differently: TAKE 20 mg TABLET BY MOUTH  DAILY 12/05/16  Yes Eber Ferrufino, Wonda Cheng, MD  bromocriptine (PARLODEL) 2.5 MG tablet TAKE 1 TABLET BY MOUTH  DAILY Patient taking differently: TAKE 2.5 mg  TABLET BY MOUTH  DAILY 04/25/16  Yes Renato Shin, MD  cholecalciferol (VITAMIN D) 400 units TABS tablet Take 800 Units by mouth daily.   Yes [provider]  furosemide (LASIX) 20 MG tablet TAKE 1 TABLET BY MOUTH  DAILY AS NEEDED Patient taking differently: TAKE 20 mg TABLET BY MOUTH  DAILY AS NEEDED 12/05/16  Yes Imraan Wendell, Wonda Cheng, MD  JANUVIA 100 MG tablet TAKE ONE-HALF TABLET BY  MOUTH DAILY Patient taking differently: TAKE 50 mg  TABLET BY  MOUTH DAILY 02/21/17  Yes Renato Shin, MD  levothyroxine (SYNTHROID,  LEVOTHROID) 88 MCG tablet Take 1 tablet (88 mcg total) daily by mouth. 02/25/17  Yes Burns, Claudina Lick, MD  metoprolol tartrate (LOPRESSOR) 25 MG tablet Take 1 tablet (25 mg total) by mouth 2 (two) times daily. 02/03/17 06/02/17 Yes Geanette Buonocore, Wonda Cheng, MD  ramipril (ALTACE) 5 MG capsule TAKE 1 CAPSULE BY MOUTH TWO TIMES DAILY Patient taking differently: TAKE 5 mg  CAPSULE BY MOUTH TWO TIMES DAILY 08/08/16  Yes Burns, Claudina Lick, MD  repaglinide (PRANDIN) 2 MG tablet 1 1/2 tabs with breakfast, 1 1/2 with lunch, and 1/2 with supper Patient taking differently: Take 1-3 mg by mouth See admin instructions. 3 mg  tabs with breakfast, 3 mg  with lunch, and 1 mg with supper 05/09/17  Yes Renato Shin, MD    Inpatient Medications: Scheduled  Meds: . amiodarone  200 mg Oral Once per day on Mon Wed Fri  . apixaban  5 mg Oral BID  . atorvastatin  20 mg Oral Daily  . bromocriptine  2.5 mg Oral Daily  . guaiFENesin  1,200 mg Oral BID  . insulin aspart  0-15 Units Subcutaneous TID WC  . insulin aspart  0-5 Units Subcutaneous QHS  . levothyroxine  88 mcg Oral Daily  . metoprolol tartrate  25 mg Oral BID   Continuous Infusions: . levofloxacin (LEVAQUIN) IV Stopped (06/03/17 1616)   PRN Meds: acetaminophen, ipratropium, levalbuterol, ondansetron **OR** ondansetron (ZOFRAN) IV, polyethylene glycol  Allergies:    Allergies  Allergen Reactions  . Morphine And Related Itching, Nausea And Vomiting, Rash and Other (See Comments)    01/04/15- patient says she is NOT allergic to morphine.  Morphine IV given this AM 01/04/15 in the ED. No adverse reaction.  Celesta Gentile [Sitagliptin Phosphate] Other (See Comments)    Pt says this caused elevated liver enzymes and creatinine. IS TAKING 1/2 TABLET  . Actos [Pioglitazone Hydrochloride] Swelling    UNSPECIFIED REACTION   . Hydrocodone-Acetaminophen Itching, Nausea And Vomiting and Other (See Comments)    Per pt 05/12/15 she had this recently in the hospital with no issues at  all  . Keflex [Cephalexin] Rash and Other (See Comments)    Burning sensation all over  . Percocet [Oxycodone-Acetaminophen] Nausea And Vomiting    Social History:   Social History   Socioeconomic History  . Marital status: Married    Spouse name: JAMES  . Number of children: 2  . Years of education: Not on file  . Highest education level: Not on file  Social Needs  . Financial resource strain: Not on file  . Food insecurity - worry: Not on file  . Food insecurity - inability: Not on file  . Transportation needs - medical: Not on file  . Transportation needs - non-medical: Not on file  Occupational History  . Occupation: RETIRED RN    Employer: [employer]    Comment: RETIRED IN 2007  Tobacco Use  . Smoking status: Never Smoker  . Smokeless tobacco: Never Used  Substance and Sexual Activity  . Alcohol use: No  . Drug use: No  . Sexual activity: Not Currently  Other Topics Concern  . Not on file  Social History Narrative  . Not on file    Family History:   Family History  Problem Relation Age of Onset  . Coronary artery disease Father   . Hypertension Father   . Stroke Father   . Diabetes Father   . Kidney failure Mother   . Hypertension Mother   . Other Brother        SEPSIS  . Diabetes Brother   . Diabetes Sister   . Kidney failure Sister    Family Status:  Family Status  Relation Name Status  . Father  Deceased at age 54       "RUPTURED HEART"  . Mother  Deceased at age 39       RENAL FAILURE  . Brother  Deceased at age 73  . Brother  Alive  . Sister  Deceased at age 72    ROS:  Please see the history of present illness.  All other ROS reviewed and negative.     Physical Exam/Data:   Vitals:   06/04/17 1103 06/04/17 1105 06/04/17 1115 06/04/17 1200  BP:    (!) 114/43  Pulse:    72  Resp:    (!) 22  Temp:    98 F (36.7 C)  TempSrc:    Oral  SpO2: (!) 79% (!) 84% 95% 96%  Weight:      Height:        Intake/Output  Summary (Last 24 hours) at 06/04/2017 1302 Last data filed at 06/04/2017 1202 Gross per 24 hour  Intake 0 ml  Output 150 ml  Net -150 ml   Filed Weights   06/02/17 1852 06/03/17 0407 06/04/17 0446  Weight: 164 lb 11.2 oz (74.7 kg) 161 lb 9.6 oz (73.3 kg) 159 lb 3.2 oz (72.2 kg)   Body mass index is 27.33 kg/m.   General: Well developed, well nourished, NAD Skin: Warm, dry, intact  Head: Normocephalic, atraumatic,  clear, moist mucus membranes. Neck: Negative for carotid bruits. No JVD Lungs:+ wheezes, rales, and rhonchi in BLL. Breathing is mildly labored in NRB. O2 saturations 94% Cardiovascular: Irregularly irregular with S1 S2. No murmurs, rubs, or gallops Abdomen: Soft, non-tender, non-distended with normoactive bowel sounds. No obvious abdominal masses. MSK: Strength and tone appear normal for age. 5/5 in all extremities Extremities: No edema. No clubbing or cyanosis. DP/PT pulses 1+ bilaterally Neuro: Alert and oriented. No focal deficits. No facial asymmetry. MAE spontaneously. Psych: Responds to questions appropriately with normal affect.     EKG:  The EKG was personally reviewed and demonstrates:  06/02/17 Atrial flutter w/ HR 87 and T-wave inversions in leads V4-V6, comparable to previous tracings  Telemetry:  Telemetry was personally reviewed and demonstrates: 06/04/17  Atrial fibrillation HR 92  Relevant CV Studies:  ECHO: 06/03/17 Study Conclusions  - Left ventricle: The cavity size was mildly dilated. Systolic   function was moderately to severely reduced. The estimated   ejection fraction was in the range of 30% to 35%. Diffuse   hypokinesis. - Aortic valve: Noncoronary cusp mobility was restricted.   Transvalvular velocity was within the normal range. There was no   stenosis. There was no regurgitation. - Mitral valve: Mildly calcified annulus. Transvalvular velocity   was within the normal range. There was no evidence for stenosis.   There was mild to  moderate regurgitation. - Left atrium: The atrium was severely dilated. - Right ventricle: The cavity size was normal. Wall thickness was   normal. Systolic function was normal. - Right atrium: The atrium was severely dilated. - Atrial septum: No defect or patent foramen ovale was identified   by color flow Doppler. - Tricuspid valve: There was mild regurgitation. - Pulmonary arteries: Systolic pressure was within the normal   range. PA peak pressure: 27 mm Hg (S).  Impressions:  - Compared with the echo 16/10/96, systolic function is reduced.  Echo 03/28/14:  Study Conclusions  - Left ventricle: Posterior basal hypokinesis. The cavity size was mildly dilated. Wall thickness was normal. Systolic function was normal. The estimated ejection fraction was in the range of 50% to 55%. Doppler parameters are consistent with elevated ventricular end-diastolic filling pressure. - Mitral valve: Calcified annulus. Mildly thickened leaflets . There was mild regurgitation. - Left atrium: The atrium was severely dilated. - Right atrium: The atrium was moderately dilated. - Atrial septum: No defect or patent foramen ovale was identified. - Impressions: PA pressure elevated base on TR velocity.  Impressions:  - PA pressure elevated base on TR velocity.  CATH: NA  Laboratory Data:  Chemistry Recent Labs  Lab 06/02/17 1123 06/02/17 1443 06/03/17 0453 06/04/17 0700  NA  137  --  134* 133*  K 4.4  --  3.7 3.7  CL 102  --  100* 96*  CO2 24  --  20* 22  GLUCOSE 213*  --  182* 154*  BUN 20  --  22* 33*  CREATININE 1.48* 1.36* 1.36* 1.95*  CALCIUM 8.5*  --  8.4* 8.2*  GFRNONAA 33* 36* 36* 24*  GFRAA 38* 42* 42* 27*  ANIONGAP 11  --  14 15    Total Protein  Date Value Ref Range Status  06/04/2017 7.4 6.5 - 8.1 g/dL Final  02/03/2017 7.5 6.0 - 8.5 g/dL Final   Albumin  Date Value Ref Range Status  06/04/2017 2.4 (L) 3.5 - 5.0 g/dL Final  02/03/2017 4.0 3.5 - 4.8  g/dL Final   AST  Date Value Ref Range Status  06/04/2017 25 15 - 41 U/L Final   ALT  Date Value Ref Range Status  06/04/2017 15 14 - 54 U/L Final   Alkaline Phosphatase  Date Value Ref Range Status  06/04/2017 112 38 - 126 U/L Final   Total Bilirubin  Date Value Ref Range Status  06/04/2017 1.9 (H) 0.3 - 1.2 mg/dL Final   Bilirubin Total  Date Value Ref Range Status  02/03/2017 1.2 0.0 - 1.2 mg/dL Final   Hematology Recent Labs  Lab 06/02/17 1443 06/03/17 0453 06/04/17 0700  WBC 10.9* 11.8* 15.7*  RBC 3.02* 3.38* 3.21*  HGB 9.0* 10.1* 9.6*  HCT 28.1* 31.2* 29.7*  MCV 93.0 92.3 92.5  MCH 29.8 29.9 29.9  MCHC 32.0 32.4 32.3  RDW 14.9 14.8 14.9  PLT 281 246 319   Cardiac EnzymesNo results for input(s): TROPONINI in the last 168 hours.  Recent Labs  Lab 06/02/17 1146  TROPIPOC 0.04    BNP Recent Labs  Lab 06/02/17 1123  BNP 2,340.8*    DDimer No results for input(s): DDIMER in the last 168 hours. TSH:  Lab Results  Component Value Date   TSH 0.405 06/02/2017   Lipids: Lab Results  Component Value Date   CHOL 124 02/03/2017   HDL 36 (L) 02/03/2017   LDLCALC 70 02/03/2017   TRIG 90 02/03/2017   CHOLHDL 3.4 02/03/2017   HgbA1c: Lab Results  Component Value Date   HGBA1C 7.7 (H) 06/04/2017    Radiology/Studies:  Dg Chest Port 1 View  Result Date: 06/02/2017 CLINICAL DATA:  Shortness of breath. Chest tightness. Productive cough. EXAM: PORTABLE CHEST 1 VIEW COMPARISON:  03/30/2014 FINDINGS: There are new hazy infiltrates with interstitial accentuation in the right upper lobe and in the left mid and lower lung zones. Overall heart size and pulmonary vascularity are normal. Aortic atherosclerosis. No definitive pleural effusions.  No acute bone abnormality. IMPRESSION: New bilateral pulmonary infiltrates. Pneumonia should be considered, given the lack of pulmonary vascular congestion the. Aortic Atherosclerosis (ICD10-I70.0). Electronically Signed   By:  Lorriane Shire M.D.   On: 06/02/2017 11:25    Assessment and Plan:   1. Atrial fibrillation/atrial flutter: -NSR at last office visit in 01/2017, in atrial fibrillation with rate control today -Will discontinue Amiodarone for now given her ongoing respiratory symptoms and aim for rate control for her PAF -Continue metoprolol 25mg  PO BID  -Will decrease Eliquis to 2.5mg  PO BID for stroke protection. We are aware that she has a hx of GI bleed and will monitor very closely.  -CBC in AM -TSH, 0.405>> on Synthroid  -CHA2DS2VASc = 6  2. Chronic systolic heart failure: -182XH today>>>162lb on  admission. Weight at last visit 170lb -I&O, Net positive 272ml today  -One time dose IV Lasix 40mg  today>>we will increase this to Lasix IV 40mg  BID and closely monitor her renal function with daily BMET -Given drop in EF to 30-35% per echo 06/03/17 (down from 50-55% in 2015), will need an ischemic evaluation at some point, however this will need to be completed once she is over this acute respiratory illness  3. CKD Stage III: -Cr, 1.95. 1.48>1.36>1.36>1.95. Baseline appears to be 1.2-1.6 -Given HF exacerbation, IV Lasix increased to 40mg  BID. We will watch her response closely and re-access tomorrow.   -Avoid nephrotoxic medications  4. Hypoxic respiratory failure: -Per primary  -Continue abx   5. DMII: -Home januvia -Per IM  -SSI   4. HTN: -Stable, 114/43>107/57>115/59 -Well controlled at last office visit  -Home ramipril held  5. HLD: -Stable, last lipid panel, CHO-124, HDL-36, LDL-70, Trig-90 -On Lipitor   6. Carotid artery disease: -Risk factor modification, statin, BB    For questions or updates, please contact [place] Please consult [email or web address] for contact info under Cardiology/STEMI.   Lyndel Safe NP-C [facility] Pager: [phone] 06/04/2017 1:02 PM  Attending Note:   The patient was seen and examined.  Agree with assessment and plan as noted  above.  Changes made to the above note as needed.  Patient seen and independently examined with Kathyrn Drown, NP .   We discussed all aspects of the encounter. I agree with the assessment and plan as stated above.  Becky Forbes is an extremely complicated patient  1.  Acute on chronic combined systolic and diastolic congestive heart failure: Becky Forbes presents with symptoms of worsening shortness of breath for the past several weeks.  Recent echocardiogram shows a decrease in her left ventricular systolic function with an EF currently around 25-30%. She is been compliant with her medications.  Her actual weight is down from several weeks ago but she her chest x-ray clearly looks volume overloaded. Increased JVD on exam.  I think that she needs to be further diuresed.  We will need to watch her renal function closely  2.  Atrial fibrillation: The patient is back in atrial fibrillation.  We have tried low-dose amiodarone.  We had decreased the dose of amiodarone in hopes of avoiding amiodarone toxicity.  She has had some degree of shortness of breath ever since starting the amiodarone.  At this point I am not convinced that it is doing her any good.  To new metoprolol. She has a history of a GI bleed in the past and we have avoided using Coumadin.  I think that we could get away with using low-dose Eliquis at 2.5 mg twice a day.  This should provide her with some protection from thromboembolic disease.  3.  Chronic kidney disease stage III: Creatinine is 1.95.  Her baseline appears to be 1.2-1.6.  I think that she needs to be diuresed and its possible that her creatinine might improve with diuresis.  We will continue to follow her basic basic metabolic profile very closely.    I have spent a total of 40 minutes with patient reviewing hospital  notes , telemetry, EKGs, labs and examining patient as well as establishing an assessment and plan that was discussed with the patient. > 50% of time was spent in  direct patient care.    Thayer Headings, Brooke Bonito., MD, [facility] 06/04/2017, 5:06 PM [address]  [place] Pager [phone]

## 2017-06-04 NOTE — Progress Notes (Signed)
Patient with mild SOB, states it is better than when she was admitted.  O2 sats 84% on 6L [place],  Confirmed with additional device.  Placed patient on 55% Venturi, no improvement with O2 sat.  While changing O2 devices pt desat to 79%.  Placed on 100% NRB.  O2 stats improved to 95%.  BP 107/57 AF 94  RR 20.

## 2017-06-05 ENCOUNTER — Encounter (HOSPITAL_COMMUNITY): Payer: Medicare Other

## 2017-06-05 ENCOUNTER — Inpatient Hospital Stay (HOSPITAL_COMMUNITY): Payer: Medicare Other

## 2017-06-05 DIAGNOSIS — I5021 Acute systolic (congestive) heart failure: Secondary | ICD-10-CM

## 2017-06-05 DIAGNOSIS — I5023 Acute on chronic systolic (congestive) heart failure: Secondary | ICD-10-CM

## 2017-06-05 LAB — HEPARIN LEVEL (UNFRACTIONATED): Heparin Unfractionated: >2.2 IU/mL — ABNORMAL HIGH (ref 0.30–0.70)

## 2017-06-05 LAB — BASIC METABOLIC PANEL
Anion gap: 13 (ref 5–15)
BUN: 36 mg/dL — AB (ref 6–20)
CALCIUM: 8.1 mg/dL — AB (ref 8.9–10.3)
CO2: 24 mmol/L (ref 22–32)
CREATININE: 1.8 mg/dL — AB (ref 0.44–1.00)
Chloride: 97 mmol/L — ABNORMAL LOW (ref 101–111)
GFR calc Af Amer: 30 mL/min — ABNORMAL LOW (ref >60)
GFR, EST NON AFRICAN AMERICAN: 26 mL/min — AB (ref >60)
GLUCOSE: 150 mg/dL — AB (ref 65–99)
Potassium: 3.3 mmol/L — ABNORMAL LOW (ref 3.5–5.1)
SODIUM: 134 mmol/L — AB (ref 135–145)

## 2017-06-05 LAB — D-DIMER, QUANTITATIVE: D-Dimer, Quant: 2.43 ug/mL-FEU — ABNORMAL HIGH (ref 0.00–0.50)

## 2017-06-05 LAB — CBC
HCT: 29.2 % — ABNORMAL LOW (ref 36.0–46.0)
Hemoglobin: 9.4 g/dL — ABNORMAL LOW (ref 12.0–15.0)
MCH: 29.4 pg (ref 26.0–34.0)
MCHC: 32.2 g/dL (ref 30.0–36.0)
MCV: 91.3 fL (ref 78.0–100.0)
PLATELETS: 308 10*3/uL (ref 150–400)
RBC: 3.2 MIL/uL — ABNORMAL LOW (ref 3.87–5.11)
RDW: 14.8 % (ref 11.5–15.5)
WBC: 15.2 10*3/uL — AB (ref 4.0–10.5)

## 2017-06-05 LAB — MRSA PCR SCREENING: MRSA BY PCR: NEGATIVE

## 2017-06-05 LAB — GLUCOSE, CAPILLARY
GLUCOSE-CAPILLARY: 167 mg/dL — AB (ref 65–99)
GLUCOSE-CAPILLARY: 108 mg/dL — AB (ref 65–99)
Glucose-Capillary: 153 mg/dL — ABNORMAL HIGH (ref 65–99)
Glucose-Capillary: 92 mg/dL (ref 65–99)

## 2017-06-05 LAB — APTT: APTT: 65 s — AB (ref 24–36)

## 2017-06-05 LAB — MAGNESIUM: MAGNESIUM: 1.8 mg/dL (ref 1.7–2.4)

## 2017-06-05 MED ORDER — POTASSIUM CHLORIDE 10 MEQ/100ML IV SOLN: 10.0000 meq | Freq: Once | INTRAVENOUS | Status: DC

## 2017-06-05 MED ORDER — MAGNESIUM SULFATE 2 GM/50ML IV SOLN
2.0000 g | Freq: Once | INTRAVENOUS | Status: AC
  Administered 2017-06-05: 2 g via INTRAVENOUS
  Filled 2017-06-05: qty 50

## 2017-06-05 MED ORDER — POTASSIUM CHLORIDE CRYS ER 20 MEQ PO TBCR
40.0000 meq | EXTENDED_RELEASE_TABLET | Freq: Once | ORAL | Status: AC
  Administered 2017-06-05: 40 meq via ORAL
  Filled 2017-06-05: qty 2

## 2017-06-05 MED ORDER — POTASSIUM CHLORIDE 10 MEQ/100ML IV SOLN
10.0000 meq | INTRAVENOUS | Status: DC
  Administered 2017-06-05: 10 meq via INTRAVENOUS
  Filled 2017-06-05: qty 100

## 2017-06-05 MED ORDER — POTASSIUM CHLORIDE 10 MEQ/100ML IV SOLN: 10.0000 meq | INTRAVENOUS | Status: DC

## 2017-06-05 MED ORDER — POTASSIUM CHLORIDE CRYS ER 20 MEQ PO TBCR: 40.0000 meq | EXTENDED_RELEASE_TABLET | Freq: Once | ORAL | Status: DC

## 2017-06-05 MED ORDER — HEPARIN (PORCINE) IN NACL 100-0.45 UNIT/ML-% IJ SOLN
1150.0000 [IU]/h | INTRAMUSCULAR | Status: AC
  Administered 2017-06-05: 1000 [IU]/h via INTRAVENOUS
  Administered 2017-06-06 – 2017-06-09 (×5): 1150 [IU]/h via INTRAVENOUS
  Filled 2017-06-05 (×7): qty 250

## 2017-06-05 MED ORDER — METOPROLOL TARTRATE 5 MG/5ML IV SOLN
5.0000 mg | Freq: Four times a day (QID) | INTRAVENOUS | Status: DC
  Administered 2017-06-05 – 2017-06-08 (×11): 5 mg via INTRAVENOUS
  Filled 2017-06-05 (×12): qty 5

## 2017-06-05 MED ORDER — METOPROLOL TARTRATE 5 MG/5ML IV SOLN: 2.5000 mg | Freq: Four times a day (QID) | INTRAVENOUS | Status: DC

## 2017-06-05 MED ORDER — LEVOTHYROXINE SODIUM 100 MCG IV SOLR
44.0000 ug | Freq: Every day | INTRAVENOUS | Status: DC
  Administered 2017-06-05 – 2017-06-12 (×8): 44 ug via INTRAVENOUS
  Filled 2017-06-05 (×8): qty 5

## 2017-06-05 MED ORDER — POTASSIUM CHLORIDE 10 MEQ/100ML IV SOLN
10.0000 meq | INTRAVENOUS | Status: AC
  Administered 2017-06-05 (×2): 10 meq via INTRAVENOUS
  Filled 2017-06-05 (×2): qty 100

## 2017-06-05 NOTE — Progress Notes (Signed)
ANTICOAGULATION CONSULT NOTE - Follow Up Consult  Pharmacy Consult for Heparin Indication: atrial fibrillation and rule out PE  Patient Measurements: Height: 5\' 4"  (162.6 cm) Weight: 159 lb 13.3 oz (72.5 kg) IBW/kg (Calculated) : 54.7 Heparin Dosing Weight: 72.5 kg  Vital Signs: Temp: 98.4 F (36.9 C) (02/21 2051) Temp Source: Axillary (02/21 2051) BP: 132/69 (02/21 2051) Pulse Rate: 93 (02/21 2051)  Labs: Recent Labs    06/03/17 0453 06/04/17 0700 06/05/17 0438 06/05/17 1814  HGB 10.1* 9.6* 9.4*  --   HCT 31.2* 29.7* 29.2*  --   PLT 246 319 308  --   APTT  --   --   --  65*  HEPARINUNFRC  --   --   --  >2.20*  CREATININE 1.36* 1.95* 1.80*  --     Estimated Creatinine Clearance: 25.5 mL/min (A) (by C-G formula based on SCr of 1.8 mg/dL (H)).  Assessment:   77 yr old female on Eliquis for afib prior to admission. Last doe 2/20 at 10pm.   Transitioned to IV heparin today for rule out PE.  VQ scan pending.     APTT is 65 seconds and heparin level >2.20 on 1000 units/hr.    APTT just below target range. Heparin level falsely elevated due to recent Eliquis doses.  Goal of Therapy:  Heparin level 0.3-0.7 units/ml aPTT 66-102 seconds Monitor platelets by anticoagulation protocol: Yes   Plan:   Increase heparin drip to 1150 units/hr.  Next aPTT, heparin level and CBC in am.  Follow up VQ scan.  Eliquis on hold.  Arty Baumgartner, [place] Pager: [phone] 06/05/2017,9:05 PM

## 2017-06-05 NOTE — Progress Notes (Signed)
PROGRESS NOTE    Becky Forbes  BTD:[id number] DOB: 1940/09/21 DOA: 06/02/2017 PCP: Binnie Rail, MD   Chief Complaint  Patient presents with  . Shortness of Breath  . Atrial Fibrillation  . Chest Pain    Brief Narrative:  HPI on 06/02/2017 by Ms. [address] Becky Forbes is a 77 y.o. female with medical history significant of sick sinus syndrome with implanted pacemaker, paroxysmal atrial fibrillation, hypothyroidism, combined diastolic and systolic congestive heart failure, chronic kidney disease, hypothyroidism, diabetes mellitus who presented to the emergency department with complaints of worsening shortness of breath, cough productive of white sputum and chest tightness.  Patient reported that since December she was battling walled with cough and shortness of breath off and on and on Friday he was traveling to [place] to see his sister, had a lot of walking around and on the way back her daughter noticed that patient developed frequent coughs and some shortness of breath.  Since Friday her condition worsened and eventually this night she could not sleep at all and had to come to the ED for evaluation. She denied PND, orthopnea, chest pain, diaphoresis, lower extremity swelling, abdominal pain, she complained of nausea and dry heaves, but no vomiting.   Interim history Admitted for acute respiratory failure with hypoxia.  Patient found to have a reduced EF, cardiology consulted and appreciated.  Assessment & Plan   Acute respiratory failure with hypoxia  -Suspect multifactorial including systolic CHF exacerbation, atrial fibrillation versus flutter, suspected pneumonia -Currently requiring BiPAP -Continue nebulizer treatments -currently NPO -DDimer 2.43 -Have ordered a VQ scan however this cannot be done as patient is currently on BiPAP.  Will order lower extremity Doppler -Patient was recently placed on Eliquis however may have had blood clot prior to starting this  medication  Acute on chronic systolic CHF exacerbation -Echocardiogram showed EF of 30-35% (echocardiogram December 2015 showed an EF of 50-55%) -BNP on admission 2340.8 -Placed on IV Lasix 40 mg twice daily -Monitor intake and output, daily weights  -cardiology consulted and appreciated  Sepsis secondary to Pneumonia -Chest x-ray on admission showed new bilateral pulmonary infiltrates- possible pneumonia. -upon admission, patient with leukocytosis, tachycardia and elevated lactic acid level -Continue Levaquin -Blood culture shows no growth to date -Urine strep pneumonia antigen negative -Repeat chest x-ray today showed stable right upper and left lower lobe infiltrates  Paroxysmal atrial fibrillation/atrial flutter -Patient was in normal sinus rhythm at previous cardiology visit per outpatient notes -CHADSVASC 6 -TSH 0.405 -Patient placed on Eliquis-transition to heparin as patient currently on BiPAP and is NPO -Amiodarone discontinued due to respiratory symptoms -Cardiology consulted and appreciated  Acute kidney injury on chronic kidney disease, stage III -Creatinine currently 1.80, baseline  approximately 1.3-1.4 -Given the patient has CHF exacerbation, will place on gentle IV Lasix and monitor BMP closely  Essential hypertension -Ramipril held -Continue metoprolol-will change to IV form 2.5 mg every 6 hours with holding parameters  Diabetes mellitus, type II -Home Januvia and repaglinide held -Continue insulin sliding scale CBG monitoring -Hemoglobin A1c 7.7   Hypomagnesemia  -Magnesium 1.8 after supplementation, will give additional dose for goal of 2 -Continue to monitor   Hypothyroidism -TSH 0.405 -Continue Synthroid-will change to IV form  Hypokalemia -Secondary to diuresis -Will replace with IV potassium and continue to monitor BMP  Hyperlipidemia -Continue statin  History of GI bleed -Secondary to ischemic colitis, patient was on Coumadin at that  time -Case was briefly discussed with cardiologist, Dr. Acie Fredrickson, who recommended anticoagulation  given her high CHADSVASC score -Closely monitor for signs and symptoms of bleeding -Continue to monitor CBC-hemoglobin appears to be stable  Sick sinus syndrome -Has dual-chamber pacemaker, and currently on telemetry  Nausea -Continue Zofran as needed  DVT Prophylaxis  Eliquis  Code Status: Full  Family Communication: Family at bedside  Disposition Plan: Admitted, pending further recommendations from cardiology and improvement in breathing  Consultants Cardiology  Procedures  Echocardiogram  Antibiotics   Anti-infectives (From admission, onward)   Start     Dose/Rate Route Frequency Ordered Stop   06/03/17 1400  levofloxacin (LEVAQUIN) IVPB 500 mg     500 mg 100 mL/hr over 60 Minutes Intravenous Every 24 hours 06/02/17 1329 06/08/17 1359   06/02/17 1230  levofloxacin (LEVAQUIN) IVPB 500 mg     500 mg 100 mL/hr over 60 Minutes Intravenous  Once 06/02/17 1158 06/02/17 1543      Subjective:   Becky Forbes seen and examined today.  Currently on BiPAP.  Feels her breathing has improved.  Per daughter at bedside, feels mother's breathing has improved and her work of breathing is also improved since being on BiPAP.  Does complain of occasional nausea.  Patient currently denies any chest pain, abdominal pain, vomiting, diarrhea or constipation dizziness or headache.  Objective:   Vitals:   06/05/17 0200 06/05/17 0342 06/05/17 0608 06/05/17 0800  BP:   119/87 (!) 118/58  Pulse:  84 95 76  Resp:  (!) 21 19 (!) 25  Temp:   98.4 F (36.9 C) 98 F (36.7 C)  TempSrc:   Axillary Axillary  SpO2: 92% 96% 98% 92%  Weight:   72.5 kg (159 lb 13.3 oz)   Height:        Intake/Output Summary (Last 24 hours) at 06/05/2017 1046 Last data filed at 06/05/2017 0013 Gross per 24 hour  Intake 100 ml  Output 1100 ml  Net -1000 ml   Filed Weights   06/03/17 0407 06/04/17 0446 06/05/17 0454   Weight: 73.3 kg (161 lb 9.6 oz) 72.2 kg (159 lb 3.2 oz) 72.5 kg (159 lb 13.3 oz)    Exam  General: Well developed, well nourished, NAD, appears stated age  HEENT: NCAT, mucous membranes moist.   Cardiovascular: S1 S2 auscultated, irregular  Respiratory: diminished breath sounds, no wheezing  Abdomen: Soft, nontender, nondistended, + bowel sounds  Extremities: warm dry without cyanosis clubbing. Trace LE edema  Neuro: AAOx3, Nonfocal  Psych: Appropriate mood and affect, pleasant    Data Reviewed: I have personally reviewed following labs and imaging studies  CBC: Recent Labs  Lab 06/02/17 1123 06/02/17 1443 06/03/17 0453 06/04/17 0700 06/05/17 0438  WBC 12.1* 10.9* 11.8* 15.7* 15.2*  NEUTROABS  --   --  10.1* 14.0*  --   HGB 9.4* 9.0* 10.1* 9.6* 9.4*  HCT 29.8* 28.1* 31.2* 29.7* 29.2*  MCV 93.4 93.0 92.3 92.5 91.3  PLT 291 281 246 319 098   Basic Metabolic Panel: Recent Labs  Lab 06/02/17 1123 06/02/17 1443 06/03/17 0453 06/04/17 0700 06/05/17 0438  NA 137  --  134* 133* 134*  K 4.4  --  3.7 3.7 3.3*  CL 102  --  100* 96* 97*  CO2 24  --  20* 22 24  GLUCOSE 213*  --  182* 154* 150*  BUN 20  --  22* 33* 36*  CREATININE 1.48* 1.36* 1.36* 1.95* 1.80*  CALCIUM 8.5*  --  8.4* 8.2* 8.1*  MG  --   --   --  1.5* 1.8  PHOS  --   --   --  3.2  --    GFR: Estimated Creatinine Clearance: 25.5 mL/min (A) (by C-G formula based on SCr of 1.8 mg/dL (H)). Liver Function Tests: Recent Labs  Lab 06/04/17 0700  AST 25  ALT 15  ALKPHOS 112  BILITOT 1.9*  PROT 7.4  ALBUMIN 2.4*   No results for input(s): LIPASE, AMYLASE in the last 168 hours. No results for input(s): AMMONIA in the last 168 hours. Coagulation Profile: No results for input(s): INR, PROTIME in the last 168 hours. Cardiac Enzymes: No results for input(s): CKTOTAL, CKMB, CKMBINDEX, TROPONINI in the last 168 hours. BNP (last 3 results) No results for input(s): PROBNP in the last 8760  hours. HbA1C: Recent Labs    06/02/17 1443 06/04/17 0700  HGBA1C 7.6* 7.7*   CBG: Recent Labs  Lab 06/04/17 0752 06/04/17 1143 06/04/17 1630 06/04/17 2154 06/05/17 0804  GLUCAP 167* 219* 148* 140* 167*   Lipid Profile: No results for input(s): CHOL, HDL, LDLCALC, TRIG, CHOLHDL, LDLDIRECT in the last 72 hours. Thyroid Function Tests: Recent Labs    06/02/17 1443  TSH 0.405   Anemia Panel: No results for input(s): VITAMINB12, FOLATE, FERRITIN, TIBC, IRON, RETICCTPCT in the last 72 hours. Urine analysis:    Component Value Date/Time   COLORURINE YELLOW 01/04/2015 1620   APPEARANCEUR CLEAR 01/04/2015 1620   LABSPEC 1.017 01/04/2015 1620   PHURINE 5.0 01/04/2015 1620   GLUCOSEU NEGATIVE 01/04/2015 1620   HGBUR TRACE (A) 01/04/2015 1620   BILIRUBINUR NEGATIVE 01/04/2015 1620   BILIRUBINUR negative 08/05/2012 1435   KETONESUR NEGATIVE 01/04/2015 1620   PROTEINUR NEGATIVE 01/04/2015 1620   UROBILINOGEN 0.2 01/04/2015 1620   NITRITE NEGATIVE 01/04/2015 1620   LEUKOCYTESUR TRACE (A) 01/04/2015 1620   Sepsis Labs: @LABRCNTIP (procalcitonin:4,lacticidven:4)  ) Recent Results (from the past 240 hour(s))  Culture, blood (routine x 2)     Status: None (Preliminary result)   Collection Time: 06/02/17 11:31 AM  Result Value Ref Range Status   Specimen Description BLOOD RIGHT ANTECUBITAL  Final   Special Requests   Final    BOTTLES DRAWN AEROBIC AND ANAEROBIC Blood Culture adequate volume   Culture   Final    NO GROWTH 2 DAYS Performed at [facility], [place] [address] [place], [place] [address]    Report Status PENDING  Incomplete  Respiratory Panel by PCR     Status: None   Collection Time: 06/03/17 10:49 AM  Result Value Ref Range Status   Adenovirus NOT DETECTED NOT DETECTED Final   Coronavirus 229E NOT DETECTED NOT DETECTED Final   Coronavirus HKU1 NOT DETECTED NOT DETECTED Final   Coronavirus NL63 NOT DETECTED NOT DETECTED Final   Coronavirus OC43 NOT  DETECTED NOT DETECTED Final   Metapneumovirus NOT DETECTED NOT DETECTED Final   Rhinovirus / Enterovirus NOT DETECTED NOT DETECTED Final   Influenza A NOT DETECTED NOT DETECTED Final   Influenza B NOT DETECTED NOT DETECTED Final   Parainfluenza Virus 1 NOT DETECTED NOT DETECTED Final   Parainfluenza Virus 2 NOT DETECTED NOT DETECTED Final   Parainfluenza Virus 3 NOT DETECTED NOT DETECTED Final   Parainfluenza Virus 4 NOT DETECTED NOT DETECTED Final   Respiratory Syncytial Virus NOT DETECTED NOT DETECTED Final   Bordetella pertussis NOT DETECTED NOT DETECTED Final   Chlamydophila pneumoniae NOT DETECTED NOT DETECTED Final   Mycoplasma pneumoniae NOT DETECTED NOT DETECTED Final    Comment: Performed at [facility]  Lab, [address], [place], [place] [address]  MRSA PCR Screening     Status: None   Collection Time: 06/05/17 12:01 AM  Result Value Ref Range Status   MRSA by PCR NEGATIVE NEGATIVE Final    Comment:        The GeneXpert MRSA Assay (FDA approved for NASAL specimens only), is one component of a comprehensive MRSA colonization surveillance program. It is not intended to diagnose MRSA infection nor to guide or monitor treatment for MRSA infections. Performed at [facility], [place] [address] [place],  [address]       Radiology Studies: Dg Chest 2 View  Result Date: 06/04/2017 CLINICAL DATA:  Cough, congestion, productive cough 4 days EXAM: CHEST  2 VIEW COMPARISON:  06/02/2017 FINDINGS: Persistent right upper lobe and left lower lobe airspace disease most concerning for multilobar pneumonia. No pleural effusion or pneumothorax. Stable cardiomegaly. Dual lead cardiac pacemaker. No acute osseous abnormality. IMPRESSION: Persistent right upper lobe and left lower lobe airspace disease most concerning for multilobar pneumonia. Followup PA and lateral chest X-ray is recommended in 3-4 weeks following trial of antibiotic therapy to ensure resolution and exclude  underlying malignancy. Electronically Signed   By: Kathreen Devoid   On: 06/04/2017 14:41   Dg Chest Port 1 View  Result Date: 06/05/2017 CLINICAL DATA:  Shortness of Breath EXAM: PORTABLE CHEST 1 VIEW COMPARISON:  06/04/2017 FINDINGS: Cardiac shadow is enlarged but stable. Pacing device is again seen. Persistent right upper lobe infiltrate is noted. Persistent changes in the left base are noted as well. No new focal infiltrate or effusion is seen. No bony abnormality is noted. IMPRESSION: Stable right upper and left lower lobe infiltrates. Electronically Signed   By: Inez Catalina M.D.   On: 06/05/2017 07:59     Scheduled Meds: . atorvastatin  20 mg Oral Daily  . bromocriptine  2.5 mg Oral Daily  . furosemide  40 mg Intravenous BID  . guaiFENesin  1,200 mg Oral BID  . insulin aspart  0-15 Units Subcutaneous TID WC  . insulin aspart  0-5 Units Subcutaneous QHS  . levothyroxine  44 mcg Intravenous Daily  . metoprolol tartrate  5 mg Intravenous Q6H   Continuous Infusions: . heparin    . levofloxacin (LEVAQUIN) IV Stopped (06/04/17 1405)  . potassium chloride 10 mEq (06/05/17 1037)  . potassium chloride       LOS: 3 days   Time Spent in minutes   45 minutes  Eriel Doyon D.O. on 06/05/2017 at 10:46 AM  Between 7am to 7pm - Pager - [phone]  After 7pm go to [email or web address] - password [email or web address]  And look for the night coverage person covering for me after hours  [facility] Office  [phone]

## 2017-06-05 NOTE — Progress Notes (Signed)
Patient placed on high flow nasal canula by RT.  Patient desat to 79% at lowest.  Patient was able to recover with slow deep breaths to 90%-91%.  She still complained of shortness of breath and heaviness in chest.  RT called to replace bipap.  Dr Ree Kida notified via page.

## 2017-06-05 NOTE — Progress Notes (Signed)
PRN neb treatment given.   Pt SATs are borderline stable. 91% on 60% FiO2. Pt has refractory hypoxemia with poor O2 reserve. Patient is requiring increase FiO2/EPAP levels in order to maintain borderline saturations. Will continue to monitor patient status.   Cuyler Vandyken L. Jennette Kettle, RRT, RCP

## 2017-06-05 NOTE — Progress Notes (Signed)
ANTICOAGULATION CONSULT NOTE - Initial Consult  Pharmacy Consult for Eliquis to Heparin  Indication: atrial fibrillation/ elevated D - dimer  Allergies  Allergen Reactions  . Morphine And Related Itching, Nausea And Vomiting, Rash and Other (See Comments)    01/04/15- patient says she is NOT allergic to morphine.  Morphine IV given this AM 01/04/15 in the ED. No adverse reaction.  Becky Forbes [Sitagliptin Phosphate] Other (See Comments)    Pt says this caused elevated liver enzymes and creatinine. IS TAKING 1/2 TABLET  . Actos [Pioglitazone Hydrochloride] Swelling    UNSPECIFIED REACTION   . Hydrocodone-Acetaminophen Itching, Nausea And Vomiting and Other (See Comments)    Per pt 05/12/15 she had this recently in the hospital with no issues at all  . Keflex [Cephalexin] Rash and Other (See Comments)    Burning sensation all over  . Percocet [Oxycodone-Acetaminophen] Nausea And Vomiting    Patient Measurements: Height: 5\' 4"  (162.6 cm) Weight: 159 lb 13.3 oz (72.5 kg) IBW/kg (Calculated) : 54.7   Vital Signs: Temp: 98 F (36.7 C) (02/21 0800) Temp Source: Axillary (02/21 0800) BP: 118/58 (02/21 0800) Pulse Rate: 76 (02/21 0800)  Labs: Recent Labs    06/03/17 0453 06/04/17 0700 06/05/17 0438  HGB 10.1* 9.6* 9.4*  HCT 31.2* 29.7* 29.2*  PLT 246 319 308  CREATININE 1.36* 1.95* 1.80*    Estimated Creatinine Clearance: 25.5 mL/min (A) (by C-G formula based on SCr of 1.8 mg/dL (H)).   Medical History: Past Medical History:  Diagnosis Date  . Anemia    hx  . Arthritis   . Carotid artery disease ([place])    a. Carotid duplex in 2010: 60-10% RICA, 93-23% LICA.  Becky Forbes Chronic combined systolic and diastolic CHF (congestive heart failure) ([place])   . CKD (chronic kidney disease), stage III ([place])    no nephrologist   . Complication of anesthesia   . Diabetes mellitus    x 10 yrs  . GI bleed    a. 04/2012 - lower GIB - felt 2/2 mild ischemic colitis as shown on colonoscopy, was  cleared for anticoag 1 week out from procedure.  Becky Forbes Heart murmur    hx  . HTN (hypertension)   . Hyperlipemia   . Hypothyroidism   . Ischemic colitis ([place])    a. 04/2012 - lower GIB - felt 2/2 mild ischemic colitis as shown on colonoscopy..  . PAF (paroxysmal atrial fibrillation) ([place])   . PONV (postoperative nausea and vomiting)   . 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 Becky Forbes  dual-chamber pacemaker 03/2014 by Forbes. Rayann Heman for sick sinus/sx bradycardia  . Renal insufficiency     Assessment: 77 year old female on Eliquis for Afib, last dose 2/20 at 10 pm, now to transition to heparin while on bipap and for rule out PE  Goal of Therapy:  aPTT 66-102 seconds seconds Monitor platelets by anticoagulation protocol: Yes  Heparin level = 0.3-0.7   Plan:  Heparin at 1000 units / hr 8 hour heparin level, PTT Daily heparin level, PTT, CBC  Thank you Becky Forbes, PharmD [phone]  06/05/2017,9:55 AM

## 2017-06-05 NOTE — Progress Notes (Addendum)
Progress Note  Patient Name: Becky Forbes Date of Encounter: 06/05/2017  Primary Cardiologist: Dr. Acie Fredrickson  Subjective   Pt feeling better today. On Bipap ventilation this AM. Good diuresis overnight with improvement in renal function. Elevated D-dimer>Hep gtt per pharm started in place of PO Eliquis   Inpatient Medications    Scheduled Meds: . apixaban  2.5 mg Oral BID  . atorvastatin  20 mg Oral Daily  . bromocriptine  2.5 mg Oral Daily  . furosemide  40 mg Intravenous BID  . guaiFENesin  1,200 mg Oral BID  . insulin aspart  0-15 Units Subcutaneous TID WC  . insulin aspart  0-5 Units Subcutaneous QHS  . levothyroxine  88 mcg Oral Daily  . metoprolol tartrate  25 mg Oral BID   Continuous Infusions: . levofloxacin (LEVAQUIN) IV Stopped (06/04/17 1405)  . potassium chloride 10 mEq (06/05/17 0848)   PRN Meds: acetaminophen, ipratropium, levalbuterol, ondansetron **OR** ondansetron (ZOFRAN) IV, polyethylene glycol   Vital Signs    Vitals:   06/05/17 0200 06/05/17 0342 06/05/17 0608 06/05/17 0800  BP:   119/87 (!) 118/58  Pulse:  84 95 76  Resp:  (!) 21 19 (!) 25  Temp:   98.4 F (36.9 C) 98 F (36.7 C)  TempSrc:   Axillary Axillary  SpO2: 92% 96% 98% 92%  Weight:   159 lb 13.3 oz (72.5 kg)   Height:        Intake/Output Summary (Last 24 hours) at 06/05/2017 0936 Last data filed at 06/05/2017 0013 Gross per 24 hour  Intake 100 ml  Output 1100 ml  Net -1000 ml   Filed Weights   06/03/17 0407 06/04/17 0446 06/05/17 0608  Weight: 161 lb 9.6 oz (73.3 kg) 159 lb 3.2 oz (72.2 kg) 159 lb 13.3 oz (72.5 kg)    Physical Exam   General: Frail, elderly, NAD Skin: Warm, dry, intact  Head: Normocephalic, atraumatic,  clear, moist mucus membranes. Neck: Negative for carotid bruits. No JVD Lungs: Bilateral mild wheezes and rhonchi. Breathing is unlabored. On Bipap Cardiovascular: Irregularly irregualr with S1 S2. No murmurs, rubs, or gallops Abdomen: Soft,  non-tender, non-distended with normoactive bowel sounds. No obvious abdominal masses. MSK: Strength and tone appear normal for age. 5/5 in all extremities Extremities: No edema. No clubbing or cyanosis. DP/PT pulses 2+ bilaterally Neuro: Alert and oriented. No focal deficits. No facial asymmetry. MAE spontaneously. Psych: Responds to questions appropriately with normal affect.    Labs    Chemistry Recent Labs  Lab 06/03/17 0453 06/04/17 0700 06/05/17 0438  NA 134* 133* 134*  K 3.7 3.7 3.3*  CL 100* 96* 97*  CO2 20* 22 24  GLUCOSE 182* 154* 150*  BUN 22* 33* 36*  CREATININE 1.36* 1.95* 1.80*  CALCIUM 8.4* 8.2* 8.1*  PROT  --  7.4  --   ALBUMIN  --  2.4*  --   AST  --  25  --   ALT  --  15  --   ALKPHOS  --  112  --   BILITOT  --  1.9*  --   GFRNONAA 36* 24* 26*  GFRAA 42* 27* 30*  ANIONGAP 14 15 13      Hematology Recent Labs  Lab 06/03/17 0453 06/04/17 0700 06/05/17 0438  WBC 11.8* 15.7* 15.2*  RBC 3.38* 3.21* 3.20*  HGB 10.1* 9.6* 9.4*  HCT 31.2* 29.7* 29.2*  MCV 92.3 92.5 91.3  MCH 29.9 29.9 29.4  MCHC 32.4 32.3 32.2  RDW 14.8 14.9 14.8  PLT 246 319 308    Cardiac EnzymesNo results for input(s): TROPONINI in the last 168 hours.  Recent Labs  Lab 06/02/17 1146  TROPIPOC 0.04     BNP Recent Labs  Lab 06/02/17 1123  BNP 2,340.8*     DDimer  Recent Labs  Lab 06/05/17 0730  DDIMER 2.43*     Radiology    Dg Chest 2 View  Result Date: 06/04/2017 CLINICAL DATA:  Cough, congestion, productive cough 4 days EXAM: CHEST  2 VIEW COMPARISON:  06/02/2017 FINDINGS: Persistent right upper lobe and left lower lobe airspace disease most concerning for multilobar pneumonia. No pleural effusion or pneumothorax. Stable cardiomegaly. Dual lead cardiac pacemaker. No acute osseous abnormality. IMPRESSION: Persistent right upper lobe and left lower lobe airspace disease most concerning for multilobar pneumonia. Followup PA and lateral chest X-ray is recommended in  3-4 weeks following trial of antibiotic therapy to ensure resolution and exclude underlying malignancy. Electronically Signed   By: Kathreen Devoid   On: 06/04/2017 14:41   Dg Chest Port 1 View  Result Date: 06/05/2017 CLINICAL DATA:  Shortness of Breath EXAM: PORTABLE CHEST 1 VIEW COMPARISON:  06/04/2017 FINDINGS: Cardiac shadow is enlarged but stable. Pacing device is again seen. Persistent right upper lobe infiltrate is noted. Persistent changes in the left base are noted as well. No new focal infiltrate or effusion is seen. No bony abnormality is noted. IMPRESSION: Stable right upper and left lower lobe infiltrates. Electronically Signed   By: Inez Catalina M.D.   On: 06/05/2017 07:59    Telemetry    06/05/17 Atrial fibrillation HR 76 - Personally Reviewed  ECG    The EKG was personally reviewed and demonstrates:  06/02/17 Atrial flutter w/ HR 87 and T-wave inversions in leads V4-V6, comparable to previous tracings  - Personally Reviewed  Cardiac Studies   ECHO: 06/03/17 Study Conclusions  - Left ventricle: The cavity size was mildly dilated. Systolic function was moderately to severely reduced. The estimated ejection fraction was in the range of 30% to 35%. Diffuse hypokinesis. - Aortic valve: Noncoronary cusp mobility was restricted. Transvalvular velocity was within the normal range. There was no stenosis. There was no regurgitation. - Mitral valve: Mildly calcified annulus. Transvalvular velocity was within the normal range. There was no evidence for stenosis. There was mild to moderate regurgitation. - Left atrium: The atrium was severely dilated. - Right ventricle: The cavity size was normal. Wall thickness was normal. Systolic function was normal. - Right atrium: The atrium was severely dilated. - Atrial septum: No defect or patent foramen ovale was identified by color flow Doppler. - Tricuspid valve: There was mild regurgitation. - Pulmonary arteries:  Systolic pressure was within the normal range. PA peak pressure: 27 mm Hg (S).  Impressions:  - Compared with the echo 69/62/95, systolic function is reduced.  Echo 03/28/14:  Study Conclusions  - Left ventricle: Posterior basal hypokinesis. The cavity size was mildly dilated. Wall thickness was normal. Systolic function was normal. The estimated ejection fraction was in the range of 50% to 55%. Doppler parameters are consistent with elevated ventricular end-diastolic filling pressure. - Mitral valve: Calcified annulus. Mildly thickened leaflets . There was mild regurgitation. - Left atrium: The atrium was severely dilated. - Right atrium: The atrium was moderately dilated. - Atrial septum: No defect or patent foramen ovale was identified. - Impressions: PA pressure elevated base on TR velocity.  Impressions:  - PA pressure elevated base on TR velocity  Patient Profile     77 y.o. female with a hx of PAF and aflutter on amiodarone (not on anticoagulation secondary to GI bleed), NICM (EF 50-55% by echo 03/2014, now 30-35% per echo 06/03/17), sick sinus syndrome s/p pacemaker (2015), DMII, HTN, HLD, h/o GI bleed on coumadin, CKD stage III (baseline -1.3), and carotid disease by duplex 2010 who is being seen today for the evaluation of atrial fibrillation at the request of Dr. Ree Kida.   Assessment & Plan    1. Atrial fibrillation/atrial flutter: -Remains in atrial fibrillation, HR controlled HR 76 -Amiodarone d/c'ed yesterday with the continuation of Metoprolol 25mg  PO BID. Will change to IV  (Metoprolol IV 5mg  Q6H) form given the need for Bipap -Eliquis discontinued and changed to Hep gtt given elevated D-dimer  -CBC, Hb 9.4 today>> 9.4>9.0>10.1>9.6>9.4 today  -TSH, 0.405> on synthroid  2. Chronic systolic heart failure: Joya Gaskins, 159lb. No change from yesterday  -I&O, net negative 1L. Total output 1.1L -Continue Lasix 40 mg IV BID -Given drop in EF to 30-35%  per echo 06/03/17 (down from 50-55% in 2015), will need an ischemic evaluation at some point, however this will need to be completed once she is over this acute respiratory illness  3. CKD Stage III: -Cr, 1.80 today. Improved with diuresis; 1.48>1.36>1.36>1.95>1.80 -Baseline appears to be 1.2-1.6 -HF exacerbation>>Lasix 40mg  IV BID added  -Monitor response closely  -Avoid nephrotoxic medications  4. Hypoxic respiratory failure: -Per primary -Continue abx  5. DMII: -Home januvia -Per IM, SSI  4. HTN: -Stable, 118/58>119/87>114/68 -Metoprolol 5mg  IV Q6H started given the need for Bipap ventilation  -Well controlled at last office visit -ACE held on admission given elevated renal function  5. HLD: -Stable, last lipid panel, CHO-124, HDL-36, LDL-70, Trig-90 -On Lipitor  6. Carotid artery disease: -Risk factor modification; statin, BB   Signed, Kathyrn Drown NP-C [facility] Pager: [phone] 06/05/2017, 9:36 AM     For questions or updates, please contact   Please consult [email or web address] for contact info under Cardiology/STEMI.  Attending Note:   The patient was seen and examined.  Agree with assessment and plan as noted above.  Changes made to the above note as needed.  Patient seen and independently examined with Kathyrn Drown, NP .   We discussed all aspects of the encounter. I agree with the assessment and plan as stated above.  1.  Respiratory failure: The patient presented to the hospital several days ago with increasing respiratory failure.  She has multiple possible etiologies including newly diagnosed acute on chronic systolic congestive heart failure.  She was found to have an elevated d-dimer today.  She has renal insufficiency so CT angiogram would not be the best test for her.  She is on a BiPAP mask and cannot go down for a VQ scan.  I would agree with stopping her Eliquis and putting on her on a heparin drip for now.  Hopefully she can get a VQ scan when her  pulmonary status improves.   I have spent a total of 40 minutes with patient reviewing hospital  notes , telemetry, EKGs, labs and examining patient as well as establishing an assessment and plan that was discussed with the patient. > 50% of time was spent in direct patient care.  2.  Acute on chronic systolic congestive heart failure: Patient's ejection fraction is lower than it had been previously.  She is diuresing fairly well.  Unfortunately, she is still hypoxic.  Continue current medications.  I would not want to try  to diurese her more aggressively because of her chronic renal insufficiency.   Thayer Headings, Brooke Bonito., MD, [facility] 06/05/2017, 4:01 PM [address],  [place] Pager [phone]

## 2017-06-05 NOTE — Progress Notes (Signed)
Pt has significantly poor O2 reserve at this time. Pt cant not maintain her SATs to acceptable ranges settings were adjusted on NIV due to poor oxygenation. EPAP was increase from 6cmh20 to [place]. Pt is on 60% at this time. SATs are borderline. Currently patient appears comfortable with no signs of distress. Review CXR 06/04/2017 2:22pm  noted Persistent right upper lobe and left lower lobe airspace disease most concerning for multilobar pneumonia. Plan is to try to maintain good ventilation and oxygenation with NIV throughout the night. Family is at the bedside. RRT will continue to monitor patient status. RN aware.

## 2017-06-05 NOTE — Progress Notes (Signed)
Pt started to dry heave, medicated with Zofran 4 mg IVP, no emesis at this time, will continue to monitor. Respiratory therapist has been most of the shift adjusting her O2 settings on her Bipap settings D/T decreasing O2 sats. Pt has course crackles more on left than right and so far a strong dry cough, adjusted her position in bed, will continue to monitor.

## 2017-06-06 ENCOUNTER — Inpatient Hospital Stay (HOSPITAL_COMMUNITY): Payer: Medicare Other

## 2017-06-06 DIAGNOSIS — I5042 Chronic combined systolic (congestive) and diastolic (congestive) heart failure: Secondary | ICD-10-CM

## 2017-06-06 DIAGNOSIS — I5023 Acute on chronic systolic (congestive) heart failure: Secondary | ICD-10-CM

## 2017-06-06 DIAGNOSIS — R0602 Shortness of breath: Secondary | ICD-10-CM

## 2017-06-06 DIAGNOSIS — I481 Persistent atrial fibrillation: Secondary | ICD-10-CM

## 2017-06-06 LAB — CBC
HCT: 31.4 % — ABNORMAL LOW (ref 36.0–46.0)
Hemoglobin: 9.8 g/dL — ABNORMAL LOW (ref 12.0–15.0)
MCH: 29.2 pg (ref 26.0–34.0)
MCHC: 31.2 g/dL (ref 30.0–36.0)
MCV: 93.5 fL (ref 78.0–100.0)
Platelets: 336 10*3/uL (ref 150–400)
RBC: 3.36 MIL/uL — ABNORMAL LOW (ref 3.87–5.11)
RDW: 15.3 % (ref 11.5–15.5)
WBC: 12.3 10*3/uL — ABNORMAL HIGH (ref 4.0–10.5)

## 2017-06-06 LAB — BASIC METABOLIC PANEL
Anion gap: 14 (ref 5–15)
BUN: 41 mg/dL — AB (ref 6–20)
CALCIUM: 8.7 mg/dL — AB (ref 8.9–10.3)
CO2: 27 mmol/L (ref 22–32)
Chloride: 99 mmol/L — ABNORMAL LOW (ref 101–111)
Creatinine, Ser: 1.6 mg/dL — ABNORMAL HIGH (ref 0.44–1.00)
GFR calc Af Amer: 35 mL/min — ABNORMAL LOW (ref >60)
GFR, EST NON AFRICAN AMERICAN: 30 mL/min — AB (ref >60)
GLUCOSE: 156 mg/dL — AB (ref 65–99)
Potassium: 3.8 mmol/L (ref 3.5–5.1)
Sodium: 140 mmol/L (ref 135–145)

## 2017-06-06 LAB — APTT: aPTT: 78 seconds — ABNORMAL HIGH (ref 24–36)

## 2017-06-06 LAB — C-REACTIVE PROTEIN: CRP: 34.2 mg/dL — ABNORMAL HIGH (ref <1.0)

## 2017-06-06 LAB — MAGNESIUM: MAGNESIUM: 2.1 mg/dL (ref 1.7–2.4)

## 2017-06-06 LAB — SEDIMENTATION RATE: Sed Rate: 125 mm/hr — ABNORMAL HIGH (ref 0–22)

## 2017-06-06 LAB — GLUCOSE, CAPILLARY
Glucose-Capillary: 166 mg/dL — ABNORMAL HIGH (ref 65–99)
Glucose-Capillary: 104 mg/dL — ABNORMAL HIGH (ref 65–99)
Glucose-Capillary: 125 mg/dL — ABNORMAL HIGH (ref 65–99)
Glucose-Capillary: 230 mg/dL — ABNORMAL HIGH (ref 65–99)

## 2017-06-06 LAB — HEPARIN LEVEL (UNFRACTIONATED): Heparin Unfractionated: >2.2 IU/mL — ABNORMAL HIGH (ref 0.30–0.70)

## 2017-06-06 MED ORDER — INSULIN GLARGINE 100 UNIT/ML ~~LOC~~ SOLN
5.0000 [IU] | Freq: Every day | SUBCUTANEOUS | Status: DC
Start: 2017-06-06 — End: 2017-06-06
  Administered 2017-06-06: 5 [IU] via SUBCUTANEOUS
  Filled 2017-06-06: qty 0.05

## 2017-06-06 MED ORDER — METHYLPREDNISOLONE SODIUM SUCC 125 MG IJ SOLR
125.0000 mg | Freq: Four times a day (QID) | INTRAMUSCULAR | Status: DC
  Administered 2017-06-06 – 2017-06-12 (×23): 125 mg via INTRAVENOUS
  Filled 2017-06-06 (×23): qty 2

## 2017-06-06 MED ORDER — INSULIN GLARGINE 100 UNIT/ML ~~LOC~~ SOLN
10.0000 [IU] | Freq: Every day | SUBCUTANEOUS | Status: DC
  Administered 2017-06-07: 10 [IU] via SUBCUTANEOUS
  Filled 2017-06-06: qty 0.1

## 2017-06-06 MED ORDER — PROMETHAZINE HCL 25 MG/ML IJ SOLN
12.5000 mg | Freq: Once | INTRAMUSCULAR | Status: AC
  Administered 2017-06-06: 12.5 mg via INTRAVENOUS
  Filled 2017-06-06: qty 1

## 2017-06-06 MED ORDER — PREDNISONE 20 MG PO TABS
60.0000 mg | ORAL_TABLET | Freq: Every day | ORAL | Status: DC
  Administered 2017-06-06: 60 mg via ORAL
  Filled 2017-06-06: qty 3

## 2017-06-06 NOTE — Progress Notes (Signed)
Patient complaining of nausea, had Zofran 4mg  at 2136 ordered q6h as needed.  RN text paged [facility] with this information.

## 2017-06-06 NOTE — Progress Notes (Signed)
Patient transported on BiPAP to CT and back to room. Vitals remained stable.

## 2017-06-06 NOTE — Consult Note (Addendum)
Name: Becky Forbes MRN: [id number] DOB: [birth year]    ADMISSION DATE:  06/02/2017 CONSULTATION DATE:  06/06/17  REFERRING MD :  Dr. 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    The patient reports she has had decreased activity tolerance since January 2019 and shortness of breath.  She reports she thinks she has had some weight loss.  She denies swelling in her lower extremities or pain.  She reports chest pressure at times.  She denies fevers, chills, nausea, vomiting, choking / difficulty swallowing, n/v/d, viral illness / sick contacts.  She went to [place] to shop with her sister the weekend before admit and after "just crashed".    She has been married for 30 years.  Her husband used to smoke but quit 40+ years ago.  She has had no known occupational exposures, wood heat, mold, birds / Curator.  The patient reports she has been on Amiodarone for approximately 3 years but was recently decreased to 3 times weekly (followed by Dr. Cathie Olden).      Since admit, she has been on BiPAP / increased O2 since admit.  Work up for PE is pending.  Despite high O2 needs her work of breathing has been normal.  Cardiology was consulted and she was diuresed 3.8L since admit.  ECHO revealed new reduction of LVEF to 30-35%.  She remains in AF with rate of 100-120's.  CT of the chest without contrast assessed which demonstrated diffuse bilateral ground glass.     PCCM consulted for pulmonary evaluation.   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   has a past surgical history that includes Cataract extraction (05/25/2007); Appendectomy; Carpal tunnel release (Right); Abdominal hysterectomy; Lumbar laminectomy; Foot surgery (Left, 2006); Osteotomy (Left, 2006); Ganglion cyst excision (Right, 12/2007); Total knee arthroplasty (Bilateral, 05/02/08, 07/25/08); Joint replacement; Pars plana vitrectomy w/ repair of macular hole (Right, 2010); Colonoscopy (01/31/2009); Colonoscopy (04/30/2012); permanent pacemaker insertion (N/A, 03/29/2014); and Back surgery.  Prior to Admission medications   Medication Sig Start Date End Date Taking? Authorizing Provider  acetaminophen (TYLENOL) 325 MG tablet Take 650 mg by mouth every 6 (six) hours as needed for moderate pain.   Yes [provider]  amiodarone (PACERONE) 200 MG tablet Take 200 mg by mouth 3 (three) times a week.   Yes [provider]  atorvastatin (LIPITOR) 20 MG tablet TAKE 1 TABLET BY MOUTH  DAILY Patient taking differently: TAKE 20 mg TABLET BY MOUTH  DAILY 12/05/16  Yes Nahser, Wonda Cheng, MD  bromocriptine (PARLODEL) 2.5 MG tablet TAKE 1 TABLET BY MOUTH  DAILY Patient taking differently: TAKE 2.5 mg  TABLET BY MOUTH  DAILY 04/25/16  Yes Renato Shin, MD  cholecalciferol (VITAMIN D) 400 units TABS tablet Take 800 Units by mouth daily.   Yes [provider]  furosemide (LASIX) 20 MG tablet TAKE 1 TABLET BY MOUTH  DAILY AS NEEDED Patient taking differently: TAKE 20 mg TABLET BY MOUTH  DAILY AS NEEDED  12/05/16  Yes Nahser, Wonda Cheng, MD  JANUVIA 100 MG tablet TAKE ONE-HALF TABLET BY  MOUTH DAILY Patient taking differently: TAKE 50 mg  TABLET BY  MOUTH DAILY 02/21/17  Yes Renato Shin, MD  levothyroxine (SYNTHROID, LEVOTHROID) 88 MCG tablet Take 1 tablet (88 mcg total) daily by mouth. 02/25/17  Yes Burns, Claudina Lick, MD    metoprolol tartrate (LOPRESSOR) 25 MG tablet Take 1 tablet (25 mg total) by mouth 2 (two) times daily. 02/03/17 06/02/17 Yes Nahser, Wonda Cheng, MD  ramipril (ALTACE) 5 MG capsule TAKE 1 CAPSULE BY MOUTH TWO TIMES DAILY Patient taking differently: TAKE 5 mg  CAPSULE BY MOUTH TWO TIMES DAILY 08/08/16  Yes Burns, Claudina Lick, MD  repaglinide (PRANDIN) 2 MG tablet 1 1/2 tabs with breakfast, 1 1/2 with lunch, and 1/2 with supper Patient taking differently: Take 1-3 mg by mouth See admin instructions. 3 mg  tabs with breakfast, 3 mg  with lunch, and 1 mg with supper 05/09/17  Yes Renato Shin, MD    Allergies  Allergen Reactions  . Morphine And Related Itching, Nausea And Vomiting, Rash and Other (See Comments)    01/04/15- patient says she is NOT allergic to morphine.  Morphine IV given this AM 01/04/15 in the ED. No adverse reaction.  Celesta Gentile [Sitagliptin Phosphate] Other (See Comments)    Pt says this caused elevated liver enzymes and creatinine. IS TAKING 1/2 TABLET  . Actos [Pioglitazone Hydrochloride] Swelling    UNSPECIFIED REACTION   . Hydrocodone-Acetaminophen Itching, Nausea And Vomiting and Other (See Comments)    Per pt 05/12/15 she had this recently in the hospital with no issues at all  . Keflex [Cephalexin] Rash and Other (See Comments)    Burning sensation all over  . Percocet [Oxycodone-Acetaminophen] Nausea And Vomiting    FAMILY HISTORY:  family history includes Coronary artery disease in her father; Diabetes in her brother, father, and sister; Hypertension in her father and mother; Kidney failure in her mother and sister; Other in her brother; Stroke in her father.  SOCIAL HISTORY:  reports that  has never smoked. she has never used smokeless tobacco. She reports that she does not drink alcohol or use drugs.  REVIEW OF SYSTEMS:  POSITIVES IN BOLD Constitutional: Negative for fever, chills, weight loss, malaise/fatigue and diaphoresis.  HENT: Negative for hearing loss, ear pain,  nosebleeds, congestion, sore throat, neck pain, tinnitus and ear discharge.   Eyes: Negative for blurred vision, double vision, photophobia, pain, discharge and redness.  Respiratory: Negative for cough, hemoptysis, white sputum production, shortness of breath, wheezing and stridor.   Cardiovascular: Negative for chest pain, palpitations, orthopnea, claudication, leg swelling and PND.  Gastrointestinal: Negative for heartburn, nausea, vomiting, abdominal pain, diarrhea, constipation, blood in stool and melena.  Genitourinary: Negative for dysuria, urgency, frequency, hematuria and flank pain.  Musculoskeletal: Negative for myalgias, back pain, joint pain and falls.  Skin: Negative for itching and rash.  Neurological: Negative for dizziness, tingling, tremors, sensory change, speech change, focal weakness, seizures, loss of consciousness, weakness and headaches.  Endo/Heme/Allergies: Negative for environmental allergies and polydipsia. Does not bruise/bleed easily.   SUBJECTIVE:   VITAL SIGNS: Temp:  [98.1 F (36.7 C)-99.2 F (37.3 C)] 98.2 F (36.8 C) (02/22 1132) Pulse Rate:  [76-113] 82 (02/22 1215) Resp:  [14-24] 23 (02/22 1215) BP: (112-132)/(59-78) 127/65 (02/22 1132) SpO2:  [86 %-100 %] 99 % (02/22 1215) FiO2 (%):  [50 %-100 %] 80 % (02/22 1215) Weight:  [157 lb 10.1 oz (  71.5 kg)] 157 lb 10.1 oz (71.5 kg) (02/22 0402)  PHYSICAL EXAMINATION: General: elderly female in NAD sitting up in bed HEENT: MM pink/moist, no jvd PSY: calm / appropriate  Neuro: AAOx4, speech clear, MAE  CV: s1s2 rrr, no m/r/g PULM: even/non-labored, lungs bilaterally with posterior crackles 2/3 way up  RF:XJOI, non-tender, bsx4 active  Extremities: warm/dry, no edema  Skin: no rashes or lesions   Recent Labs  Lab 06/04/17 0700 06/05/17 0438 06/06/17 0520  NA 133* 134* 140  K 3.7 3.3* 3.8  CL 96* 97* 99*  CO2 22 24 27   BUN 33* 36* 41*  CREATININE 1.95* 1.80* 1.60*  GLUCOSE 154* 150* 156*     Recent Labs  Lab 06/04/17 0700 06/05/17 0438 06/06/17 0520  HGB 9.6* 9.4* 9.8*  HCT 29.7* 29.2* 31.4*  WBC 15.7* 15.2* 12.3*  PLT 319 308 336    Ct Chest Wo Contrast  Result Date: 06/06/2017 CLINICAL DATA:  Respiratory failure.  Inpatient. EXAM: CT CHEST WITHOUT CONTRAST TECHNIQUE: Multidetector CT imaging of the chest was performed following the standard protocol without IV contrast. COMPARISON:  Chest radiograph from one day prior. FINDINGS: Cardiovascular: Mild cardiomegaly, increased from prior CT abdomen study of 01/04/2015. No significant pericardial fluid/thickening. Three-vessel coronary atherosclerosis. Two lead left subclavian pacemaker with lead tips in the right atrium and right ventricular apex. Atherosclerotic nonaneurysmal thoracic aorta. Dilated main pulmonary artery (3.5 cm diameter). Mediastinum/Nodes: No discrete thyroid nodules. Unremarkable esophagus. No axillary adenopathy. Right paratracheal adenopathy measuring up to 1.8 cm (series 3/image 46). Mild bilateral hilar adenopathy, poorly delineated on the noncontrast images. Lungs/Pleura: No pneumothorax. No pleural effusion. There is diffuse thickening of the peribronchovascular interstitium in both lungs with associated mild parenchymal distortion and mild traction bronchiolectasis. There is extensive patchy ground-glass attenuation throughout both lungs with associated mild interlobular septal thickening. No discrete lung masses or significant pulmonary nodules. Patchy consolidation is present in dependent right upper lobe, lingula and dependent right lower lobe. No frank honeycombing. Upper abdomen: Small hiatal hernia. Musculoskeletal: No aggressive appearing focal osseous lesions. Moderate thoracic spondylosis. Partially visualized bilateral lumbar spinal fusion hardware. IMPRESSION: 1. Patchy consolidation in the dependent right upper lobe, lingula and dependent right lower lobe. Extensive thickening of the  peribronchovascular interstitium and extensive ground-glass attenuation throughout both lungs with associated mild traction bronchiolectasis and architectural distortion. Findings are suspected to represent ARDS complicating a multilobar pneumonia or acute interstitial pneumonia (AIP). The lungs were clear on the comparison chest radiographs dated 03/30/2014. A component of cardiogenic pulmonary edema cannot be excluded given the cardiomegaly. 2. Nonspecific mediastinal and bilateral hilar adenopathy, likely reactive. Recommend attention on follow-up chest CT with IV contrast in 3-6 months. 3. Dilated main pulmonary artery, suggesting pulmonary arterial hypertension. 4. Three-vessel coronary atherosclerosis. 5. Small hiatal hernia. Aortic Atherosclerosis (ICD10-I70.0). Electronically Signed   By: Ilona Sorrel M.D.   On: 06/06/2017 11:26   Dg Chest Port 1 View  Result Date: 06/05/2017 CLINICAL DATA:  Shortness of Breath EXAM: PORTABLE CHEST 1 VIEW COMPARISON:  06/04/2017 FINDINGS: Cardiac shadow is enlarged but stable. Pacing device is again seen. Persistent right upper lobe infiltrate is noted. Persistent changes in the left base are noted as well. No new focal infiltrate or effusion is seen. No bony abnormality is noted. IMPRESSION: Stable right upper and left lower lobe infiltrates. Electronically Signed   By: Inez Catalina M.D.   On: 06/05/2017 07:59      SIGNIFICANT EVENTS  2/19  Admit  2/22  PCCM consulted  STUDIES 2/22 CT Chest w/o >> Patchy consolidation in the dependent right upper lobe, lingula and dependent right lower lobe. Extensive thickening of the peribronchovascular interstitium and extensive ground-glass attenuation throughout both lungs with associated mild traction bronchiolectasis and architectural distortion. Nonspecific mediastinal and bilateral hilar adenopathy, likely reactive.  Dilated main pulmonary artery, suggesting pulmonary arterial Hypertension. Three-vessel coronary  atherosclerosis.  Small hiatal hernia.  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  She has a hx of chronic combined CHF and has been diuresed 3.8L and is clinically euvolemic.  She does not have any known history of autoimmune disease. In 2016 she had a CT of the Abd which showed her lower lungs that appeared normal.  She has been on amiodarone for AF the last 3 years.  Symptoms began with slow SOB in January 2019 and mostly non-productive cough.  DDx includes amiodarone lung toxicity (she is also on bromocriptine which can be associated with pulmonary fibrosis), autoimmune pulmonary manifestation, cardiogenic pulmonary edema & hypersensitivity pneumonitis.   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  Acute Hypoxic Respiratory Failure - doubt PE given CT appearance and symptom onset   Plan: Assess autoimmune panel as above  Prednisone 60 mg QD  Agree with discontinuing amiodarone, would not restart  HFNC O2 for sats > 90% Discontinue V/Q  Await LE doppler  Heparin gtt per pharmacy / Cardiology  Continue empiric abx for now  Will need tight glucose control with steroids.  Add lantus 5 units QD while on prednisone.     PCCM will continue to follow.   Noe Gens, NP-C [facility] Pgr: [phone] or if no answer [phone] 06/06/2017, 2:50 PM

## 2017-06-06 NOTE — Progress Notes (Signed)
Preliminary results by tech - Venous Duplex Lower Ext. Completed. Negative for deep and superficial vein thrombosis in both legs.  Dalicia Kisner, BS, RDMS, RVT  

## 2017-06-06 NOTE — Progress Notes (Signed)
Dr. Ree Kida updated with CT results. RT updated. RRT also aware of results . MD consulting PCCM . Will continue to monitor closely.

## 2017-06-06 NOTE — Progress Notes (Signed)
PROGRESS NOTE    AURIELLE SLINGERLAND  OAC:[id number] DOB: 20-Dec-1940 DOA: 06/02/2017 PCP: Binnie Rail, MD   Chief Complaint  Patient presents with  . Shortness of Breath  . Atrial Fibrillation  . Chest Pain    Brief Narrative:  HPI on 06/02/2017 by Ms. [address] SHANISE Forbes is a 77 y.o. female with medical history significant of sick sinus syndrome with implanted pacemaker, paroxysmal atrial fibrillation, hypothyroidism, combined diastolic and systolic congestive heart failure, chronic kidney disease, hypothyroidism, diabetes mellitus who presented to the emergency department with complaints of worsening shortness of breath, cough productive of white sputum and chest tightness.  Patient reported that since December she was battling walled with cough and shortness of breath off and on and on Friday he was traveling to [place] to see his sister, had a lot of walking around and on the way back her daughter noticed that patient developed frequent coughs and some shortness of breath.  Since Friday her condition worsened and eventually this night she could not sleep at all and had to come to the ED for evaluation. She denied PND, orthopnea, chest pain, diaphoresis, lower extremity swelling, abdominal pain, she complained of nausea and dry heaves, but no vomiting.   Interim history Admitted for acute respiratory failure with hypoxia.  Patient found to have a reduced EF, cardiology consulted and appreciated. Continues to be hypoxic and unable to wean off of bipap. Obtained CT chest w/o contrast which showed ARDS. PCCM consulted.  Assessment & Plan   Acute respiratory failure with hypoxia  -Suspect multifactorial including systolic CHF exacerbation, atrial fibrillation versus flutter, suspected pneumonia -Currently requiring BiPAP and unable to wean  -Continue nebulizer treatments -currently NPO -DDimer 2.43 -Have ordered a VQ scan however this cannot be done as patient is currently on  BiPAP. Lower extremity doppler pending. -Patient was recently placed on Eliquis however may have had blood clot prior to starting this medication -Ordered CT chest w/o contrast: suspected ARDS complicating a multilobar pneumonia or acute interstitial pneumonia.  -PCCM consulted and appreciated  Acute on chronic systolic CHF exacerbation -Echocardiogram showed EF of 30-35% (echocardiogram December 2015 showed an EF of 50-55%) -BNP on admission 2340.8 -Placed on IV Lasix 40 mg twice daily -Monitor intake and output, daily weights  -cardiology consulted and appreciated -urine output over past 24 hours 2000cc  Sepsis secondary to Pneumonia -Chest x-ray on admission showed new bilateral pulmonary infiltrates- possible pneumonia. -upon admission, patient with leukocytosis, tachycardia and elevated lactic acid level -Continue Levaquin -Blood culture shows no growth to date -Urine strep pneumonia antigen negative -Repeat chest x-ray today showed stable right upper and left lower lobe infiltrates -CT scan noted above  Paroxysmal atrial fibrillation/atrial flutter -Patient was in normal sinus rhythm at previous cardiology visit per outpatient notes -CHADSVASC 6 -TSH 0.405 -Patient placed on Eliquis-transition to heparin as patient currently on BiPAP and is NPO -Amiodarone discontinued due to respiratory symptoms -Cardiology consulted and appreciated  Acute kidney injury on chronic kidney disease, stage III -Creatinine currently 1.60, baseline  approximately 1.3-1.4  Essential hypertension -Ramipril held -Continue metoprolol-will change to IV form 2.5 mg every 6 hours with holding parameters  Diabetes mellitus, type II -Home Januvia and repaglinide held -Continue insulin sliding scale CBG monitoring -Hemoglobin A1c 7.7   Hypomagnesemia  -replaced, magnesium currently 2.1 -Continue to monitor   Hypothyroidism -TSH 0.405 -Continue Synthroid-will change to IV  form  Hypokalemia -Secondary to diuresis -replaced, continue to monitor BMP  Hyperlipidemia -Continue statin  History of GI bleed -Secondary to ischemic colitis, patient was on Coumadin at that time -Case was briefly discussed with cardiologist, Dr. Acie Fredrickson, who recommended anticoagulation given her high CHADSVASC score -Closely monitor for signs and symptoms of bleeding -Continue to monitor CBC-hemoglobin appears to be stable, currently 9.8  Sick sinus syndrome -Has dual-chamber pacemaker, and currently on telemetry  Nausea -Continue Zofran as needed  DVT Prophylaxis  Heparin   Code Status: Full  Family Communication: Family at bedside  Disposition Plan: Admitted, PCCM consulted and pending  Consultants Cardiology  Procedures  Echocardiogram  Antibiotics   Anti-infectives (From admission, onward)   Start     Dose/Rate Route Frequency Ordered Stop   06/03/17 1400  levofloxacin (LEVAQUIN) IVPB 500 mg     500 mg 100 mL/hr over 60 Minutes Intravenous Every 24 hours 06/02/17 1329 06/08/17 1359   06/02/17 1230  levofloxacin (LEVAQUIN) IVPB 500 mg     500 mg 100 mL/hr over 60 Minutes Intravenous  Once 06/02/17 1158 06/02/17 1543      Subjective:   Shavell Nored seen and examined today.  Currently on BiPAP.  Patient feels her breathing has improved.  Once taken off BiPAP, SPO2 did drop.  After coaching, and being on 10 L high flow, O2 saturations were in the 90s.  Currently denies chest pain, abdominal pain, nausea or vomiting, diarrhea or constipation, dizziness or headache.  Objective:   Vitals:   06/06/17 0814 06/06/17 0920 06/06/17 1100 06/06/17 1132  BP:  132/78  127/65  Pulse: (!) 101 99 (!) 110 98  Resp: 18 19 (!) 23 (!) 22  Temp:    98.2 F (36.8 C)  TempSrc:    Oral  SpO2: (!) 87% 92% 98% 91%  Weight:      Height:        Intake/Output Summary (Last 24 hours) at 06/06/2017 1142 Last data filed at 06/06/2017 1100 Gross per 24 hour  Intake 314.89 ml   Output 3100 ml  Net -2785.11 ml   Filed Weights   06/04/17 0446 06/05/17 0608 06/06/17 0402  Weight: 72.2 kg (159 lb 3.2 oz) 72.5 kg (159 lb 13.3 oz) 71.5 kg (157 lb 10.1 oz)   Exam  General: Well developed, well nourished, NAD, appears stated age  HEENT: NCAT, mucous membranes moist. BiPAP in place  Cardiovascular: S1 S2 auscultated, irregular.  Respiratory:end inspiratory wheeze noted, otherwise clear  Abdomen: Soft, nontender, nondistended, + bowel sounds  Extremities: warm dry without cyanosis clubbing or edema  Neuro: AAOx3, nonfocal  Psych: Appropriate mood and affect, pleasant   Data Reviewed: I have personally reviewed following labs and imaging studies  CBC: Recent Labs  Lab 06/02/17 1443 06/03/17 0453 06/04/17 0700 06/05/17 0438 06/06/17 0520  WBC 10.9* 11.8* 15.7* 15.2* 12.3*  NEUTROABS  --  10.1* 14.0*  --   --   HGB 9.0* 10.1* 9.6* 9.4* 9.8*  HCT 28.1* 31.2* 29.7* 29.2* 31.4*  MCV 93.0 92.3 92.5 91.3 93.5  PLT 281 246 319 308 161   Basic Metabolic Panel: Recent Labs  Lab 06/02/17 1123 06/02/17 1443 06/03/17 0453 06/04/17 0700 06/05/17 0438 06/06/17 0520  NA 137  --  134* 133* 134* 140  K 4.4  --  3.7 3.7 3.3* 3.8  CL 102  --  100* 96* 97* 99*  CO2 24  --  20* 22 24 27   GLUCOSE 213*  --  182* 154* 150* 156*  BUN 20  --  22* 33* 36* 41*  CREATININE 1.48*  1.36* 1.36* 1.95* 1.80* 1.60*  CALCIUM 8.5*  --  8.4* 8.2* 8.1* 8.7*  MG  --   --   --  1.5* 1.8 2.1  PHOS  --   --   --  3.2  --   --    GFR: Estimated Creatinine Clearance: 28.5 mL/min (A) (by C-G formula based on SCr of 1.6 mg/dL (H)). Liver Function Tests: Recent Labs  Lab 06/04/17 0700  AST 25  ALT 15  ALKPHOS 112  BILITOT 1.9*  PROT 7.4  ALBUMIN 2.4*   No results for input(s): LIPASE, AMYLASE in the last 168 hours. No results for input(s): AMMONIA in the last 168 hours. Coagulation Profile: No results for input(s): INR, PROTIME in the last 168 hours. Cardiac  Enzymes: No results for input(s): CKTOTAL, CKMB, CKMBINDEX, TROPONINI in the last 168 hours. BNP (last 3 results) No results for input(s): PROBNP in the last 8760 hours. HbA1C: Recent Labs    06/04/17 0700  HGBA1C 7.7*   CBG: Recent Labs  Lab 06/05/17 0804 06/05/17 1140 06/05/17 1614 06/05/17 2058 06/06/17 0720  GLUCAP 167* 153* 108* 92 166*   Lipid Profile: No results for input(s): CHOL, HDL, LDLCALC, TRIG, CHOLHDL, LDLDIRECT in the last 72 hours. Thyroid Function Tests: No results for input(s): TSH, T4TOTAL, FREET4, T3FREE, THYROIDAB in the last 72 hours. Anemia Panel: No results for input(s): VITAMINB12, FOLATE, FERRITIN, TIBC, IRON, RETICCTPCT in the last 72 hours. Urine analysis:    Component Value Date/Time   COLORURINE YELLOW 01/04/2015 1620   APPEARANCEUR CLEAR 01/04/2015 1620   LABSPEC 1.017 01/04/2015 1620   PHURINE 5.0 01/04/2015 1620   GLUCOSEU NEGATIVE 01/04/2015 1620   HGBUR TRACE (A) 01/04/2015 1620   BILIRUBINUR NEGATIVE 01/04/2015 1620   BILIRUBINUR negative 08/05/2012 1435   KETONESUR NEGATIVE 01/04/2015 1620   PROTEINUR NEGATIVE 01/04/2015 1620   UROBILINOGEN 0.2 01/04/2015 1620   NITRITE NEGATIVE 01/04/2015 1620   LEUKOCYTESUR TRACE (A) 01/04/2015 1620   Sepsis Labs: @LABRCNTIP (procalcitonin:4,lacticidven:4)  ) Recent Results (from the past 240 hour(s))  Culture, blood (routine x 2)     Status: None (Preliminary result)   Collection Time: 06/02/17 11:31 AM  Result Value Ref Range Status   Specimen Description BLOOD RIGHT ANTECUBITAL  Final   Special Requests   Final    BOTTLES DRAWN AEROBIC AND ANAEROBIC Blood Culture adequate volume   Culture   Final    NO GROWTH 3 DAYS Performed at [facility], [place] [address], [place], [place] [address]    Report Status PENDING  Incomplete  Respiratory Panel by PCR     Status: None   Collection Time: 06/03/17 10:49 AM  Result Value Ref Range Status   Adenovirus NOT DETECTED NOT DETECTED Final    Coronavirus 229E NOT DETECTED NOT DETECTED Final   Coronavirus HKU1 NOT DETECTED NOT DETECTED Final   Coronavirus NL63 NOT DETECTED NOT DETECTED Final   Coronavirus OC43 NOT DETECTED NOT DETECTED Final   Metapneumovirus NOT DETECTED NOT DETECTED Final   Rhinovirus / Enterovirus NOT DETECTED NOT DETECTED Final   Influenza A NOT DETECTED NOT DETECTED Final   Influenza B NOT DETECTED NOT DETECTED Final   Parainfluenza Virus 1 NOT DETECTED NOT DETECTED Final   Parainfluenza Virus 2 NOT DETECTED NOT DETECTED Final   Parainfluenza Virus 3 NOT DETECTED NOT DETECTED Final   Parainfluenza Virus 4 NOT DETECTED NOT DETECTED Final   Respiratory Syncytial Virus NOT DETECTED NOT DETECTED Final   Bordetella pertussis NOT DETECTED NOT  DETECTED Final   Chlamydophila pneumoniae NOT DETECTED NOT DETECTED Final   Mycoplasma pneumoniae NOT DETECTED NOT DETECTED Final    Comment: Performed at [facility], [place] [address], [place], [place] [address]  MRSA PCR Screening     Status: None   Collection Time: 06/05/17 12:01 AM  Result Value Ref Range Status   MRSA by PCR NEGATIVE NEGATIVE Final    Comment:        The GeneXpert MRSA Assay (FDA approved for NASAL specimens only), is one component of a comprehensive MRSA colonization surveillance program. It is not intended to diagnose MRSA infection nor to guide or monitor treatment for MRSA infections. Performed at [facility], [place] [address] [place], [place] [address]       Radiology Studies: Dg Chest 2 View  Result Date: 06/04/2017 CLINICAL DATA:  Cough, congestion, productive cough 4 days EXAM: CHEST  2 VIEW COMPARISON:  06/02/2017 FINDINGS: Persistent right upper lobe and left lower lobe airspace disease most concerning for multilobar pneumonia. No pleural effusion or pneumothorax. Stable cardiomegaly. Dual lead cardiac pacemaker. No acute osseous abnormality. IMPRESSION: Persistent right upper lobe and left lower lobe airspace disease  most concerning for multilobar pneumonia. Followup PA and lateral chest X-ray is recommended in 3-4 weeks following trial of antibiotic therapy to ensure resolution and exclude underlying malignancy. Electronically Signed   By: Kathreen Devoid   On: 06/04/2017 14:41   Ct Chest Wo Contrast  Result Date: 06/06/2017 CLINICAL DATA:  Respiratory failure.  Inpatient. EXAM: CT CHEST WITHOUT CONTRAST TECHNIQUE: Multidetector CT imaging of the chest was performed following the standard protocol without IV contrast. COMPARISON:  Chest radiograph from one day prior. FINDINGS: Cardiovascular: Mild cardiomegaly, increased from prior CT abdomen study of 01/04/2015. No significant pericardial fluid/thickening. Three-vessel coronary atherosclerosis. Two lead left subclavian pacemaker with lead tips in the right atrium and right ventricular apex. Atherosclerotic nonaneurysmal thoracic aorta. Dilated main pulmonary artery (3.5 cm diameter). Mediastinum/Nodes: No discrete thyroid nodules. Unremarkable esophagus. No axillary adenopathy. Right paratracheal adenopathy measuring up to 1.8 cm (series 3/image 46). Mild bilateral hilar adenopathy, poorly delineated on the noncontrast images. Lungs/Pleura: No pneumothorax. No pleural effusion. There is diffuse thickening of the peribronchovascular interstitium in both lungs with associated mild parenchymal distortion and mild traction bronchiolectasis. There is extensive patchy ground-glass attenuation throughout both lungs with associated mild interlobular septal thickening. No discrete lung masses or significant pulmonary nodules. Patchy consolidation is present in dependent right upper lobe, lingula and dependent right lower lobe. No frank honeycombing. Upper abdomen: Small hiatal hernia. Musculoskeletal: No aggressive appearing focal osseous lesions. Moderate thoracic spondylosis. Partially visualized bilateral lumbar spinal fusion hardware. IMPRESSION: 1. Patchy consolidation in the  dependent right upper lobe, lingula and dependent right lower lobe. Extensive thickening of the peribronchovascular interstitium and extensive ground-glass attenuation throughout both lungs with associated mild traction bronchiolectasis and architectural distortion. Findings are suspected to represent ARDS complicating a multilobar pneumonia or acute interstitial pneumonia (AIP). The lungs were clear on the comparison chest radiographs dated 03/30/2014. A component of cardiogenic pulmonary edema cannot be excluded given the cardiomegaly. 2. Nonspecific mediastinal and bilateral hilar adenopathy, likely reactive. Recommend attention on follow-up chest CT with IV contrast in 3-6 months. 3. Dilated main pulmonary artery, suggesting pulmonary arterial hypertension. 4. Three-vessel coronary atherosclerosis. 5. Small hiatal hernia. Aortic Atherosclerosis (ICD10-I70.0). Electronically Signed   By: Ilona Sorrel M.D.   On: 06/06/2017 11:26   Dg Chest Port 1 View  Result Date: 06/05/2017  CLINICAL DATA:  Shortness of Breath EXAM: PORTABLE CHEST 1 VIEW COMPARISON:  06/04/2017 FINDINGS: Cardiac shadow is enlarged but stable. Pacing device is again seen. Persistent right upper lobe infiltrate is noted. Persistent changes in the left base are noted as well. No new focal infiltrate or effusion is seen. No bony abnormality is noted. IMPRESSION: Stable right upper and left lower lobe infiltrates. Electronically Signed   By: Inez Catalina M.D.   On: 06/05/2017 07:59     Scheduled Meds: . atorvastatin  20 mg Oral Daily  . bromocriptine  2.5 mg Oral Daily  . furosemide  40 mg Intravenous BID  . guaiFENesin  1,200 mg Oral BID  . insulin aspart  0-15 Units Subcutaneous TID WC  . insulin aspart  0-5 Units Subcutaneous QHS  . levothyroxine  44 mcg Intravenous Daily  . metoprolol tartrate  5 mg Intravenous Q6H   Continuous Infusions: . heparin 1,150 Units/hr (06/06/17 0616)  . levofloxacin (LEVAQUIN) IV Stopped (06/05/17  1736)     LOS: 4 days   Time Spent in minutes   45 minutes  Mariapaula Krist D.O. on 06/06/2017 at 11:42 AM  Between 7am to 7pm - Pager - [phone]  After 7pm go to [email or web address] - password [email or web address]  And look for the night coverage person covering for me after hours  [facility] Office  [phone]

## 2017-06-06 NOTE — Progress Notes (Signed)
[place] for Eliquis to Heparin  Indication: atrial fibrillation/ rule out PE  Allergies  Allergen Reactions  . Morphine And Related Itching, Nausea And Vomiting, Rash and Other (See Comments)    01/04/15- patient says she is NOT allergic to morphine.  Morphine IV given this AM 01/04/15 in the ED. No adverse reaction.  Celesta Gentile [Sitagliptin Phosphate] Other (See Comments)    Pt says this caused elevated liver enzymes and creatinine. IS TAKING 1/2 TABLET  . Actos [Pioglitazone Hydrochloride] Swelling    UNSPECIFIED REACTION   . Hydrocodone-Acetaminophen Itching, Nausea And Vomiting and Other (See Comments)    Per pt 05/12/15 she had this recently in the hospital with no issues at all  . Keflex [Cephalexin] Rash and Other (See Comments)    Burning sensation all over  . Percocet [Oxycodone-Acetaminophen] Nausea And Vomiting    Patient Measurements: Height: 5\' 4"  (162.6 cm) Weight: 157 lb 10.1 oz (71.5 kg) IBW/kg (Calculated) : 54.7   Vital Signs: Temp: 98.1 F (36.7 C) (02/22 0722) Temp Source: Oral (02/22 0722) BP: 132/78 (02/22 0920) Pulse Rate: 99 (02/22 0920)  Labs: Recent Labs    06/04/17 0700 06/05/17 0438 06/05/17 1814 06/06/17 0520  HGB 9.6* 9.4*  --  9.8*  HCT 29.7* 29.2*  --  31.4*  PLT 319 308  --  336  APTT  --   --  65* 78*  HEPARINUNFRC  --   --  >2.20* >2.20*  CREATININE 1.95* 1.80*  --  1.60*    Estimated Creatinine Clearance: 28.5 mL/min (A) (by C-G formula based on SCr of 1.6 mg/dL (H)).     Assessment: 77 year old female on Eliquis for Afib, last dose 2/20 at 10 pm, continues on heparin for AFib/rule out PE Doppler/VQ scan pending  PTT now within range No bleeding noted  Goal of Therapy:  aPTT 66-102 seconds seconds Monitor platelets by anticoagulation protocol: Yes  Heparin level = 0.3-0.7   Plan:  Continue heparin at 1150 units / hr Daily heparin level, PTT, CBC  Thank you Anette Guarneri,  PharmD [phone]  06/06/2017,10:00 AM

## 2017-06-06 NOTE — Progress Notes (Signed)
Progress Note  Patient Name: Becky Forbes Date of Encounter: 06/06/2017  Primary Cardiologist: Mujtaba Bollig  Subjective   77 year old female with a history of acute on chronic combined systolic and diastolic congestive heart failure, diabetes mellitus, atrial fibrillation, chronic kidney disease stage III, She was admitted on February 18 with increasing shortness of breath, cough  She has had continued hypoxemia through this admission.  She is required BiPAP.  Chest x-ray has been suggestive of pneumonia.  CT angio of chest ( by my reading )  shows extensive ground glass and fibrotic changes in the bases  bilaterally   Inpatient Medications    Scheduled Meds: . atorvastatin  20 mg Oral Daily  . bromocriptine  2.5 mg Oral Daily  . furosemide  40 mg Intravenous BID  . guaiFENesin  1,200 mg Oral BID  . insulin aspart  0-15 Units Subcutaneous TID WC  . insulin aspart  0-5 Units Subcutaneous QHS  . levothyroxine  44 mcg Intravenous Daily  . metoprolol tartrate  5 mg Intravenous Q6H   Continuous Infusions: . heparin 1,150 Units/hr (06/06/17 0616)  . levofloxacin (LEVAQUIN) IV Stopped (06/05/17 1736)   PRN Meds: acetaminophen, ipratropium, levalbuterol, ondansetron **OR** ondansetron (ZOFRAN) IV, polyethylene glycol   Vital Signs    Vitals:   06/06/17 0812 06/06/17 0814 06/06/17 0920 06/06/17 1100  BP:   132/78   Pulse: 94 (!) 101 99 (!) 110  Resp: (!) 21 18 19  (!) 23  Temp:      TempSrc:      SpO2: 92% (!) 87% 92% 98%  Weight:      Height:        Intake/Output Summary (Last 24 hours) at 06/06/2017 1106 Last data filed at 06/06/2017 1245 Gross per 24 hour  Intake 314.89 ml  Output 2300 ml  Net -1985.11 ml   Filed Weights   06/04/17 0446 06/05/17 0608 06/06/17 0402  Weight: 159 lb 3.2 oz (72.2 kg) 159 lb 13.3 oz (72.5 kg) 157 lb 10.1 oz (71.5 kg)    Telemetry    Atrial fib  - Personally Reviewed  ECG    Atrial fib  - Personally Reviewed  Physical Exam   GEN:  elderly female, mod respiratory distress , on BIPAP   Neck: No JVD Cardiac: RRR, no murmurs, rubs, or gallops.  Respiratory:  rales and coarse rales / rhonchi throughout - especially in lower lung fields  GI: Soft, nontender, non-distended  MS: No edema; No deformity. Neuro:  Nonfocal  Psych: Normal affect   Labs    Chemistry Recent Labs  Lab 06/04/17 0700 06/05/17 0438 06/06/17 0520  NA 133* 134* 140  K 3.7 3.3* 3.8  CL 96* 97* 99*  CO2 22 24 27   GLUCOSE 154* 150* 156*  BUN 33* 36* 41*  CREATININE 1.95* 1.80* 1.60*  CALCIUM 8.2* 8.1* 8.7*  PROT 7.4  --   --   ALBUMIN 2.4*  --   --   AST 25  --   --   ALT 15  --   --   ALKPHOS 112  --   --   BILITOT 1.9*  --   --   GFRNONAA 24* 26* 30*  GFRAA 27* 30* 35*  ANIONGAP 15 13 14      Hematology Recent Labs  Lab 06/04/17 0700 06/05/17 0438 06/06/17 0520  WBC 15.7* 15.2* 12.3*  RBC 3.21* 3.20* 3.36*  HGB 9.6* 9.4* 9.8*  HCT 29.7* 29.2* 31.4*  MCV 92.5 91.3 93.5  MCH  29.9 29.4 29.2  MCHC 32.3 32.2 31.2  RDW 14.9 14.8 15.3  PLT 319 308 336    Cardiac EnzymesNo results for input(s): TROPONINI in the last 168 hours.  Recent Labs  Lab 06/02/17 1146  TROPIPOC 0.04     BNP Recent Labs  Lab 06/02/17 1123  BNP 2,340.8*     DDimer  Recent Labs  Lab 06/05/17 0730  DDIMER 2.43*     Radiology    Dg Chest 2 View  Result Date: 06/04/2017 CLINICAL DATA:  Cough, congestion, productive cough 4 days EXAM: CHEST  2 VIEW COMPARISON:  06/02/2017 FINDINGS: Persistent right upper lobe and left lower lobe airspace disease most concerning for multilobar pneumonia. No pleural effusion or pneumothorax. Stable cardiomegaly. Dual lead cardiac pacemaker. No acute osseous abnormality. IMPRESSION: Persistent right upper lobe and left lower lobe airspace disease most concerning for multilobar pneumonia. Followup PA and lateral chest X-ray is recommended in 3-4 weeks following trial of antibiotic therapy to ensure resolution and  exclude underlying malignancy. Electronically Signed   By: Kathreen Devoid   On: 06/04/2017 14:41   Dg Chest Port 1 View  Result Date: 06/05/2017 CLINICAL DATA:  Shortness of Breath EXAM: PORTABLE CHEST 1 VIEW COMPARISON:  06/04/2017 FINDINGS: Cardiac shadow is enlarged but stable. Pacing device is again seen. Persistent right upper lobe infiltrate is noted. Persistent changes in the left base are noted as well. No new focal infiltrate or effusion is seen. No bony abnormality is noted. IMPRESSION: Stable right upper and left lower lobe infiltrates. Electronically Signed   By: Inez Catalina M.D.   On: 06/05/2017 07:59    Cardiac Studies      Patient Profile     77 y.o. female history of congestive heart failure and atrial fibrillation.  She presents with respiratory failure and now has developed ARDS.  Assessment & Plan    1.  Respiratory failure: Ms. Stull has not improved much.  Her CT scan today is consistent with ARDS.  Diffuse groundglass appearance and  evidence of pulmonary fibrosis. I do not think that her symptoms are due to amiodarone although this has been discontinued.  It is possible that this is partially due to her acute on chronic combined systolic and diastolic congestive heart failure.  She has diuresed about 3.8 L so I doubt that this is a major can contributor at this time.  2.  Atrial fibrillation: Her A. fib rate is fairly well controlled.  We had stopped amiodarone.  Continue other medications.  For questions or updates, please contact [place] [place] Please consult [email or web address] for contact info under Cardiology/STEMI.      Signed, Mertie Moores, MD  06/06/2017, 11:06 AM

## 2017-06-06 NOTE — Significant Event (Signed)
Rapid Response Event Note  Overview:  Initial Focused Assessment: Patient with CTA chest, this am:   Dr Cathie Olden at bedside to assess patient. Patient desat on 50% BIpap to 86% Patient alert, starting to look tired.  Still states that she feel "a little SOB" Daughter and husband at bedside CCM consulted  Interventions: Increased Fio2 to 100 % on Bipap.  O2 sats improved to 100%  1500  CCM started solumedrol.  Transitioned patient to HFNC.  1700 maintaining O2 sat 94% on HFNC 45L/60% FiO2  Plan of Care (if not transferred): Rn to call if assistance needed  Event Summary: Name of Physician Notified: Dr Ree Kida at    Name of Consulting Physician Notified: Dr Cathie Olden at    Outcome: Transferred (Comment)     Raliegh Ip

## 2017-06-06 NOTE — Care Management Note (Addendum)
Case Management Note  Patient Details  Name: Becky Forbes MRN: [id number] Date of Birth: Jan 24, 1941  Subjective/Objective:  Pt presented for Acute Respiratory Failure and Atrial Fib. Initiated on BIPAP. PTA independent from home. Benefits Check completed for Eliquis. Pt uses [facility] and medication is available.  Pt will need Rx for 30 day supply to go along with 30 day free card-pt has. CM will continue to monitor for home 02 needs.                   Action/Plan: S/W [facility] @ [facility] # [phone]   ELIQUIS 5 MG BID  COVER- YES  CO-PAY- $ 47.00  TIER- 3 DRUG  PRIOR APPROVAL- NO   MAIL-ORDER FOR 90 DAY SUPPLY $ 105.00  PREFERRED -PHARMACY : [facility]   Expected Discharge Date:  06/04/17               Expected Discharge Plan:  Home/Self Care  In-House Referral:  NA  Discharge planning Services  CM Consult  Post Acute Care Choice:  Durable Medical Equipment Choice offered to:     DME Arranged:  Oxygen(Following for oxygen needs. ) DME Agency:   N/A  HH Arranged:   N/A HH Agency:   N/A  Status of Service: COMPLETED If discussed at [place] of Stay Meetings, dates discussed:  06-10-17, 06-12-17  Additional Comments: 06-12-17 [place], RN, BSN [phone] CM did speak with patient and family in regards in regards to LTAC- Pt is agreeable to [facility] and choice offered. [facility] chose [facility] and Referral given to Liaison. MD aware that pt is agreeable to [facility]- plan for d/c 06-13-17 once bed available. No further needs from CM at this time.  Bethena Roys, RN 06/06/2017, 11:33 AM

## 2017-06-07 LAB — GLUCOSE, CAPILLARY
GLUCOSE-CAPILLARY: 270 mg/dL — AB (ref 65–99)
GLUCOSE-CAPILLARY: 205 mg/dL — AB (ref 65–99)
Glucose-Capillary: 236 mg/dL — ABNORMAL HIGH (ref 65–99)
Glucose-Capillary: 298 mg/dL — ABNORMAL HIGH (ref 65–99)

## 2017-06-07 LAB — MPO/PR-3 (ANCA) ANTIBODIES
ANCA Proteinase 3: <3.5 U/mL (ref 0.0–3.5)
Myeloperoxidase Abs: <9 U/mL (ref 0.0–9.0)

## 2017-06-07 LAB — BASIC METABOLIC PANEL
ANION GAP: 13 (ref 5–15)
BUN: 56 mg/dL — ABNORMAL HIGH (ref 6–20)
CALCIUM: 8.7 mg/dL — AB (ref 8.9–10.3)
CO2: 30 mmol/L (ref 22–32)
CREATININE: 1.72 mg/dL — AB (ref 0.44–1.00)
Chloride: 94 mmol/L — ABNORMAL LOW (ref 101–111)
GFR, EST AFRICAN AMERICAN: 32 mL/min — AB (ref >60)
GFR, EST NON AFRICAN AMERICAN: 27 mL/min — AB (ref >60)
Glucose, Bld: 244 mg/dL — ABNORMAL HIGH (ref 65–99)
Potassium: 4.1 mmol/L (ref 3.5–5.1)
Sodium: 137 mmol/L (ref 135–145)

## 2017-06-07 LAB — SJOGRENS SYNDROME-A EXTRACTABLE NUCLEAR ANTIBODY

## 2017-06-07 LAB — CBC
HCT: 32.8 % — ABNORMAL LOW (ref 36.0–46.0)
Hemoglobin: 10.5 g/dL — ABNORMAL LOW (ref 12.0–15.0)
MCH: 29.6 pg (ref 26.0–34.0)
MCHC: 32 g/dL (ref 30.0–36.0)
MCV: 92.4 fL (ref 78.0–100.0)
PLATELETS: 346 10*3/uL (ref 150–400)
RBC: 3.55 MIL/uL — AB (ref 3.87–5.11)
RDW: 15 % (ref 11.5–15.5)
WBC: 11.8 10*3/uL — AB (ref 4.0–10.5)

## 2017-06-07 LAB — HEPARIN LEVEL (UNFRACTIONATED): Heparin Unfractionated: 2.1 IU/mL — ABNORMAL HIGH (ref 0.30–0.70)

## 2017-06-07 LAB — CULTURE, BLOOD (ROUTINE X 2)
CULTURE: NO GROWTH
Special Requests: ADEQUATE

## 2017-06-07 LAB — RHEUMATOID FACTOR: Rhuematoid fact SerPl-aCnc: 11.8 IU/mL (ref 0.0–13.9)

## 2017-06-07 LAB — SJOGRENS SYNDROME-B EXTRACTABLE NUCLEAR ANTIBODY

## 2017-06-07 LAB — APTT: aPTT: 66 seconds — ABNORMAL HIGH (ref 24–36)

## 2017-06-07 MED ORDER — ORAL CARE MOUTH RINSE
15.0000 mL | Freq: Two times a day (BID) | OROMUCOSAL | Status: DC
  Administered 2017-06-07: 15 mL via OROMUCOSAL

## 2017-06-07 MED ORDER — INSULIN GLARGINE 100 UNIT/ML ~~LOC~~ SOLN
12.0000 [IU] | Freq: Every day | SUBCUTANEOUS | Status: DC
  Filled 2017-06-07: qty 0.12

## 2017-06-07 MED ORDER — ORAL CARE MOUTH RINSE
15.0000 mL | Freq: Two times a day (BID) | OROMUCOSAL | Status: DC
  Administered 2017-06-07 – 2017-06-13 (×9): 15 mL via OROMUCOSAL

## 2017-06-07 NOTE — Progress Notes (Signed)
Name: Becky Forbes MRN: [id number] DOB: 1941/03/06    ADMISSION DATE:  06/02/2017 CONSULTATION DATE:  06/06/17  REFERRING MD :  Dr. 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    The patient reports she has had decreased activity tolerance since January 2019 and shortness of breath.  She reports she thinks she has had some weight loss.  She denies swelling in her lower extremities or pain.  She reports chest pressure at times.  She denies fevers, chills, nausea, vomiting, choking / difficulty swallowing, n/v/d, viral illness / sick contacts.  She went to [place] to shop with her sister the weekend before admit and after "just crashed".    She has been married for 13 years.  Her husband used to smoke but quit 40+ years ago.  She has had no known occupational exposures, wood heat, mold, birds / Curator.  The patient reports she has been on Amiodarone for approximately 3 years but was recently decreased to 3 times weekly (followed by Dr. Cathie Olden).      Since admit, she has been on BiPAP / increased O2 since admit.  Work up for PE is pending.  Despite high O2 needs her work of breathing has been normal.  Cardiology was consulted and she was diuresed 3.8L since admit.  ECHO revealed new reduction of LVEF to 30-35%.  She remains in AF with rate of 100-120's.  CT of the chest without contrast assessed which demonstrated diffuse bilateral ground glass.     PCCM consulted for pulmonary evaluation.    SUBJECTIVE:  Pt reports no work of breathing but states her mouth is very dry.  Wore bipap overnight.  Now on HFNC 45L / 60%  VITAL SIGNS: Temp:  [97.5 F (36.4 C)-98.5 F (36.9 C)] 97.6 F (36.4 C) (02/23 0804) Pulse Rate:  [82-117] 82 (02/23 0804) Resp:  [14-24] 17 (02/23 0804) BP: (94-129)/(57-76) 124/67 (02/23 0812) SpO2:  [86 %-100 %] 97 % (02/23 0804) FiO2 (%):   [50 %-100 %] 60 % (02/23 0825) Weight:  [156 lb 12 oz (71.1 kg)] 156 lb 12 oz (71.1 kg) (02/23 0336)  PHYSICAL EXAMINATION: General: elderly female in NAD lying in bed HEENT: MM pink/dry PSY: calm/appropriate  Neuro: AAOx4, MAE  CV: s1s2 rrr, no m/r/g PULM: even/non-labored, lungs bilaterally clear anterior, diffuse crackles 2/3 way up posterior  AU:QJFH, non-tender, bsx4 active  Extremities: warm/dry, no edema  Skin: no rashes or lesions  Recent Labs  Lab 06/05/17 0438 06/06/17 0520 06/07/17 0323  NA 134* 140 137  K 3.3* 3.8 4.1  CL 97* 99* 94*  CO2 _0 BUN 36* 41* 56*  CREATININE 1.80* 1.60* 1.72*  GLUCOSE 150* 156* 244*    Recent Labs  Lab 06/05/17 0438 06/06/17 0520 06/07/17 0323  HGB 9.4* 9.8* 10.5*  HCT 29.2* 31.4* 32.8*  WBC 15.2* 12.3* 11.8*  PLT 308 336 346    Ct Chest Wo Contrast  Result Date: 06/06/2017 CLINICAL DATA:  Respiratory failure.  Inpatient. EXAM: CT CHEST WITHOUT CONTRAST TECHNIQUE: Multidetector CT imaging of the chest was performed following the standard protocol without IV contrast. COMPARISON:  Chest radiograph from one day prior. FINDINGS: Cardiovascular: Mild cardiomegaly, increased from prior CT abdomen study of 01/04/2015. No significant pericardial fluid/thickening. Three-vessel coronary atherosclerosis. Two lead left subclavian pacemaker with lead tips in the  right atrium and right ventricular apex. Atherosclerotic nonaneurysmal thoracic aorta. Dilated main pulmonary artery (3.5 cm diameter). Mediastinum/Nodes: No discrete thyroid nodules. Unremarkable esophagus. No axillary adenopathy. Right paratracheal adenopathy measuring up to 1.8 cm (series 3/image 46). Mild bilateral hilar adenopathy, poorly delineated on the noncontrast images. Lungs/Pleura: No pneumothorax. No pleural effusion. There is diffuse thickening of the peribronchovascular interstitium in both lungs with associated mild parenchymal distortion and mild traction  bronchiolectasis. There is extensive patchy ground-glass attenuation throughout both lungs with associated mild interlobular septal thickening. No discrete lung masses or significant pulmonary nodules. Patchy consolidation is present in dependent right upper lobe, lingula and dependent right lower lobe. No frank honeycombing. Upper abdomen: Small hiatal hernia. Musculoskeletal: No aggressive appearing focal osseous lesions. Moderate thoracic spondylosis. Partially visualized bilateral lumbar spinal fusion hardware. IMPRESSION: 1. Patchy consolidation in the dependent right upper lobe, lingula and dependent right lower lobe. Extensive thickening of the peribronchovascular interstitium and extensive ground-glass attenuation throughout both lungs with associated mild traction bronchiolectasis and architectural distortion. Findings are suspected to represent ARDS complicating a multilobar pneumonia or acute interstitial pneumonia (AIP). The lungs were clear on the comparison chest radiographs dated 03/30/2014. A component of cardiogenic pulmonary edema cannot be excluded given the cardiomegaly. 2. Nonspecific mediastinal and bilateral hilar adenopathy, likely reactive. Recommend attention on follow-up chest CT with IV contrast in 3-6 months. 3. Dilated main pulmonary artery, suggesting pulmonary arterial hypertension. 4. Three-vessel coronary atherosclerosis. 5. Small hiatal hernia. Aortic Atherosclerosis (ICD10-I70.0). Electronically Signed   By: Ilona Sorrel M.D.   On: 06/06/2017 11:26      SIGNIFICANT EVENTS  2/19  Admit  2/22  PCCM consulted   STUDIES 2/22 CT Chest w/o >> Patchy consolidation in the dependent right upper lobe, lingula and dependent right lower lobe. Extensive thickening of the peribronchovascular interstitium and extensive ground-glass attenuation throughout both lungs with associated mild traction bronchiolectasis and architectural distortion. Nonspecific mediastinal and bilateral hilar  adenopathy, likely reactive.  Dilated main pulmonary artery, suggesting pulmonary arterial Hypertension. Three-vessel coronary atherosclerosis.  Small hiatal hernia.  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  She has a hx of chronic combined CHF and has been diuresed 3.8L and is clinically euvolemic.  She does not have any known history of autoimmune disease. In 2016 she had a CT of the Abd which showed her lower lungs that appeared normal.  She has been on amiodarone for AF the last 3 years.  Symptoms began with slow SOB in January 2019 and mostly non-productive cough.  DDx includes amiodarone lung toxicity (she is also on bromocriptine which can be associated with pulmonary fibrosis), autoimmune pulmonary manifestation, cardiogenic pulmonary edema & hypersensitivity pneumonitis.   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    Noe Gens, NP-C [facility] Pgr: [phone] or if no answer [phone] 06/07/2017, 11:03 AM

## 2017-06-07 NOTE — Progress Notes (Signed)
[place] for Eliquis to Heparin  Indication: atrial fibrillation  Allergies  Allergen Reactions  . Morphine And Related Itching, Nausea And Vomiting, Rash and Other (See Comments)    01/04/15- patient says she is NOT allergic to morphine.  Morphine IV given this AM 01/04/15 in the ED. No adverse reaction.  Celesta Gentile [Sitagliptin Phosphate] Other (See Comments)    Pt says this caused elevated liver enzymes and creatinine. IS TAKING 1/2 TABLET  . Actos [Pioglitazone Hydrochloride] Swelling    UNSPECIFIED REACTION   . Hydrocodone-Acetaminophen Itching, Nausea And Vomiting and Other (See Comments)    Per pt 05/12/15 she had this recently in the hospital with no issues at all  . Keflex [Cephalexin] Rash and Other (See Comments)    Burning sensation all over  . Percocet [Oxycodone-Acetaminophen] Nausea And Vomiting    Patient Measurements: Height: 5\' 4"  (162.6 cm) Weight: 156 lb 12 oz (71.1 kg) IBW/kg (Calculated) : 54.7  Vital Signs: Temp: 97.5 F (36.4 C) (02/23 0336) Temp Source: Axillary (02/23 0336) BP: 129/76 (02/23 0336) Pulse Rate: 85 (02/23 0336)  Labs: Recent Labs    06/05/17 0438 06/05/17 1814 06/06/17 0520 06/07/17 0323  HGB 9.4*  --  9.8* 10.5*  HCT 29.2*  --  31.4* 32.8*  PLT 308  --  336 346  APTT  --  65* 78* 66*  HEPARINUNFRC  --  >2.20* >2.20* 2.10*  CREATININE 1.80*  --  1.60* 1.72*    Estimated Creatinine Clearance: 26.5 mL/min (A) (by C-G formula based on SCr of 1.72 mg/dL (H)).  Assessment: 77 year old female on Eliquis for Afib, last dose 2/20 at 10 pm, continues on heparin for Afib.   Doppler/VQ scan > prelim results are negative for DVT   PTT within range: 66 sec No bleeding noted  Goal of Therapy:  aPTT 66-102 seconds Monitor platelets by anticoagulation protocol: Yes  Heparin level = 0.3-0.7   Plan:  Continue heparin at 1150 units / hr Daily heparin level, PTT, CBC   Diana L. Kyung Rudd, PharmD,  [place] PGY1 Pharmacy Resident Pager: [phone]

## 2017-06-07 NOTE — Progress Notes (Signed)
RT placed patient on NIV HS. Patient tolerating well at this time.

## 2017-06-07 NOTE — Progress Notes (Signed)
PROGRESS NOTE    Becky Forbes  QVZ:[id number] DOB: December 16, 1940 DOA: 06/02/2017 PCP: Binnie Rail, MD   Chief Complaint  Patient presents with  . Shortness of Breath  . Atrial Fibrillation  . Chest Pain    Brief Narrative:  HPI on 06/02/2017 by Ms. [address] Becky Forbes is a 77 y.o. female with medical history significant of sick sinus syndrome with implanted pacemaker, paroxysmal atrial fibrillation, hypothyroidism, combined diastolic and systolic congestive heart failure, chronic kidney disease, hypothyroidism, diabetes mellitus who presented to the emergency department with complaints of worsening shortness of breath, cough productive of white sputum and chest tightness.  Patient reported that since December she was battling walled with cough and shortness of breath off and on and on Friday he was traveling to [place] to see his sister, had a lot of walking around and on the way back her daughter noticed that patient developed frequent coughs and some shortness of breath.  Since Friday her condition worsened and eventually this night she could not sleep at all and had to come to the ED for evaluation. She denied PND, orthopnea, chest pain, diaphoresis, lower extremity swelling, abdominal pain, she complained of nausea and dry heaves, but no vomiting.   Interim history Admitted for acute respiratory failure with hypoxia.  Patient found to have a reduced EF, cardiology consulted and appreciated. Continues to be hypoxic and unable to wean off of bipap. Obtained CT chest w/o contrast which showed ARDS. PCCM consulted.  Assessment & Plan   Acute respiratory failure with hypoxia  -Suspect multifactorial including systolic CHF exacerbation, atrial fibrillation versus flutter, suspected pneumonia -Currently requiring BiPAP and unable to wean  -Continue nebulizer treatments -currently NPO -DDimer 2.43 -Have ordered a VQ scan however this cannot be done as patient is currently on  BiPAP. Lower extremity doppler negative for DVT -Patient was recently placed on Eliquis however may have had blood clot prior to starting this medication -Ordered CT chest w/o contrast: suspected ARDS complicating a multilobar pneumonia or acute interstitial pneumonia.  -PCCM consulted and appreciated- recommended high dose steroids, discontinuation of amiodarone and bromocriptine  Acute on chronic systolic CHF exacerbation -Echocardiogram showed EF of 30-35% (echocardiogram December 2015 showed an EF of 50-55%) -BNP on admission 2340.8 -Placed on IV Lasix 40 mg twice daily -Monitor intake and output, daily weights  -cardiology consulted and appreciated -urine output over past 24 hours 1500cc  Sepsis secondary to Pneumonia -Chest x-ray on admission showed new bilateral pulmonary infiltrates- possible pneumonia. -upon admission, patient with leukocytosis, tachycardia and elevated lactic acid level -Continue Levaquin -Blood culture shows no growth to date -Urine strep pneumonia antigen negative -Repeat chest x-ray today showed stable right upper and left lower lobe infiltrates -CT scan noted above  Paroxysmal atrial fibrillation/atrial flutter -Patient was in normal sinus rhythm at previous cardiology visit per outpatient notes -CHADSVASC 6 -TSH 0.405 -Patient placed on Eliquis-transition to heparin as patient currently on BiPAP and is NPO -Amiodarone discontinued due to respiratory symptoms -Cardiology consulted and appreciated  Acute kidney injury on chronic kidney disease, stage III -Creatinine currently 1.72, baseline  approximately 1.3-1.4  Essential hypertension -Ramipril held -Continue metoprolol-will change to IV form 5 mg every 6 hours with holding parameters  Diabetes mellitus, type II -Home Januvia and repaglinide held -Continue insulin sliding scale CBG monitoring -lantus added as patient is currently on high dose steroids -Hemoglobin A1c 7.7   Hypomagnesemia    -replaced -Continue to monitor and replace as needed  Hypothyroidism -TSH  0.405 -Continue IV synthroid   Hypokalemia -Secondary to diuresis -replaced, continue to monitor BMP  Hyperlipidemia -Continue statin  History of GI bleed -Secondary to ischemic colitis, patient was on Coumadin at that time -Case was briefly discussed with cardiologist, Dr. Acie Fredrickson, who recommended anticoagulation given her high CHADSVASC score -Closely monitor for signs and symptoms of bleeding -Continue to monitor CBC-hemoglobin appears to be stable, currently 10.5  Sick sinus syndrome -Has dual-chamber pacemaker, and currently on telemetry  Nausea -Continue Zofran as needed  DVT Prophylaxis  Heparin   Code Status: Full  Family Communication: Family at bedside  Disposition Plan:  Remains Admitted due to respiratory failure and need for BIPAP   Consultants Cardiology PCCM  Procedures  Echocardiogram Lower extremity doppler  Antibiotics   Anti-infectives (From admission, onward)   Start     Dose/Rate Route Frequency Ordered Stop   06/03/17 1400  levofloxacin (LEVAQUIN) IVPB 500 mg     500 mg 100 mL/hr over 60 Minutes Intravenous Every 24 hours 06/02/17 1329 06/08/17 1359   06/02/17 1230  levofloxacin (LEVAQUIN) IVPB 500 mg     500 mg 100 mL/hr over 60 Minutes Intravenous  Once 06/02/17 1158 06/02/17 1543      Subjective:   Becky Forbes seen and examined today.  Continue to be on BiPAP.  Denies any current chest pain, abdominal pain, nausea or vomiting, diarrhea or constipation.  Per daughter at bedside, patient was able to rest well overnight.  Objective:   Vitals:   06/07/17 0336 06/07/17 0804 06/07/17 0812 06/07/17 1151  BP: 129/76 124/67 124/67 121/61  Pulse: 85 82  85  Resp: 17 17    Temp: (!) 97.5 F (36.4 C) 97.6 F (36.4 C)  97.6 F (36.4 C)  TempSrc: Axillary Axillary  Axillary  SpO2: 98% 97%  (!) 89%  Weight: 71.1 kg (156 lb 12 oz)     Height:         Intake/Output Summary (Last 24 hours) at 06/07/2017 1426 Last data filed at 06/07/2017 0340 Gross per 24 hour  Intake 480 ml  Output 400 ml  Net 80 ml   Filed Weights   06/05/17 0608 06/06/17 0402 06/07/17 0336  Weight: 72.5 kg (159 lb 13.3 oz) 71.5 kg (157 lb 10.1 oz) 71.1 kg (156 lb 12 oz)   Exam  General: Well developed, well nourished, NAD, appears stated age  75: NCAT, mucous membranes moist. BIPAP in place  Neck: Supple  Cardiovascular: S1 S2 auscultated, irregular, no murmur  Respiratory: clear breath sounds anteriorly   Abdomen: Soft, nontender, nondistended, + bowel sounds  Extremities: warm dry without cyanosis clubbing or edema  Neuro: AAOx3, nonfocal  Skin: Without rashes exudates or nodules  Psych: Appropriate mood and affect  Data Reviewed: I have personally reviewed following labs and imaging studies  CBC: Recent Labs  Lab 06/03/17 0453 06/04/17 0700 06/05/17 0438 06/06/17 0520 06/07/17 0323  WBC 11.8* 15.7* 15.2* 12.3* 11.8*  NEUTROABS 10.1* 14.0*  --   --   --   HGB 10.1* 9.6* 9.4* 9.8* 10.5*  HCT 31.2* 29.7* 29.2* 31.4* 32.8*  MCV 92.3 92.5 91.3 93.5 92.4  PLT 246 319 308 336 275   Basic Metabolic Panel: Recent Labs  Lab 06/03/17 0453 06/04/17 0700 06/05/17 0438 06/06/17 0520 06/07/17 0323  NA 134* 133* 134* 140 137  K 3.7 3.7 3.3* 3.8 4.1  CL 100* 96* 97* 99* 94*  CO2 20* 22 24 27 30   GLUCOSE 182* 154* 150* 156* 244*  BUN 22* 33* 36* 41* 56*  CREATININE 1.36* 1.95* 1.80* 1.60* 1.72*  CALCIUM 8.4* 8.2* 8.1* 8.7* 8.7*  MG  --  1.5* 1.8 2.1  --   PHOS  --  3.2  --   --   --    GFR: Estimated Creatinine Clearance: 26.5 mL/min (A) (by C-G formula based on SCr of 1.72 mg/dL (H)). Liver Function Tests: Recent Labs  Lab 06/04/17 0700  AST 25  ALT 15  ALKPHOS 112  BILITOT 1.9*  PROT 7.4  ALBUMIN 2.4*   No results for input(s): LIPASE, AMYLASE in the last 168 hours. No results for input(s): AMMONIA in the last 168  hours. Coagulation Profile: No results for input(s): INR, PROTIME in the last 168 hours. Cardiac Enzymes: No results for input(s): CKTOTAL, CKMB, CKMBINDEX, TROPONINI in the last 168 hours. BNP (last 3 results) No results for input(s): PROBNP in the last 8760 hours. HbA1C: No results for input(s): HGBA1C in the last 72 hours. CBG: Recent Labs  Lab 06/06/17 1232 06/06/17 1701 06/06/17 2126 06/07/17 0804 06/07/17 1146  GLUCAP 104* 125* 230* 236* 270*   Lipid Profile: No results for input(s): CHOL, HDL, LDLCALC, TRIG, CHOLHDL, LDLDIRECT in the last 72 hours. Thyroid Function Tests: No results for input(s): TSH, T4TOTAL, FREET4, T3FREE, THYROIDAB in the last 72 hours. Anemia Panel: No results for input(s): VITAMINB12, FOLATE, FERRITIN, TIBC, IRON, RETICCTPCT in the last 72 hours. Urine analysis:    Component Value Date/Time   COLORURINE YELLOW 01/04/2015 1620   APPEARANCEUR CLEAR 01/04/2015 1620   LABSPEC 1.017 01/04/2015 1620   PHURINE 5.0 01/04/2015 1620   GLUCOSEU NEGATIVE 01/04/2015 1620   HGBUR TRACE (A) 01/04/2015 1620   BILIRUBINUR NEGATIVE 01/04/2015 1620   BILIRUBINUR negative 08/05/2012 1435   KETONESUR NEGATIVE 01/04/2015 1620   PROTEINUR NEGATIVE 01/04/2015 1620   UROBILINOGEN 0.2 01/04/2015 1620   NITRITE NEGATIVE 01/04/2015 1620   LEUKOCYTESUR TRACE (A) 01/04/2015 1620   Sepsis Labs: @LABRCNTIP (procalcitonin:4,lacticidven:4)  ) Recent Results (from the past 240 hour(s))  Culture, blood (routine x 2)     Status: None   Collection Time: 06/02/17 11:31 AM  Result Value Ref Range Status   Specimen Description BLOOD RIGHT ANTECUBITAL  Final   Special Requests   Final    BOTTLES DRAWN AEROBIC AND ANAEROBIC Blood Culture adequate volume   Culture   Final    NO GROWTH 5 DAYS Performed at [facility], [place] [address] [place], [place] [address]    Report Status 06/07/2017 FINAL  Final  Respiratory Panel by PCR     Status: None   Collection Time:  06/03/17 10:49 AM  Result Value Ref Range Status   Adenovirus NOT DETECTED NOT DETECTED Final   Coronavirus 229E NOT DETECTED NOT DETECTED Final   Coronavirus HKU1 NOT DETECTED NOT DETECTED Final   Coronavirus NL63 NOT DETECTED NOT DETECTED Final   Coronavirus OC43 NOT DETECTED NOT DETECTED Final   Metapneumovirus NOT DETECTED NOT DETECTED Final   Rhinovirus / Enterovirus NOT DETECTED NOT DETECTED Final   Influenza A NOT DETECTED NOT DETECTED Final   Influenza B NOT DETECTED NOT DETECTED Final   Parainfluenza Virus 1 NOT DETECTED NOT DETECTED Final   Parainfluenza Virus 2 NOT DETECTED NOT DETECTED Final   Parainfluenza Virus 3 NOT DETECTED NOT DETECTED Final   Parainfluenza Virus 4 NOT DETECTED NOT DETECTED Final   Respiratory Syncytial Virus NOT DETECTED NOT DETECTED Final   Bordetella pertussis NOT DETECTED NOT DETECTED Final  Chlamydophila pneumoniae NOT DETECTED NOT DETECTED Final   Mycoplasma pneumoniae NOT DETECTED NOT DETECTED Final    Comment: Performed at [facility], [place] [address] [place], [place] [address]  MRSA PCR Screening     Status: None   Collection Time: 06/05/17 12:01 AM  Result Value Ref Range Status   MRSA by PCR NEGATIVE NEGATIVE Final    Comment:        The GeneXpert MRSA Assay (FDA approved for NASAL specimens only), is one component of a comprehensive MRSA colonization surveillance program. It is not intended to diagnose MRSA infection nor to guide or monitor treatment for MRSA infections. Performed at [facility], [place] [address], [place], [place] [address]       Radiology Studies: Ct Chest Wo Contrast  Result Date: 06/06/2017 CLINICAL DATA:  Respiratory failure.  Inpatient. EXAM: CT CHEST WITHOUT CONTRAST TECHNIQUE: Multidetector CT imaging of the chest was performed following the standard protocol without IV contrast. COMPARISON:  Chest radiograph from one day prior. FINDINGS: Cardiovascular: Mild cardiomegaly, increased from prior  CT abdomen study of 01/04/2015. No significant pericardial fluid/thickening. Three-vessel coronary atherosclerosis. Two lead left subclavian pacemaker with lead tips in the right atrium and right ventricular apex. Atherosclerotic nonaneurysmal thoracic aorta. Dilated main pulmonary artery (3.5 cm diameter). Mediastinum/Nodes: No discrete thyroid nodules. Unremarkable esophagus. No axillary adenopathy. Right paratracheal adenopathy measuring up to 1.8 cm (series 3/image 46). Mild bilateral hilar adenopathy, poorly delineated on the noncontrast images. Lungs/Pleura: No pneumothorax. No pleural effusion. There is diffuse thickening of the peribronchovascular interstitium in both lungs with associated mild parenchymal distortion and mild traction bronchiolectasis. There is extensive patchy ground-glass attenuation throughout both lungs with associated mild interlobular septal thickening. No discrete lung masses or significant pulmonary nodules. Patchy consolidation is present in dependent right upper lobe, lingula and dependent right lower lobe. No frank honeycombing. Upper abdomen: Small hiatal hernia. Musculoskeletal: No aggressive appearing focal osseous lesions. Moderate thoracic spondylosis. Partially visualized bilateral lumbar spinal fusion hardware. IMPRESSION: 1. Patchy consolidation in the dependent right upper lobe, lingula and dependent right lower lobe. Extensive thickening of the peribronchovascular interstitium and extensive ground-glass attenuation throughout both lungs with associated mild traction bronchiolectasis and architectural distortion. Findings are suspected to represent ARDS complicating a multilobar pneumonia or acute interstitial pneumonia (AIP). The lungs were clear on the comparison chest radiographs dated 03/30/2014. A component of cardiogenic pulmonary edema cannot be excluded given the cardiomegaly. 2. Nonspecific mediastinal and bilateral hilar adenopathy, likely reactive. Recommend  attention on follow-up chest CT with IV contrast in 3-6 months. 3. Dilated main pulmonary artery, suggesting pulmonary arterial hypertension. 4. Three-vessel coronary atherosclerosis. 5. Small hiatal hernia. Aortic Atherosclerosis (ICD10-I70.0). Electronically Signed   By: Ilona Sorrel M.D.   On: 06/06/2017 11:26     Scheduled Meds: . atorvastatin  20 mg Oral Daily  . furosemide  40 mg Intravenous BID  . guaiFENesin  1,200 mg Oral BID  . insulin aspart  0-15 Units Subcutaneous TID WC  . insulin aspart  0-5 Units Subcutaneous QHS  . [START ON 06/08/2017] insulin glargine  12 Units Subcutaneous Daily  . levothyroxine  44 mcg Intravenous Daily  . mouth rinse  15 mL Mouth Rinse BID  . methylPREDNISolone (SOLU-MEDROL) injection  125 mg Intravenous Q6H  . metoprolol tartrate  5 mg Intravenous Q6H   Continuous Infusions: . heparin 1,150 Units/hr (06/07/17 0604)  . levofloxacin (LEVAQUIN) IV Stopped (06/06/17 1820)     LOS: 5 days   Time  Spent in minutes   45 minutes  Shreyas Piatkowski D.O. on 06/07/2017 at 2:26 PM  Between 7am to 7pm - Pager - [phone]  After 7pm go to [email or web address] - password [email or web address]  And look for the night coverage person covering for me after hours  [facility] Office  [phone]

## 2017-06-08 DIAGNOSIS — I4819 Other persistent atrial fibrillation: Secondary | ICD-10-CM

## 2017-06-08 LAB — APTT: aPTT: 83 seconds — ABNORMAL HIGH (ref 24–36)

## 2017-06-08 LAB — BASIC METABOLIC PANEL
Anion gap: 14 (ref 5–15)
BUN: 71 mg/dL — ABNORMAL HIGH (ref 6–20)
CALCIUM: 8.1 mg/dL — AB (ref 8.9–10.3)
CO2: 27 mmol/L (ref 22–32)
CREATININE: 1.72 mg/dL — AB (ref 0.44–1.00)
Chloride: 93 mmol/L — ABNORMAL LOW (ref 101–111)
GFR calc Af Amer: 32 mL/min — ABNORMAL LOW (ref >60)
GFR calc non Af Amer: 27 mL/min — ABNORMAL LOW (ref >60)
GLUCOSE: 312 mg/dL — AB (ref 65–99)
POTASSIUM: 3.5 mmol/L (ref 3.5–5.1)
SODIUM: 134 mmol/L — AB (ref 135–145)

## 2017-06-08 LAB — CBC
HCT: 32.9 % — ABNORMAL LOW (ref 36.0–46.0)
Hemoglobin: 10.7 g/dL — ABNORMAL LOW (ref 12.0–15.0)
MCH: 29.5 pg (ref 26.0–34.0)
MCHC: 32.5 g/dL (ref 30.0–36.0)
MCV: 90.6 fL (ref 78.0–100.0)
PLATELETS: 381 10*3/uL (ref 150–400)
RBC: 3.63 MIL/uL — AB (ref 3.87–5.11)
RDW: 14.9 % (ref 11.5–15.5)
WBC: 18.9 10*3/uL — ABNORMAL HIGH (ref 4.0–10.5)

## 2017-06-08 LAB — GLUCOSE, CAPILLARY
GLUCOSE-CAPILLARY: 224 mg/dL — AB (ref 65–99)
GLUCOSE-CAPILLARY: 278 mg/dL — AB (ref 65–99)
Glucose-Capillary: 332 mg/dL — ABNORMAL HIGH (ref 65–99)
Glucose-Capillary: 283 mg/dL — ABNORMAL HIGH (ref 65–99)

## 2017-06-08 LAB — MAGNESIUM: MAGNESIUM: 2 mg/dL (ref 1.7–2.4)

## 2017-06-08 LAB — HEPARIN LEVEL (UNFRACTIONATED): HEPARIN UNFRACTIONATED: 1.02 [IU]/mL — AB (ref 0.30–0.70)

## 2017-06-08 MED ORDER — METOPROLOL TARTRATE 5 MG/5ML IV SOLN
2.5000 mg | Freq: Four times a day (QID) | INTRAVENOUS | Status: DC
  Administered 2017-06-09 – 2017-06-10 (×4): 2.5 mg via INTRAVENOUS
  Filled 2017-06-08 (×5): qty 5

## 2017-06-08 MED ORDER — TRAZODONE HCL 50 MG PO TABS: 50.0000 mg | ORAL_TABLET | Freq: Every evening | ORAL | Status: DC | PRN

## 2017-06-08 MED ORDER — INSULIN GLARGINE 100 UNIT/ML ~~LOC~~ SOLN
15.0000 [IU] | Freq: Every day | SUBCUTANEOUS | Status: DC
  Administered 2017-06-08 – 2017-06-10 (×3): 15 [IU] via SUBCUTANEOUS
  Filled 2017-06-08 (×3): qty 0.15

## 2017-06-08 MED ORDER — FUROSEMIDE 10 MG/ML IJ SOLN
40.0000 mg | Freq: Every day | INTRAMUSCULAR | Status: DC
  Administered 2017-06-09: 40 mg via INTRAVENOUS
  Filled 2017-06-08: qty 4

## 2017-06-08 NOTE — Progress Notes (Signed)
ANTICOAGULATION CONSULT NOTE- [place] for Eliquis to Heparin  Indication: atrial fibrillation  Allergies  Allergen Reactions  . Morphine And Related Itching, Nausea And Vomiting, Rash and Other (See Comments)    01/04/15- patient says she is NOT allergic to morphine.  Morphine IV given this AM 01/04/15 in the ED. No adverse reaction.  Celesta Gentile [Sitagliptin Phosphate] Other (See Comments)    Pt says this caused elevated liver enzymes and creatinine. IS TAKING 1/2 TABLET  . Actos [Pioglitazone Hydrochloride] Swelling    UNSPECIFIED REACTION   . Hydrocodone-Acetaminophen Itching, Nausea And Vomiting and Other (See Comments)    Per pt 05/12/15 she had this recently in the hospital with no issues at all  . Keflex [Cephalexin] Rash and Other (See Comments)    Burning sensation all over  . Percocet [Oxycodone-Acetaminophen] Nausea And Vomiting   Patient Measurements: Height: 5\' 4"  (162.6 cm) Weight: 154 lb 8.7 oz (70.1 kg) IBW/kg (Calculated) : 54.7  Vital Signs: Temp: 97.6 F (36.4 C) (02/24 0741) Temp Source: Axillary (02/24 0741) BP: 139/64 (02/24 0741) Pulse Rate: 81 (02/24 0741)  Labs: Recent Labs    06/06/17 0520 06/07/17 0323 06/08/17 0440  HGB 9.8* 10.5* 10.7*  HCT 31.4* 32.8* 32.9*  PLT 336 346 381  APTT 78* 66* 83*  HEPARINUNFRC >2.20* 2.10* 1.02*  CREATININE 1.60* 1.72* 1.72*    Estimated Creatinine Clearance: 26.3 mL/min (A) (by C-G formula based on SCr of 1.72 mg/dL (H)).  Assessment: 77 year old female on Eliquis PTA for Afib, last dose 2/20 at 10 pm, continues on heparin for Afib. Heparin level and aPTT are still not correlating, using aPTTs to dose for now.  Doppler/VQ scan > negative for DVT   PTT within range: 83 sec No bleeding noted  Goal of Therapy:  aPTT 66-102 seconds Monitor platelets by anticoagulation protocol: Yes  Heparin level = 0.3-0.7   Plan:  Continue heparin at 1150 units / hr Daily heparin level, PTT,  CBC   Madgie Dhaliwal L. Kyung Rudd, PharmD, [place] PGY1 Pharmacy Resident Pager: [phone]

## 2017-06-08 NOTE — Progress Notes (Signed)
Progress Note  Patient Name: Becky Forbes Date of Encounter: 06/08/2017  Primary Cardiologist: Dr. Mertie Moores  Subjective   Breathing somewhat more easily, off BiPAP and using nasal cannula at this time.  No chest pain or palpitations.  Inpatient Medications    Scheduled Meds: . atorvastatin  20 mg Oral Daily  . furosemide  40 mg Intravenous BID  . guaiFENesin  1,200 mg Oral BID  . insulin aspart  0-15 Units Subcutaneous TID WC  . insulin aspart  0-5 Units Subcutaneous QHS  . insulin glargine  15 Units Subcutaneous Daily  . levothyroxine  44 mcg Intravenous Daily  . mouth rinse  15 mL Mouth Rinse BID  . methylPREDNISolone (SOLU-MEDROL) injection  125 mg Intravenous Q6H  . metoprolol tartrate  5 mg Intravenous Q6H   Continuous Infusions: . heparin 1,150 Units/hr (06/08/17 0046)   PRN Meds: acetaminophen, ipratropium, levalbuterol, ondansetron **OR** ondansetron (ZOFRAN) IV, polyethylene glycol, traZODone   Vital Signs    Vitals:   06/08/17 0741 06/08/17 0758 06/08/17 1013 06/08/17 1153  BP: 139/64   113/63  Pulse: 81   83  Resp: 19   18  Temp: 97.6 F (36.4 C)   (!) 97.5 F (36.4 C)  TempSrc: Axillary   Oral  SpO2: 99% 97% (!) 89% (!) 89%  Weight:      Height:        Intake/Output Summary (Last 24 hours) at 06/08/2017 1208 Last data filed at 06/08/2017 0319 Gross per 24 hour  Intake 240 ml  Output 950 ml  Net -710 ml   Filed Weights   06/06/17 0402 06/07/17 0336 06/08/17 0317  Weight: 157 lb 10.1 oz (71.5 kg) 156 lb 12 oz (71.1 kg) 154 lb 8.7 oz (70.1 kg)    Telemetry    Rate controlled atrial fibrillation.  Personally reviewed.  Physical Exam   GEN:  Elderly woman, able to speak in full sentences. Neck: No JVD. Cardiac:  Irregularly irregular, no gallop.  Respiratory:  Coarse breath sounds with scattered crackles and rhonchi. GI: Soft, nontender, bowel sounds present. MS: No edema; No deformity. Neuro:  Nonfocal. Psych: Alert and oriented x  3. Normal affect.  Labs    Chemistry Recent Labs  Lab 06/04/17 0700  06/06/17 0520 06/07/17 0323 06/08/17 0440  NA 133*   < > 140 137 134*  K 3.7   < > 3.8 4.1 3.5  CL 96*   < > 99* 94* 93*  CO2 22   < > 27 30 27   GLUCOSE 154*   < > 156* 244* 312*  BUN 33*   < > 41* 56* 71*  CREATININE 1.95*   < > 1.60* 1.72* 1.72*  CALCIUM 8.2*   < > 8.7* 8.7* 8.1*  PROT 7.4  --   --   --   --   ALBUMIN 2.4*  --   --   --   --   AST 25  --   --   --   --   ALT 15  --   --   --   --   ALKPHOS 112  --   --   --   --   BILITOT 1.9*  --   --   --   --   GFRNONAA 24*   < > 30* 27* 27*  GFRAA 27*   < > 35* 32* 32*  ANIONGAP 15   < > 14 13 14    < > = values  in this interval not displayed.     Hematology Recent Labs  Lab 06/06/17 0520 06/07/17 0323 06/08/17 0440  WBC 12.3* 11.8* 18.9*  RBC 3.36* 3.55* 3.63*  HGB 9.8* 10.5* 10.7*  HCT 31.4* 32.8* 32.9*  MCV 93.5 92.4 90.6  MCH 29.2 29.6 29.5  MCHC 31.2 32.0 32.5  RDW 15.3 15.0 14.9  PLT 336 346 381    Cardiac EnzymesNo results for input(s): TROPONINI in the last 168 hours.  Recent Labs  Lab 06/02/17 1146  TROPIPOC 0.04     BNP Recent Labs  Lab 06/02/17 1123  BNP 2,340.8*     DDimer  Recent Labs  Lab 06/05/17 0730  DDIMER 2.43*     Radiology    No results found.  Cardiac Studies   Echocardiogram 06/03/2017: Study Conclusions  - Left ventricle: The cavity size was mildly dilated. Systolic   function was moderately to severely reduced. The estimated   ejection fraction was in the range of 30% to 35%. Diffuse   hypokinesis. - Aortic valve: Noncoronary cusp mobility was restricted.   Transvalvular velocity was within the normal range. There was no   stenosis. There was no regurgitation. - Mitral valve: Mildly calcified annulus. Transvalvular velocity   was within the normal range. There was no evidence for stenosis.   There was mild to moderate regurgitation. - Left atrium: The atrium was severely dilated. -  Right ventricle: The cavity size was normal. Wall thickness was   normal. Systolic function was normal. - Right atrium: The atrium was severely dilated. - Atrial septum: No defect or patent foramen ovale was identified   by color flow Doppler. - Tricuspid valve: There was mild regurgitation. - Pulmonary arteries: Systolic pressure was within the normal   range. PA peak pressure: 27 mm Hg (S).  Impressions:  - Compared with the echo 41/74/08, systolic function is reduced.  Patient Profile     77 y.o. female with persistent atrial fibrillation, nonischemic cardiomyopathy, sick sinus syndrome status post pacemaker, previous GI bleed, and CKD stage 3.  She is being managed for hypoxic respiratory failure with parenchymal lung disease/groundglass opacities, question of toxicity related to amiodarone or bromocriptine, both of which have been discontinued.  She is being followed by the Pulmonary service.  Assessment & Plan    1.  Persistent atrial fibrillation.  She continues on IV heparin and IV pressor, recently requiring fairly consistent use of BiPAP and not able to take oral medications.  Heart rate is adequately controlled at this time.  She was previously on Eliquis and Lopressor.  As noted above, amiodarone has been discontinued.  2.  Nonischemic cardiomyopathy with LVEF 30-35% range by follow-up echocardiogram.  With concurrent hypoxic respiratory failure she has also been managed for component of acute on chronic systolic heart failure.  She continues on IV Lasix with reasonable diuresis during hospital stay.  Weight down another 2 pounds.  3.  CKD stage III, creatinine stable at 1.7.  4.  Hypoxic respiratory failure, now on nasal cannula with plan intermittent use of BiPAP and management per Pulmonary service.  Possible toxicity related to amiodarone or bromocriptine suspected.  She is on steroids.  Continue current cardiac regimen.  When she is able to consistently take oral  medications, consider switching back to oral Lopressor and ultimately Eliquis instead of heparin.  Continue IV Lasix.  Signed, Rozann Lesches, MD  06/08/2017, 12:08 PM

## 2017-06-08 NOTE — Progress Notes (Addendum)
Name: Becky Forbes MRN: [id number] DOB: [birth year]    ADMISSION DATE:  06/02/2017 CONSULTATION DATE:  06/06/17  REFERRING MD :  Dr. 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    The patient reports she has had decreased activity tolerance since January 2019 and shortness of breath.  She reports she thinks she has had some weight loss.  She denies swelling in her lower extremities or pain.  She reports chest pressure at times.  She denies fevers, chills, nausea, vomiting, choking / difficulty swallowing, n/v/d, viral illness / sick contacts.  She went to [place] to shop with her sister the weekend before admit and after "just crashed".    She has been married for 45 years.  Her husband used to smoke but quit 40+ years ago.  She has had no known occupational exposures, wood heat, mold, birds / Curator.  The patient reports she has been on Amiodarone for approximately 3 years but was recently decreased to 3 times weekly (followed by Dr. Cathie Olden).      Since admit, she has been on BiPAP / increased O2 since admit.  Work up for PE is pending.  Despite high O2 needs her work of breathing has been normal.  Cardiology was consulted and she was diuresed 3.8L since admit.  ECHO revealed new reduction of LVEF to 30-35%.  She remains in AF with rate of 100-120's.  CT of the chest without contrast assessed which demonstrated diffuse bilateral ground glass.     PCCM consulted for pulmonary evaluation.    SUBJECTIVE:  Pt denies increased work of breathing.  Feels the steroids are keeping her up at night.  O2 decreased to 40L/60% on HFNC.    VITAL SIGNS: Temp:  [97.5 F (36.4 C)-98.2 F (36.8 C)] 97.6 F (36.4 C) (02/24 0741) Pulse Rate:  [81-97] 81 (02/24 0741) Resp:  [15-20] 19 (02/24 0741) BP: (106-139)/(53-69) 139/64 (02/24 0741) SpO2:  [89 %-99 %] 97 % (02/24  0758) FiO2 (%):  [60 %] 60 % (02/24 0758) Weight:  [154 lb 8.7 oz (70.1 kg)] 154 lb 8.7 oz (70.1 kg) (02/24 0317)  PHYSICAL EXAMINATION: General: elderly female in NAD lying in bed HEENT: MM pink/moist PSY: calm/appropriate  Neuro: AAOx4, speech clear, MAE  CV: s1s2 rrr, no m/r/g PULM: even/non-labored, lungs bilaterally with lateral / posterior crackles (dry) ZT:IWPY, non-tender, bsx4 active  Extremities: warm/dry, no edema  Skin: no rashes or lesions   Recent Labs  Lab 06/06/17 0520 06/07/17 0323 06/08/17 0440  NA 140 137 134*  K 3.8 4.1 3.5  CL 99* 94* 93*  CO2 _0 BUN 41* 56* 71*  CREATININE 1.60* 1.72* 1.72*  GLUCOSE 156* 244* 312*    Recent Labs  Lab 06/06/17 0520 06/07/17 0323 06/08/17 0440  HGB 9.8* 10.5* 10.7*  HCT 31.4* 32.8* 32.9*  WBC 12.3* 11.8* 18.9*  PLT 336 346 381    Ct Chest Wo Contrast  Result Date: 06/06/2017 CLINICAL DATA:  Respiratory failure.  Inpatient. EXAM: CT CHEST WITHOUT CONTRAST TECHNIQUE: Multidetector CT imaging of the chest was performed following the standard protocol without IV contrast. COMPARISON:  Chest radiograph from one day prior. FINDINGS: Cardiovascular: Mild cardiomegaly, increased from prior CT abdomen study of 01/04/2015. No significant pericardial fluid/thickening. Three-vessel coronary atherosclerosis. Two lead left subclavian pacemaker with lead tips in the  right atrium and right ventricular apex. Atherosclerotic nonaneurysmal thoracic aorta. Dilated main pulmonary artery (3.5 cm diameter). Mediastinum/Nodes: No discrete thyroid nodules. Unremarkable esophagus. No axillary adenopathy. Right paratracheal adenopathy measuring up to 1.8 cm (series 3/image 46). Mild bilateral hilar adenopathy, poorly delineated on the noncontrast images. Lungs/Pleura: No pneumothorax. No pleural effusion. There is diffuse thickening of the peribronchovascular interstitium in both lungs with associated mild parenchymal distortion and mild  traction bronchiolectasis. There is extensive patchy ground-glass attenuation throughout both lungs with associated mild interlobular septal thickening. No discrete lung masses or significant pulmonary nodules. Patchy consolidation is present in dependent right upper lobe, lingula and dependent right lower lobe. No frank honeycombing. Upper abdomen: Small hiatal hernia. Musculoskeletal: No aggressive appearing focal osseous lesions. Moderate thoracic spondylosis. Partially visualized bilateral lumbar spinal fusion hardware. IMPRESSION: 1. Patchy consolidation in the dependent right upper lobe, lingula and dependent right lower lobe. Extensive thickening of the peribronchovascular interstitium and extensive ground-glass attenuation throughout both lungs with associated mild traction bronchiolectasis and architectural distortion. Findings are suspected to represent ARDS complicating a multilobar pneumonia or acute interstitial pneumonia (AIP). The lungs were clear on the comparison chest radiographs dated 03/30/2014. A component of cardiogenic pulmonary edema cannot be excluded given the cardiomegaly. 2. Nonspecific mediastinal and bilateral hilar adenopathy, likely reactive. Recommend attention on follow-up chest CT with IV contrast in 3-6 months. 3. Dilated main pulmonary artery, suggesting pulmonary arterial hypertension. 4. Three-vessel coronary atherosclerosis. 5. Small hiatal hernia. Aortic Atherosclerosis (ICD10-I70.0). Electronically Signed   By: Ilona Sorrel M.D.   On: 06/06/2017 11:26      SIGNIFICANT EVENTS  2/19  Admit  2/22  PCCM consulted  2/23  45L/60% HFNC  2/24  40L/60% HFNC    STUDIES 2/22 CT Chest w/o >> Patchy consolidation in the dependent right upper lobe, lingula and dependent right lower lobe. Extensive thickening of the peribronchovascular interstitium and extensive ground-glass attenuation throughout both lungs with associated mild traction bronchiolectasis and architectural  distortion. Nonspecific mediastinal and bilateral hilar adenopathy, likely reactive.  Dilated main pulmonary artery, suggesting pulmonary arterialHypertension. Three-vessel coronary atherosclerosis.  Small hiatal hernia. 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  She has a hx of chronic combined CHF and has been diuresed 3.8L and is clinically euvolemic.  She does not have any known history of autoimmune disease. In 2016 she had a CT of the Abd which showed her lower lungs that appeared normal.  She has been on amiodarone for AF the last 3 years.  Symptoms began with slow SOB in January 2019 and mostly non-productive cough.  DDx includes amiodarone lung toxicity (she is also on bromocriptine which can be associated with pulmonary fibrosis), autoimmune pulmonary manifestation, cardiogenic pulmonary edema & hypersensitivity pneumonitis.   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  Acute Hypoxic Respiratory Failure - doubt PE given CT appearance and symptom onset  Hx Amiodarone Use  Insomnia on Steroids / At Risk Delirium   Plan: Await autoimmune work up  Continue solumedrol 125 mg IV Q6 > consider 72 hours high dose  vs significant weaning of O2 to guide reduction of steroids  Wean O2 for sats > 88%, will tolerate 10-20 second drops to 85% PRN bipap for increased WOB  Oral care for dryness  Hold amiodarone / bromocriptine indefinitely  Heparin gtt per Pharmacy / Cardiology LE duplex negative for DVT Adjusted lantus to 15 units QD with SSI  QHS PRN trazodone   Noe Gens, NP-C [facility] Pgr: [phone] or if no answer [phone] 06/08/2017, 9:06 AM

## 2017-06-08 NOTE — Progress Notes (Signed)
Pt with persistent spO2 in low 80s, placed back on Bipap for rest and recruitment.  SpO2 95% on 60% at this time.

## 2017-06-08 NOTE — Progress Notes (Signed)
Discussed pt with Pulmonary CC attending.  May tolerate SpO2 85%.  Titrate flow and FIO2 to maintain.  Able to wean Flow at this time, if pt maintains , will continue to wean FIO2.

## 2017-06-08 NOTE — Progress Notes (Signed)
PROGRESS NOTE    Becky Forbes  VEL:[id number] DOB: 02-Jul-1940 DOA: 06/02/2017 PCP: Binnie Rail, MD   Chief Complaint  Patient presents with  . Shortness of Breath  . Atrial Fibrillation  . Chest Pain    Brief Narrative:  HPI on 06/02/2017 by Ms. [address] COLLIER Becky is a 77 y.o. female with medical history significant of sick sinus syndrome with implanted pacemaker, paroxysmal atrial fibrillation, hypothyroidism, combined diastolic and systolic congestive heart failure, chronic kidney disease, hypothyroidism, diabetes mellitus who presented to the emergency department with complaints of worsening shortness of breath, cough productive of white sputum and chest tightness.  Patient reported that since December she was battling walled with cough and shortness of breath off and on and on Friday he was traveling to [place] to see his sister, had a lot of walking around and on the way back her daughter noticed that patient developed frequent coughs and some shortness of breath.  Since Friday her condition worsened and eventually this night she could not sleep at all and had to come to the ED for evaluation. She denied PND, orthopnea, chest pain, diaphoresis, lower extremity swelling, abdominal pain, she complained of nausea and dry heaves, but no vomiting.   Interim history Admitted for acute respiratory failure with hypoxia.  Patient found to have a reduced EF, cardiology consulted and appreciated. Continues to be hypoxic and unable to wean off of bipap. Obtained CT chest w/o contrast which showed ARDS. PCCM consulted.  Assessment & Plan   Acute respiratory failure with hypoxia  -Suspect multifactorial including systolic CHF exacerbation, atrial fibrillation versus flutter, suspected pneumonia -Currently requiring BiPAP and unable to wean  -Continue nebulizer treatments -currently NPO -DDimer 2.43 -Have ordered a VQ scan however this cannot be done as patient is currently on  BiPAP. Lower extremity doppler negative for DVT -Patient was recently placed on Eliquis however may have had blood clot prior to starting this medication -Ordered CT chest w/o contrast: suspected ARDS complicating a multilobar pneumonia or acute interstitial pneumonia.  -PCCM consulted and appreciated- recommended high dose steroids, discontinuation of amiodarone and bromocriptine planning to; wean steroids after 72 hours -Autoimmune workup pending  Acute on chronic systolic CHF exacerbation -Echocardiogram showed EF of 30-35% (echocardiogram December 2015 showed an EF of 50-55%) -BNP on admission 2340.8 -Placed on IV Lasix 40 mg twice daily, will decrease to daily -Monitor intake and output, daily weights  -cardiology consulted and appreciated -urine output over past 24 hours 950cc  Sepsis secondary to Pneumonia -Chest x-ray on admission showed new bilateral pulmonary infiltrates- possible pneumonia. -upon admission, patient with leukocytosis, tachycardia and elevated lactic acid level -Continue Levaquin -Blood culture shows no growth to date -Urine strep pneumonia antigen negative -Repeat chest x-ray today showed stable right upper and left lower lobe infiltrates -CT scan noted above  Paroxysmal atrial fibrillation/atrial flutter -Patient was in normal sinus rhythm at previous cardiology visit per outpatient notes -CHADSVASC 6 -TSH 0.405 -Patient placed on Eliquis-transition to heparin as patient currently on BiPAP and is NPO -Amiodarone discontinued due to respiratory symptoms -Cardiology consulted and appreciated  Acute kidney injury on chronic kidney disease, stage III -Creatinine currently 1.72, baseline  approximately 1.3-1.4  Essential hypertension -Ramipril held -Continue metoprolol-will change to IV form 5 mg every 6 hours with holding parameters  Diabetes mellitus, type II -Home Januvia and repaglinide held -Continue insulin sliding scale CBG monitoring -lantus  added as patient is currently on high dose steroids -Hemoglobin A1c 7.7  Hypomagnesemia  -replaced -Continue to monitor and replace as needed  Hypothyroidism -TSH 0.405 -Continue IV synthroid   Hypokalemia -Secondary to diuresis -replaced, continue to monitor BMP  Hyperlipidemia -Continue statin  History of GI bleed -Secondary to ischemic colitis, patient was on Coumadin at that time -Case was briefly discussed with cardiologist, Dr. Acie Fredrickson, who recommended anticoagulation given her high CHADSVASC score -Closely monitor for signs and symptoms of bleeding -Continue to monitor CBC-hemoglobin appears to be stable, currently 10.7  Sick sinus syndrome -Has dual-chamber pacemaker, and currently on telemetry  Nausea -Continue Zofran as needed  DVT Prophylaxis  Heparin   Code Status: Full  Family Communication: Family at bedside  Disposition Plan:  Remains admitted due to respiratory failure and need for BIPAP   Consultants Cardiology PCCM  Procedures  Echocardiogram Lower extremity doppler  Antibiotics   Anti-infectives (From admission, onward)   Start     Dose/Rate Route Frequency Ordered Stop   06/03/17 1400  levofloxacin (LEVAQUIN) IVPB 500 mg     500 mg 100 mL/hr over 60 Minutes Intravenous Every 24 hours 06/02/17 1329 06/07/17 1455   06/02/17 1230  levofloxacin (LEVAQUIN) IVPB 500 mg     500 mg 100 mL/hr over 60 Minutes Intravenous  Once 06/02/17 1158 06/02/17 1543      Subjective:   [name] seen and examined today.  Patient currently on high flow nasal cannula.  States she is feeling a little better however continues to feel very weak.  Denies current chest pain, abdominal pain, nausea vomiting, diarrhea constipation.  Objective:   Vitals:   06/08/17 0741 06/08/17 0758 06/08/17 1013 06/08/17 1153  BP: 139/64   113/63  Pulse: 81   83  Resp: 19   18  Temp: 97.6 F (36.4 C)   (!) 97.5 F (36.4 C)  TempSrc: Axillary   Oral  SpO2: 99% 97% (!) 89%  (!) 89%  Weight:      Height:        Intake/Output Summary (Last 24 hours) at 06/08/2017 1211 Last data filed at 06/08/2017 0319 Gross per 24 hour  Intake 240 ml  Output 950 ml  Net -710 ml   Filed Weights   06/06/17 0402 06/07/17 0336 06/08/17 0317  Weight: 71.5 kg (157 lb 10.1 oz) 71.1 kg (156 lb 12 oz) 70.1 kg (154 lb 8.7 oz)   Exam  General: Well developed, well nourished, NAD, appears stated age  HEENT: NCAT, mucous membranes moist.   Neck: Supple  Cardiovascular: S1 S2 auscultated, no rubs, murmurs or gallops. Regular rate and rhythm.  Respiratory: Clear to auscultation bilaterally anteriorly, increased work of breathing  Abdomen: Soft, nontender, nondistended, + bowel sounds  Extremities: warm dry without cyanosis clubbing or edema  Neuro: AAOx3,  nonfocal  Skin: Without rashes exudates or nodules  Psych: appropriate mood and affect, pleasant  Data Reviewed: I have personally reviewed following labs and imaging studies  CBC: Recent Labs  Lab 06/03/17 0453 06/04/17 0700 06/05/17 0438 06/06/17 0520 06/07/17 0323 06/08/17 0440  WBC 11.8* 15.7* 15.2* 12.3* 11.8* 18.9*  NEUTROABS 10.1* 14.0*  --   --   --   --   HGB 10.1* 9.6* 9.4* 9.8* 10.5* 10.7*  HCT 31.2* 29.7* 29.2* 31.4* 32.8* 32.9*  MCV 92.3 92.5 91.3 93.5 92.4 90.6  PLT 246 319 308 336 346 782   Basic Metabolic Panel: Recent Labs  Lab 06/04/17 0700 06/05/17 0438 06/06/17 0520 06/07/17 0323 06/08/17 0440  NA 133* 134* 140 137 134*  K 3.7 3.3* 3.8 4.1 3.5  CL 96* 97* 99* 94* 93*  CO2 22 24 27 30 27   GLUCOSE 154* 150* 156* 244* 312*  BUN 33* 36* 41* 56* 71*  CREATININE 1.95* 1.80* 1.60* 1.72* 1.72*  CALCIUM 8.2* 8.1* 8.7* 8.7* 8.1*  MG 1.5* 1.8 2.1  --  2.0  PHOS 3.2  --   --   --   --    GFR: Estimated Creatinine Clearance: 26.3 mL/min (A) (by C-G formula based on SCr of 1.72 mg/dL (H)). Liver Function Tests: Recent Labs  Lab 06/04/17 0700  AST 25  ALT 15  ALKPHOS 112  BILITOT  1.9*  PROT 7.4  ALBUMIN 2.4*   No results for input(s): LIPASE, AMYLASE in the last 168 hours. No results for input(s): AMMONIA in the last 168 hours. Coagulation Profile: No results for input(s): INR, PROTIME in the last 168 hours. Cardiac Enzymes: No results for input(s): CKTOTAL, CKMB, CKMBINDEX, TROPONINI in the last 168 hours. BNP (last 3 results) No results for input(s): PROBNP in the last 8760 hours. HbA1C: No results for input(s): HGBA1C in the last 72 hours. CBG: Recent Labs  Lab 06/07/17 1146 06/07/17 1701 06/07/17 2059 06/08/17 0739 06/08/17 1152  GLUCAP 270* 205* 298* 332* 283*   Lipid Profile: No results for input(s): CHOL, HDL, LDLCALC, TRIG, CHOLHDL, LDLDIRECT in the last 72 hours. Thyroid Function Tests: No results for input(s): TSH, T4TOTAL, FREET4, T3FREE, THYROIDAB in the last 72 hours. Anemia Panel: No results for input(s): VITAMINB12, FOLATE, FERRITIN, TIBC, IRON, RETICCTPCT in the last 72 hours. Urine analysis:    Component Value Date/Time   COLORURINE YELLOW 01/04/2015 1620   APPEARANCEUR CLEAR 01/04/2015 1620   LABSPEC 1.017 01/04/2015 1620   PHURINE 5.0 01/04/2015 1620   GLUCOSEU NEGATIVE 01/04/2015 1620   HGBUR TRACE (A) 01/04/2015 1620   BILIRUBINUR NEGATIVE 01/04/2015 1620   BILIRUBINUR negative 08/05/2012 1435   KETONESUR NEGATIVE 01/04/2015 1620   PROTEINUR NEGATIVE 01/04/2015 1620   UROBILINOGEN 0.2 01/04/2015 1620   NITRITE NEGATIVE 01/04/2015 1620   LEUKOCYTESUR TRACE (A) 01/04/2015 1620   Sepsis Labs: @LABRCNTIP (procalcitonin:4,lacticidven:4)  ) Recent Results (from the past 240 hour(s))  Culture, blood (routine x 2)     Status: None   Collection Time: 06/02/17 11:31 AM  Result Value Ref Range Status   Specimen Description BLOOD RIGHT ANTECUBITAL  Final   Special Requests   Final    BOTTLES DRAWN AEROBIC AND ANAEROBIC Blood Culture adequate volume   Culture   Final    NO GROWTH 5 DAYS Performed at [facility],  [place] [address], [place], [place] [address]    Report Status 06/07/2017 FINAL  Final  Respiratory Panel by PCR     Status: None   Collection Time: 06/03/17 10:49 AM  Result Value Ref Range Status   Adenovirus NOT DETECTED NOT DETECTED Final   Coronavirus 229E NOT DETECTED NOT DETECTED Final   Coronavirus HKU1 NOT DETECTED NOT DETECTED Final   Coronavirus NL63 NOT DETECTED NOT DETECTED Final   Coronavirus OC43 NOT DETECTED NOT DETECTED Final   Metapneumovirus NOT DETECTED NOT DETECTED Final   Rhinovirus / Enterovirus NOT DETECTED NOT DETECTED Final   Influenza A NOT DETECTED NOT DETECTED Final   Influenza B NOT DETECTED NOT DETECTED Final   Parainfluenza Virus 1 NOT DETECTED NOT DETECTED Final   Parainfluenza Virus 2 NOT DETECTED NOT DETECTED Final   Parainfluenza Virus 3 NOT DETECTED NOT DETECTED Final   Parainfluenza Virus 4  NOT DETECTED NOT DETECTED Final   Respiratory Syncytial Virus NOT DETECTED NOT DETECTED Final   Bordetella pertussis NOT DETECTED NOT DETECTED Final   Chlamydophila pneumoniae NOT DETECTED NOT DETECTED Final   Mycoplasma pneumoniae NOT DETECTED NOT DETECTED Final    Comment: Performed at [facility], [place] [address], [place], [place] [address]  MRSA PCR Screening     Status: None   Collection Time: 06/05/17 12:01 AM  Result Value Ref Range Status   MRSA by PCR NEGATIVE NEGATIVE Final    Comment:        The GeneXpert MRSA Assay (FDA approved for NASAL specimens only), is one component of a comprehensive MRSA colonization surveillance program. It is not intended to diagnose MRSA infection nor to guide or monitor treatment for MRSA infections. Performed at [facility], [place] [address] [place], [place] [address]       Radiology Studies: No results found.   Scheduled Meds: . atorvastatin  20 mg Oral Daily  . furosemide  40 mg Intravenous BID  . guaiFENesin  1,200 mg Oral BID  . insulin aspart  0-15 Units Subcutaneous TID WC  . insulin aspart   0-5 Units Subcutaneous QHS  . insulin glargine  15 Units Subcutaneous Daily  . levothyroxine  44 mcg Intravenous Daily  . mouth rinse  15 mL Mouth Rinse BID  . methylPREDNISolone (SOLU-MEDROL) injection  125 mg Intravenous Q6H  . metoprolol tartrate  5 mg Intravenous Q6H   Continuous Infusions: . heparin 1,150 Units/hr (06/08/17 0046)     LOS: 6 days   Time Spent in minutes   30 minutes  Toma Erichsen D.O. on 06/08/2017 at 12:11 PM  Between 7am to 7pm - Pager - [phone]  After 7pm go to [email or web address] - password [email or web address]  And look for the night coverage person covering for me after hours  [facility] Office  [phone]

## 2017-06-09 ENCOUNTER — Inpatient Hospital Stay (HOSPITAL_COMMUNITY): Payer: Medicare Other

## 2017-06-09 DIAGNOSIS — T462X1A Poisoning by other antidysrhythmic drugs, accidental (unintentional), initial encounter: Secondary | ICD-10-CM

## 2017-06-09 LAB — APTT: aPTT: 95 seconds — ABNORMAL HIGH (ref 24–36)

## 2017-06-09 LAB — GLUCOSE, CAPILLARY
GLUCOSE-CAPILLARY: 272 mg/dL — AB (ref 65–99)
Glucose-Capillary: 324 mg/dL — ABNORMAL HIGH (ref 65–99)
Glucose-Capillary: 339 mg/dL — ABNORMAL HIGH (ref 65–99)
Glucose-Capillary: 334 mg/dL — ABNORMAL HIGH (ref 65–99)

## 2017-06-09 LAB — CBC
HEMATOCRIT: 32.8 % — AB (ref 36.0–46.0)
Hemoglobin: 10.6 g/dL — ABNORMAL LOW (ref 12.0–15.0)
MCH: 29.2 pg (ref 26.0–34.0)
MCHC: 32.3 g/dL (ref 30.0–36.0)
MCV: 90.4 fL (ref 78.0–100.0)
PLATELETS: 337 10*3/uL (ref 150–400)
RBC: 3.63 MIL/uL — ABNORMAL LOW (ref 3.87–5.11)
RDW: 14.9 % (ref 11.5–15.5)
WBC: 17 10*3/uL — ABNORMAL HIGH (ref 4.0–10.5)

## 2017-06-09 LAB — HEPARIN LEVEL (UNFRACTIONATED): Heparin Unfractionated: 0.87 IU/mL — ABNORMAL HIGH (ref 0.30–0.70)

## 2017-06-09 LAB — BASIC METABOLIC PANEL
ANION GAP: 14 (ref 5–15)
BUN: 74 mg/dL — AB (ref 6–20)
CALCIUM: 8.2 mg/dL — AB (ref 8.9–10.3)
CO2: 28 mmol/L (ref 22–32)
Chloride: 95 mmol/L — ABNORMAL LOW (ref 101–111)
Creatinine, Ser: 1.57 mg/dL — ABNORMAL HIGH (ref 0.44–1.00)
GFR calc Af Amer: 36 mL/min — ABNORMAL LOW (ref >60)
GFR, EST NON AFRICAN AMERICAN: 31 mL/min — AB (ref >60)
GLUCOSE: 333 mg/dL — AB (ref 65–99)
Potassium: 3.3 mmol/L — ABNORMAL LOW (ref 3.5–5.1)
Sodium: 137 mmol/L (ref 135–145)

## 2017-06-09 LAB — ANTINUCLEAR ANTIBODIES, IFA: ANTINUCLEAR ANTIBODIES, IFA: NEGATIVE

## 2017-06-09 MED ORDER — POTASSIUM CHLORIDE CRYS ER 20 MEQ PO TBCR
40.0000 meq | EXTENDED_RELEASE_TABLET | Freq: Once | ORAL | Status: AC
  Administered 2017-06-09: 40 meq via ORAL
  Filled 2017-06-09: qty 2

## 2017-06-09 MED ORDER — FUROSEMIDE 10 MG/ML IJ SOLN
40.0000 mg | Freq: Two times a day (BID) | INTRAMUSCULAR | Status: DC
  Administered 2017-06-09 – 2017-06-11 (×4): 40 mg via INTRAVENOUS
  Filled 2017-06-09 (×4): qty 4

## 2017-06-09 MED ORDER — SALINE SPRAY 0.65 % NA SOLN
1.0000 | NASAL | Status: DC | PRN
  Filled 2017-06-09: qty 44

## 2017-06-09 MED ORDER — LEVOFLOXACIN IN D5W 500 MG/100ML IV SOLN
500.0000 mg | INTRAVENOUS | Status: DC
  Administered 2017-06-09 – 2017-06-11 (×3): 500 mg via INTRAVENOUS
  Filled 2017-06-09 (×3): qty 100

## 2017-06-09 MED ORDER — POTASSIUM CHLORIDE 10 MEQ/100ML IV SOLN: 10.0000 meq | INTRAVENOUS | Status: DC

## 2017-06-09 NOTE — Care Management Important Message (Signed)
Important Message  Patient Details  Name: Becky Forbes MRN: [id number] Date of Birth: 1941-04-07   Medicare Important Message Given:  Yes    Delvon Chipps 06/09/2017, 9:00 AM

## 2017-06-09 NOTE — Progress Notes (Signed)
Inpatient Diabetes Program Recommendations  AACE/ADA: New Consensus Statement on Inpatient Glycemic Control (2015)  Target Ranges:  Prepandial:   less than 140 mg/dL      Peak postprandial:   less than 180 mg/dL (1-2 hours)      Critically ill patients:  140 - 180 mg/dL   Lab Results  Component Value Date   GLUCAP 272 (H) 06/09/2017   HGBA1C 7.7 (H) 06/04/2017    Review of Glycemic Control  Post-prandials elevated. On Solumedrol 125 mg Q6H. FBS 333, 324   Inpatient Diabetes Program Recommendations:     Increase Lantus to 18 units QAM Add Novolog 4 units tidwc for meal coverage insulin if eating > 50% meal  Will continue to follow.  Thank you. Lorenda Peck, RD, LDN, CDE Inpatient Diabetes Coordinator [phone]

## 2017-06-09 NOTE — Progress Notes (Addendum)
Name: Becky Forbes MRN: [id number] DOB: [birth year]    ADMISSION DATE:  06/02/2017 CONSULTATION DATE:  06/06/17  REFERRING MD :  Dr. 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    The patient reports she has had decreased activity tolerance since January 2019 and shortness of breath.  She reports she thinks she has had some weight loss.  She denies swelling in her lower extremities or pain.  She reports chest pressure at times.  She denies fevers, chills, nausea, vomiting, choking / difficulty swallowing, n/v/d, viral illness / sick contacts.  She went to [place] to shop with her sister the weekend before admit and after "just crashed".    She has been married for 90 years.  Her husband used to smoke but quit 40+ years ago.  She has had no known occupational exposures, wood heat, mold, birds / Curator.  The patient reports she has been on Amiodarone for approximately 3 years but was recently decreased to 3 times weekly (followed by Dr. Cathie Olden).      Since admit, she has been on BiPAP / increased O2 since admit.  Work up for PE is pending.  Despite high O2 needs her work of breathing has been normal.  Cardiology was consulted and she was diuresed 3.8L since admit.  ECHO revealed new reduction of LVEF to 30-35%.  She remains in AF with rate of 100-120's.  CT of the chest without contrast assessed which demonstrated diffuse bilateral ground glass.     PCCM consulted for pulmonary evaluation.    SUBJECTIVE:   Feels a little better  VITAL SIGNS: Temp:  [97.8 F (36.6 C)-98.4 F (36.9 C)] 98.4 F (36.9 C) (02/25 1216) Pulse Rate:  [79-95] 86 (02/25 1216) Resp:  [11-26] 17 (02/25 1216) BP: (111-126)/(62-83) 118/82 (02/25 1216) SpO2:  [86 %-98 %] 90 % (02/25 1216) FiO2 (%):  [45 %-70 %] 45 % (02/25 1216) Weight:  [157 lb 3 oz (71.3 kg)] 157 lb 3 oz (71.3 kg)  (02/25 0357)  Intake/Output Summary (Last 24 hours) at 06/09/2017 1411 Last data filed at 06/09/2017 9741 Gross per 24 hour  Intake 138 ml  Output 1000 ml  Net -862 ml   Filed Weights   06/07/17 0336 06/08/17 0317 06/09/17 0357  Weight: 156 lb 12 oz (71.1 kg) 154 lb 8.7 oz (70.1 kg) 157 lb 3 oz (71.3 kg)   PHYSICAL EXAMINATION:  General: This is a 77 year old white still has difficulty with exertional dyspnea, talking in 1-2 free sentences HEENT: Normocephalic atraumatic no jugular venous distention mucous membranes are moist Pulmonary: Posterior rales, no accessory use at rest Cardiac: Regular irregular with atrial fibrillation on the monitor Extremities/musculoskeletal brisk cap refill no edema Abdomen: Soft nontender Neuro/psych: Awake oriented no focal deficits appropriate affect  Recent Labs  Lab 06/07/17 0323 06/08/17 0440 06/09/17 0438  NA 137 134* 137  K 4.1 3.5 3.3*  CL 94* 93* 95*  CO2 _0 BUN 56* 71* 74*  CREATININE 1.72* 1.72* 1.57*  GLUCOSE 244* 312* 333*    Recent Labs  Lab 06/07/17 0323 06/08/17 0440 06/09/17 0438  HGB 10.5* 10.7* 10.6*  HCT 32.8* 32.9* 32.8*  WBC 11.8* 18.9* 17.0*  PLT 346 381 337    Dg Chest Port 1 View  Result Date: 06/09/2017 CLINICAL DATA:  Cough for several months.  Amiodarone toxicity EXAM: PORTABLE CHEST 1 VIEW COMPARISON:  Chest radiograph June 05, 2017 and chest CT June 06, 2017 FINDINGS: There is extensive pulmonary fibrotic type change. Patchy consolidation is noted in the right upper lobe and left lower lobe regions, stable. No new opacity evident. There is cardiomegaly with pulmonary vascularity within normal limits. There is aortic atherosclerosis. Pacemaker leads are attached to the right atrium and right ventricle. No bone lesions. IMPRESSION: Underlying pulmonary fibrosis, extensive. Areas of patchy consolidation in the right upper lobe and left lower lobe regions. No new opacity. Stable cardiomegaly.  There is aortic atherosclerosis. Aortic Atherosclerosis (ICD10-I70.0). Electronically Signed   By: Lowella Grip III M.D.   On: 06/09/2017 07:40      SIGNIFICANT EVENTS  2/19  Admit  2/22  PCCM consulted  2/23  45L/60% HFNC  2/24  40L/60% HFNC    STUDIES 2/22 CT Chest w/o >> Patchy consolidation in the dependent right upper lobe, lingula and dependent right lower lobe. Extensive thickening of the peribronchovascular interstitium and extensive ground-glass attenuation throughout both lungs with associated mild traction bronchiolectasis and architectural distortion. Nonspecific mediastinal and bilateral hilar adenopathy, likely reactive.  Dilated main pulmonary artery, suggesting pulmonary arterialHypertension. Three-vessel coronary atherosclerosis.  Small hiatal hernia. 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  She has a hx of chronic combined CHF and has been diuresed 3.8L and is clinically euvolemic.  Working w/ DDx includes amiodarone lung toxicity (she is also on bromocriptine which can be associated with pulmonary fibrosis), autoimmune pulmonary manifestation, cardiogenic pulmonary edema & hypersensitivity pneumonitis. -autoimmune panel neg -pcxr personally reviewed: showing improvement of RUL airspace disease and no sig change LLL.  -  Plan: F/u hypersensitivity panel Hold amiodarone and bromocriptine  indefinitely  Wean oxygen  PRN BIPAP  Now day # 3 solumedrol 125 mg q 6; will initiate taper in am 2/26 Change lasix to bid  Follow-up chest x-ray a.m. No role for bronch FIO2 requirements are too high and if continues to improve would not be worth give potential negative yield.   35 minutes  Erick Colace ACNP-BC [place] Pager # [phone] OR # [phone] if no answer

## 2017-06-09 NOTE — Progress Notes (Signed)
RT increased HHFNC 30L/45% FIO2. Pt in no distress. Pt eating at this time. Pt tol well

## 2017-06-09 NOTE — Progress Notes (Signed)
PROGRESS NOTE    Becky Forbes  UXL:[id number] DOB: 06-24-1940 DOA: 06/02/2017 PCP: Binnie Rail, MD   Chief Complaint  Patient presents with  . Shortness of Breath  . Atrial Fibrillation  . Chest Pain    Brief Narrative:  HPI on 06/02/2017 by Ms. [address] Becky Forbes is a 77 y.o. female with medical history significant of sick sinus syndrome with implanted pacemaker, paroxysmal atrial fibrillation, hypothyroidism, combined diastolic and systolic congestive heart failure, chronic kidney disease, hypothyroidism, diabetes mellitus who presented to the emergency department with complaints of worsening shortness of breath, cough productive of white sputum and chest tightness.  Patient reported that since December she was battling walled with cough and shortness of breath off and on and on Friday he was traveling to [place] to see his sister, had a lot of walking around and on the way back her daughter noticed that patient developed frequent coughs and some shortness of breath.  Since Friday her condition worsened and eventually this night she could not sleep at all and had to come to the ED for evaluation. She denied PND, orthopnea, chest pain, diaphoresis, lower extremity swelling, abdominal pain, she complained of nausea and dry heaves, but no vomiting.  Interim history Admitted for acute respiratory failure with hypoxia.  Patient found to have a reduced EF, cardiology consulted and appreciated. Continues to be hypoxic and unable to wean off of bipap. Obtained CT chest w/o contrast which showed ARDS. PCCM consulted.  Assessment & Plan   Acute respiratory failure with hypoxia  -Suspect multifactorial including systolic CHF exacerbation, atrial fibrillation versus flutter, suspected pneumonia -Currently requiring BiPAP and HighFlo -Continue nebulizer treatments -DDimer 2.43 -Have ordered a VQ scan however this cannot be done as patient is currently on BiPAP. Lower extremity  doppler negative for DVT -Patient was recently placed on Eliquis however may have had blood clot prior to starting this medication -Ordered CT chest w/o contrast: suspected ARDS complicating a multilobar pneumonia or acute interstitial pneumonia.  -PCCM consulted and appreciated- recommended high dose steroids, discontinuation of amiodarone and bromocriptine planning to; wean steroids after 72 hours- suspect will occur today -Autoimmune workup: Sjogrens A/B , MPO, RF negative -ANCA, hypersensitivity pneumonitis, ANA pending -CXR today: pulm fibrosis, RUL/LLL consolidations  Acute on chronic systolic CHF exacerbation -Echocardiogram showed EF of 30-35% (echocardiogram December 2015 showed an EF of 50-55%) -BNP on admission 2340.8 -Placed on IV Lasix 40 mg daily -Monitor intake and output, daily weights  -cardiology consulted and appreciated -urine output over past 24 hours 1400cc  Sepsis secondary to Pneumonia -Chest x-ray on admission showed new bilateral pulmonary infiltrates- possible pneumonia. -upon admission, patient with leukocytosis, tachycardia and elevated lactic acid level -Continue Levaquin -Blood culture shows no growth to date -Urine strep pneumonia antigen negative -Repeat chest x-ray today showed stable right upper and left lower lobe infiltrates -CT scan noted above  Paroxysmal atrial fibrillation/atrial flutter -Patient was in normal sinus rhythm at previous cardiology visit per outpatient notes -CHADSVASC 6 -TSH 0.405 -Patient placed on Eliquis-transition to heparin as patient currently on BiPAP and is NPO -Amiodarone discontinued due to respiratory symptoms -Cardiology consulted and appreciated  Acute kidney injury on chronic kidney disease, stage III -Creatinine currently 1.57, baseline  approximately 1.3-1.4  Essential hypertension -Ramipril held -Continue metoprolol-will change to IV form 2.5 mg every 6 hours with holding parameters  Diabetes mellitus,  type II -Home Januvia and repaglinide held -Continue insulin sliding scale CBG monitoring -lantus added as patient is currently  on high dose steroids -Hemoglobin A1c 7.7   Hypomagnesemia  -replaced, currently 2 -Continue to monitor and replace as needed  Hypothyroidism -TSH 0.405 -Continue IV synthroid   Hypokalemia -Secondary to diuresis, continue to replace, currently 3.3 -continue to monitor BMP  Hyperlipidemia -Continue statin  History of GI bleed -Secondary to ischemic colitis, patient was on Coumadin at that time -Case was briefly discussed with cardiologist, Dr. Acie Fredrickson, who recommended anticoagulation given her high CHADSVASC score -Closely monitor for signs and symptoms of bleeding -Continue to monitor CBC-hemoglobin appears to be stable, currently 10.6  Sick sinus syndrome -Has dual-chamber pacemaker, and currently on telemetry  Nausea -Continue Zofran as needed  DVT Prophylaxis  Heparin   Code Status: Full  Family Communication: Family at bedside  Disposition Plan:  Remains admitted due to respiratory failure and need for BIPAP/High Flow   Consultants Cardiology PCCM  Procedures  Echocardiogram Lower extremity doppler  Antibiotics   Anti-infectives (From admission, onward)   Start     Dose/Rate Route Frequency Ordered Stop   06/03/17 1400  levofloxacin (LEVAQUIN) IVPB 500 mg     500 mg 100 mL/hr over 60 Minutes Intravenous Every 24 hours 06/02/17 1329 06/07/17 1455   06/02/17 1230  levofloxacin (LEVAQUIN) IVPB 500 mg     500 mg 100 mL/hr over 60 Minutes Intravenous  Once 06/02/17 1158 06/02/17 1543      Subjective:   Becky Forbes seen and examined today.  Patient currently on high flow feeling better.  Feels her breathing has improved mildly.  Does continue to feel weak.  Currently denies chest pain, abdominal pain, nausea vomiting, diarrhea or constipation, dizziness or headache.  Objective:   Vitals:   06/09/17 0300 06/09/17 0357 06/09/17  0548 06/09/17 0630  BP:  111/83 113/81   Pulse: 82 79 85 79  Resp: 19 (!) 26 19 12   Temp:  98.2 F (36.8 C)    TempSrc:  Axillary    SpO2: 97% 97% 92% 98%  Weight:  71.3 kg (157 lb 3 oz)    Height:        Intake/Output Summary (Last 24 hours) at 06/09/2017 0932 Last data filed at 06/09/2017 1191 Gross per 24 hour  Intake 138 ml  Output 1000 ml  Net -862 ml   Filed Weights   06/07/17 0336 06/08/17 0317 06/09/17 0357  Weight: 71.1 kg (156 lb 12 oz) 70.1 kg (154 lb 8.7 oz) 71.3 kg (157 lb 3 oz)   Exam  General: Well developed, well nourished, NAD, appears stated age  31: NCAT, mucous membranes moist.   Neck: Supple, no JVD  Cardiovascular: S1 S2 auscultated, irregular, no murmur  Respiratory: Diminished breath sounds, however clear (anteriorly)  Abdomen: Soft, nontender, nondistended, + bowel sounds  Extremities: warm dry without cyanosis clubbing or edema  Neuro: AAOx3, nonfocal  Skin: Without rashes exudates or nodules  Psych: pleasant, appropriate mood and affect  Data Reviewed: I have personally reviewed following labs and imaging studies  CBC: Recent Labs  Lab 06/03/17 0453 06/04/17 0700 06/05/17 0438 06/06/17 0520 06/07/17 0323 06/08/17 0440 06/09/17 0438  WBC 11.8* 15.7* 15.2* 12.3* 11.8* 18.9* 17.0*  NEUTROABS 10.1* 14.0*  --   --   --   --   --   HGB 10.1* 9.6* 9.4* 9.8* 10.5* 10.7* 10.6*  HCT 31.2* 29.7* 29.2* 31.4* 32.8* 32.9* 32.8*  MCV 92.3 92.5 91.3 93.5 92.4 90.6 90.4  PLT 246 319 308 336 346 381 478   Basic Metabolic Panel: Recent  Labs  Lab 06/04/17 0700 06/05/17 0438 06/06/17 0520 06/07/17 0323 06/08/17 0440 06/09/17 0438  NA 133* 134* 140 137 134* 137  K 3.7 3.3* 3.8 4.1 3.5 3.3*  CL 96* 97* 99* 94* 93* 95*  CO2 22 24 27 30 27 28   GLUCOSE 154* 150* 156* 244* 312* 333*  BUN 33* 36* 41* 56* 71* 74*  CREATININE 1.95* 1.80* 1.60* 1.72* 1.72* 1.57*  CALCIUM 8.2* 8.1* 8.7* 8.7* 8.1* 8.2*  MG 1.5* 1.8 2.1  --  2.0  --   PHOS  3.2  --   --   --   --   --    GFR: Estimated Creatinine Clearance: 29 mL/min (A) (by C-G formula based on SCr of 1.57 mg/dL (H)). Liver Function Tests: Recent Labs  Lab 06/04/17 0700  AST 25  ALT 15  ALKPHOS 112  BILITOT 1.9*  PROT 7.4  ALBUMIN 2.4*   No results for input(s): LIPASE, AMYLASE in the last 168 hours. No results for input(s): AMMONIA in the last 168 hours. Coagulation Profile: No results for input(s): INR, PROTIME in the last 168 hours. Cardiac Enzymes: No results for input(s): CKTOTAL, CKMB, CKMBINDEX, TROPONINI in the last 168 hours. BNP (last 3 results) No results for input(s): PROBNP in the last 8760 hours. HbA1C: No results for input(s): HGBA1C in the last 72 hours. CBG: Recent Labs  Lab 06/08/17 0739 06/08/17 1152 06/08/17 1639 06/08/17 2132 06/09/17 0727  GLUCAP 332* 283* 224* 278* 324*   Lipid Profile: No results for input(s): CHOL, HDL, LDLCALC, TRIG, CHOLHDL, LDLDIRECT in the last 72 hours. Thyroid Function Tests: No results for input(s): TSH, T4TOTAL, FREET4, T3FREE, THYROIDAB in the last 72 hours. Anemia Panel: No results for input(s): VITAMINB12, FOLATE, FERRITIN, TIBC, IRON, RETICCTPCT in the last 72 hours. Urine analysis:    Component Value Date/Time   COLORURINE YELLOW 01/04/2015 1620   APPEARANCEUR CLEAR 01/04/2015 1620   LABSPEC 1.017 01/04/2015 1620   PHURINE 5.0 01/04/2015 1620   GLUCOSEU NEGATIVE 01/04/2015 1620   HGBUR TRACE (A) 01/04/2015 1620   BILIRUBINUR NEGATIVE 01/04/2015 1620   BILIRUBINUR negative 08/05/2012 1435   KETONESUR NEGATIVE 01/04/2015 1620   PROTEINUR NEGATIVE 01/04/2015 1620   UROBILINOGEN 0.2 01/04/2015 1620   NITRITE NEGATIVE 01/04/2015 1620   LEUKOCYTESUR TRACE (A) 01/04/2015 1620   Sepsis Labs: @LABRCNTIP (procalcitonin:4,lacticidven:4)  ) Recent Results (from the past 240 hour(s))  Culture, blood (routine x 2)     Status: None   Collection Time: 06/02/17 11:31 AM  Result Value Ref Range Status     Specimen Description BLOOD RIGHT ANTECUBITAL  Final   Special Requests   Final    BOTTLES DRAWN AEROBIC AND ANAEROBIC Blood Culture adequate volume   Culture   Final    NO GROWTH 5 DAYS Performed at [facility], [place] [address], [place], [place] [address]    Report Status 06/07/2017 FINAL  Final  Respiratory Panel by PCR     Status: None   Collection Time: 06/03/17 10:49 AM  Result Value Ref Range Status   Adenovirus NOT DETECTED NOT DETECTED Final   Coronavirus 229E NOT DETECTED NOT DETECTED Final   Coronavirus HKU1 NOT DETECTED NOT DETECTED Final   Coronavirus NL63 NOT DETECTED NOT DETECTED Final   Coronavirus OC43 NOT DETECTED NOT DETECTED Final   Metapneumovirus NOT DETECTED NOT DETECTED Final   Rhinovirus / Enterovirus NOT DETECTED NOT DETECTED Final   Influenza A NOT DETECTED NOT DETECTED Final   Influenza B NOT DETECTED  NOT DETECTED Final   Parainfluenza Virus 1 NOT DETECTED NOT DETECTED Final   Parainfluenza Virus 2 NOT DETECTED NOT DETECTED Final   Parainfluenza Virus 3 NOT DETECTED NOT DETECTED Final   Parainfluenza Virus 4 NOT DETECTED NOT DETECTED Final   Respiratory Syncytial Virus NOT DETECTED NOT DETECTED Final   Bordetella pertussis NOT DETECTED NOT DETECTED Final   Chlamydophila pneumoniae NOT DETECTED NOT DETECTED Final   Mycoplasma pneumoniae NOT DETECTED NOT DETECTED Final    Comment: Performed at [facility], [place] [address] [place], [place] [address]  MRSA PCR Screening     Status: None   Collection Time: 06/05/17 12:01 AM  Result Value Ref Range Status   MRSA by PCR NEGATIVE NEGATIVE Final    Comment:        The GeneXpert MRSA Assay (FDA approved for NASAL specimens only), is one component of a comprehensive MRSA colonization surveillance program. It is not intended to diagnose MRSA infection nor to guide or monitor treatment for MRSA infections. Performed at [facility], [place] [address] [place], [place] [address]        Radiology Studies: Dg Chest Port 1 View  Result Date: 06/09/2017 CLINICAL DATA:  Cough for several months.  Amiodarone toxicity EXAM: PORTABLE CHEST 1 VIEW COMPARISON:  Chest radiograph June 05, 2017 and chest CT June 06, 2017 FINDINGS: There is extensive pulmonary fibrotic type change. Patchy consolidation is noted in the right upper lobe and left lower lobe regions, stable. No new opacity evident. There is cardiomegaly with pulmonary vascularity within normal limits. There is aortic atherosclerosis. Pacemaker leads are attached to the right atrium and right ventricle. No bone lesions. IMPRESSION: Underlying pulmonary fibrosis, extensive. Areas of patchy consolidation in the right upper lobe and left lower lobe regions. No new opacity. Stable cardiomegaly. There is aortic atherosclerosis. Aortic Atherosclerosis (ICD10-I70.0). Electronically Signed   By: Lowella Grip III M.D.   On: 06/09/2017 07:40     Scheduled Meds: . atorvastatin  20 mg Oral Daily  . furosemide  40 mg Intravenous Daily  . guaiFENesin  1,200 mg Oral BID  . insulin aspart  0-15 Units Subcutaneous TID WC  . insulin aspart  0-5 Units Subcutaneous QHS  . insulin glargine  15 Units Subcutaneous Daily  . levothyroxine  44 mcg Intravenous Daily  . mouth rinse  15 mL Mouth Rinse BID  . methylPREDNISolone (SOLU-MEDROL) injection  125 mg Intravenous Q6H  . metoprolol tartrate  2.5 mg Intravenous Q6H   Continuous Infusions: . heparin 1,150 Units/hr (06/09/17 0022)  . potassium chloride       LOS: 7 days   Time Spent in minutes   30 minutes  Arham Symmonds D.O. on 06/09/2017 at 9:31 AM  Between 7am to 7pm - Pager - [phone]  After 7pm go to [email or web address] - password [email or web address]  And look for the night coverage person covering for me after hours  [facility] Office  [phone]

## 2017-06-09 NOTE — Progress Notes (Signed)
ANTICOAGULATION CONSULT NOTE - Follow Up Consult  Pharmacy Consult for Heparin (Eliquis on hold) Indication: atrial fibrillation  Patient Measurements: Height: 5\' 4"  (162.6 cm) Weight: 157 lb 3 oz (71.3 kg) IBW/kg (Calculated) : 54.7 Heparin Dosing Weight: 71 kg  Vital Signs: Temp: 98.4 F (36.9 C) (02/25 1216) Temp Source: Axillary (02/25 1216) BP: 118/82 (02/25 1216) Pulse Rate: 86 (02/25 1216)  Labs: Recent Labs    06/07/17 0323 06/08/17 0440 06/09/17 0438  HGB 10.5* 10.7* 10.6*  HCT 32.8* 32.9* 32.8*  PLT 346 381 337  APTT 66* 83* 95*  HEPARINUNFRC 2.10* 1.02* 0.87*  CREATININE 1.72* 1.72* 1.57*    Estimated Creatinine Clearance: 29 mL/min (A) (by C-G formula based on SCr of 1.57 mg/dL (H)).  Assessment:  77 year old female on Eliquis PTA for Afib, last dose 2/20 at 10 pm, continues on IV heparin for Afib. Heparin levels have been falsely elevated due to recent Eliquis doses, but aPTT remains therapeutic (95 seconds) on 1150 units/hr. Heparin level 0.87, getting closer to correlating with aPTTs.   Goal of Therapy:  Heparin level 0.3-0.7 units/ml aPTT 66-102 seconds Monitor platelets by anticoagulation protocol: Yes   Plan:   Continue heparin drip at 1150 units/hr.  Daily heparin level, aPTT and CBC.  Eliquis on hold.  Arty Baumgartner, [place] Pager: [phone] or [phone] 06/09/2017,3:45 PM

## 2017-06-09 NOTE — Consult Note (Signed)
[facility] Primary Care Navigator  06/09/2017  Becky Forbes 07-Sep-1940 [id number]   Met withpatient, husband Laverna Peace) and daughters Colletta [place] and Jackelyn Poling) at the bedsideto identify possible discharge needs. Patient reports seeing primary care provider and was directed to the hospital due to having "worsening shortness of breath, low oxygen level and weakness" that resulted to this admission.  Patientendorses Dr.Stacy Burns with [facility] [facility] as herprimary care provider.   Patient verbalized using [facility] serviceand [facility] [place] to obtain medications without difficulty.   Patientreportsmanagingherown medications at [facility] use of "pill box" system filled every week.  Patientstatesthatshe was driving prior to admission but daughters will providetransportation to herdoctors'appointments after discharge.  Patientlives withhusbandat home. Daughters will alternate in assisting with care when discharged.  Anticipated discharge plan is homewhen improved per patient.   Patient and daughters voiced understanding to call primary care provider's officewhenshereturnshome,for a post discharge follow-up within1-2 weeksor sooner if needs arise.Patient letter (with PCP's contact number) was provided astheirreminder.  Discussed with patientregarding [facility] CM services available for health managementat home but she denies any current needs or concerns at this time. Patient is well informed of ways in managing DM and HF.  She voiced understanding of needto seekreferral from primary care provider to [facility] care management if deemednecessaryand appropriate for any services in thefuture.  [facility] care management information was provided for future needs thatpatientmay have.  Patient however, verbally agreed and opted for EMMI Pneumonia calls to follow-up with recovery at home. Referral made for EMMI Pneumonia  callsafter discharge.   For additional questions please contact:  Edwena Felty A. Melat Wrisley, BSN, RN-BC [facility] PRIMARY CARE Navigator Cell: [phone]

## 2017-06-09 NOTE — Progress Notes (Addendum)
Progress Note  Patient Name: Becky Forbes Date of Encounter: 06/09/2017  Primary Cardiologist: No primary care provider on file.   Subjective   Patient reports some mild improvement in her breathing.  She is currently on HFNC.  She reports using the bipap overnight.  She denies chest pain or heart palpitations.  Inpatient Medications    Scheduled Meds: . atorvastatin  20 mg Oral Daily  . furosemide  40 mg Intravenous Daily  . guaiFENesin  1,200 mg Oral BID  . insulin aspart  0-15 Units Subcutaneous TID WC  . insulin aspart  0-5 Units Subcutaneous QHS  . insulin glargine  15 Units Subcutaneous Daily  . levothyroxine  44 mcg Intravenous Daily  . mouth rinse  15 mL Mouth Rinse BID  . methylPREDNISolone (SOLU-MEDROL) injection  125 mg Intravenous Q6H  . metoprolol tartrate  2.5 mg Intravenous Q6H   Continuous Infusions: . heparin 1,150 Units/hr (06/09/17 0022)   PRN Meds: acetaminophen, ipratropium, levalbuterol, ondansetron **OR** ondansetron (ZOFRAN) IV, polyethylene glycol, traZODone   Vital Signs    Vitals:   06/09/17 0300 06/09/17 0357 06/09/17 0548 06/09/17 0630  BP:  111/83 113/81   Pulse: 82 79 85 79  Resp: 19 (!) 26 19 12   Temp:  98.2 F (36.8 C)    TempSrc:  Axillary    SpO2: 97% 97% 92% 98%  Weight:  71.3 kg (157 lb 3 oz)    Height:        Intake/Output Summary (Last 24 hours) at 06/09/2017 0846 Last data filed at 06/09/2017 3235 Gross per 24 hour  Intake 138 ml  Output 1000 ml  Net -862 ml   Filed Weights   06/07/17 0336 06/08/17 0317 06/09/17 0357  Weight: 71.1 kg (156 lb 12 oz) 70.1 kg (154 lb 8.7 oz) 71.3 kg (157 lb 3 oz)    Telemetry    PVCs, atrial fibrillation - Personally Reviewed  ECG    No new tracings - Personally Reviewed  Physical Exam   GEN: No acute distress.   Neck: No JVD Cardiac: Irregularly Irregular, no murmurs, rubs, or gallops.  Respiratory: Diffuse crackles throughout all lung fields GI: Soft, nontender,  non-distended  MS: No edema; No deformity. Neuro:  Nonfocal  Psych: Normal affect   Labs    Chemistry Recent Labs  Lab 06/04/17 0700  06/07/17 0323 06/08/17 0440 06/09/17 0438  NA 133*   < > 137 134* 137  K 3.7   < > 4.1 3.5 3.3*  CL 96*   < > 94* 93* 95*  CO2 22   < > 30 27 28   GLUCOSE 154*   < > 244* 312* 333*  BUN 33*   < > 56* 71* 74*  CREATININE 1.95*   < > 1.72* 1.72* 1.57*  CALCIUM 8.2*   < > 8.7* 8.1* 8.2*  PROT 7.4  --   --   --   --   ALBUMIN 2.4*  --   --   --   --   AST 25  --   --   --   --   ALT 15  --   --   --   --   ALKPHOS 112  --   --   --   --   BILITOT 1.9*  --   --   --   --   GFRNONAA 24*   < > 27* 27* 31*  GFRAA 27*   < > 32* 32* 36*  ANIONGAP 15   < > 13 14 14    < > = values in this interval not displayed.     Hematology Recent Labs  Lab 06/07/17 0323 06/08/17 0440 06/09/17 0438  WBC 11.8* 18.9* 17.0*  RBC 3.55* 3.63* 3.63*  HGB 10.5* 10.7* 10.6*  HCT 32.8* 32.9* 32.8*  MCV 92.4 90.6 90.4  MCH 29.6 29.5 29.2  MCHC 32.0 32.5 32.3  RDW 15.0 14.9 14.9  PLT 346 381 337    Cardiac EnzymesNo results for input(s): TROPONINI in the last 168 hours.  Recent Labs  Lab 06/02/17 1146  TROPIPOC 0.04     BNP Recent Labs  Lab 06/02/17 1123  BNP 2,340.8*     DDimer  Recent Labs  Lab 06/05/17 0730  DDIMER 2.43*     Radiology    Dg Chest Port 1 View  Result Date: 06/09/2017 CLINICAL DATA:  Cough for several months.  Amiodarone toxicity EXAM: PORTABLE CHEST 1 VIEW COMPARISON:  Chest radiograph June 05, 2017 and chest CT June 06, 2017 FINDINGS: There is extensive pulmonary fibrotic type change. Patchy consolidation is noted in the right upper lobe and left lower lobe regions, stable. No new opacity evident. There is cardiomegaly with pulmonary vascularity within normal limits. There is aortic atherosclerosis. Pacemaker leads are attached to the right atrium and right ventricle. No bone lesions. IMPRESSION: Underlying pulmonary  fibrosis, extensive. Areas of patchy consolidation in the right upper lobe and left lower lobe regions. No new opacity. Stable cardiomegaly. There is aortic atherosclerosis. Aortic Atherosclerosis (ICD10-I70.0). Electronically Signed   By: Lowella Grip III M.D.   On: 06/09/2017 07:40    Cardiac Studies   Echocardiogram 06/03/2017: Study Conclusions  - Left ventricle: The cavity size was mildly dilated. Systolic function was moderately to severely reduced. The estimated ejection fraction was in the range of 30% to 35%. Diffuse hypokinesis. - Aortic valve: Noncoronary cusp mobility was restricted. Transvalvular velocity was within the normal range. There was no stenosis. There was no regurgitation. - Mitral valve: Mildly calcified annulus. Transvalvular velocity was within the normal range. There was no evidence for stenosis. There was mild to moderate regurgitation. - Left atrium: The atrium was severely dilated. - Right ventricle: The cavity size was normal. Wall thickness was normal. Systolic function was normal. - Right atrium: The atrium was severely dilated. - Atrial septum: No defect or patent foramen ovale was identified by color flow Doppler. - Tricuspid valve: There was mild regurgitation. - Pulmonary arteries: Systolic pressure was within the normal range. PA peak pressure: 27 mm Hg (S).  Impressions:  - Compared with the echo 34/74/25, systolic function is reduced.  Patient Profile     77 y.o. female 77 y.o. female with persistent atrial fibrillation, nonischemic cardiomyopathy, sick sinus syndrome status post pacemaker, previous GI bleed, and CKD stage 3.  She is being managed for hypoxic respiratory failure with parenchymal lung disease/groundglass opacities, question of toxicity related to amiodarone or bromocriptine, both of which have been discontinued.  She is being followed by the Pulmonary service.   Assessment & Plan    1.  Persistent atrial fibrillation Continues to be on IV heparin.    On and off bipap.  On low dose of IV metoprolol tartrate. Rates controlled. Transition to oral home regimen when able to tolerate meds PO.  2. Nonischemic Cardiomyopathy TTE this admission showed LVEF 30-35%.  Output of -5L this admission with weights down 5lb. Continuing to diurese with IV lasix 40 mg daily.  Would  benefit from outpatient ischemic evaluation at some point after recovers from acute illness.  3.  CKD stage III creatinine stable at 1.5 improved from 1.7 yesterday.  4.  Hypoxic respiratory failure On HFNC with intermittent use of BiPAP.  Management per Pulmonary service.  Possible toxicity related to amiodarone or bromocriptine suspected.  She is on steroids.  5. Hypokalemia K 3.3, repleating    For questions or updates, please contact [place] Please consult [email or web address] for contact info under Cardiology/STEMI.      Signed, Boyd Kerbs, DO  06/09/2017, 8:46 AM

## 2017-06-10 ENCOUNTER — Inpatient Hospital Stay (HOSPITAL_COMMUNITY): Payer: Medicare Other

## 2017-06-10 DIAGNOSIS — J189 Pneumonia, unspecified organism: Secondary | ICD-10-CM

## 2017-06-10 DIAGNOSIS — I509 Heart failure, unspecified: Secondary | ICD-10-CM

## 2017-06-10 LAB — APTT: APTT: 68 s — AB (ref 24–36)

## 2017-06-10 LAB — BASIC METABOLIC PANEL
Anion gap: 14 (ref 5–15)
BUN: 63 mg/dL — ABNORMAL HIGH (ref 6–20)
CALCIUM: 8.7 mg/dL — AB (ref 8.9–10.3)
CO2: 29 mmol/L (ref 22–32)
Chloride: 96 mmol/L — ABNORMAL LOW (ref 101–111)
Creatinine, Ser: 1.51 mg/dL — ABNORMAL HIGH (ref 0.44–1.00)
GFR calc non Af Amer: 32 mL/min — ABNORMAL LOW (ref >60)
GFR, EST AFRICAN AMERICAN: 37 mL/min — AB (ref >60)
Glucose, Bld: 424 mg/dL — ABNORMAL HIGH (ref 65–99)
Potassium: 4.5 mmol/L (ref 3.5–5.1)
SODIUM: 139 mmol/L (ref 135–145)

## 2017-06-10 LAB — CBC
HCT: 34.8 % — ABNORMAL LOW (ref 36.0–46.0)
Hemoglobin: 11.1 g/dL — ABNORMAL LOW (ref 12.0–15.0)
MCH: 28.8 pg (ref 26.0–34.0)
MCHC: 31.9 g/dL (ref 30.0–36.0)
MCV: 90.2 fL (ref 78.0–100.0)
PLATELETS: 341 10*3/uL (ref 150–400)
RBC: 3.86 MIL/uL — ABNORMAL LOW (ref 3.87–5.11)
RDW: 15 % (ref 11.5–15.5)
WBC: 17.5 10*3/uL — AB (ref 4.0–10.5)

## 2017-06-10 LAB — ANCA TITERS: C-ANCA: <1:20 {titer}

## 2017-06-10 LAB — GLUCOSE, CAPILLARY
GLUCOSE-CAPILLARY: 379 mg/dL — AB (ref 65–99)
GLUCOSE-CAPILLARY: 427 mg/dL — AB (ref 65–99)
GLUCOSE-CAPILLARY: 225 mg/dL — AB (ref 65–99)
Glucose-Capillary: 313 mg/dL — ABNORMAL HIGH (ref 65–99)

## 2017-06-10 LAB — HEPARIN LEVEL (UNFRACTIONATED): Heparin Unfractionated: 0.52 IU/mL (ref 0.30–0.70)

## 2017-06-10 MED ORDER — INSULIN GLARGINE 100 UNIT/ML ~~LOC~~ SOLN
7.0000 [IU] | Freq: Once | SUBCUTANEOUS | Status: AC
  Administered 2017-06-10: 7 [IU] via SUBCUTANEOUS
  Filled 2017-06-10: qty 0.07

## 2017-06-10 MED ORDER — INSULIN GLARGINE 100 UNIT/ML ~~LOC~~ SOLN
22.0000 [IU] | Freq: Every day | SUBCUTANEOUS | Status: DC
  Administered 2017-06-11: 22 [IU] via SUBCUTANEOUS
  Filled 2017-06-10: qty 0.22

## 2017-06-10 MED ORDER — METOPROLOL TARTRATE 12.5 MG HALF TABLET
12.5000 mg | ORAL_TABLET | Freq: Two times a day (BID) | ORAL | Status: DC
  Administered 2017-06-10 – 2017-06-13 (×7): 12.5 mg via ORAL
  Filled 2017-06-10 (×7): qty 1

## 2017-06-10 MED ORDER — METOPROLOL TARTRATE 5 MG/5ML IV SOLN: 2.5000 mg | Freq: Four times a day (QID) | INTRAVENOUS | Status: DC | PRN

## 2017-06-10 NOTE — Progress Notes (Signed)
PROGRESS NOTE    Becky Forbes  ZOX:[id number] DOB: 1940/11/11 DOA: 06/02/2017 PCP: Binnie Rail, MD   Chief Complaint  Patient presents with  . Shortness of Breath  . Atrial Fibrillation  . Chest Pain    Brief Narrative:  HPI on 06/02/2017 by Ms. [address] Becky Forbes is a 77 y.o. female with medical history significant of sick sinus syndrome with implanted pacemaker, paroxysmal atrial fibrillation, hypothyroidism, combined diastolic and systolic congestive heart failure, chronic kidney disease, hypothyroidism, diabetes mellitus who presented to the emergency department with complaints of worsening shortness of breath, cough productive of white sputum and chest tightness.  Patient reported that since December she was battling walled with cough and shortness of breath off and on and on Friday he was traveling to [place] to see his sister, had a lot of walking around and on the way back her daughter noticed that patient developed frequent coughs and some shortness of breath.  Since Friday her condition worsened and eventually this night she could not sleep at all and had to come to the ED for evaluation. She denied PND, orthopnea, chest pain, diaphoresis, lower extremity swelling, abdominal pain, she complained of nausea and dry heaves, but no vomiting.  Interim history Admitted for acute respiratory failure with hypoxia.  Patient found to have a reduced EF, cardiology consulted and appreciated. Continues to be hypoxic and unable to wean off of bipap. Obtained CT chest w/o contrast which showed ARDS. PCCM consulted.   Assessment & Plan   Acute respiratory failure with hypoxia  -Suspect multifactorial including systolic CHF exacerbation, atrial fibrillation versus flutter, suspected pneumonia -Continue nebulizer treatments -DDimer 2.43 -Have ordered a VQ scan however this cannot be done as patient is currently on BiPAP. Lower extremity doppler negative for DVT -Patient was  recently placed on Eliquis however may have had blood clot prior to starting this medication -CT chest w/o contrast: suspected ARDS complicating a multilobar pneumonia or acute interstitial pneumonia.  -PCCM consulted and appreciated- recommended high dose steroids, discontinuation of amiodarone and bromocriptine planning to; wean steroids after 72 hours- suspect will occur today -Autoimmune workup: Sjogrens A/B , MPO, RF negative -ANCA, hypersensitivity pneumonitis, ANA pending -CXR today: pulm fibrosis, RUL/LLL consolidations -currently on HighFlow and remaining stable, has not required BiPAP  Acute on chronic systolic CHF exacerbation -Echocardiogram showed EF of 30-35% (echocardiogram December 2015 showed an EF of 50-55%) -BNP on admission 2340.8 -Placed on IV Lasix 40 mg BID- per cardiology will likely transition to oral on 2/27 -Monitor intake and output, daily weights  -cardiology consulted and appreciated -urine output over past 24 hours 1047cc  Sepsis secondary to Pneumonia -Chest x-ray on admission showed new bilateral pulmonary infiltrates- possible pneumonia. -upon admission, patient with leukocytosis, tachycardia and elevated lactic acid level -Continue Levaquin -Blood culture shows no growth to date -Urine strep pneumonia antigen negative -Repeat chest x-ray today showed stable right upper and left lower lobe infiltrates -CT scan noted above  Paroxysmal atrial fibrillation/atrial flutter -Patient was in normal sinus rhythm at previous cardiology visit per outpatient notes -CHADSVASC 6 -TSH 0.405 -Patient placed on Eliquis-transition to heparin as patient currently on BiPAP and is NPO -Amiodarone discontinued due to respiratory symptoms -Cardiology consulted and appreciated  Acute kidney injury on chronic kidney disease, stage III -Creatinine peaked at 1.95, baseline  approximately 1.3-1.4 -Creatinine currently 1.51 -Continue to monitor BMP  Essential  hypertension -Ramipril held -Continue metoprolol (transitioned to oral form)  Diabetes mellitus, type II -Home Januvia and  repaglinide held -Continue insulin sliding scale CBG monitoring -lantus added as patient is currently on high dose steroids- will increase lantus to 22u -Hemoglobin A1c 7.7   Hypomagnesemia  -resolved -Continue to monitor and replace as needed  Hypothyroidism -TSH 0.405 -Continue IV synthroid   Hypokalemia -Secondary to diuresis, -resolved -continue to monitor BMP  Hyperlipidemia -Continue statin  History of GI bleed -Secondary to ischemic colitis, patient was on Coumadin at that time -Case was briefly discussed with cardiologist, Dr. Acie Fredrickson, who recommended anticoagulation given her high CHADSVASC score -Closely monitor for signs and symptoms of bleeding -Continue to monitor CBC-hemoglobin appears to be stable, currently 11.1  Sick sinus syndrome -Has dual-chamber pacemaker, and currently on telemetry  Nausea -Continue Zofran as needed  DVT Prophylaxis  Heparin   Code Status: Full  Family Communication: Family at bedside  Disposition Plan:  Admitted. Currently on High flow oxygen. Dispo TBD   Consultants Cardiology PCCM  Procedures  Echocardiogram Lower extremity doppler  Antibiotics   Anti-infectives (From admission, onward)   Start     Dose/Rate Route Frequency Ordered Stop   06/09/17 1700  levofloxacin (LEVAQUIN) IVPB 500 mg     500 mg 100 mL/hr over 60 Minutes Intravenous Every 24 hours 06/09/17 1551     06/03/17 1400  levofloxacin (LEVAQUIN) IVPB 500 mg     500 mg 100 mL/hr over 60 Minutes Intravenous Every 24 hours 06/02/17 1329 06/07/17 1455   06/02/17 1230  levofloxacin (LEVAQUIN) IVPB 500 mg     500 mg 100 mL/hr over 60 Minutes Intravenous  Once 06/02/17 1158 06/02/17 1543      Subjective:   Becky Forbes seen and examined today.  Patient feels breathing has improved and she is able to stay off of BiPAP longer.  States  she did not use BiPAP overnight.  Denies any current chest pain, abdominal pain, nausea or vomiting, diarrhea or constipation, dizziness or headache.  Objective:   Vitals:   06/10/17 0137 06/10/17 0641 06/10/17 0731 06/10/17 1155  BP:  (!) 133/92 129/77 120/71  Pulse:  90 86 88  Resp:  16 18   Temp:  97.6 F (36.4 C) (!) 97.5 F (36.4 C)   TempSrc:   Axillary   SpO2: 97% 95% 90%   Weight:  71.2 kg (156 lb 15.5 oz)    Height:        Intake/Output Summary (Last 24 hours) at 06/10/2017 1201 Last data filed at 06/10/2017 1100 Gross per 24 hour  Intake 628.13 ml  Output 2075 ml  Net -1446.87 ml   Filed Weights   06/08/17 0317 06/09/17 0357 06/10/17 0641  Weight: 70.1 kg (154 lb 8.7 oz) 71.3 kg (157 lb 3 oz) 71.2 kg (156 lb 15.5 oz)   Exam  General: Well developed, well nourished, NAD, appears stated age  57: NCAT,mucous membranes moist.   Neck: Supple, no JVD  Cardiovascular: S1 S2 auscultated, irregular,  Respiratory: Diminished breath sounds  Abdomen: Soft, nontender, nondistended, + bowel sounds  Extremities: warm dry without cyanosis clubbing or edema  Neuro: AAOx3, nonfocal  Psych: Normal affect and demeanor with intact judgement and insight   Data Reviewed: I have personally reviewed following labs and imaging studies  CBC: Recent Labs  Lab 06/04/17 0700  06/06/17 0520 06/07/17 0323 06/08/17 0440 06/09/17 0438 06/10/17 0332  WBC 15.7*   < > 12.3* 11.8* 18.9* 17.0* 17.5*  NEUTROABS 14.0*  --   --   --   --   --   --  HGB 9.6*   < > 9.8* 10.5* 10.7* 10.6* 11.1*  HCT 29.7*   < > 31.4* 32.8* 32.9* 32.8* 34.8*  MCV 92.5   < > 93.5 92.4 90.6 90.4 90.2  PLT 319   < > 336 346 381 337 341   < > = values in this interval not displayed.   Basic Metabolic Panel: Recent Labs  Lab 06/04/17 0700 06/05/17 0438 06/06/17 0520 06/07/17 0323 06/08/17 0440 06/09/17 0438 06/10/17 0838  NA 133* 134* 140 137 134* 137 139  K 3.7 3.3* 3.8 4.1 3.5 3.3* 4.5  CL  96* 97* 99* 94* 93* 95* 96*  CO2 22 24 27 30 27 28 29   GLUCOSE 154* 150* 156* 244* 312* 333* 424*  BUN 33* 36* 41* 56* 71* 74* 63*  CREATININE 1.95* 1.80* 1.60* 1.72* 1.72* 1.57* 1.51*  CALCIUM 8.2* 8.1* 8.7* 8.7* 8.1* 8.2* 8.7*  MG 1.5* 1.8 2.1  --  2.0  --   --   PHOS 3.2  --   --   --   --   --   --    GFR: Estimated Creatinine Clearance: 30.2 mL/min (A) (by C-G formula based on SCr of 1.51 mg/dL (H)). Liver Function Tests: Recent Labs  Lab 06/04/17 0700  AST 25  ALT 15  ALKPHOS 112  BILITOT 1.9*  PROT 7.4  ALBUMIN 2.4*   No results for input(s): LIPASE, AMYLASE in the last 168 hours. No results for input(s): AMMONIA in the last 168 hours. Coagulation Profile: No results for input(s): INR, PROTIME in the last 168 hours. Cardiac Enzymes: No results for input(s): CKTOTAL, CKMB, CKMBINDEX, TROPONINI in the last 168 hours. BNP (last 3 results) No results for input(s): PROBNP in the last 8760 hours. HbA1C: No results for input(s): HGBA1C in the last 72 hours. CBG: Recent Labs  Lab 06/09/17 0727 06/09/17 1120 06/09/17 1636 06/09/17 2039 06/10/17 0731  GLUCAP 324* 339* 272* 334* 379*   Lipid Profile: No results for input(s): CHOL, HDL, LDLCALC, TRIG, CHOLHDL, LDLDIRECT in the last 72 hours. Thyroid Function Tests: No results for input(s): TSH, T4TOTAL, FREET4, T3FREE, THYROIDAB in the last 72 hours. Anemia Panel: No results for input(s): VITAMINB12, FOLATE, FERRITIN, TIBC, IRON, RETICCTPCT in the last 72 hours. Urine analysis:    Component Value Date/Time   COLORURINE YELLOW 01/04/2015 1620   APPEARANCEUR CLEAR 01/04/2015 1620   LABSPEC 1.017 01/04/2015 1620   PHURINE 5.0 01/04/2015 1620   GLUCOSEU NEGATIVE 01/04/2015 1620   HGBUR TRACE (A) 01/04/2015 1620   BILIRUBINUR NEGATIVE 01/04/2015 1620   BILIRUBINUR negative 08/05/2012 1435   KETONESUR NEGATIVE 01/04/2015 1620   PROTEINUR NEGATIVE 01/04/2015 1620   UROBILINOGEN 0.2 01/04/2015 1620   NITRITE NEGATIVE  01/04/2015 1620   LEUKOCYTESUR TRACE (A) 01/04/2015 1620   Sepsis Labs: @LABRCNTIP (procalcitonin:4,lacticidven:4)  ) Recent Results (from the past 240 hour(s))  Culture, blood (routine x 2)     Status: None   Collection Time: 06/02/17 11:31 AM  Result Value Ref Range Status   Specimen Description BLOOD RIGHT ANTECUBITAL  Final   Special Requests   Final    BOTTLES DRAWN AEROBIC AND ANAEROBIC Blood Culture adequate volume   Culture   Final    NO GROWTH 5 DAYS Performed at [facility], [place] [address] [place], [place] [address]    Report Status 06/07/2017 FINAL  Final  Respiratory Panel by PCR     Status: None   Collection Time: 06/03/17 10:49 AM  Result Value Ref  Range Status   Adenovirus NOT DETECTED NOT DETECTED Final   Coronavirus 229E NOT DETECTED NOT DETECTED Final   Coronavirus HKU1 NOT DETECTED NOT DETECTED Final   Coronavirus NL63 NOT DETECTED NOT DETECTED Final   Coronavirus OC43 NOT DETECTED NOT DETECTED Final   Metapneumovirus NOT DETECTED NOT DETECTED Final   Rhinovirus / Enterovirus NOT DETECTED NOT DETECTED Final   Influenza A NOT DETECTED NOT DETECTED Final   Influenza B NOT DETECTED NOT DETECTED Final   Parainfluenza Virus 1 NOT DETECTED NOT DETECTED Final   Parainfluenza Virus 2 NOT DETECTED NOT DETECTED Final   Parainfluenza Virus 3 NOT DETECTED NOT DETECTED Final   Parainfluenza Virus 4 NOT DETECTED NOT DETECTED Final   Respiratory Syncytial Virus NOT DETECTED NOT DETECTED Final   Bordetella pertussis NOT DETECTED NOT DETECTED Final   Chlamydophila pneumoniae NOT DETECTED NOT DETECTED Final   Mycoplasma pneumoniae NOT DETECTED NOT DETECTED Final    Comment: Performed at [facility], [place] [address] [place], [place] [address]  MRSA PCR Screening     Status: None   Collection Time: 06/05/17 12:01 AM  Result Value Ref Range Status   MRSA by PCR NEGATIVE NEGATIVE Final    Comment:        The GeneXpert MRSA Assay (FDA approved for NASAL  specimens only), is one component of a comprehensive MRSA colonization surveillance program. It is not intended to diagnose MRSA infection nor to guide or monitor treatment for MRSA infections. Performed at [facility], [place] [address], [place],  [address]       Radiology Studies: Dg Chest [place] 1 View  Result Date: 06/10/2017 CLINICAL DATA:  77 year old female with shortness of breath. Amiodarone neurotoxicity. Subsequent encounter. EXAM: PORTABLE CHEST 1 VIEW COMPARISON:  06/09/2017 and 03/28/2014 chest x-ray. 06/06/2017 chest CT. FINDINGS: Similar appearance of asymmetric diffuse airspace disease. There may be a component of underlying chronic lung changes from amiodarone toxicity however, superimposed infectious infiltrate/pulmonary edema not excluded in the proper clinical setting. This represents a distinct change from 03/28/2014. After treatment of any clinically suspected infectious infiltrate or pulmonary edema, patient would benefit from follow-up chest x-ray to establish baseline. Cardiomegaly with sequential pacemaker in place.  No pneumothorax. Calcified aorta. IMPRESSION: Similar appearance to recent chest x-ray. Question asymmetric pulmonary edema/infiltrates superimposed upon chronic changes as detailed above. Electronically Signed   By: Genia Del M.D.   On: 06/10/2017 08:32   Dg Chest Port 1 View  Result Date: 06/09/2017 CLINICAL DATA:  Cough for several months.  Amiodarone toxicity EXAM: PORTABLE CHEST 1 VIEW COMPARISON:  Chest radiograph June 05, 2017 and chest CT June 06, 2017 FINDINGS: There is extensive pulmonary fibrotic type change. Patchy consolidation is noted in the right upper lobe and left lower lobe regions, stable. No new opacity evident. There is cardiomegaly with pulmonary vascularity within normal limits. There is aortic atherosclerosis. Pacemaker leads are attached to the right atrium and right ventricle. No bone lesions. IMPRESSION:  Underlying pulmonary fibrosis, extensive. Areas of patchy consolidation in the right upper lobe and left lower lobe regions. No new opacity. Stable cardiomegaly. There is aortic atherosclerosis. Aortic Atherosclerosis (ICD10-I70.0). Electronically Signed   By: Lowella Grip III M.D.   On: 06/09/2017 07:40     Scheduled Meds: . atorvastatin  20 mg Oral Daily  . furosemide  40 mg Intravenous Q12H  . guaiFENesin  1,200 mg Oral BID  . insulin aspart  0-15 Units Subcutaneous TID WC  . insulin  aspart  0-5 Units Subcutaneous QHS  . insulin glargine  15 Units Subcutaneous Daily  . levothyroxine  44 mcg Intravenous Daily  . mouth rinse  15 mL Mouth Rinse BID  . methylPREDNISolone (SOLU-MEDROL) injection  125 mg Intravenous Q6H  . metoprolol tartrate  12.5 mg Oral BID   Continuous Infusions: . heparin 1,150 Units/hr (06/09/17 2050)  . levofloxacin (LEVAQUIN) IV 500 mg (06/09/17 1739)     LOS: 8 days   Time Spent in minutes   30 minutes  Vivian Neuwirth D.O. on 06/10/2017 at 12:01 PM  Between 7am to 7pm - Pager - [phone]  After 7pm go to [email or web address] - password [email or web address]  And look for the night coverage person covering for me after hours  [facility] Office  [phone]

## 2017-06-10 NOTE — Progress Notes (Signed)
Progress Note  Patient Name: Becky Forbes Date of Encounter: 06/10/2017  Primary Cardiologist: No primary care provider on file.   Subjective   States her breathing has improved and has not required bipap since yesterday afternoon.  Denies chest pain or heart palpitations.  Inpatient Medications    Scheduled Meds: . atorvastatin  20 mg Oral Daily  . furosemide  40 mg Intravenous Q12H  . guaiFENesin  1,200 mg Oral BID  . insulin aspart  0-15 Units Subcutaneous TID WC  . insulin aspart  0-5 Units Subcutaneous QHS  . insulin glargine  15 Units Subcutaneous Daily  . levothyroxine  44 mcg Intravenous Daily  . mouth rinse  15 mL Mouth Rinse BID  . methylPREDNISolone (SOLU-MEDROL) injection  125 mg Intravenous Q6H  . metoprolol tartrate  2.5 mg Intravenous Q6H   Continuous Infusions: . heparin 1,150 Units/hr (06/09/17 2050)  . levofloxacin (LEVAQUIN) IV 500 mg (06/09/17 1739)   PRN Meds: acetaminophen, ipratropium, levalbuterol, ondansetron **OR** ondansetron (ZOFRAN) IV, polyethylene glycol, sodium chloride, traZODone   Vital Signs    Vitals:   06/10/17 0019 06/10/17 0137 06/10/17 0641 06/10/17 0731  BP: (!) 142/71  (!) 133/92 129/77  Pulse: 88  90 86  Resp: 20  16 18   Temp: 97.6 F (36.4 C)  97.6 F (36.4 C) (!) 97.5 F (36.4 C)  TempSrc:    Axillary  SpO2: 94% 97% 95% 90%  Weight:   71.2 kg (156 lb 15.5 oz)   Height:        Intake/Output Summary (Last 24 hours) at 06/10/2017 0831 Last data filed at 06/10/2017 0500 Gross per 24 hour  Intake 787.63 ml  Output 2075 ml  Net -1287.37 ml   Filed Weights   06/08/17 0317 06/09/17 0357 06/10/17 0641  Weight: 70.1 kg (154 lb 8.7 oz) 71.3 kg (157 lb 3 oz) 71.2 kg (156 lb 15.5 oz)    Telemetry    PVCs, atrial fibrillation - Personally Reviewed  ECG    No new tracings - Personally Reviewed  Physical Exam   GEN: No acute distress.   Neck: No JVD Cardiac: Irregularly Irregular, no murmurs, rubs, or gallops.    Respiratory: Diffuse crackles throughout all lung fields GI: Soft, nontender, non-distended  MS: No edema; No deformity. Neuro:  Nonfocal  Psych: Normal affect   Labs    Chemistry Recent Labs  Lab 06/04/17 0700  06/07/17 0323 06/08/17 0440 06/09/17 0438  NA 133*   < > 137 134* 137  K 3.7   < > 4.1 3.5 3.3*  CL 96*   < > 94* 93* 95*  CO2 22   < > 30 27 28   GLUCOSE 154*   < > 244* 312* 333*  BUN 33*   < > 56* 71* 74*  CREATININE 1.95*   < > 1.72* 1.72* 1.57*  CALCIUM 8.2*   < > 8.7* 8.1* 8.2*  PROT 7.4  --   --   --   --   ALBUMIN 2.4*  --   --   --   --   AST 25  --   --   --   --   ALT 15  --   --   --   --   ALKPHOS 112  --   --   --   --   BILITOT 1.9*  --   --   --   --   GFRNONAA 24*   < > 27* 27* 31*  GFRAA 27*   < > 32* 32* 36*  ANIONGAP 15   < > 13 14 14    < > = values in this interval not displayed.     Hematology Recent Labs  Lab 06/08/17 0440 06/09/17 0438 06/10/17 0332  WBC 18.9* 17.0* 17.5*  RBC 3.63* 3.63* 3.86*  HGB 10.7* 10.6* 11.1*  HCT 32.9* 32.8* 34.8*  MCV 90.6 90.4 90.2  MCH 29.5 29.2 28.8  MCHC 32.5 32.3 31.9  RDW 14.9 14.9 15.0  PLT 381 337 341    Cardiac EnzymesNo results for input(s): TROPONINI in the last 168 hours.  No results for input(s): TROPIPOC in the last 168 hours.   BNP No results for input(s): BNP, PROBNP in the last 168 hours.   DDimer  Recent Labs  Lab 06/05/17 0730  DDIMER 2.43*     Radiology    Dg Chest Port 1 View  Result Date: 06/09/2017 CLINICAL DATA:  Cough for several months.  Amiodarone toxicity EXAM: PORTABLE CHEST 1 VIEW COMPARISON:  Chest radiograph June 05, 2017 and chest CT June 06, 2017 FINDINGS: There is extensive pulmonary fibrotic type change. Patchy consolidation is noted in the right upper lobe and left lower lobe regions, stable. No new opacity evident. There is cardiomegaly with pulmonary vascularity within normal limits. There is aortic atherosclerosis. Pacemaker leads are  attached to the right atrium and right ventricle. No bone lesions. IMPRESSION: Underlying pulmonary fibrosis, extensive. Areas of patchy consolidation in the right upper lobe and left lower lobe regions. No new opacity. Stable cardiomegaly. There is aortic atherosclerosis. Aortic Atherosclerosis (ICD10-I70.0). Electronically Signed   By: Lowella Grip III M.D.   On: 06/09/2017 07:40    Cardiac Studies   Echocardiogram 06/03/2017: Study Conclusions  - Left ventricle: The cavity size was mildly dilated. Systolic function was moderately to severely reduced. The estimated ejection fraction was in the range of 30% to 35%. Diffuse hypokinesis. - Aortic valve: Noncoronary cusp mobility was restricted. Transvalvular velocity was within the normal range. There was no stenosis. There was no regurgitation. - Mitral valve: Mildly calcified annulus. Transvalvular velocity was within the normal range. There was no evidence for stenosis. There was mild to moderate regurgitation. - Left atrium: The atrium was severely dilated. - Right ventricle: The cavity size was normal. Wall thickness was normal. Systolic function was normal. - Right atrium: The atrium was severely dilated. - Atrial septum: No defect or patent foramen ovale was identified by color flow Doppler. - Tricuspid valve: There was mild regurgitation. - Pulmonary arteries: Systolic pressure was within the normal range. PA peak pressure: 27 mm Hg (S).  Impressions:  - Compared with the echo 23/53/61, systolic function is reduced.  Patient Profile     77 y.o. female 77 y.o. female with persistent atrial fibrillation, nonischemic cardiomyopathy, sick sinus syndrome status post pacemaker, previous GI bleed, and CKD stage 3.  She is being managed for hypoxic respiratory failure with parenchymal lung disease/groundglass opacities, question of toxicity related to amiodarone or bromocriptine, both of which have been  discontinued.  She is being followed by the Pulmonary service.   Assessment & Plan    1. Persistent atrial fibrillation On IV heparin.   Not requiring bipap as frequently.  On low dose of IV metoprolol tartrate. Rates controlled. Will likely transition to oral home regimen today.  2. Nonischemic Cardiomyopathy TTE this admission showed LVEF 30-35%.  Output of -5L this admission with weights down 6lb. Continuing to diurese with IV lasix 40 mg  daily.  Would benefit from outpatient ischemic evaluation at some point after recovers from acute illness.  3.  CKD stage III creatinine stable  4.  Hypoxic respiratory failure On HFNC with intermittent use of BiPAP.  Management per Pulmonary service.  Will likely need bronchoscopy at some point after supplemental O2 requirements decrease.  5. Hypokalemia Bmet pending   For questions or updates, please contact [place] Please consult [email or web address] for contact info under Cardiology/STEMI.      Signed, Boyd Kerbs, DO  06/10/2017, 8:31 AM

## 2017-06-10 NOTE — Progress Notes (Signed)
Paged MD Mikhail with elevated CBG 427; gave sliding scale 15 units. Verbal for 7 units of Lantus. I will continue to monitor the patient closely.   Saddie Benders RN

## 2017-06-10 NOTE — Progress Notes (Signed)
Inpatient Diabetes Program Recommendations  AACE/ADA: New Consensus Statement on Inpatient Glycemic Control (2015)  Target Ranges:  Prepandial:   less than 140 mg/dL      Peak postprandial:   less than 180 mg/dL (1-2 hours)      Critically ill patients:  140 - 180 mg/dL   Lab Results  Component Value Date   GLUCAP 379 (H) 06/10/2017   HGBA1C 7.7 (H) 06/04/2017    Review of Glycemic ControlResults for RIANN, OMAN (MRN [id number]) as of 06/10/2017 11:11  Ref. Range 06/09/2017 07:27 06/09/2017 11:20 06/09/2017 16:36 06/09/2017 20:39 06/10/2017 07:31  Glucose-Capillary Latest Ref Range: 65 - 99 mg/dL 324 (H) 339 (H) 272 (H) 334 (H) 379 (H)    Diabetes history: Type 2 DM Outpatient Diabetes medications:  Januvia 50 mg daily, Prandin 3 mg breakfast, 3 mg lunch, and 1 mg with supper Current orders for Inpatient glycemic control:  Lantus 15 units daily, Novolog moderate tid with meals and HS Solumedrol 125 mg IV q 6 hours Inpatient Diabetes Program Recommendations:    Please consider increasing Lantus to 22 units daily and adding Novolog meal coverage 4 units tid with meals.   Also consider increasing Novolog correction to q 4 hours while on IV steroids.   Thanks,  Adah Perl, RN, BC-ADM Inpatient Diabetes Coordinator Pager [phone] (8a-5p)

## 2017-06-10 NOTE — Progress Notes (Signed)
Name: Becky Forbes MRN: [id number] DOB: 1940/10/08    ADMISSION DATE:  06/02/2017 CONSULTATION DATE:  06/06/17  REFERRING MD :  Dr. 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 on 2/18 with reports of shortness of breath.    The patient reports she has had decreased activity tolerance since January 2019 and shortness of breath.  She reports she thinks she has had some weight loss.  She denies swelling in her lower extremities or pain.  She reports chest pressure at times.  She denies fevers, chills, nausea, vomiting, choking / difficulty swallowing, n/v/d, viral illness / sick contacts.  She went to [place] to shop with her sister the weekend before admit and after "just crashed".    She has been married for 65 years.  Her husband used to smoke but quit 40+ years ago.  She has had no known occupational exposures, wood heat, mold, birds / Curator.  The patient reports she has been on Amiodarone for approximately 3 years but was recently decreased to 3 times weekly (followed by Dr. Cathie Olden).      Since admit, she has been on BiPAP / increased O2 since admit.  Work up for PE is pending.  Despite high O2 needs her work of breathing has been normal.  Cardiology was consulted and she was diuresed 3.8L since admit.  ECHO revealed new reduction of LVEF to 30-35%.  She remains in AF with rate of 100-120's.  CT of the chest without contrast assessed which demonstrated diffuse bilateral ground glass.     PCCM consulted for pulmonary evaluation.     CULTURES RVP 2/19 >> negative  BCx2 2/18 >>   ANTIBIOTICS  Levaquin 2/19 >>     SIGNIFICANT EVENTS  2/19  Admit , RVP negative 2/22  PCCM consulted  AUTOIMMUNE (pre-steroids) HSP 2/22 >>  SSA 2/22 >> less than 0.2 SSB 2/22 >> less than 0.2 ANCA 2/22 >> neg RF 2/22 >> 11.8 ANA 2/22 >> neg CRP 2/22 >> 34.2 ESR 2/22 >> 125 2/22 CT Chest w/o >> Patchy consolidation in the  dependent right upper lobe, lingula and dependent right lower lobe. Extensive thickening of the peribronchovascular interstitium and extensive ground-glass attenuation throughout both lungs with associated mild traction bronchiolectasis and architectural distortion. Nonspecific mediastinal and bilateral hilar adenopathy, likely reactive.  Dilated main pulmonary artery, suggesting pulmonary arterialHypertension. Three-vessel coronary atherosclerosis.  Small hiatal hernia. 2/22 LE Doppler >> negative  2/22 - start solumedrol 140m IV q6h 2/23  45L/60% HFNC  2/24  40L/60% HFNC 2/25  - Feels a little better    SUBJECTIVE/OVERNIGHT/INTERVAL HX 2/26 - reported to triad MD that she is better and able to come off bipap for "longer". Did not use BiPAP overnight. Says has not used it for 2d because she is better and it does not have humidifer . Still needing 10L o2 andpulse ox 88%. Sat on side of bed  VITAL SIGNS: Temp:  [97.5 F (36.4 C)-98.2 F (36.8 C)] 97.5 F (36.4 C) (02/26 0731) Pulse Rate:  [86-91] 88 (02/26 1155) Resp:  [16-20] 18 (02/26 0731) BP: (120-142)/(71-92) 120/71 (02/26 1155) SpO2:  [90 %-97 %] 90 % (02/26 0731) Weight:  [71.2 kg (156 lb 15.5 oz)] 71.2 kg (156 lb 15.5 oz) (02/26 0641)  Intake/Output Summary (Last 24 hours) at 06/10/2017 1345 Last data filed at 06/10/2017 1100 Gross per 24 hour  Intake 628.13 ml  Output  2075 ml  Net -1446.87 ml   Filed Weights   06/08/17 0317 06/09/17 0357 06/10/17 0641  Weight: 70.1 kg (154 lb 8.7 oz) 71.3 kg (157 lb 3 oz) 71.2 kg (156 lb 15.5 oz)   PHYSICAL EXAMINATION:   General Appearance:    Looks chronic unwell. No distress. frail  Head:    Normocephalic, without obvious abnormality, atraumatic  Eyes:    PERRL - y3s, conjunctiva/corneas - clear      Ears:    Normal external ear canals, both ears  Nose:   NG tube - no but has Twilight  Throat:  ETT TUBE - no , OG tube - no  Neck:   Supple,  No enlargement/tenderness/nodules     Lungs:      Crackles without distress  Chest wall:    No deformity  Heart:    S1 and S2 normal, no murmur, CVP - no.  Pressors - no  Abdomen:     Soft, no masses, no organomegaly  Genitalia:    Not done  Rectal:   not done  Extremities:   Extremities- intact     Skin:   Intact in exposed areas .      Neurologic:  . Moves all 4s - yes. CAM-ICU - neg . Orientation - x3+      PULMONARY Recent Labs  Lab 06/04/17 1230  PHART 7.396  PCO2ART 41.2  PO2ART 88.7  HCO3 24.7  O2SAT 96.2    CBC Recent Labs  Lab 06/08/17 0440 06/09/17 0438 06/10/17 0332  HGB 10.7* 10.6* 11.1*  HCT 32.9* 32.8* 34.8*  WBC 18.9* 17.0* 17.5*  PLT 381 337 341    COAGULATION No results for input(s): INR in the last 168 hours.  CARDIAC  No results for input(s): TROPONINI in the last 168 hours. No results for input(s): PROBNP in the last 168 hours.   CHEMISTRY Recent Labs  Lab 06/04/17 0700 06/05/17 0438 06/06/17 0520 06/07/17 0323 06/08/17 0440 06/09/17 0438 06/10/17 0838  NA 133* 134* 140 137 134* 137 139  K 3.7 3.3* 3.8 4.1 3.5 3.3* 4.5  CL 96* 97* 99* 94* 93* 95* 96*  CO2 _0 GLUCOSE 154* 150* 156* 244* 312* 333* 424*  BUN 33* 36* 41* 56* 71* 74* 63*  CREATININE 1.95* 1.80* 1.60* 1.72* 1.72* 1.57* 1.51*  CALCIUM 8.2* 8.1* 8.7* 8.7* 8.1* 8.2* 8.7*  MG 1.5* 1.8 2.1  --  2.0  --   --   PHOS 3.2  --   --   --   --   --   --    Estimated Creatinine Clearance: 30.2 mL/min (A) (by C-G formula based on SCr of 1.51 mg/dL (H)).   LIVER Recent Labs  Lab 06/04/17 0700  AST 25  ALT 15  ALKPHOS 112  BILITOT 1.9*  PROT 7.4  ALBUMIN 2.4*     INFECTIOUS No results for input(s): LATICACIDVEN, PROCALCITON in the last 168 hours.   ENDOCRINE CBG (last 3)  Recent Labs    06/09/17 2039 06/10/17 0731 06/10/17 1227  GLUCAP 334* 379* 427*         IMAGING x48h  - image(s) personally visualized  -   highlighted in bold Dg Chest Port 1 View  Result Date:  06/10/2017 CLINICAL DATA:  77 year old female with shortness of breath. Amiodarone neurotoxicity. Subsequent encounter. EXAM: PORTABLE CHEST 1 VIEW COMPARISON:  06/09/2017 and 03/28/2014 chest x-ray. 06/06/2017 chest CT. FINDINGS: Similar appearance of asymmetric diffuse  airspace disease. There may be a component of underlying chronic lung changes from amiodarone toxicity however, superimposed infectious infiltrate/pulmonary edema not excluded in the proper clinical setting. This represents a distinct change from 03/28/2014. After treatment of any clinically suspected infectious infiltrate or pulmonary edema, patient would benefit from follow-up chest x-ray to establish baseline. Cardiomegaly with sequential pacemaker in place.  No pneumothorax. Calcified aorta. IMPRESSION: Similar appearance to recent chest x-ray. Question asymmetric pulmonary edema/infiltrates superimposed upon chronic changes as detailed above. Electronically Signed   By: Genia Del M.D.   On: 06/10/2017 08:32   Dg Chest Port 1 View  Result Date: 06/09/2017 CLINICAL DATA:  Cough for several months.  Amiodarone toxicity EXAM: PORTABLE CHEST 1 VIEW COMPARISON:  Chest radiograph June 05, 2017 and chest CT June 06, 2017 FINDINGS: There is extensive pulmonary fibrotic type change. Patchy consolidation is noted in the right upper lobe and left lower lobe regions, stable. No new opacity evident. There is cardiomegaly with pulmonary vascularity within normal limits. There is aortic atherosclerosis. Pacemaker leads are attached to the right atrium and right ventricle. No bone lesions. IMPRESSION: Underlying pulmonary fibrosis, extensive. Areas of patchy consolidation in the right upper lobe and left lower lobe regions. No new opacity. Stable cardiomegaly. There is aortic atherosclerosis. Aortic Atherosclerosis (ICD10-I70.0). Electronically Signed   By: Lowella Grip III M.D.   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  She has a hx of chronic combined CHF and has been diuresed 3.8L and is clinically euvolemic.  Working w/ DDx includes amiodarone lung toxicity (she is also on bromocriptine which can be associated with pulmonary fibrosis), autoimmune pulmonary manifestation, cardiogenic pulmonary edema & hypersensitivity pneumonitis. -autoimmune panel neg -pcxr personally reviewed: showing improvement of RUL airspace disease and no sig change LLL.  -   06/10/2017 - >  Acute Respiratory Failure due to ALI due to amio tox/boop patter. Slowly better. But still long way (weeks to months) to go   Plan: Hold amiodarone and bromocriptine indefinitely  Wean oxygen  Do biPAP qhs to help resp muscle fatigue Now day # 4 solumedrol 125 mg q 6; will initiate taper in  06/12/17 lasix to bid  No role for bronch FIO2 requirements are too high and if continues to improve would not be worth give potential negative yield.   Consider LTAC  She , daugher and husband updated     Dr. Brand Males, M.D., [facility].C.P Pulmonary and Critical Care Medicine Staff Physician, [place] Director - Interstitial Lung Disease  Program  Pulmonary [place] at [place], [place], [address]  Pager: [phone], If no answer or between  15:00h - 7:00h: call [phone] Telephone: [phone]

## 2017-06-10 NOTE — Progress Notes (Signed)
ANTICOAGULATION CONSULT NOTE - Follow Up Consult  Pharmacy Consult for Heparin (Eliquis on hold) Indication: atrial fibrillation  Patient Measurements: Height: 5\' 4"  (162.6 cm) Weight: 156 lb 15.5 oz (71.2 kg) IBW/kg (Calculated) : 54.7 Heparin Dosing Weight: 71 kg  Vital Signs: Temp: 97.5 F (36.4 C) (02/26 0731) Temp Source: Axillary (02/26 0731) BP: 120/71 (02/26 1155) Pulse Rate: 88 (02/26 1155)  Labs: Recent Labs    06/08/17 0440 06/09/17 0438 06/10/17 0332 06/10/17 0838  HGB 10.7* 10.6* 11.1*  --   HCT 32.9* 32.8* 34.8*  --   PLT 381 337 341  --   APTT 83* 95* 68*  --   HEPARINUNFRC 1.02* 0.87* 0.52  --   CREATININE 1.72* 1.57*  --  1.51*    Estimated Creatinine Clearance: 30.2 mL/min (A) (by C-G formula based on SCr of 1.51 mg/dL (H)).  Assessment:  77 year old female on Eliquis PTA for Afib, last dose 2/20 at 10 pm, continues on IV heparin for Afib. Heparin levels have been falsely elevated due to recent Eliquis doses, but aPTT remains therapeutic (68 seconds) on 1150 units/hr. Heparin level is now also therapeutic (0.52), seems to correlating with aPTTs.   Goal of Therapy:  Heparin level 0.3-0.7 units/ml aPTT 66-102 seconds Monitor platelets by anticoagulation protocol: Yes   Plan:   Continue heparin drip at 1150 units/hr.  Daily heparin level, aPTT and CBC.  Eliquis on hold. Follow up for restart when able.  Arty Baumgartner, [place] Pager: [phone] or [phone] 06/10/2017,12:12 PM

## 2017-06-11 DIAGNOSIS — T462X1D Poisoning by other antidysrhythmic drugs, accidental (unintentional), subsequent encounter: Secondary | ICD-10-CM

## 2017-06-11 LAB — HYPERSENSITIVITY PNEUMONITIS
A. Pullulans Abs: NEGATIVE
A.Fumigatus #1 Abs: NEGATIVE
Micropolyspora faeni, IgG: NEGATIVE
Pigeon Serum Abs: NEGATIVE
Thermoact. Saccharii: NEGATIVE
Thermoactinomyces vulgaris, IgG: NEGATIVE

## 2017-06-11 LAB — HEPARIN LEVEL (UNFRACTIONATED): Heparin Unfractionated: 0.42 IU/mL (ref 0.30–0.70)

## 2017-06-11 LAB — BASIC METABOLIC PANEL
ANION GAP: 11 (ref 5–15)
BUN: 59 mg/dL — AB (ref 6–20)
CHLORIDE: 95 mmol/L — AB (ref 101–111)
CO2: 33 mmol/L — ABNORMAL HIGH (ref 22–32)
Calcium: 8.7 mg/dL — ABNORMAL LOW (ref 8.9–10.3)
Creatinine, Ser: 1.41 mg/dL — ABNORMAL HIGH (ref 0.44–1.00)
GFR calc Af Amer: 40 mL/min — ABNORMAL LOW (ref >60)
GFR calc non Af Amer: 35 mL/min — ABNORMAL LOW (ref >60)
GLUCOSE: 299 mg/dL — AB (ref 65–99)
POTASSIUM: 3.8 mmol/L (ref 3.5–5.1)
SODIUM: 139 mmol/L (ref 135–145)

## 2017-06-11 LAB — APTT: aPTT: 73 seconds — ABNORMAL HIGH (ref 24–36)

## 2017-06-11 LAB — GLUCOSE, CAPILLARY
GLUCOSE-CAPILLARY: 343 mg/dL — AB (ref 65–99)
GLUCOSE-CAPILLARY: 228 mg/dL — AB (ref 65–99)
Glucose-Capillary: 396 mg/dL — ABNORMAL HIGH (ref 65–99)
Glucose-Capillary: 211 mg/dL — ABNORMAL HIGH (ref 65–99)

## 2017-06-11 LAB — CBC
HEMATOCRIT: 35.6 % — AB (ref 36.0–46.0)
HEMOGLOBIN: 11.1 g/dL — AB (ref 12.0–15.0)
MCH: 28.5 pg (ref 26.0–34.0)
MCHC: 31.2 g/dL (ref 30.0–36.0)
MCV: 91.3 fL (ref 78.0–100.0)
Platelets: 321 10*3/uL (ref 150–400)
RBC: 3.9 MIL/uL (ref 3.87–5.11)
RDW: 15.2 % (ref 11.5–15.5)
WBC: 20 10*3/uL — ABNORMAL HIGH (ref 4.0–10.5)

## 2017-06-11 MED ORDER — INSULIN ASPART 100 UNIT/ML ~~LOC~~ SOLN
4.0000 [IU] | Freq: Three times a day (TID) | SUBCUTANEOUS | Status: DC
  Administered 2017-06-11 – 2017-06-13 (×8): 4 [IU] via SUBCUTANEOUS

## 2017-06-11 MED ORDER — INSULIN ASPART 100 UNIT/ML ~~LOC~~ SOLN
0.0000 [IU] | SUBCUTANEOUS | Status: DC
  Administered 2017-06-11 (×2): 5 [IU] via SUBCUTANEOUS
  Administered 2017-06-12: 3 [IU] via SUBCUTANEOUS
  Administered 2017-06-12: 5 [IU] via SUBCUTANEOUS
  Administered 2017-06-12: 3 [IU] via SUBCUTANEOUS
  Administered 2017-06-12: 5 [IU] via SUBCUTANEOUS
  Administered 2017-06-12 – 2017-06-13 (×4): 3 [IU] via SUBCUTANEOUS
  Administered 2017-06-13: 5 [IU] via SUBCUTANEOUS

## 2017-06-11 MED ORDER — INSULIN GLARGINE 100 UNIT/ML ~~LOC~~ SOLN
30.0000 [IU] | Freq: Every day | SUBCUTANEOUS | Status: DC
  Administered 2017-06-12 – 2017-06-13 (×2): 30 [IU] via SUBCUTANEOUS
  Filled 2017-06-11 (×2): qty 0.3

## 2017-06-11 MED ORDER — FUROSEMIDE 40 MG PO TABS
40.0000 mg | ORAL_TABLET | Freq: Two times a day (BID) | ORAL | Status: DC
  Administered 2017-06-11 – 2017-06-13 (×4): 40 mg via ORAL
  Filled 2017-06-11 (×4): qty 1

## 2017-06-11 NOTE — Progress Notes (Signed)
Progress Note  Patient Name: Becky Forbes Date of Encounter: 06/11/2017  Primary Cardiologist: No primary care provider on file.   Subjective   States her breathing continues to improve.  Uses Bipap at night and not during the day.  Inpatient Medications    Scheduled Meds: . atorvastatin  20 mg Oral Daily  . furosemide  40 mg Intravenous Q12H  . guaiFENesin  1,200 mg Oral BID  . insulin aspart  0-15 Units Subcutaneous TID WC  . insulin aspart  0-5 Units Subcutaneous QHS  . insulin aspart  4 Units Subcutaneous TID WC  . insulin glargine  22 Units Subcutaneous Daily  . levothyroxine  44 mcg Intravenous Daily  . mouth rinse  15 mL Mouth Rinse BID  . methylPREDNISolone (SOLU-MEDROL) injection  125 mg Intravenous Q6H  . metoprolol tartrate  12.5 mg Oral BID   Continuous Infusions: . heparin 1,150 Units/hr (06/09/17 2050)  . levofloxacin (LEVAQUIN) IV Stopped (06/10/17 1930)   PRN Meds: acetaminophen, ipratropium, levalbuterol, metoprolol tartrate, ondansetron **OR** ondansetron (ZOFRAN) IV, polyethylene glycol, sodium chloride, traZODone   Vital Signs    Vitals:   06/11/17 0500 06/11/17 0501 06/11/17 0615 06/11/17 0815  BP:  (!) 147/78  120/61  Pulse:  80 66   Resp:  17 12   Temp:  97.6 F (36.4 C)  (!) 97.5 F (36.4 C)  TempSrc:    Oral  SpO2:  92% 92% 90%  Weight: 70.6 kg (155 lb 10.3 oz)     Height:        Intake/Output Summary (Last 24 hours) at 06/11/2017 0830 Last data filed at 06/11/2017 0500 Gross per 24 hour  Intake 997.5 ml  Output 2850 ml  Net -1852.5 ml   Filed Weights   06/09/17 0357 06/10/17 0641 06/11/17 0500  Weight: 71.3 kg (157 lb 3 oz) 71.2 kg (156 lb 15.5 oz) 70.6 kg (155 lb 10.3 oz)    Telemetry    PVCs, atrial fibrillation - Personally Reviewed  ECG    No new tracings - Personally Reviewed  Physical Exam   GEN: No acute distress.   Neck: No JVD Cardiac: Irregularly Irregular, no murmurs, rubs, or gallops.  Respiratory:  Diffuse crackles throughout all lung fields GI: Soft, nontender, non-distended  MS: No edema; No deformity. Neuro:  Nonfocal  Psych: Normal affect   Labs    Chemistry Recent Labs  Lab 06/09/17 0438 06/10/17 0838 06/11/17 0403  NA 137 139 139  K 3.3* 4.5 3.8  CL 95* 96* 95*  CO2 28 29 33*  GLUCOSE 333* 424* 299*  BUN 74* 63* 59*  CREATININE 1.57* 1.51* 1.41*  CALCIUM 8.2* 8.7* 8.7*  GFRNONAA 31* 32* 35*  GFRAA 36* 37* 40*  ANIONGAP 14 14 11      Hematology Recent Labs  Lab 06/09/17 0438 06/10/17 0332 06/11/17 0403  WBC 17.0* 17.5* 20.0*  RBC 3.63* 3.86* 3.90  HGB 10.6* 11.1* 11.1*  HCT 32.8* 34.8* 35.6*  MCV 90.4 90.2 91.3  MCH 29.2 28.8 28.5  MCHC 32.3 31.9 31.2  RDW 14.9 15.0 15.2  PLT 337 341 321    Cardiac EnzymesNo results for input(s): TROPONINI in the last 168 hours.  No results for input(s): TROPIPOC in the last 168 hours.   BNP No results for input(s): BNP, PROBNP in the last 168 hours.   DDimer  Recent Labs  Lab 06/05/17 0730  DDIMER 2.43*     Radiology    Dg Chest [facility]  Result Date: 06/10/2017 CLINICAL DATA:  77 year old female with shortness of breath. Amiodarone neurotoxicity. Subsequent encounter. EXAM: PORTABLE CHEST 1 VIEW COMPARISON:  06/09/2017 and 03/28/2014 chest x-ray. 06/06/2017 chest CT. FINDINGS: Similar appearance of asymmetric diffuse airspace disease. There may be a component of underlying chronic lung changes from amiodarone toxicity however, superimposed infectious infiltrate/pulmonary edema not excluded in the proper clinical setting. This represents a distinct change from 03/28/2014. After treatment of any clinically suspected infectious infiltrate or pulmonary edema, patient would benefit from follow-up chest x-ray to establish baseline. Cardiomegaly with sequential pacemaker in place.  No pneumothorax. Calcified aorta. IMPRESSION: Similar appearance to recent chest x-ray. Question asymmetric pulmonary edema/infiltrates  superimposed upon chronic changes as detailed above. Electronically Signed   By: Genia Del M.D.   On: 06/10/2017 08:32    Cardiac Studies   Echocardiogram 06/03/2017: Study Conclusions  - Left ventricle: The cavity size was mildly dilated. Systolic function was moderately to severely reduced. The estimated ejection fraction was in the range of 30% to 35%. Diffuse hypokinesis. - Aortic valve: Noncoronary cusp mobility was restricted. Transvalvular velocity was within the normal range. There was no stenosis. There was no regurgitation. - Mitral valve: Mildly calcified annulus. Transvalvular velocity was within the normal range. There was no evidence for stenosis. There was mild to moderate regurgitation. - Left atrium: The atrium was severely dilated. - Right ventricle: The cavity size was normal. Wall thickness was normal. Systolic function was normal. - Right atrium: The atrium was severely dilated. - Atrial septum: No defect or patent foramen ovale was identified by color flow Doppler. - Tricuspid valve: There was mild regurgitation. - Pulmonary arteries: Systolic pressure was within the normal range. PA peak pressure: 27 mm Hg (S).  Impressions:  - Compared with the echo 98/33/82, systolic function is reduced.  Patient Profile     77 y.o. female 77 y.o. female with persistent atrial fibrillation, nonischemic cardiomyopathy, sick sinus syndrome status post pacemaker, previous GI bleed, and CKD stage 3.  She is being managed for hypoxic respiratory failure with parenchymal lung disease/groundglass opacities, question of toxicity related to amiodarone or bromocriptine, both of which have been discontinued.  She is being followed by the Pulmonary service.   Assessment & Plan    1. Persistent atrial fibrillation On IV heparin.   bipap qhs.  Switched to oral metoprolol yesterday. Rates controlled.   2. Nonischemic Cardiomyopathy TTE this admission  showed LVEF 30-35%.  Output of -7L this admission with weights down 7lb. Baseline weight hard to tell since patient has been loosing weight.  Has been diuresing with IV lasix 40 mg daily.  Following bmets.  Would benefit from outpatient ischemic evaluation at some point after recovers from acute illness.  3.  CKD stage III creatinine stable, continuing to improve  4.  Hypoxic respiratory failure Thought to be due to amiodarone toxicity.  On HFNC with BiPAP qhs.  Management per Pulmonary service.  Lung exam improved from yesterday.   5. Hypokalemia Resolved   For questions or updates, please contact [place] Please consult [email or web address] for contact info under Cardiology/STEMI.      Signed, Boyd Kerbs, DO  06/11/2017, 8:30 AM

## 2017-06-11 NOTE — Progress Notes (Signed)
ANTICOAGULATION CONSULT NOTE - Follow Up Consult  Pharmacy Consult for Heparin (Eliquis on hold) Indication: atrial fibrillation  Patient Measurements: Height: 5\' 4"  (162.6 cm) Weight: 155 lb 10.3 oz (70.6 kg) IBW/kg (Calculated) : 54.7 Heparin Dosing Weight: 71 kg  Vital Signs: Temp: 97.5 F (36.4 C) (02/27 0815) Temp Source: Oral (02/27 0815) BP: 120/61 (02/27 0844) Pulse Rate: 83 (02/27 0844)  Labs: Recent Labs    06/09/17 0438 06/10/17 0332 06/10/17 0838 06/11/17 0403  HGB 10.6* 11.1*  --  11.1*  HCT 32.8* 34.8*  --  35.6*  PLT 337 341  --  321  APTT 95* 68*  --  73*  HEPARINUNFRC 0.87* 0.52  --  0.42  CREATININE 1.57*  --  1.51* 1.41*    Estimated Creatinine Clearance: 32.2 mL/min (A) (by C-G formula based on SCr of 1.41 mg/dL (H)).  Assessment:  77 year old female on Eliquis PTA for Afib, last dose 2/20 at 10 pm, continues on IV heparin for Afib. Heparin levels have been falsely elevated due to recent Eliquis doses, but aPTT remains therapeutic (73 seconds) on 1150 units/hr. Heparin level also remains therapeutic (0.42), correlating with aPTTs, so will stop daily aPTTs.   Goal of Therapy:  Heparin level 0.3-0.7 units/ml aPTT 66-102 seconds Monitor platelets by anticoagulation protocol: Yes   Plan:   Continue heparin drip at 1150 units/hr.  Daily heparin level and CBC.  Eliquis on hold. Follow up for restart when able.  Arty Baumgartner, [place] Pager: [phone] or [phone] 06/11/2017,12:18 PM

## 2017-06-11 NOTE — Progress Notes (Addendum)
Name: Becky Forbes MRN: [id number] DOB: 12/26/1940    ADMISSION DATE:  06/02/2017 CONSULTATION DATE:  06/06/17  REFERRING MD :  Dr. 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    The patient reports she has had decreased activity tolerance since January 2019 and shortness of breath.  She reports she thinks she has had some weight loss.  She denies swelling in her lower extremities or pain.  She reports chest pressure at times.  She denies fevers, chills, nausea, vomiting, choking / difficulty swallowing, n/v/d, viral illness / sick contacts.  She went to [place] to shop with her sister the weekend before admit and after "just crashed".    She has been married for 19 years.  Her husband used to smoke but quit 40+ years ago.  She has had no known occupational exposures, wood heat, mold, birds / Curator.  The patient reports she has been on Amiodarone for approximately 3 years but was recently decreased to 3 times weekly (followed by Dr. Cathie Olden).      Since admit, she has been on BiPAP / increased O2 since admit.  Work up for PE is pending.  Despite high O2 needs her work of breathing has been normal.  Cardiology was consulted and she was diuresed 3.8L since admit.  ECHO revealed new reduction of LVEF to 30-35%.  She remains in AF with rate of 100-120's.  CT of the chest without contrast assessed which demonstrated diffuse bilateral ground glass.     PCCM consulted for pulmonary evaluation.     CULTURES RVP 2/19 >> negative  BCx2 2/18 >>   ANTIBIOTICS  Levaquin 2/19 >>     SIGNIFICANT EVENTS  2/19  Admit , RVP negative 2/22  PCCM consulted  AUTOIMMUNE (pre-steroids) HSP 2/22 >>  SSA 2/22 >> less than 0.2 SSB 2/22 >> less than 0.2 ANCA 2/22 >> neg RF 2/22 >> 11.8 ANA 2/22 >> neg CRP 2/22 >> 34.2 ESR 2/22 >> 125 2/22 CT Chest w/o >> Patchy consolidation in the  dependent right upper lobe, lingula and dependent right lower lobe. Extensive thickening of the peribronchovascular interstitium and extensive ground-glass attenuation throughout both lungs with associated mild traction bronchiolectasis and architectural distortion. Nonspecific mediastinal and bilateral hilar adenopathy, likely reactive.  Dilated main pulmonary artery, suggesting pulmonary arterialHypertension. Three-vessel coronary atherosclerosis.  Small hiatal hernia. 2/22 LE Doppler >> negative  2/22 - start solumedrol 117m IV q6h 2/23  45L/60% HFNC  2/24  40L/60% HFNC 2/25  - Feels a little better    SUBJECTIVE/OVERNIGHT/INTERVAL HX She continues to feel better still having high FiO2 requirements currently on 15 L high flow nasal oxygen but no longer on heated high flow does not like the BiPAP machine  VITAL SIGNS: Temp:  [97.5 F (36.4 C)-98.6 F (37 C)] 97.9 F (36.6 C) (02/27 1247) Pulse Rate:  [66-89] 83 (02/27 0844) Resp:  [12-23] 23 (02/27 0844) BP: (110-147)/(61-82) 110/82 (02/27 1247) SpO2:  [89 %-94 %] 89 % (02/27 1247) FiO2 (%):  [50 %-60 %] 55 % (02/27 0150) Weight:  [155 lb 10.3 oz (70.6 kg)] 155 lb 10.3 oz (70.6 kg) (02/27 0500)  Intake/Output Summary (Last 24 hours) at 06/11/2017 1317 Last data filed at 06/11/2017 1100 Gross per 24 hour  Intake 866 ml  Output 2050 ml  Net -1184 ml   FAutoliv  06/09/17 0357 06/10/17 0641 06/11/17 0500  Weight: 157 lb 3 oz (71.3 kg) 156 lb 15.5 oz (71.2 kg) 155 lb 10.3 oz (70.6 kg)   PHYSICAL EXAMINATION: General: Is a 77 year old white female currently resting comfortably in bed she is in no acute distress HEENT: Normocephalic atraumatic no jugular venous distention mucous membranes are moist Pulmonary: Dry crackles posteriorly no accessory use Cardiac: Regular rate and rhythm Abdomen: Soft nontender Extremities/muscular skeletal: Brisk cap refill, no edema, warm, dry. Neuro/psych: Awake oriented no focal  deficits    PULMONARY No results for input(s): PHART, PCO2ART, PO2ART, HCO3, TCO2, O2SAT in the last 168 hours.  Invalid input(s): PCO2, PO2  CBC Recent Labs  Lab 06/09/17 0438 06/10/17 0332 06/11/17 0403  HGB 10.6* 11.1* 11.1*  HCT 32.8* 34.8* 35.6*  WBC 17.0* 17.5* 20.0*  PLT 337 341 321    COAGULATION No results for input(s): INR in the last 168 hours.  CARDIAC  No results for input(s): TROPONINI in the last 168 hours. No results for input(s): PROBNP in the last 168 hours.   CHEMISTRY Recent Labs  Lab 06/05/17 7416 06/06/17 0520 06/07/17 0323 06/08/17 0440 06/09/17 0438 06/10/17 0838 06/11/17 0403  NA 134* 140 137 134* 137 139 139  K 3.3* 3.8 4.1 3.5 3.3* 4.5 3.8  CL 97* 99* 94* 93* 95* 96* 95*  CO2 24 27 30 27 28 29  33*  GLUCOSE 150* 156* 244* 312* 333* 424* 299*  BUN 36* 41* 56* 71* 74* 63* 59*  CREATININE 1.80* 1.60* 1.72* 1.72* 1.57* 1.51* 1.41*  CALCIUM 8.1* 8.7* 8.7* 8.1* 8.2* 8.7* 8.7*  MG 1.8 2.1  --  2.0  --   --   --    Estimated Creatinine Clearance: 32.2 mL/min (A) (by C-G formula based on SCr of 1.41 mg/dL (H)).   LIVER No results for input(s): AST, ALT, ALKPHOS, BILITOT, PROT, ALBUMIN, INR in the last 168 hours.   INFECTIOUS No results for input(s): LATICACIDVEN, PROCALCITON in the last 168 hours.   ENDOCRINE CBG (last 3)  Recent Labs    06/10/17 2004 06/11/17 0749 06/11/17 1118  GLUCAP 313* 396* 343*         IMAGING x48h  - image(s) personally visualized  -   highlighted in bold Dg Chest Port 1 View  Result Date: 06/10/2017 CLINICAL DATA:  77 year old female with shortness of breath. Amiodarone neurotoxicity. Subsequent encounter. EXAM: PORTABLE CHEST 1 VIEW COMPARISON:  06/09/2017 and 03/28/2014 chest x-ray. 06/06/2017 chest CT. FINDINGS: Similar appearance of asymmetric diffuse airspace disease. There may be a component of underlying chronic lung changes from amiodarone toxicity however, superimposed infectious  infiltrate/pulmonary edema not excluded in the proper clinical setting. This represents a distinct change from 03/28/2014. After treatment of any clinically suspected infectious infiltrate or pulmonary edema, patient would benefit from follow-up chest x-ray to establish baseline. Cardiomegaly with sequential pacemaker in place.  No pneumothorax. Calcified aorta. IMPRESSION: Similar appearance to recent chest x-ray. Question asymmetric pulmonary edema/infiltrates superimposed upon chronic changes as detailed above. Electronically Signed   By: Genia Del M.D.   On: 06/10/2017 08:32      ASSESSMENT / PLAN:   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  Acute Hypoxic Respiratory Failure - doubt PE given CT appearance and symptom onset  Hx Amiodarone Use  Insomnia on Steroids / At Risk Delirium   Discussion:  Acute Respiratory Failure due to ALI due to amio tox/boop patter. Slowly better. 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  She has a hx of chronic combined CHF and has been diuresed 3.8L and is clinically euvolemic.  Working w/ DDx includes amiodarone lung toxicity (she is also on bromocriptine which can be associated with pulmonary fibrosis), autoimmune pulmonary manifestation, cardiogenic pulmonary edema & hypersensitivity pneumonitis. -autoimmune panel neg  2/26 - reported to triad MD that she is better and able to come off bipap for "longer". Did not use BiPAP overnight. Says has not used it for 2d because she is better and it does not have humidifer . Still needing 10L o2 andpulse ox 88%. Sat on side of bed  06/11/2017 -continues to make gradual slow improvement.  She is currently on 15 L but this is improvement from 2 days ago when she was on 30 L heated high flow  Plan: Hold amiodarone and bromocriptine indef  Wean oxygen  BIPAP at HS  Repeat cxr in am  Day # 5 high dose  solumedrol w/ plan to initiate taper on 2/28 (will be slow taper.. Anticipate up to 3 mo).  Now day # 4 solumedrol 125 mg q 6; will initiate taper in  06/12/17 Cont lasix as BP/BUN/cr allow No bronch  Consider LTAC, her oxygen requirements are much too high for her to go to skilled nursing and I suspect it will take her some time before this improves   Erick Colace ACNP-BC [place] Pager # [phone] OR # [phone] if no answer

## 2017-06-11 NOTE — Clinical Social Work Note (Signed)
CSW acknowledges consult for [facility] placement. RNCM is aware.  CSW signing off. Consult again if any social work needs arise.  Dayton Scrape, [place]

## 2017-06-11 NOTE — Progress Notes (Signed)
Inpatient Diabetes Program Recommendations  AACE/ADA: New Consensus Statement on Inpatient Glycemic Control (2015)  Target Ranges:  Prepandial:   less than 140 mg/dL      Peak postprandial:   less than 180 mg/dL (1-2 hours)      Critically ill patients:  140 - 180 mg/dL   Lab Results  Component Value Date   GLUCAP 343 (H) 06/11/2017   HGBA1C 7.7 (H) 06/04/2017    Review of Glycemic ControlResults for CLELIA, TRABUCCO (MRN [id number]) as of 06/11/2017 15:32  Ref. Range 06/10/2017 12:27 06/10/2017 16:36 06/10/2017 20:04 06/11/2017 07:49 06/11/2017 11:18  Glucose-Capillary Latest Ref Range: 65 - 99 mg/dL 427 (H) 225 (H) 313 (H) 396 (H) 343 (H)   Diabetes history: Type 2 DM Outpatient Diabetes medications:  Januvia 50 mg daily, Prandin 3 mg breakfast, 3 mg lunch, and 1 mg with supper Current orders for Inpatient glycemic control:  Novolog moderate tid with meals, Lantus 22 units daily  Inpatient Diabetes Program Recommendations:   Please consider increasing Novolog correction to q 4 hours and increase Lantus to 30 units daily. Text page sent.   Thanks,  Adah Perl, RN, BC-ADM Inpatient Diabetes Coordinator Pager [phone] (8a-5p)

## 2017-06-11 NOTE — Progress Notes (Signed)
PROGRESS NOTE    Becky Forbes  XVQ:[id number] DOB: 09-04-1940 DOA: 06/02/2017 PCP: Binnie Rail, MD     Brief Narrative:  Becky Forbes a 77 y.o.femalewith medical history significant ofsick sinus syndrome with implanted pacemaker, paroxysmal atrial fibrillation, hypothyroidism, combined diastolic and systolic congestive heart failure, chronic kidney disease, hypothyroidism, diabetes mellitus who presented to the emergency department with complaints of worsening shortness of breath,cough productive of white sputum and chest tightness. Patient reported that since December she was battling cough and shortness of breath off and on and on Friday she was traveling to [place] to see his sister, had a lot of walking around and on the way back, her daughter noticed that patient developed frequent coughs and some shortness of breath. Since Friday, her condition worsened and eventually this night she could not sleep at all and had to come to the ED for evaluation. She denied PND, orthopnea, chest pain, diaphoresis, lower extremity swelling, abdominal pain,she complained of nausea and dry heaves, but no vomiting. She was admitted for acute respiratory failure with hypoxia.  Patient found to have a reduced EF, cardiology consulted and appreciated. Continues to be hypoxic and unable to wean off of bipap. Obtained CT chest w/o contrast which showed ARDS. PCCM consulted.   Assessment & Plan:   Principal Problem:   Acute respiratory failure (HCC) Active Problems:   HYPERLIPIDEMIA   PAF (paroxysmal atrial fibrillation) (HCC)   Chronic combined systolic and diastolic CHF (congestive heart failure) (HCC)   Paroxysmal A-fib (HCC)   Benign essential HTN   Hypothyroidism due to amiodarone   CKD (chronic kidney disease)   CAP (community acquired pneumonia)   Acute exacerbation of CHF (congestive heart failure) (HCC)   Sepsis ([place])   Acute on chronic systolic heart failure (HCC)   Persistent atrial  fibrillation (HCC)   Amiodarone toxicity   Acute respiratory failure with hypoxia  -Secondary to ALI due to amiodarone toxicity/BOOP pattern -DDimer 2.43. Have ordered a VQ scan however could not be done while on BiPAP. Lower extremity doppler negative for DVT -Patient was recently placed on Eliquis however may have had blood clot prior to starting this medication -CT chest w/o contrast: suspected ARDS complicating a multilobar pneumonia or acute interstitial pneumonia.  -PCCM consulted- recommended high dose steroids, discontinuation of amiodarone and bromocriptine planning to; wean steroids starting 2/28 with very slow taper over the next several months  -Autoimmune workup including Sjogrens A/B , MPO, RF, ANCA, hypersensitivity pneumonitis, ANA all negative  -Currently on HFNC and remaining stable, using BiPAP qhs   Acute on chronic systolic CHF exacerbation -Echocardiogram showed EF of 30-35% (echocardiogram December 2015 showed an EF of 50-55%) -BNP on admission 2340.8 -Cardiology consulted  -Diuresed well on IV lasix, transition to PO per cardiology  -Monitor intake and output, daily weights   Sepsis secondary to pneumonia -Chest x-ray on admission showed new bilateral pulmonary infiltrates- possible pneumonia. -Upon admission, patient with leukocytosis, tachycardia and elevated lactic acid level -Continue Levaquin -Blood culture shows no growth to date -Urine strep pneumonia antigen negative -CT scan noted above  Persistent atrial fibrillation/atrial flutter -CHADSVASC 6 -TSH 0.405 -Patient placed on Eliquis --> IV heparin due to BiPAP usage  -Amiodarone discontinued due to respiratory symptoms -Cardiology consulted  -Continue metoprolol   Acute kidney injury on chronic kidney disease, stage III -Creatinine peaked at 1.95, baseline  approximately 1.3-1.4 -Improving   Essential hypertension -Ramipril held -Continue metoprolol   Diabetes mellitus, type II -Home  Januvia and repaglinide  held -Continue insulin sliding scale CBG monitoring, Lantus. Insulin regimen increased today while on steroids   Hypothyroidism -TSH 0.405 -Continue IV synthroid   Hyperlipidemia -Continue statin  History of GI bleed -Secondary to ischemic colitis, patient was on Coumadin at that time -Case was briefly discussed by Dr. Ree Kida with cardiologist, Dr. Acie Fredrickson, who recommended anticoagulation given her high CHADSVASC score -Closely monitor for signs and symptoms of bleeding -Continue to monitor CBC-hemoglobin appears to be stable  Sick sinus syndrome -Has dual-chamber pacemaker, and currently on telemetry   DVT prophylaxis: Heparin subq  Code Status: Full Family Communication: Husband and daughters at bedside Disposition Plan: Considering LTACH   Consultants:   Cardiology  PCCM  Antimicrobials:  Anti-infectives (From admission, onward)   Start     Dose/Rate Route Frequency Ordered Stop   06/09/17 1700  levofloxacin (LEVAQUIN) IVPB 500 mg     500 mg 100 mL/hr over 60 Minutes Intravenous Every 24 hours 06/09/17 1551     06/03/17 1400  levofloxacin (LEVAQUIN) IVPB 500 mg     500 mg 100 mL/hr over 60 Minutes Intravenous Every 24 hours 06/02/17 1329 06/07/17 1455   06/02/17 1230  levofloxacin (LEVAQUIN) IVPB 500 mg     500 mg 100 mL/hr over 60 Minutes Intravenous  Once 06/02/17 1158 06/02/17 1543        Subjective: Patient without new complaints. Breathing seems improved, tolerating HFNC O2. No chest pain.   Objective: Vitals:   06/11/17 0815 06/11/17 0844 06/11/17 1247 06/11/17 1320  BP: 120/61 120/61 110/82 110/82  Pulse:  83  80  Resp:  (!) 23  14  Temp: (!) 97.5 F (36.4 C)  97.9 F (36.6 C)   TempSrc: Oral  Oral   SpO2: 90% 91% (!) 89% 90%  Weight:      Height:        Intake/Output Summary (Last 24 hours) at 06/11/2017 1525 Last data filed at 06/11/2017 1100 Gross per 24 hour  Intake 866 ml  Output 2050 ml  Net -1184 ml    Filed Weights   06/09/17 0357 06/10/17 0641 06/11/17 0500  Weight: 71.3 kg (157 lb 3 oz) 71.2 kg (156 lb 15.5 oz) 70.6 kg (155 lb 10.3 oz)    Examination:  General exam: Appears calm and comfortable  Respiratory system: Crackles bilaterally. Respiratory effort normal without disterss Cardiovascular system: S1 & S2 heard, Irreg rhythm. No JVD, murmurs, rubs, gallops or clicks. No pedal edema. Gastrointestinal system: Abdomen is nondistended, soft and nontender. No organomegaly or masses felt. Normal bowel sounds heard. Central nervous system: Alert and oriented. No focal neurological deficits. Extremities: Symmetric 5 x 5 power. Skin: No rashes, lesions or ulcers Psychiatry: Judgement and insight appear normal. Mood & affect appropriate.   Data Reviewed: I have personally reviewed following labs and imaging studies  CBC: Recent Labs  Lab 06/07/17 0323 06/08/17 0440 06/09/17 0438 06/10/17 0332 06/11/17 0403  WBC 11.8* 18.9* 17.0* 17.5* 20.0*  HGB 10.5* 10.7* 10.6* 11.1* 11.1*  HCT 32.8* 32.9* 32.8* 34.8* 35.6*  MCV 92.4 90.6 90.4 90.2 91.3  PLT 346 381 337 341 332   Basic Metabolic Panel: Recent Labs  Lab 06/05/17 0438 06/06/17 0520 06/07/17 0323 06/08/17 0440 06/09/17 0438 06/10/17 0838 06/11/17 0403  NA 134* 140 137 134* 137 139 139  K 3.3* 3.8 4.1 3.5 3.3* 4.5 3.8  CL 97* 99* 94* 93* 95* 96* 95*  CO2 24 27 30 27 28 29  33*  GLUCOSE 150* 156* 244* 312* 333*  424* 299*  BUN 36* 41* 56* 71* 74* 63* 59*  CREATININE 1.80* 1.60* 1.72* 1.72* 1.57* 1.51* 1.41*  CALCIUM 8.1* 8.7* 8.7* 8.1* 8.2* 8.7* 8.7*  MG 1.8 2.1  --  2.0  --   --   --    GFR: Estimated Creatinine Clearance: 32.2 mL/min (A) (by C-G formula based on SCr of 1.41 mg/dL (H)). Liver Function Tests: No results for input(s): AST, ALT, ALKPHOS, BILITOT, PROT, ALBUMIN in the last 168 hours. No results for input(s): LIPASE, AMYLASE in the last 168 hours. No results for input(s): AMMONIA in the last 168  hours. Coagulation Profile: No results for input(s): INR, PROTIME in the last 168 hours. Cardiac Enzymes: No results for input(s): CKTOTAL, CKMB, CKMBINDEX, TROPONINI in the last 168 hours. BNP (last 3 results) No results for input(s): PROBNP in the last 8760 hours. HbA1C: No results for input(s): HGBA1C in the last 72 hours. CBG: Recent Labs  Lab 06/10/17 1227 06/10/17 1636 06/10/17 2004 06/11/17 0749 06/11/17 1118  GLUCAP 427* 225* 313* 396* 343*   Lipid Profile: No results for input(s): CHOL, HDL, LDLCALC, TRIG, CHOLHDL, LDLDIRECT in the last 72 hours. Thyroid Function Tests: No results for input(s): TSH, T4TOTAL, FREET4, T3FREE, THYROIDAB in the last 72 hours. Anemia Panel: No results for input(s): VITAMINB12, FOLATE, FERRITIN, TIBC, IRON, RETICCTPCT in the last 72 hours. Sepsis Labs: No results for input(s): PROCALCITON, LATICACIDVEN in the last 168 hours.  Recent Results (from the past 240 hour(s))  Culture, blood (routine x 2)     Status: None   Collection Time: 06/02/17 11:31 AM  Result Value Ref Range Status   Specimen Description BLOOD RIGHT ANTECUBITAL  Final   Special Requests   Final    BOTTLES DRAWN AEROBIC AND ANAEROBIC Blood Culture adequate volume   Culture   Final    NO GROWTH 5 DAYS Performed at [facility], [address] [place], [place] [address]    Report Status 06/07/2017 FINAL  Final  Respiratory Panel by PCR     Status: None   Collection Time: 06/03/17 10:49 AM  Result Value Ref Range Status   Adenovirus NOT DETECTED NOT DETECTED Final   Coronavirus 229E NOT DETECTED NOT DETECTED Final   Coronavirus HKU1 NOT DETECTED NOT DETECTED Final   Coronavirus NL63 NOT DETECTED NOT DETECTED Final   Coronavirus OC43 NOT DETECTED NOT DETECTED Final   Metapneumovirus NOT DETECTED NOT DETECTED Final   Rhinovirus / Enterovirus NOT DETECTED NOT DETECTED Final   Influenza A NOT DETECTED NOT DETECTED Final   Influenza B NOT DETECTED NOT DETECTED Final    Parainfluenza Virus 1 NOT DETECTED NOT DETECTED Final   Parainfluenza Virus 2 NOT DETECTED NOT DETECTED Final   Parainfluenza Virus 3 NOT DETECTED NOT DETECTED Final   Parainfluenza Virus 4 NOT DETECTED NOT DETECTED Final   Respiratory Syncytial Virus NOT DETECTED NOT DETECTED Final   Bordetella pertussis NOT DETECTED NOT DETECTED Final   Chlamydophila pneumoniae NOT DETECTED NOT DETECTED Final   Mycoplasma pneumoniae NOT DETECTED NOT DETECTED Final    Comment: Performed at [facility], [place] [address], [place], [place] [address]  MRSA PCR Screening     Status: None   Collection Time: 06/05/17 12:01 AM  Result Value Ref Range Status   MRSA by PCR NEGATIVE NEGATIVE Final    Comment:        The GeneXpert MRSA Assay (FDA approved for NASAL specimens only), is one component of a comprehensive MRSA  colonization surveillance program. It is not intended to diagnose MRSA infection nor to guide or monitor treatment for MRSA infections. Performed at [facility], [place] [address] [place], [place] [address]        Radiology Studies: Dg Chest [place] 1 View  Result Date: 06/10/2017 CLINICAL DATA:  77 year old female with shortness of breath. Amiodarone neurotoxicity. Subsequent encounter. EXAM: PORTABLE CHEST 1 VIEW COMPARISON:  06/09/2017 and 03/28/2014 chest x-ray. 06/06/2017 chest CT. FINDINGS: Similar appearance of asymmetric diffuse airspace disease. There may be a component of underlying chronic lung changes from amiodarone toxicity however, superimposed infectious infiltrate/pulmonary edema not excluded in the proper clinical setting. This represents a distinct change from 03/28/2014. After treatment of any clinically suspected infectious infiltrate or pulmonary edema, patient would benefit from follow-up chest x-ray to establish baseline. Cardiomegaly with sequential pacemaker in place.  No pneumothorax. Calcified aorta. IMPRESSION: Similar appearance to recent chest x-ray.  Question asymmetric pulmonary edema/infiltrates superimposed upon chronic changes as detailed above. Electronically Signed   By: Genia Del M.D.   On: 06/10/2017 08:32      Scheduled Meds: . atorvastatin  20 mg Oral Daily  . furosemide  40 mg Oral BID  . guaiFENesin  1,200 mg Oral BID  . insulin aspart  0-15 Units Subcutaneous TID WC  . insulin aspart  0-5 Units Subcutaneous QHS  . insulin aspart  4 Units Subcutaneous TID WC  . insulin glargine  22 Units Subcutaneous Daily  . levothyroxine  44 mcg Intravenous Daily  . mouth rinse  15 mL Mouth Rinse BID  . methylPREDNISolone (SOLU-MEDROL) injection  125 mg Intravenous Q6H  . metoprolol tartrate  12.5 mg Oral BID   Continuous Infusions: . heparin 1,150 Units/hr (06/09/17 2050)  . levofloxacin (LEVAQUIN) IV Stopped (06/10/17 1930)     LOS: 9 days    Time spent: 40 minutes   Dessa Phi, DO [facility] [email or web address] Password [facility] 06/11/2017, 3:25 PM

## 2017-06-12 ENCOUNTER — Inpatient Hospital Stay (HOSPITAL_COMMUNITY): Payer: Medicare Other

## 2017-06-12 LAB — BASIC METABOLIC PANEL
Anion gap: 13 (ref 5–15)
BUN: 62 mg/dL — ABNORMAL HIGH (ref 6–20)
CALCIUM: 8.6 mg/dL — AB (ref 8.9–10.3)
CO2: 33 mmol/L — ABNORMAL HIGH (ref 22–32)
CREATININE: 1.53 mg/dL — AB (ref 0.44–1.00)
Chloride: 94 mmol/L — ABNORMAL LOW (ref 101–111)
GFR calc Af Amer: 37 mL/min — ABNORMAL LOW (ref >60)
GFR, EST NON AFRICAN AMERICAN: 32 mL/min — AB (ref >60)
Glucose, Bld: 166 mg/dL — ABNORMAL HIGH (ref 65–99)
Potassium: 3.7 mmol/L (ref 3.5–5.1)
Sodium: 140 mmol/L (ref 135–145)

## 2017-06-12 LAB — GLUCOSE, CAPILLARY
GLUCOSE-CAPILLARY: 143 mg/dL — AB (ref 65–99)
Glucose-Capillary: 167 mg/dL — ABNORMAL HIGH (ref 65–99)
Glucose-Capillary: 178 mg/dL — ABNORMAL HIGH (ref 65–99)
Glucose-Capillary: 186 mg/dL — ABNORMAL HIGH (ref 65–99)
Glucose-Capillary: 199 mg/dL — ABNORMAL HIGH (ref 65–99)
Glucose-Capillary: 208 mg/dL — ABNORMAL HIGH (ref 65–99)
Glucose-Capillary: 231 mg/dL — ABNORMAL HIGH (ref 65–99)

## 2017-06-12 LAB — CBC
HCT: 36.7 % (ref 36.0–46.0)
Hemoglobin: 11.9 g/dL — ABNORMAL LOW (ref 12.0–15.0)
MCH: 29.2 pg (ref 26.0–34.0)
MCHC: 32.4 g/dL (ref 30.0–36.0)
MCV: 90.2 fL (ref 78.0–100.0)
PLATELETS: 313 10*3/uL (ref 150–400)
RBC: 4.07 MIL/uL (ref 3.87–5.11)
RDW: 15.1 % (ref 11.5–15.5)
WBC: 27.9 10*3/uL — ABNORMAL HIGH (ref 4.0–10.5)

## 2017-06-12 LAB — PROCALCITONIN: PROCALCITONIN: 0.2 ng/mL

## 2017-06-12 LAB — HEPARIN LEVEL (UNFRACTIONATED): HEPARIN UNFRACTIONATED: 0.33 [IU]/mL (ref 0.30–0.70)

## 2017-06-12 MED ORDER — LEVOTHYROXINE SODIUM 88 MCG PO TABS
88.0000 ug | ORAL_TABLET | Freq: Every day | ORAL | Status: DC
  Administered 2017-06-13: 88 ug via ORAL
  Filled 2017-06-12: qty 1

## 2017-06-12 MED ORDER — APIXABAN 5 MG PO TABS
5.0000 mg | ORAL_TABLET | Freq: Two times a day (BID) | ORAL | Status: DC
  Administered 2017-06-12 – 2017-06-13 (×3): 5 mg via ORAL
  Filled 2017-06-12 (×3): qty 1

## 2017-06-12 MED ORDER — METHYLPREDNISOLONE SODIUM SUCC 125 MG IJ SOLR
125.0000 mg | Freq: Two times a day (BID) | INTRAMUSCULAR | Status: DC
  Administered 2017-06-12 – 2017-06-13 (×2): 125 mg via INTRAVENOUS
  Filled 2017-06-12 (×2): qty 2

## 2017-06-12 NOTE — Progress Notes (Signed)
ANTICOAGULATION CONSULT NOTE - Follow Up Consult  Pharmacy Consult for Heparin -> Eliquis Indication: atrial fibrillation  Patient Measurements: Height: 5\' 4"  (162.6 cm) Weight: 150 lb (68 kg) IBW/kg (Calculated) : 54.7 Heparin Dosing Weight: 71 kg  Vital Signs: Temp: 97.8 F (36.6 C) (02/28 1152) Temp Source: Oral (02/28 1152) BP: 129/74 (02/28 1152) Pulse Rate: 83 (02/28 1152)  Labs: Recent Labs    06/10/17 0332 06/10/17 0838 06/11/17 0403 06/12/17 0327  HGB 11.1*  --  11.1* 11.9*  HCT 34.8*  --  35.6* 36.7  PLT 341  --  321 313  APTT 68*  --  73*  --   HEPARINUNFRC 0.52  --  0.42 0.33  CREATININE  --  1.51* 1.41* 1.53*    Estimated Creatinine Clearance: 29.2 mL/min (A) (by C-G formula based on SCr of 1.53 mg/dL (H)).  Assessment:  77 year old female on Eliquis PTA for Afib. Transitioned to IV heparin on 2/21, now back to Eliquis today.  Scr 1.53, but < 80 yrs old and > 60 kg, so standard dose Eliquis.  Goal of Therapy:  Heparin level 0.3-0.7 units/ml appropriate Eliquis dose for indication Monitor platelets by anticoagulation protocol: Yes   Plan:   Eliquis 5 mg PO BID resumed this am.  IV heparin stopped.  Arty Baumgartner, [place] Pager: [phone] or [phone] 06/12/2017,3:58 PM

## 2017-06-12 NOTE — Progress Notes (Addendum)
Progress Note  Patient Name: Becky Forbes Date of Encounter: 06/12/2017  Primary Cardiologist: Mertie Moores, MD   Subjective   Reports some improvement in her breathing. Denies chest pain or palpitations. Has an appetite for the first time in a while. No other complaints. Looking forward to walking with PT today.   Inpatient Medications    Scheduled Meds: . apixaban  5 mg Oral BID  . atorvastatin  20 mg Oral Daily  . furosemide  40 mg Oral BID  . guaiFENesin  1,200 mg Oral BID  . insulin aspart  0-15 Units Subcutaneous Q4H  . insulin aspart  4 Units Subcutaneous TID WC  . insulin glargine  30 Units Subcutaneous Daily  . levothyroxine  44 mcg Intravenous Daily  . mouth rinse  15 mL Mouth Rinse BID  . methylPREDNISolone (SOLU-MEDROL) injection  125 mg Intravenous Q6H  . metoprolol tartrate  12.5 mg Oral BID   Continuous Infusions: . heparin 1,150 Units/hr (06/09/17 2050)  . levofloxacin (LEVAQUIN) IV 500 mg (06/11/17 1706)   PRN Meds: acetaminophen, ipratropium, levalbuterol, metoprolol tartrate, ondansetron **OR** ondansetron (ZOFRAN) IV, polyethylene glycol, sodium chloride, traZODone   Vital Signs    Vitals:   06/11/17 2016 06/12/17 0005 06/12/17 0528 06/12/17 0732  BP: 119/65 99/72 (!) 139/95 136/77  Pulse: 88 81 85 83  Resp: 18 17 18 16   Temp: 97.6 F (36.4 C) 97.7 F (36.5 C) (!) 97 F (36.1 C) 97.6 F (36.4 C)  TempSrc: Oral Axillary Axillary Axillary  SpO2: (!) 89% 92% 97% 92%  Weight:   150 lb (68 kg)   Height:        Intake/Output Summary (Last 24 hours) at 06/12/2017 0901 Last data filed at 06/12/2017 0000 Gross per 24 hour  Intake 126.5 ml  Output 900 ml  Net -773.5 ml   Filed Weights   06/10/17 0641 06/11/17 0500 06/12/17 0528  Weight: 156 lb 15.5 oz (71.2 kg) 155 lb 10.3 oz (70.6 kg) 150 lb (68 kg)    Telemetry    Atrial fibrillation, occasional PVCs - Personally Reviewed  Physical Exam   GEN: Frail elderly female laying in bed in no  acute distress.   Neck: No JVD, no carotid bruits Cardiac: IRRR, no murmurs, rubs, or gallops.  Respiratory: diffuse crackles, mild rhonchi at bases GI: NABS, Soft, nontender, non-distended  MS: No edema; No deformity. Neuro:  Nonfocal, moving all extremities spontaneously Psych: Normal affect   Labs    Chemistry Recent Labs  Lab 06/10/17 0838 06/11/17 0403 06/12/17 0327  NA 139 139 140  K 4.5 3.8 3.7  CL 96* 95* 94*  CO2 29 33* 33*  GLUCOSE 424* 299* 166*  BUN 63* 59* 62*  CREATININE 1.51* 1.41* 1.53*  CALCIUM 8.7* 8.7* 8.6*  GFRNONAA 32* 35* 32*  GFRAA 37* 40* 37*  ANIONGAP 14 11 13      Hematology Recent Labs  Lab 06/10/17 0332 06/11/17 0403 06/12/17 0327  WBC 17.5* 20.0* 27.9*  RBC 3.86* 3.90 4.07  HGB 11.1* 11.1* 11.9*  HCT 34.8* 35.6* 36.7  MCV 90.2 91.3 90.2  MCH 28.8 28.5 29.2  MCHC 31.9 31.2 32.4  RDW 15.0 15.2 15.1  PLT 341 321 313    Cardiac EnzymesNo results for input(s): TROPONINI in the last 168 hours. No results for input(s): TROPIPOC in the last 168 hours.   BNPNo results for input(s): BNP, PROBNP in the last 168 hours.   DDimer No results for input(s): DDIMER in the last  168 hours.   Radiology    Dg Chest Port 1 View  Result Date: 06/12/2017 CLINICAL DATA:  Pneumonitis. EXAM: PORTABLE CHEST 1 VIEW COMPARISON:  Chest radiograph 06/10/2016 FINDINGS: Multi lead pacer apparatus overlies the left hemithorax. Stable cardiac and mediastinal contours. Aortic atherosclerosis. Slight interval improvement right upper lobe heterogeneous opacities. Persistent retrocardiac heterogeneous opacities. No pleural effusion or pneumothorax. IMPRESSION: Slight interval improvement right upper lobe heterogeneous opacities. Persistent left mid and lower lung heterogeneous opacities. Recommend continued radiographic followup to ensure resolution as considerations include an acute infectious process and/or underlying chronic pulmonary changes. Electronically Signed    By: Lovey Newcomer M.D.   On: 06/12/2017 08:11    Cardiac Studies   Echocardiogram 06/03/2017: Study Conclusions  - Left ventricle: The cavity size was mildly dilated. Systolic function was moderately to severely reduced. The estimated ejection fraction was in the range of 30% to 35%. Diffuse hypokinesis. - Aortic valve: Noncoronary cusp mobility was restricted. Transvalvular velocity was within the normal range. There was no stenosis. There was no regurgitation. - Mitral valve: Mildly calcified annulus. Transvalvular velocity was within the normal range. There was no evidence for stenosis. There was mild to moderate regurgitation. - Left atrium: The atrium was severely dilated. - Right ventricle: The cavity size was normal. Wall thickness was normal. Systolic function was normal. - Right atrium: The atrium was severely dilated. - Atrial septum: No defect or patent foramen ovale was identified by color flow Doppler. - Tricuspid valve: There was mild regurgitation. - Pulmonary arteries: Systolic pressure was within the normal range. PA peak pressure: 27 mm Hg (S).  Impressions:  - Compared with the echo 13/24/40, systolic function is reduced.   Patient Profile     77 y.o.femalewith persistent atrial fibrillation, nonischemic cardiomyopathy, sick sinus syndrome status post pacemaker, previous GI bleed,andCKD stage3.She is being managed for hypoxic respiratory failure with parenchymal lung disease/groundglass opacities, question of toxicity related to amiodarone or bromocriptine, both of which have been discontinued. She is being followed by the Pulmonary service.  Assessment & Plan    1. Acute on chronic combined congestive heart failure: TTE this admission with LVEF 30-35% (previously 50-55% 03/2014). Diuresed well with IV lasix, now on po lasix 40mg  BID. UOP net -8.7L this admission. Wt 162lbs on admission, 150lbs today.  - Continue po lasix 40mg   BID - Would benefit from outpatient ischemic evaluation after resolution of current illness  2. Persistent Atrial Fibrillation: rate well controlled  - Continue po metoprolol - Transitioning back to po eliquis this AM  3. Acute hypoxic respiratory failure: possibly 2/2 amiodarone toxicity (now discontinued). Still requiring O2 via [place]. - Planning to transition to po steroids today - Continue management per primary team/ pulmonary team  4. CKD stage III: Cr stable - Continue to monitor closely  5. SSS s/p pacemaker: pacemaker functioning properly on last device check 04/2017  For questions or updates, please contact [place] Please consult [email or web address] for contact info under Cardiology/STEMI.      Signed, Abigail Butts, PA-C  06/12/2017, 9:01 AM   [phone]  Pt. Seen and examined. Agree with the Resident/NP/PA-C note as written. Continues to improve - remains negative on oral lasix. Transitioning to Eliquis and po steroids. Agree that outpatient ischemia evaluation is reasonable, such as a lexiscan myoview for newly reduced LVEF - although may be related to a-fib with RVR. No further suggestions at this point. Cardiology will sign-off. Follow-up with Dr. Acie Fredrickson after discharge.  Time Spent with Patient: I  have spent a total of 15 minutes with patient reviewing hospital notes, telemetry, EKGs, labs and examining the patient as well as establishing an assessment and plan that was discussed with the patient. > 50% of time was spent in direct patient care.  Pixie Casino, MD, [facility], [place] Director of the [facility] Lipid [facility] &  Cardiovascular Risk Reduction [facility] Diplomate of the American Board of Clinical Lipidology Attending Cardiologist  Direct Dial: [phone]  Fax: [phone]  Website:  [email or web address]

## 2017-06-12 NOTE — Progress Notes (Signed)
Name: Becky Forbes MRN: [id number] DOB: 02/06/1941    ADMISSION DATE:  06/02/2017 CONSULTATION DATE:  06/06/17  REFERRING MD :  Dr. 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    The patient reports she has had decreased activity tolerance since January 2019 and shortness of breath.  She reports she thinks she has had some weight loss.  She denies swelling in her lower extremities or pain.  She reports chest pressure at times.  She denies fevers, chills, nausea, vomiting, choking / difficulty swallowing, n/v/d, viral illness / sick contacts.  She went to [place] to shop with her sister the weekend before admit and after "just crashed".    She has been married for 56 years.  Her husband used to smoke but quit 40+ years ago.  She has had no known occupational exposures, wood heat, mold, birds / Curator.  The patient reports she has been on Amiodarone for approximately 3 years but was recently decreased to 3 times weekly (followed by Dr. Cathie Olden).      Since admit, she has been on BiPAP / increased O2 since admit.  Work up for PE is pending.  Despite high O2 needs her work of breathing has been normal.  Cardiology was consulted and she was diuresed 3.8L since admit.  ECHO revealed new reduction of LVEF to 30-35%.  She remains in AF with rate of 100-120's.  CT of the chest without contrast assessed which demonstrated diffuse bilateral ground glass.     PCCM consulted for pulmonary evaluation.     CULTURES RVP 2/19 >> negative  BCx2 2/18 >>   ANTIBIOTICS  Levaquin 2/19 >>     SIGNIFICANT EVENTS  2/19  Admit , RVP negative, Urine strep negative 2/22  PCCM consulted  AUTOIMMUNE (pre-steroids) HSP 2/22 >>  SSA 2/22 >> less than 0.2 SSB 2/22 >> less than 0.2 ANCA 2/22 >> neg RF 2/22 >> 11.8 ANA 2/22 >> neg CRP 2/22 >> 34.2 ESR 2/22 >> 125 2/22 CT Chest w/o >> Patchy  consolidation in the dependent right upper lobe, lingula and dependent right lower lobe. Extensive thickening of the peribronchovascular interstitium and extensive ground-glass attenuation throughout both lungs with associated mild traction bronchiolectasis and architectural distortion. Nonspecific mediastinal and bilateral hilar adenopathy, likely reactive.  Dilated main pulmonary artery, suggesting pulmonary arterialHypertension. Three-vessel coronary atherosclerosis.  Small hiatal hernia. 2/22 LE Doppler >> negative  2/22 - start solumedrol 165m IV q6h 2/23  45L/60% HFNC  2/24  40L/60% HFNC 2/25  - Feels a little better 2/27 - She continues to feel better still having high FiO2 requirements currently on 15 L high flow nasal oxygen but no longer on heated high flow does not like the BiPAP machine    SUBJECTIVE/OVERNIGHT/INTERVAL HX 2/28 - feeling better but still on 15LNC. Did use BiPAP QHS. Daughter at bedside. Afebrile other than Tmax 99 on 2/21 and 2/26. On levaquin since 2/25. Has rising wbc though currently 30K   VITAL SIGNS: Temp:  [97 F (36.1 C)-97.9 F (36.6 C)] 97.6 F (36.4 C) (02/28 0732) Pulse Rate:  [80-88] 83 (02/28 0732) Resp:  [14-18] 16 (02/28 0732) BP: (99-139)/(65-95) 136/77 (02/28 0732) SpO2:  [89 %-97 %] 92 % (02/28 0732) Weight:  [68 kg (150 lb)] 68 kg (150 lb) (02/28 0528)  Intake/Output Summary (Last 24 hours) at 06/12/2017 [id number] data  filed at 06/12/2017 0000 Gross per 24 hour  Intake 126.5 ml  Output 900 ml  Net -773.5 ml   Filed Weights   06/10/17 0641 06/11/17 0500 06/12/17 0528  Weight: 71.2 kg (156 lb 15.5 oz) 70.6 kg (155 lb 10.3 oz) 68 kg (150 lb)   PHYSICAL EXAMINATION:  General Appearance:    Looks frail bu stable but deconditioned  Head:    Normocephalic, without obvious abnormality, atraumatic  Eyes:    PERRL - yes, conjunctiva/corneas - clear      Ears:    Normal external ear canals, both ears  Nose:   NG tube - no but has [place]    Throat:  ETT TUBE - no , OG tube - no  Neck:   Supple,  No enlargement/tenderness/nodules     Lungs:     Clear to auscultation bilaterally but some crackles. Mild tachypnea but no distrss  Chest wall:    No deformity  Heart:    S1 and S2 normal, no murmur, CVP - no.  Pressors - no  Abdomen:     Soft, no masses, no organomegaly  Genitalia:    Not done  Rectal:   not done  Extremities:   Extremities- intact     Skin:   Intact in exposed areas .      Neurologic:  Moves all 4s - yes. CAM-ICU - neg . Orientation - x3+       PULMONARY No results for input(s): PHART, PCO2ART, PO2ART, HCO3, TCO2, O2SAT in the last 168 hours.  Invalid input(s): PCO2, PO2  CBC Recent Labs  Lab 06/10/17 0332 06/11/17 0403 06/12/17 0327  HGB 11.1* 11.1* 11.9*  HCT 34.8* 35.6* 36.7  WBC 17.5* 20.0* 27.9*  PLT 341 321 313    COAGULATION No results for input(s): INR in the last 168 hours.  CARDIAC  No results for input(s): TROPONINI in the last 168 hours. No results for input(s): PROBNP in the last 168 hours.   CHEMISTRY Recent Labs  Lab 06/06/17 0520  06/08/17 0440 06/09/17 0438 06/10/17 0838 06/11/17 0403 06/12/17 0327  NA 140   < > 134* 137 139 139 140  K 3.8   < > 3.5 3.3* 4.5 3.8 3.7  CL 99*   < > 93* 95* 96* 95* 94*  CO2 27   < > 27 28 29  33* 33*  GLUCOSE 156*   < > 312* 333* 424* 299* 166*  BUN 41*   < > 71* 74* 63* 59* 62*  CREATININE 1.60*   < > 1.72* 1.57* 1.51* 1.41* 1.53*  CALCIUM 8.7*   < > 8.1* 8.2* 8.7* 8.7* 8.6*  MG 2.1  --  2.0  --   --   --   --    < > = values in this interval not displayed.   Estimated Creatinine Clearance: 29.2 mL/min (A) (by C-G formula based on SCr of 1.53 mg/dL (H)).   LIVER No results for input(s): AST, ALT, ALKPHOS, BILITOT, PROT, ALBUMIN, INR in the last 168 hours.   INFECTIOUS No results for input(s): LATICACIDVEN, PROCALCITON in the last 168 hours.   ENDOCRINE CBG (last 3)  Recent Labs    06/12/17 0011 06/12/17 0524  06/12/17 0734  GLUCAP 178* 186* 199*    Recent Results (from the past 720 hour(s))  Culture, blood (routine x 2)     Status: None   Collection Time: 06/02/17 11:31 AM  Result Value Ref Range Status   Specimen Description BLOOD  RIGHT ANTECUBITAL  Final   Special Requests   Final    BOTTLES DRAWN AEROBIC AND ANAEROBIC Blood Culture adequate volume   Culture   Final    NO GROWTH 5 DAYS Performed at [facility], [address], [place], [place] [address]    Report Status 06/07/2017 FINAL  Final  Respiratory Panel by PCR     Status: None   Collection Time: 06/03/17 10:49 AM  Result Value Ref Range Status   Adenovirus NOT DETECTED NOT DETECTED Final   Coronavirus 229E NOT DETECTED NOT DETECTED Final   Coronavirus HKU1 NOT DETECTED NOT DETECTED Final   Coronavirus NL63 NOT DETECTED NOT DETECTED Final   Coronavirus OC43 NOT DETECTED NOT DETECTED Final   Metapneumovirus NOT DETECTED NOT DETECTED Final   Rhinovirus / Enterovirus NOT DETECTED NOT DETECTED Final   Influenza A NOT DETECTED NOT DETECTED Final   Influenza B NOT DETECTED NOT DETECTED Final   Parainfluenza Virus 1 NOT DETECTED NOT DETECTED Final   Parainfluenza Virus 2 NOT DETECTED NOT DETECTED Final   Parainfluenza Virus 3 NOT DETECTED NOT DETECTED Final   Parainfluenza Virus 4 NOT DETECTED NOT DETECTED Final   Respiratory Syncytial Virus NOT DETECTED NOT DETECTED Final   Bordetella pertussis NOT DETECTED NOT DETECTED Final   Chlamydophila pneumoniae NOT DETECTED NOT DETECTED Final   Mycoplasma pneumoniae NOT DETECTED NOT DETECTED Final    Comment: Performed at [facility], [place] [address], [place], [place] [address]  MRSA PCR Screening     Status: None   Collection Time: 06/05/17 12:01 AM  Result Value Ref Range Status   MRSA by PCR NEGATIVE NEGATIVE Final    Comment:        The GeneXpert MRSA Assay (FDA approved for NASAL specimens only), is one component of a comprehensive MRSA colonization surveillance  program. It is not intended to diagnose MRSA infection nor to guide or monitor treatment for MRSA infections. Performed at [facility], [place] [address] [place], [place] [address]       Antibiotics Given (last 72 hours)    Date/Time Action Medication Dose Rate   06/09/17 1739 New Bag/Given   levofloxacin (LEVAQUIN) IVPB 500 mg 500 mg 100 mL/hr   06/10/17 1804 New Bag/Given   levofloxacin (LEVAQUIN) IVPB 500 mg 500 mg 100 mL/hr   06/11/17 1706 New Bag/Given   levofloxacin (LEVAQUIN) IVPB 500 mg 500 mg 100 mL/hr        IMAGING x48h  - image(s) personally visualized  -   highlighted in bold Dg Chest Port 1 View  Result Date: 06/12/2017 CLINICAL DATA:  Pneumonitis. EXAM: PORTABLE CHEST 1 VIEW COMPARISON:  Chest radiograph 06/10/2016 FINDINGS: Multi lead pacer apparatus overlies the left hemithorax. Stable cardiac and mediastinal contours. Aortic atherosclerosis. Slight interval improvement right upper lobe heterogeneous opacities. Persistent retrocardiac heterogeneous opacities. No pleural effusion or pneumothorax. IMPRESSION: Slight interval improvement right upper lobe heterogeneous opacities. Persistent left mid and lower lung heterogeneous opacities. Recommend continued radiographic followup to ensure resolution as considerations include an acute infectious process and/or underlying chronic pulmonary changes. Electronically Signed   By: Lovey Newcomer M.D.   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 Slowly better. 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  She has a hx of chronic combined CHF and  has been diuresed 3.8L and is clinically euvolemic.  Working w/ DDx includes amiodarone lung toxicity (she is also on bromocriptine which can be associated with pulmonary fibrosis), autoimmune pulmonary manifestation, cardiogenic pulmonary edema & hypersensitivity pneumonitis. -autoimmune panel neg   2/27 - Acute resp failure due to amio tox (clinical dx). Slowly better. Still needing 15LNC. Despite rising wbc do not see evidence of pneumonia bacterial. Anticipate slow improvement only over weeks to months  Plan: Hold amiodarone and bromocriptine indef  Wean oxygen  BIPAP at HS  - recommend this to  Supplement resp muscle strenght Day # 6 high dose solumedrol with day # 5 solumedrol 125 mg q 6;   - reduce to half dose  06/12/17 and then over next fwe to several days get to prednisone 69m per day and hold then for a month; overall 3-6 months steroids needed Cont lasix as BP/BUN/cr allow DC levauin (can rrestart if PCT very hight; check PCT) Consider LTAC, her oxygen requirements are much too high for her to go to skilled nursing and I suspect it will take her some time before this improves  PCCM will see 1-2X/weeks   Dr. MBrand Males M.D., FPeoria Ambulatory SurgeryC.P Pulmonary and Critical Care Medicine Staff Physician, CBenningtonDirector - Interstitial Lung Disease  Program  Pulmonary [place] [id number] Pager: [phone] If no answer or between  15:00h - 7:00h: call [phone] Telephone: [phone]

## 2017-06-12 NOTE — Evaluation (Signed)
Physical Therapy Evaluation Patient Details Name: Becky Forbes MRN: [id number] DOB: 23-Oct-1940 Today's Date: 06/12/2017   History of Present Illness  77yo female presenting to the ED with increased shortness of breath, productive cough. She was admitted to the hospital for acute respiratory failure with hypoxia, acute on chronic CHF exacerbation. PMH CAD, CHF, CKD, DM, hx GI bleed, HTN, hypothyroidism, pacemaker, renal insufficiency, sick sinus syndrome, atrial flutter, hx back surgery, carpal tunnel, hx foot surgery, hx joint replacement, lumbar laminectomy, osteotomy with fusion   Clinical Impression   Patient received in bed, very pleasant and willing to participate in PT today. Note SpO2 is 81% at rest on 15LPM on high flow nasal cannula, patient able to recover to 95% with pursed lip breathing. Able to perform supine to sit with min guard and increased time/effort however SpO2 dropped to 81-85%, able to recover to 95% and functional sit to stand was then attempted however SpO2 quickly dropped to 75% on 15LPM. Unable to recover in sitting and noting strong accessory breathing pattern, returned to supine where patient was able to recover to 96% with pursed lip breathing. Skilled PT session severely limited by medical status/SpO2 desaturation today. Patient left in bed with all needs met this afternoon. Moving forward, she will benefit from care in [facility] facility for ongoing management of her medical condition as well as skilled PT services in this setting as appropriate.     Follow Up Recommendations LTACH;Other (comment)(select specialty LTACH )    Equipment Recommendations  None recommended by PT(defer to next venue )    Recommendations for Other Services       Precautions / Restrictions Precautions Precautions: Fall;Other (comment) Precaution Comments: watch SpO2- desaturates easily  Restrictions Weight Bearing Restrictions: No      Mobility  Bed Mobility Overal bed mobility:  Needs Assistance Bed Mobility: Supine to Sit;Sit to Supine     Supine to sit: Min guard Sit to supine: Min guard   General bed mobility comments: extended time and effort   Transfers Overall transfer level: Needs assistance Equipment used: Rolling walker (2 wheeled) Transfers: Sit to/from Stand Sit to Stand: Mod assist         General transfer comment: ModA to perform partial stand x1, did not practice/assess this further today due to significant SpO2 desat to 75% with this activity on 15LPM O2   Ambulation/Gait             General Gait Details: DNT due to severe SpO2 desaturation with activity   Stairs            Wheelchair Mobility    Modified Rankin (Stroke Patients Only)       Balance Overall balance assessment: Needs assistance Sitting-balance support: Bilateral upper extremity supported;Feet supported Sitting balance-Leahy Scale: Fair     Standing balance support: Bilateral upper extremity supported;During functional activity Standing balance-Leahy Scale: Poor                               Pertinent Vitals/Pain Pain Assessment: No/denies pain    Home Living Family/patient expects to be discharged to:: Private residence Living Arrangements: Spouse/significant other Available Help at Discharge: Family;Available 24 hours/day Type of Home: House Home Access: Stairs to enter Entrance Stairs-Rails: None Entrance Stairs-Number of Steps: 2 Home Layout: Two level;Able to live on main level with bedroom/bathroom Home Equipment: Gilford Rile - 2 wheels;Cane - single point;Bedside commode;Grab bars - tub/shower  Prior Function Level of Independence: Independent         Comments: very independent at baseline, reports she does not use device and was doing well before she was admitted to the hospital      Hand Dominance        Extremity/Trunk Assessment        Lower Extremity Assessment Lower Extremity Assessment: Generalized  weakness    Cervical / Trunk Assessment Cervical / Trunk Assessment: Kyphotic  Communication   Communication: HOH  Cognition Arousal/Alertness: Awake/alert Behavior During Therapy: WFL for tasks assessed/performed Overall Cognitive Status: Within Functional Limits for tasks assessed                                        General Comments General comments (skin integrity, edema, etc.): SpO2 initially 81% at rest on 15L O2 per HFNC; able to recover to 95% with supine pursed lip breathing, dropped to 81-85% with supine to sit transfer then able to recover to 90% when we attempted functional sit to stand however with effort SpO2 dropped to 75%, unable to recover in sitting so returned to supine where she was able to recover to 96% on 15LPM with pursed lip breathing. Note O2 desaturation with speech at rest, accessory muscle breathing pattern.     Exercises     Assessment/Plan    PT Assessment Patient needs continued PT services  PT Problem List Decreased strength;Decreased mobility;Decreased coordination;Decreased activity tolerance;Cardiopulmonary status limiting activity;Decreased balance       PT Treatment Interventions DME instruction;Therapeutic activities;Gait training;Therapeutic exercise;Patient/family education;Stair training;Balance training;Functional mobility training;Neuromuscular re-education    PT Goals (Current goals can be found in the Care Plan section)  Acute Rehab PT Goals Patient Stated Goal: to feel better  PT Goal Formulation: With patient Time For Goal Achievement: 06/26/17 Potential to Achieve Goals: Fair    Frequency Min 2X/week   Barriers to discharge        Co-evaluation               AM-PAC PT "6 Clicks" Daily Activity  Outcome Measure Difficulty turning over in bed (including adjusting bedclothes, sheets and blankets)?: Unable Difficulty moving from lying on back to sitting on the side of the bed? : Unable Difficulty  sitting down on and standing up from a chair with arms (e.g., wheelchair, bedside commode, etc,.)?: Unable Help needed moving to and from a bed to chair (including a wheelchair)?: A Lot Help needed walking in hospital room?: A Lot Help needed climbing 3-5 steps with a railing? : Total 6 Click Score: 8    End of Session Equipment Utilized During Treatment: Gait belt;Oxygen Activity Tolerance: Treatment limited secondary to medical complications (Comment)(limited by fast SpO2 desaturation ) Patient left: in bed;with call bell/phone within reach;with bed alarm set   PT Visit Diagnosis: Unsteadiness on feet (R26.81);Muscle weakness (generalized) (M62.81);Difficulty in walking, not elsewhere classified (R26.2)    Time: 1501-1520 PT Time Calculation (min) (ACUTE ONLY): 19 min   Charges:   PT Evaluation $PT Eval Moderate Complexity: 1 Mod     PT G Codes:        Deniece Ree PT, DPT, CBIS  Supplemental Physical Therapist [place]   Pager [phone]

## 2017-06-12 NOTE — Progress Notes (Signed)
RT placed pt on BIPAP for the night. RT will continue to monitor as needed.

## 2017-06-12 NOTE — Discharge Instructions (Signed)

## 2017-06-12 NOTE — Progress Notes (Signed)
PROGRESS NOTE    Becky Forbes  STM:[id number] DOB: 12-Jun-1940 DOA: 06/02/2017 PCP: Binnie Rail, MD     Brief Narrative:  Becky Forbes a 77 y.o.femalewith medical history significant ofsick sinus syndrome with implanted pacemaker, paroxysmal atrial fibrillation, hypothyroidism, combined diastolic and systolic congestive heart failure, chronic kidney disease, hypothyroidism, diabetes mellitus who presented to the emergency department with complaints of worsening shortness of breath,cough productive of white sputum and chest tightness. Patient reported that since December she was battling cough and shortness of breath off and on and on Friday she was traveling to [place] to see his sister, had a lot of walking around and on the way back, her daughter noticed that patient developed frequent coughs and some shortness of breath. Since Friday, her condition worsened and eventually this night she could not sleep at all and had to come to the ED for evaluation. She denied PND, orthopnea, chest pain, diaphoresis, lower extremity swelling, abdominal pain,she complained of nausea and dry heaves, but no vomiting. She was admitted for acute respiratory failure with hypoxia.  Patient found to have a reduced EF, cardiology consulted and appreciated. Continues to be hypoxic and unable to wean off of bipap. Obtained CT chest w/o contrast which showed ARDS. PCCM consulted.   Assessment & Plan:   Principal Problem:   Acute respiratory failure (HCC) Active Problems:   HYPERLIPIDEMIA   PAF (paroxysmal atrial fibrillation) (HCC)   Chronic combined systolic and diastolic CHF (congestive heart failure) (HCC)   Paroxysmal A-fib (HCC)   Benign essential HTN   Hypothyroidism due to amiodarone   CKD (chronic kidney disease)   CAP (community acquired pneumonia)   Acute exacerbation of CHF (congestive heart failure) (HCC)   Sepsis ([place])   Acute on chronic systolic heart failure (HCC)   Persistent atrial  fibrillation (HCC)   Amiodarone toxicity   Acute respiratory failure with hypoxia  -Secondary to ALI due to amiodarone toxicity/BOOP pattern -DDimer 2.43. Have ordered a VQ scan however could not be done while on BiPAP. Lower extremity doppler negative for DVT -Patient was recently placed on Eliquis however may have had blood clot prior to starting this medication -CT chest w/o contrast: suspected ARDS complicating a multilobar pneumonia or acute interstitial pneumonia.  -Autoimmune workup including Sjogrens A/B , MPO, RF, ANCA, hypersensitivity pneumonitis, ANA all negative  -PCCM consulted- recommended high dose steroids, discontinuation of amiodarone and bromocriptine planning to; wean steroids starting 2/28 with very slow taper over the next several months  -Currently on HFNC and remaining stable, using BiPAP qhs   Acute on chronic systolic CHF exacerbation -Echocardiogram showed EF of 30-35% (echocardiogram December 2015 showed an EF of 50-55%) -BNP on admission 2340.8 -Cardiology consulted  -Diuresed well on IV lasix, transitioned to PO 40mg  BID per cardiology  -Monitor intake and output, daily weights  -Follow up with Dr. Acie Fredrickson and evaluate outpatient ischemic eval once stable   Sepsis secondary to pneumonia -Chest x-ray on admission showed new bilateral pulmonary infiltrates- possible pneumonia. -Upon admission, patient with leukocytosis, tachycardia and elevated lactic acid level -Blood culture shows no growth to date -Urine strep pneumonia antigen negative -CT scan noted above -Levaquin stopped. Procalcitonin 0.20  Persistent atrial fibrillation/atrial flutter -CHADSVASC 6 -TSH 0.405 -Amiodarone discontinued due to respiratory symptoms -Cardiology consulted  -Continue metoprolol  -Resume eliquis today   Acute kidney injury on chronic kidney disease, stage III -Creatinine peaked at 1.95, baseline  approximately 1.3-1.4 -Stable   Essential  hypertension -Ramipril held -Continue metoprolol  Diabetes mellitus, type II -Home Januvia and repaglinide held -Continue insulin sliding scale CBG monitoring, Lantus.  Hypothyroidism -TSH 0.405 -Continue synthroid   Hyperlipidemia -Continue statin  History of GI bleed -Secondary to ischemic colitis, patient was on Coumadin at that time -Case was briefly discussed by Dr. Ree Kida with cardiologist, Dr. Acie Fredrickson, who recommended anticoagulation given her high CHADSVASC score -Closely monitor for signs and symptoms of bleeding -Continue to monitor CBC-hemoglobin appears to be stable  Sick sinus syndrome -Has dual-chamber pacemaker, and currently on telemetry   DVT prophylaxis: Heparin subq  Code Status: Full Family Communication: daughter at bedside Disposition Plan: Considering LTACH, possibly bed availability 3/1    Consultants:   Cardiology  PCCM  Antimicrobials:  Anti-infectives (From admission, onward)   Start     Dose/Rate Route Frequency Ordered Stop   06/09/17 1700  levofloxacin (LEVAQUIN) IVPB 500 mg  Status:  Discontinued     500 mg 100 mL/hr over 60 Minutes Intravenous Every 24 hours 06/09/17 1551 06/12/17 0951   06/03/17 1400  levofloxacin (LEVAQUIN) IVPB 500 mg     500 mg 100 mL/hr over 60 Minutes Intravenous Every 24 hours 06/02/17 1329 06/07/17 1455   06/02/17 1230  levofloxacin (LEVAQUIN) IVPB 500 mg     500 mg 100 mL/hr over 60 Minutes Intravenous  Once 06/02/17 1158 06/02/17 1543       Subjective: Breathing is better. Tolerated BiPAP last night and now on HFNC.   Objective: Vitals:   06/12/17 0005 06/12/17 0528 06/12/17 0732 06/12/17 1152  BP: 99/72 (!) 139/95 136/77 129/74  Pulse: 81 85 83 83  Resp: 17 18 16 14   Temp: 97.7 F (36.5 C) (!) 97 F (36.1 C) 97.6 F (36.4 C) 97.8 F (36.6 C)  TempSrc: Axillary Axillary Axillary Oral  SpO2: 92% 97% 92% 96%  Weight:  68 kg (150 lb)    Height:        Intake/Output Summary (Last 24  hours) at 06/12/2017 1224 Last data filed at 06/12/2017 1154 Gross per 24 hour  Intake 57.5 ml  Output 1800 ml  Net -1742.5 ml   Filed Weights   06/10/17 0641 06/11/17 0500 06/12/17 0528  Weight: 71.2 kg (156 lb 15.5 oz) 70.6 kg (155 lb 10.3 oz) 68 kg (150 lb)    Examination: General exam: Appears calm and comfortable  Respiratory system: Crackles bilaterally. Respiratory effort normal. Cardiovascular system: S1 & S2 heard, Irregular rhythm, rate controlled. No JVD, murmurs, rubs, gallops or clicks. No pedal edema. Gastrointestinal system: Abdomen is nondistended, soft and nontender. No organomegaly or masses felt. Normal bowel sounds heard. Central nervous system: Alert and oriented. No focal neurological deficits. Extremities: Symmetric 5 x 5 power. Skin: No rashes, lesions or ulcers Psychiatry: Judgement and insight appear normal. Mood & affect appropriate.    Data Reviewed: I have personally reviewed following labs and imaging studies  CBC: Recent Labs  Lab 06/08/17 0440 06/09/17 0438 06/10/17 0332 06/11/17 0403 06/12/17 0327  WBC 18.9* 17.0* 17.5* 20.0* 27.9*  HGB 10.7* 10.6* 11.1* 11.1* 11.9*  HCT 32.9* 32.8* 34.8* 35.6* 36.7  MCV 90.6 90.4 90.2 91.3 90.2  PLT 381 337 341 321 270   Basic Metabolic Panel: Recent Labs  Lab 06/06/17 0520  06/08/17 0440 06/09/17 0438 06/10/17 0838 06/11/17 0403 06/12/17 0327  NA 140   < > 134* 137 139 139 140  K 3.8   < > 3.5 3.3* 4.5 3.8 3.7  CL 99*   < > 93* 95* 96* 95*  94*  CO2 27   < > 27 28 29  33* 33*  GLUCOSE 156*   < > 312* 333* 424* 299* 166*  BUN 41*   < > 71* 74* 63* 59* 62*  CREATININE 1.60*   < > 1.72* 1.57* 1.51* 1.41* 1.53*  CALCIUM 8.7*   < > 8.1* 8.2* 8.7* 8.7* 8.6*  MG 2.1  --  2.0  --   --   --   --    < > = values in this interval not displayed.   GFR: Estimated Creatinine Clearance: 29.2 mL/min (A) (by C-G formula based on SCr of 1.53 mg/dL (H)). Liver Function Tests: No results for input(s): AST, ALT,  ALKPHOS, BILITOT, PROT, ALBUMIN in the last 168 hours. No results for input(s): LIPASE, AMYLASE in the last 168 hours. No results for input(s): AMMONIA in the last 168 hours. Coagulation Profile: No results for input(s): INR, PROTIME in the last 168 hours. Cardiac Enzymes: No results for input(s): CKTOTAL, CKMB, CKMBINDEX, TROPONINI in the last 168 hours. BNP (last 3 results) No results for input(s): PROBNP in the last 8760 hours. HbA1C: No results for input(s): HGBA1C in the last 72 hours. CBG: Recent Labs  Lab 06/11/17 2101 06/12/17 0011 06/12/17 0524 06/12/17 0734 06/12/17 1151  GLUCAP 228* 178* 186* 199* 208*   Lipid Profile: No results for input(s): CHOL, HDL, LDLCALC, TRIG, CHOLHDL, LDLDIRECT in the last 72 hours. Thyroid Function Tests: No results for input(s): TSH, T4TOTAL, FREET4, T3FREE, THYROIDAB in the last 72 hours. Anemia Panel: No results for input(s): VITAMINB12, FOLATE, FERRITIN, TIBC, IRON, RETICCTPCT in the last 72 hours. Sepsis Labs: Recent Labs  Lab 06/12/17 0327  PROCALCITON 0.20    Recent Results (from the past 240 hour(s))  Respiratory Panel by PCR     Status: None   Collection Time: 06/03/17 10:49 AM  Result Value Ref Range Status   Adenovirus NOT DETECTED NOT DETECTED Final   Coronavirus 229E NOT DETECTED NOT DETECTED Final   Coronavirus HKU1 NOT DETECTED NOT DETECTED Final   Coronavirus NL63 NOT DETECTED NOT DETECTED Final   Coronavirus OC43 NOT DETECTED NOT DETECTED Final   Metapneumovirus NOT DETECTED NOT DETECTED Final   Rhinovirus / Enterovirus NOT DETECTED NOT DETECTED Final   Influenza A NOT DETECTED NOT DETECTED Final   Influenza B NOT DETECTED NOT DETECTED Final   Parainfluenza Virus 1 NOT DETECTED NOT DETECTED Final   Parainfluenza Virus 2 NOT DETECTED NOT DETECTED Final   Parainfluenza Virus 3 NOT DETECTED NOT DETECTED Final   Parainfluenza Virus 4 NOT DETECTED NOT DETECTED Final   Respiratory Syncytial Virus NOT DETECTED NOT  DETECTED Final   Bordetella pertussis NOT DETECTED NOT DETECTED Final   Chlamydophila pneumoniae NOT DETECTED NOT DETECTED Final   Mycoplasma pneumoniae NOT DETECTED NOT DETECTED Final    Comment: Performed at [facility], [address] [place], [place] [address]  MRSA PCR Screening     Status: None   Collection Time: 06/05/17 12:01 AM  Result Value Ref Range Status   MRSA by PCR NEGATIVE NEGATIVE Final    Comment:        The GeneXpert MRSA Assay (FDA approved for NASAL specimens only), is one component of a comprehensive MRSA colonization surveillance program. It is not intended to diagnose MRSA infection nor to guide or monitor treatment for MRSA infections. Performed at [facility], [place] [address], [place], [place] [address]        Radiology Studies: Dg  Chest Port 1 View  Result Date: 06/12/2017 CLINICAL DATA:  Pneumonitis. EXAM: PORTABLE CHEST 1 VIEW COMPARISON:  Chest radiograph 06/10/2016 FINDINGS: Multi lead pacer apparatus overlies the left hemithorax. Stable cardiac and mediastinal contours. Aortic atherosclerosis. Slight interval improvement right upper lobe heterogeneous opacities. Persistent retrocardiac heterogeneous opacities. No pleural effusion or pneumothorax. IMPRESSION: Slight interval improvement right upper lobe heterogeneous opacities. Persistent left mid and lower lung heterogeneous opacities. Recommend continued radiographic followup to ensure resolution as considerations include an acute infectious process and/or underlying chronic pulmonary changes. Electronically Signed   By: Lovey Newcomer M.D.   On: 06/12/2017 08:11      Scheduled Meds: . apixaban  5 mg Oral BID  . atorvastatin  20 mg Oral Daily  . furosemide  40 mg Oral BID  . guaiFENesin  1,200 mg Oral BID  . insulin aspart  0-15 Units Subcutaneous Q4H  . insulin aspart  4 Units Subcutaneous TID WC  . insulin glargine  30 Units Subcutaneous Daily  . [START ON 06/13/2017] levothyroxine   88 mcg Oral QAC breakfast  . mouth rinse  15 mL Mouth Rinse BID  . methylPREDNISolone (SOLU-MEDROL) injection  125 mg Intravenous Q12H  . metoprolol tartrate  12.5 mg Oral BID   Continuous Infusions:    LOS: 10 days    Time spent: 30 minutes   Dessa Phi, DO [facility] [email or web address] Password [facility] 06/12/2017, 12:24 PM

## 2017-06-13 ENCOUNTER — Inpatient Hospital Stay
Admission: RE | Admit: 2017-06-13 | Discharge: 2017-06-27 | Disposition: A | Discharge status: Other Acute Inpatient Hospital | Payer: Medicare Other | Source: Ambulatory Visit | Attending: Internal Medicine | Admitting: Internal Medicine

## 2017-06-13 DIAGNOSIS — I4892 Unspecified atrial flutter: Secondary | ICD-10-CM | POA: Diagnosis present

## 2017-06-13 DIAGNOSIS — E8809 Other disorders of plasma-protein metabolism, not elsewhere classified: Secondary | ICD-10-CM

## 2017-06-13 DIAGNOSIS — I10 Essential (primary) hypertension: Secondary | ICD-10-CM | POA: Diagnosis not present

## 2017-06-13 DIAGNOSIS — Z978 Presence of other specified devices: Secondary | ICD-10-CM

## 2017-06-13 DIAGNOSIS — R1312 Dysphagia, oropharyngeal phase: Secondary | ICD-10-CM | POA: Diagnosis not present

## 2017-06-13 DIAGNOSIS — Z4659 Encounter for fitting and adjustment of other gastrointestinal appliance and device: Secondary | ICD-10-CM

## 2017-06-13 DIAGNOSIS — E119 Type 2 diabetes mellitus without complications: Secondary | ICD-10-CM | POA: Diagnosis not present

## 2017-06-13 DIAGNOSIS — I4891 Unspecified atrial fibrillation: Secondary | ICD-10-CM | POA: Diagnosis not present

## 2017-06-13 DIAGNOSIS — K317 Polyp of stomach and duodenum: Secondary | ICD-10-CM | POA: Diagnosis present

## 2017-06-13 DIAGNOSIS — R2681 Unsteadiness on feet: Secondary | ICD-10-CM | POA: Diagnosis not present

## 2017-06-13 DIAGNOSIS — Z7901 Long term (current) use of anticoagulants: Secondary | ICD-10-CM | POA: Diagnosis not present

## 2017-06-13 DIAGNOSIS — N170 Acute kidney failure with tubular necrosis: Secondary | ICD-10-CM

## 2017-06-13 DIAGNOSIS — R918 Other nonspecific abnormal finding of lung field: Secondary | ICD-10-CM | POA: Diagnosis not present

## 2017-06-13 DIAGNOSIS — D649 Anemia, unspecified: Secondary | ICD-10-CM | POA: Diagnosis not present

## 2017-06-13 DIAGNOSIS — I509 Heart failure, unspecified: Secondary | ICD-10-CM | POA: Diagnosis not present

## 2017-06-13 DIAGNOSIS — I4819 Other persistent atrial fibrillation: Secondary | ICD-10-CM | POA: Diagnosis present

## 2017-06-13 DIAGNOSIS — J9600 Acute respiratory failure, unspecified whether with hypoxia or hypercapnia: Secondary | ICD-10-CM | POA: Diagnosis not present

## 2017-06-13 DIAGNOSIS — I5042 Chronic combined systolic (congestive) and diastolic (congestive) heart failure: Secondary | ICD-10-CM | POA: Diagnosis present

## 2017-06-13 DIAGNOSIS — R52 Pain, unspecified: Secondary | ICD-10-CM | POA: Diagnosis not present

## 2017-06-13 DIAGNOSIS — E1122 Type 2 diabetes mellitus with diabetic chronic kidney disease: Secondary | ICD-10-CM | POA: Diagnosis present

## 2017-06-13 DIAGNOSIS — R278 Other lack of coordination: Secondary | ICD-10-CM | POA: Diagnosis not present

## 2017-06-13 DIAGNOSIS — A419 Sepsis, unspecified organism: Secondary | ICD-10-CM | POA: Diagnosis not present

## 2017-06-13 DIAGNOSIS — F329 Major depressive disorder, single episode, unspecified: Secondary | ICD-10-CM | POA: Diagnosis not present

## 2017-06-13 DIAGNOSIS — E785 Hyperlipidemia, unspecified: Secondary | ICD-10-CM | POA: Diagnosis present

## 2017-06-13 DIAGNOSIS — N189 Chronic kidney disease, unspecified: Secondary | ICD-10-CM

## 2017-06-13 DIAGNOSIS — K922 Gastrointestinal hemorrhage, unspecified: Secondary | ICD-10-CM

## 2017-06-13 DIAGNOSIS — I130 Hypertensive heart and chronic kidney disease with heart failure and stage 1 through stage 4 chronic kidney disease, or unspecified chronic kidney disease: Secondary | ICD-10-CM | POA: Diagnosis present

## 2017-06-13 DIAGNOSIS — N179 Acute kidney failure, unspecified: Secondary | ICD-10-CM | POA: Insufficient documentation

## 2017-06-13 DIAGNOSIS — Z950 Presence of cardiac pacemaker: Secondary | ICD-10-CM | POA: Diagnosis not present

## 2017-06-13 DIAGNOSIS — D62 Acute posthemorrhagic anemia: Secondary | ICD-10-CM

## 2017-06-13 DIAGNOSIS — E46 Unspecified protein-calorie malnutrition: Secondary | ICD-10-CM

## 2017-06-13 DIAGNOSIS — E039 Hypothyroidism, unspecified: Secondary | ICD-10-CM | POA: Diagnosis present

## 2017-06-13 DIAGNOSIS — K121 Other forms of stomatitis: Secondary | ICD-10-CM | POA: Diagnosis present

## 2017-06-13 DIAGNOSIS — I482 Chronic atrial fibrillation: Secondary | ICD-10-CM | POA: Diagnosis not present

## 2017-06-13 DIAGNOSIS — D72829 Elevated white blood cell count, unspecified: Secondary | ICD-10-CM | POA: Diagnosis not present

## 2017-06-13 DIAGNOSIS — M6281 Muscle weakness (generalized): Secondary | ICD-10-CM | POA: Diagnosis not present

## 2017-06-13 DIAGNOSIS — R531 Weakness: Secondary | ICD-10-CM | POA: Diagnosis present

## 2017-06-13 DIAGNOSIS — J9690 Respiratory failure, unspecified, unspecified whether with hypoxia or hypercapnia: Secondary | ICD-10-CM

## 2017-06-13 DIAGNOSIS — K5900 Constipation, unspecified: Secondary | ICD-10-CM | POA: Diagnosis not present

## 2017-06-13 DIAGNOSIS — K209 Esophagitis, unspecified: Secondary | ICD-10-CM | POA: Diagnosis present

## 2017-06-13 DIAGNOSIS — J189 Pneumonia, unspecified organism: Secondary | ICD-10-CM

## 2017-06-13 DIAGNOSIS — L922 Granuloma faciale [eosinophilic granuloma of skin]: Secondary | ICD-10-CM | POA: Diagnosis not present

## 2017-06-13 DIAGNOSIS — E43 Unspecified severe protein-calorie malnutrition: Secondary | ICD-10-CM | POA: Diagnosis present

## 2017-06-13 DIAGNOSIS — R1310 Dysphagia, unspecified: Secondary | ICD-10-CM | POA: Diagnosis present

## 2017-06-13 DIAGNOSIS — K219 Gastro-esophageal reflux disease without esophagitis: Secondary | ICD-10-CM | POA: Diagnosis not present

## 2017-06-13 DIAGNOSIS — I495 Sick sinus syndrome: Secondary | ICD-10-CM | POA: Diagnosis present

## 2017-06-13 DIAGNOSIS — E870 Hyperosmolality and hypernatremia: Secondary | ICD-10-CM | POA: Diagnosis not present

## 2017-06-13 DIAGNOSIS — N183 Chronic kidney disease, stage 3 (moderate): Secondary | ICD-10-CM | POA: Diagnosis not present

## 2017-06-13 DIAGNOSIS — I5023 Acute on chronic systolic (congestive) heart failure: Secondary | ICD-10-CM | POA: Diagnosis present

## 2017-06-13 DIAGNOSIS — Z4682 Encounter for fitting and adjustment of non-vascular catheter: Secondary | ICD-10-CM | POA: Diagnosis not present

## 2017-06-13 DIAGNOSIS — K449 Diaphragmatic hernia without obstruction or gangrene: Secondary | ICD-10-CM | POA: Diagnosis not present

## 2017-06-13 DIAGNOSIS — J9601 Acute respiratory failure with hypoxia: Secondary | ICD-10-CM | POA: Diagnosis not present

## 2017-06-13 DIAGNOSIS — E876 Hypokalemia: Secondary | ICD-10-CM | POA: Diagnosis present

## 2017-06-13 DIAGNOSIS — J9621 Acute and chronic respiratory failure with hypoxia: Secondary | ICD-10-CM | POA: Diagnosis not present

## 2017-06-13 DIAGNOSIS — K921 Melena: Secondary | ICD-10-CM | POA: Diagnosis not present

## 2017-06-13 DIAGNOSIS — I5022 Chronic systolic (congestive) heart failure: Secondary | ICD-10-CM | POA: Diagnosis not present

## 2017-06-13 DIAGNOSIS — E569 Vitamin deficiency, unspecified: Secondary | ICD-10-CM | POA: Diagnosis not present

## 2017-06-13 DIAGNOSIS — Z6826 Body mass index (BMI) 26.0-26.9, adult: Secondary | ICD-10-CM | POA: Diagnosis not present

## 2017-06-13 DIAGNOSIS — R0603 Acute respiratory distress: Secondary | ICD-10-CM | POA: Diagnosis not present

## 2017-06-13 DIAGNOSIS — K2970 Gastritis, unspecified, without bleeding: Secondary | ICD-10-CM | POA: Diagnosis present

## 2017-06-13 DIAGNOSIS — G894 Chronic pain syndrome: Secondary | ICD-10-CM | POA: Diagnosis present

## 2017-06-13 HISTORY — DX: Acute respiratory failure with hypoxia: J96.01

## 2017-06-13 HISTORY — DX: Atherosclerotic heart disease of native coronary artery without angina pectoris: I25.10

## 2017-06-13 HISTORY — DX: Chronic systolic (congestive) heart failure: I50.22

## 2017-06-13 HISTORY — DX: Other persistent atrial fibrillation: I48.19

## 2017-06-13 LAB — CBC
HCT: 38 % (ref 36.0–46.0)
Hemoglobin: 12.3 g/dL (ref 12.0–15.0)
MCH: 29.1 pg (ref 26.0–34.0)
MCHC: 32.4 g/dL (ref 30.0–36.0)
MCV: 89.8 fL (ref 78.0–100.0)
PLATELETS: 279 10*3/uL (ref 150–400)
RBC: 4.23 MIL/uL (ref 3.87–5.11)
RDW: 15.2 % (ref 11.5–15.5)
WBC: 29.7 10*3/uL — AB (ref 4.0–10.5)

## 2017-06-13 LAB — BASIC METABOLIC PANEL
ANION GAP: 14 (ref 5–15)
BUN: 65 mg/dL — ABNORMAL HIGH (ref 6–20)
CO2: 33 mmol/L — ABNORMAL HIGH (ref 22–32)
Calcium: 8.4 mg/dL — ABNORMAL LOW (ref 8.9–10.3)
Chloride: 94 mmol/L — ABNORMAL LOW (ref 101–111)
Creatinine, Ser: 1.5 mg/dL — ABNORMAL HIGH (ref 0.44–1.00)
GFR calc Af Amer: 38 mL/min — ABNORMAL LOW (ref >60)
GFR, EST NON AFRICAN AMERICAN: 32 mL/min — AB (ref >60)
GLUCOSE: 179 mg/dL — AB (ref 65–99)
POTASSIUM: 4 mmol/L (ref 3.5–5.1)
SODIUM: 141 mmol/L (ref 135–145)

## 2017-06-13 LAB — GLUCOSE, CAPILLARY
GLUCOSE-CAPILLARY: 156 mg/dL — AB (ref 65–99)
GLUCOSE-CAPILLARY: 234 mg/dL — AB (ref 65–99)
Glucose-Capillary: 184 mg/dL — ABNORMAL HIGH (ref 65–99)

## 2017-06-13 LAB — PROCALCITONIN: Procalcitonin: 0.2 ng/mL

## 2017-06-13 MED ORDER — FUROSEMIDE 40 MG PO TABS: 40.0000 mg | ORAL_TABLET | Freq: Two times a day (BID) | ORAL | 0 refills | Status: AC

## 2017-06-13 MED ORDER — INSULIN GLARGINE 100 UNIT/ML ~~LOC~~ SOLN: 30.0000 [IU] | Freq: Every day | SUBCUTANEOUS | 0 refills | Status: AC

## 2017-06-13 MED ORDER — INSULIN LISPRO 100 UNIT/ML ~~LOC~~ SOLN: SUBCUTANEOUS | 0 refills | Status: AC

## 2017-06-13 MED ORDER — METHYLPREDNISOLONE SODIUM SUCC 125 MG IJ SOLR: INTRAMUSCULAR | 0 refills | Status: AC

## 2017-06-13 MED ORDER — APIXABAN 5 MG PO TABS: 5.0000 mg | ORAL_TABLET | Freq: Two times a day (BID) | ORAL | 0 refills | Status: AC

## 2017-06-13 MED ORDER — PREDNISONE 10 MG PO TABS: 40.0000 mg | ORAL_TABLET | Freq: Every day | ORAL | 0 refills | Status: AC

## 2017-06-13 MED ORDER — LEVALBUTEROL HCL 0.63 MG/3ML IN NEBU: 0.6300 mg | INHALATION_SOLUTION | RESPIRATORY_TRACT | 0 refills | Status: AC | PRN

## 2017-06-13 MED ORDER — IPRATROPIUM BROMIDE 0.02 % IN SOLN: 0.5000 mg | RESPIRATORY_TRACT | 0 refills | Status: AC | PRN

## 2017-06-13 MED ORDER — METOPROLOL TARTRATE 25 MG PO TABS: 12.5000 mg | ORAL_TABLET | Freq: Two times a day (BID) | ORAL | 0 refills | Status: AC

## 2017-06-13 NOTE — Discharge Summary (Signed)
Physician Discharge Summary  Becky Forbes RCV:[id number] DOB: 1941/04/06 DOA: 06/02/2017  PCP: Binnie Rail, MD  Admit date: 06/02/2017 Discharge date: 06/13/2017  Admitted From: Home Disposition:  LTACH   Recommendations for Outpatient Follow-up:  1. Follow up with PCP in 1 week 2. Follow up with Pulmonology  3. Follow up with Cardiology Dr. Acie Fredrickson for outpatient ischemic evaluation  4. Please obtain BMP/CBC in 1 week to watch Cr and WBC.  5. Patient will need follow up CT chest as outpatient   Discharge Condition: Stable CODE STATUS: Full  Diet recommendation: Heart healthy   Brief/Interim Summary: Becky Forbes a 77 y.o.femalewith medical history significant ofsick sinus syndrome with implanted pacemaker, paroxysmal atrial fibrillation, hypothyroidism, combined diastolic and systolic congestive heart failure, chronic kidney disease, hypothyroidism, diabetes mellitus who presented to the emergency department with complaints of worsening shortness of breath,cough productive of white sputum and chest tightness. Patient reported that since December she was battling cough and shortness of breath off and on and on Friday she was traveling to [place] to see his sister, had a lot of walking around and on the way back, her daughter noticed that patient developed frequent coughs and some shortness of breath. Since Friday, her condition worsened and eventually this night she could not sleep at all and had to come to the ED for evaluation. She denied PND, orthopnea, chest pain, diaphoresis, lower extremity swelling, abdominal pain,she complained of nausea and dry heaves, but no vomiting. She was admitted for acute respiratory failure with hypoxia. Patient found to have a reduced EF, cardiology consulted and appreciated. Continues to be hypoxic and unable to wean off of bipap. Obtained CT chest w/o contrast which showed ARDS. PCCM consulted. She underwent autoimmune work up with was negative. She  was maintained on BiPAP with slow wean to HFNC O2. Amiodarone and bromocriptine were stopped due to concern for acute lung injury. She was started on high dose IV steroids and will been weaned slowly over the next 3-6 months. Cardiology started patient on IV lasix with good diuresis, transitioned to PO lasix. She was treated with levaquin for pneumonia with completion of treatment. Due to her continued respiratory needs, she will be discharged to [facility]   Discharge Diagnoses:  Principal Problem:   Acute respiratory failure ([place]) Active Problems:   HYPERLIPIDEMIA   PAF (paroxysmal atrial fibrillation) (HCC)   Chronic combined systolic and diastolic CHF (congestive heart failure) (HCC)   Paroxysmal A-fib (HCC)   Benign essential HTN   Hypothyroidism due to amiodarone   CKD (chronic kidney disease)   CAP (community acquired pneumonia)   Acute exacerbation of CHF (congestive heart failure) (HCC)   Sepsis ([place])   Acute on chronic systolic heart failure (HCC)   Persistent atrial fibrillation (HCC)   Amiodarone toxicity  Acute respiratory failure with hypoxia  -Secondary to ALI due to amiodarone toxicity/BOOP pattern -DDimer 2.43. Have ordered a VQ scan however could not be done while on BiPAP. Lower extremity doppler negative for DVT -Patient was recently placed on Eliquis however may have had blood clot prior to starting this medication -CT chest w/o contrast: suspected ARDS complicating a multilobar pneumonia or acute interstitial pneumonia.  -Autoimmune workup including Sjogrens A/B , MPO, RF, ANCA, hypersensitivity pneumonitis, ANA all negative  -PCCM consulted- recommended high dose steroids, discontinuation of amiodarone and bromocriptine planning to; IV solumderol with slow taper over next 3-4 days, then prednisone 40mg  daily for 3-6 months -Recommend BIPAP qhs to supplement respiratory muscle strength -Continue  high flow [place] O2, wean as able  Acute on chronic systolic CHF  exacerbation -Echocardiogram showed EF of 30-35% (echocardiogram December 2015 showed an EF of 50-55%) -BNP on admission 2340.8 -Cardiology consulted  -Diuresed well on IV lasix, transitioned to PO 40mg  BID per cardiology  -Monitor intake and output, daily weights  -Follow up with Dr. Acie Fredrickson and evaluate outpatient ischemic eval once stable   Sepsis secondary to pneumonia -Chest x-ray on admission showed new bilateral pulmonary infiltrates- possible pneumonia. -Upon admission, patient with leukocytosis, tachycardia and elevated lactic acid level -Blood culture shows no growth to date -Urine strep pneumonia antigen negative -CT scan noted above -Levaquin stopped. Procalcitonin 0.20  Persistent atrial fibrillation/atrial flutter -CHADSVASC 6 -TSH 0.405 -Amiodarone discontinued due to respiratory symptoms -Cardiology consulted  -Continue metoprolol  -Resume eliquis    Acute kidney injury on chronic kidney disease, stage III -Creatininepeaked at 1.95, baseline approximately 1.3-1.4 -Stable  -Hold ramipril   Essential hypertension -Ramipril held -Continue metoprolol  Diabetes mellitus, type II -Home Januvia and repaglinide held -Continue insulin sliding scale CBG monitoring, Lantus while on steroids due to hyperglycemia   Hypothyroidism -TSH 0.405 -Continue synthroid   Hyperlipidemia -Continue statin  History of GI bleed -Secondary to ischemic colitis, patient was on Coumadin at that time -Case was briefly discussed by Dr. Ree Kida with cardiologist, Dr. Acie Fredrickson, who recommended anticoagulation given her high CHADSVASC score -Closely monitor for signs and symptoms of bleeding -Continue to monitor CBC-hemoglobin appears to be stable  Sick sinus syndrome -Has dual-chamber pacemaker   Discharge Instructions  Discharge Instructions    Diet - low sodium heart healthy   Complete by:  As directed    Increase activity slowly   Complete by:  As directed       Allergies as of 06/13/2017      Reactions   Morphine And Related Itching, Nausea And Vomiting, Rash, Other (See Comments)   01/04/15- patient says she is NOT allergic to morphine.  Morphine IV given this AM 01/04/15 in the ED. No adverse reaction.   Januvia [sitagliptin Phosphate] Other (See Comments)   Pt says this caused elevated liver enzymes and creatinine. IS TAKING 1/2 TABLET   Amiodarone    Possible pulmonary toxicity   Bromocriptine    Possible contributor to pulmonary issues   Actos [pioglitazone Hydrochloride] Swelling   UNSPECIFIED REACTION    Hydrocodone-acetaminophen Itching, Nausea And Vomiting, Other (See Comments)   Per pt 05/12/15 she had this recently in the hospital with no issues at all   Keflex [cephalexin] Rash, Other (See Comments)   Burning sensation all over   Percocet [oxycodone-acetaminophen] Nausea And Vomiting      Medication List    STOP taking these medications   amiodarone 200 MG tablet Commonly known as:  PACERONE   bromocriptine 2.5 MG tablet Commonly known as:  PARLODEL   JANUVIA 100 MG tablet Generic drug:  sitaGLIPtin   ramipril 5 MG capsule Commonly known as:  ALTACE   repaglinide 2 MG tablet Commonly known as:  PRANDIN     TAKE these medications   acetaminophen 325 MG tablet Commonly known as:  TYLENOL Take 650 mg by mouth every 6 (six) hours as needed for moderate pain.   apixaban 5 MG Tabs tablet Commonly known as:  ELIQUIS Take 1 tablet (5 mg total) by mouth 2 (two) times daily.   atorvastatin 20 MG tablet Commonly known as:  LIPITOR TAKE 1 TABLET BY MOUTH  DAILY What changed:    how  much to take  how to take this  when to take this   cholecalciferol 400 units Tabs tablet Commonly known as:  VITAMIN D Take 800 Units by mouth daily.   furosemide 40 MG tablet Commonly known as:  LASIX Take 1 tablet (40 mg total) by mouth 2 (two) times daily. What changed:    medication strength  how much to take  when to  take this  reasons to take this   insulin glargine 100 UNIT/ML injection Commonly known as:  LANTUS Inject 0.3 mLs (30 Units total) into the skin daily. Start taking on:  06/14/2017   insulin lispro 100 UNIT/ML injection Commonly known as:  HUMALOG 4 units with meals PLUS meal-time sliding scale coverage 0-15 units with meals   ipratropium 0.02 % nebulizer solution Commonly known as:  ATROVENT Take 2.5 mLs (0.5 mg total) by nebulization every 4 (four) hours as needed for wheezing or shortness of breath.   levalbuterol 0.63 MG/3ML nebulizer solution Commonly known as:  XOPENEX Take 3 mLs (0.63 mg total) by nebulization every 4 (four) hours as needed for wheezing or shortness of breath.   levothyroxine 88 MCG tablet Commonly known as:  SYNTHROID, LEVOTHROID Take 1 tablet (88 mcg total) daily by mouth.   methylPREDNISolone sodium succinate 125 mg/2 mL injection Commonly known as:  SOLU-MEDROL Inject 2 mLs (125 mg total) into the vein every 12 (twelve) hours for 1 day, THEN 2 mLs (125 mg total) daily for 1 day, THEN 1 mL (62.5 mg total) daily for 1 day, THEN 0.64 mLs (40 mg total) daily for 1 day. Start taking on:  06/13/2017   metoprolol tartrate 25 MG tablet Commonly known as:  LOPRESSOR Take 0.5 tablets (12.5 mg total) by mouth 2 (two) times daily. What changed:  how much to take   predniSONE 10 MG tablet Commonly known as:  DELTASONE Take 4 tablets (40 mg total) by mouth daily. Start once IV solumedrol taper finished      Follow-up Information    Patsey Berthold, NP Follow up on 06/20/2017.   Specialty:  Cardiology Why:  Please arrive 15 minutes early for your 8:40am appointment Contact information: [place] [address] [phone]        Nahser, Wonda Cheng, MD Follow up on 08/11/2017.   Specialty:  Cardiology Why:  Please arrive 15 minutes early for your 3:40pm appointment Contact information: [place]  [address] [phone]        Brand Males, MD. Schedule an appointment as soon as possible for a visit.   Specialty:  Pulmonary Disease Contact information: [place] [address] [phone]          Allergies  Allergen Reactions  . Morphine And Related Itching, Nausea And Vomiting, Rash and Other (See Comments)    01/04/15- patient says she is NOT allergic to morphine.  Morphine IV given this AM 01/04/15 in the ED. No adverse reaction.  Celesta Gentile [Sitagliptin Phosphate] Other (See Comments)    Pt says this caused elevated liver enzymes and creatinine. IS TAKING 1/2 TABLET  . Amiodarone     Possible pulmonary toxicity  . Bromocriptine     Possible contributor to pulmonary issues  . Actos [Pioglitazone Hydrochloride] Swelling    UNSPECIFIED REACTION   . Hydrocodone-Acetaminophen Itching, Nausea And Vomiting and Other (See Comments)    Per pt 05/12/15 she had this recently in the hospital with no issues at all  .  Keflex [Cephalexin] Rash and Other (See Comments)    Burning sensation all over  . Percocet [Oxycodone-Acetaminophen] Nausea And Vomiting    Consultations:  Cardiology  PCCM    Procedures/Studies: Dg Chest 2 View  Result Date: 06/04/2017 CLINICAL DATA:  Cough, congestion, productive cough 4 days EXAM: CHEST  2 VIEW COMPARISON:  06/02/2017 FINDINGS: Persistent right upper lobe and left lower lobe airspace disease most concerning for multilobar pneumonia. No pleural effusion or pneumothorax. Stable cardiomegaly. Dual lead cardiac pacemaker. No acute osseous abnormality. IMPRESSION: Persistent right upper lobe and left lower lobe airspace disease most concerning for multilobar pneumonia. Followup PA and lateral chest X-ray is recommended in 3-4 weeks following trial of antibiotic therapy to ensure resolution and exclude underlying malignancy. Electronically Signed   By: Kathreen Devoid   On: 06/04/2017 14:41   Ct Chest Wo Contrast  Result Date:  06/06/2017 CLINICAL DATA:  Respiratory failure.  Inpatient. EXAM: CT CHEST WITHOUT CONTRAST TECHNIQUE: Multidetector CT imaging of the chest was performed following the standard protocol without IV contrast. COMPARISON:  Chest radiograph from one day prior. FINDINGS: Cardiovascular: Mild cardiomegaly, increased from prior CT abdomen study of 01/04/2015. No significant pericardial fluid/thickening. Three-vessel coronary atherosclerosis. Two lead left subclavian pacemaker with lead tips in the right atrium and right ventricular apex. Atherosclerotic nonaneurysmal thoracic aorta. Dilated main pulmonary artery (3.5 cm diameter). Mediastinum/Nodes: No discrete thyroid nodules. Unremarkable esophagus. No axillary adenopathy. Right paratracheal adenopathy measuring up to 1.8 cm (series 3/image 46). Mild bilateral hilar adenopathy, poorly delineated on the noncontrast images. Lungs/Pleura: No pneumothorax. No pleural effusion. There is diffuse thickening of the peribronchovascular interstitium in both lungs with associated mild parenchymal distortion and mild traction bronchiolectasis. There is extensive patchy ground-glass attenuation throughout both lungs with associated mild interlobular septal thickening. No discrete lung masses or significant pulmonary nodules. Patchy consolidation is present in dependent right upper lobe, lingula and dependent right lower lobe. No frank honeycombing. Upper abdomen: Small hiatal hernia. Musculoskeletal: No aggressive appearing focal osseous lesions. Moderate thoracic spondylosis. Partially visualized bilateral lumbar spinal fusion hardware. IMPRESSION: 1. Patchy consolidation in the dependent right upper lobe, lingula and dependent right lower lobe. Extensive thickening of the peribronchovascular interstitium and extensive ground-glass attenuation throughout both lungs with associated mild traction bronchiolectasis and architectural distortion. Findings are suspected to represent ARDS  complicating a multilobar pneumonia or acute interstitial pneumonia (AIP). The lungs were clear on the comparison chest radiographs dated 03/30/2014. A component of cardiogenic pulmonary edema cannot be excluded given the cardiomegaly. 2. Nonspecific mediastinal and bilateral hilar adenopathy, likely reactive. Recommend attention on follow-up chest CT with IV contrast in 3-6 months. 3. Dilated main pulmonary artery, suggesting pulmonary arterial hypertension. 4. Three-vessel coronary atherosclerosis. 5. Small hiatal hernia. Aortic Atherosclerosis (ICD10-I70.0). Electronically Signed   By: Ilona Sorrel M.D.   On: 06/06/2017 11:26   Dg Chest Port 1 View  Result Date: 06/12/2017 CLINICAL DATA:  Pneumonitis. EXAM: PORTABLE CHEST 1 VIEW COMPARISON:  Chest radiograph 06/10/2016 FINDINGS: Multi lead pacer apparatus overlies the left hemithorax. Stable cardiac and mediastinal contours. Aortic atherosclerosis. Slight interval improvement right upper lobe heterogeneous opacities. Persistent retrocardiac heterogeneous opacities. No pleural effusion or pneumothorax. IMPRESSION: Slight interval improvement right upper lobe heterogeneous opacities. Persistent left mid and lower lung heterogeneous opacities. Recommend continued radiographic followup to ensure resolution as considerations include an acute infectious process and/or underlying chronic pulmonary changes. Electronically Signed   By: Lovey Newcomer M.D.   On: 06/12/2017 08:11   Dg [facility]  Result Date: 06/10/2017 CLINICAL DATA:  77 year old female with shortness of breath. Amiodarone neurotoxicity. Subsequent encounter. EXAM: PORTABLE CHEST 1 VIEW COMPARISON:  06/09/2017 and 03/28/2014 chest x-ray. 06/06/2017 chest CT. FINDINGS: Similar appearance of asymmetric diffuse airspace disease. There may be a component of underlying chronic lung changes from amiodarone toxicity however, superimposed infectious infiltrate/pulmonary edema not excluded in the proper  clinical setting. This represents a distinct change from 03/28/2014. After treatment of any clinically suspected infectious infiltrate or pulmonary edema, patient would benefit from follow-up chest x-ray to establish baseline. Cardiomegaly with sequential pacemaker in place.  No pneumothorax. Calcified aorta. IMPRESSION: Similar appearance to recent chest x-ray. Question asymmetric pulmonary edema/infiltrates superimposed upon chronic changes as detailed above. Electronically Signed   By: Genia Del M.D.   On: 06/10/2017 08:32   Dg Chest Port 1 View  Result Date: 06/09/2017 CLINICAL DATA:  Cough for several months.  Amiodarone toxicity EXAM: PORTABLE CHEST 1 VIEW COMPARISON:  Chest radiograph June 05, 2017 and chest CT June 06, 2017 FINDINGS: There is extensive pulmonary fibrotic type change. Patchy consolidation is noted in the right upper lobe and left lower lobe regions, stable. No new opacity evident. There is cardiomegaly with pulmonary vascularity within normal limits. There is aortic atherosclerosis. Pacemaker leads are attached to the right atrium and right ventricle. No bone lesions. IMPRESSION: Underlying pulmonary fibrosis, extensive. Areas of patchy consolidation in the right upper lobe and left lower lobe regions. No new opacity. Stable cardiomegaly. There is aortic atherosclerosis. Aortic Atherosclerosis (ICD10-I70.0). Electronically Signed   By: Lowella Grip III M.D.   On: 06/09/2017 07:40   Dg Chest Port 1 View  Result Date: 06/05/2017 CLINICAL DATA:  Shortness of Breath EXAM: PORTABLE CHEST 1 VIEW COMPARISON:  06/04/2017 FINDINGS: Cardiac shadow is enlarged but stable. Pacing device is again seen. Persistent right upper lobe infiltrate is noted. Persistent changes in the left base are noted as well. No new focal infiltrate or effusion is seen. No bony abnormality is noted. IMPRESSION: Stable right upper and left lower lobe infiltrates. Electronically Signed   By: Inez Catalina  M.D.   On: 06/05/2017 07:59   Dg Chest Port 1 View  Result Date: 06/02/2017 CLINICAL DATA:  Shortness of breath. Chest tightness. Productive cough. EXAM: PORTABLE CHEST 1 VIEW COMPARISON:  03/30/2014 FINDINGS: There are new hazy infiltrates with interstitial accentuation in the right upper lobe and in the left mid and lower lung zones. Overall heart size and pulmonary vascularity are normal. Aortic atherosclerosis. No definitive pleural effusions.  No acute bone abnormality. IMPRESSION: New bilateral pulmonary infiltrates. Pneumonia should be considered, given the lack of pulmonary vascular congestion the. Aortic Atherosclerosis (ICD10-I70.0). Electronically Signed   By: Lorriane Shire M.D.   On: 06/02/2017 11:25    Echo Study Conclusions  - Left ventricle: The cavity size was mildly dilated. Systolic   function was moderately to severely reduced. The estimated   ejection fraction was in the range of 30% to 35%. Diffuse   hypokinesis. - Aortic valve: Noncoronary cusp mobility was restricted.   Transvalvular velocity was within the normal range. There was no   stenosis. There was no regurgitation. - Mitral valve: Mildly calcified annulus. Transvalvular velocity   was within the normal range. There was no evidence for stenosis.   There was mild to moderate regurgitation. - Left atrium: The atrium was severely dilated. - Right ventricle: The cavity size was normal. Wall thickness was   normal. Systolic function was normal. - Right atrium: The  atrium was severely dilated. - Atrial septum: No defect or patent foramen ovale was identified   by color flow Doppler. - Tricuspid valve: There was mild regurgitation. - Pulmonary arteries: Systolic pressure was within the normal   range. PA peak pressure: 27 mm Hg (S).  Impressions:  - Compared with the echo 16/10/96, systolic function is reduced.    Discharge Exam: Vitals:   06/13/17 0745 06/13/17 0911  BP: 118/74   Pulse: 80   Resp:  14   Temp: 97.9 F (36.6 C)   SpO2: 95% 92%   Vitals:   06/13/17 0338 06/13/17 0437 06/13/17 0745 06/13/17 0911  BP:  138/87 118/74   Pulse:  80 80   Resp:  20 14   Temp:  97.6 F (36.4 C) 97.9 F (36.6 C)   TempSrc:  Axillary Oral   SpO2: 94% 93% 95% 92%  Weight:  68.8 kg (151 lb 10.8 oz)    Height:        General: Pt is alert, awake, not in acute distress Cardiovascular: Irreg rhythm, rate controlled, S1/S2 +, no rubs, no gallops Respiratory: +Bilateral crackles, left worse than right, on HFNC and no respiratory distress Abdominal: Soft, NT, ND, bowel sounds + Extremities: no edema, no cyanosis    The results of significant diagnostics from this hospitalization (including imaging, microbiology, ancillary and laboratory) are listed below for reference.     Microbiology: Recent Results (from the past 240 hour(s))  Respiratory Panel by PCR     Status: None   Collection Time: 06/03/17 10:49 AM  Result Value Ref Range Status   Adenovirus NOT DETECTED NOT DETECTED Final   Coronavirus 229E NOT DETECTED NOT DETECTED Final   Coronavirus HKU1 NOT DETECTED NOT DETECTED Final   Coronavirus NL63 NOT DETECTED NOT DETECTED Final   Coronavirus OC43 NOT DETECTED NOT DETECTED Final   Metapneumovirus NOT DETECTED NOT DETECTED Final   Rhinovirus / Enterovirus NOT DETECTED NOT DETECTED Final   Influenza A NOT DETECTED NOT DETECTED Final   Influenza B NOT DETECTED NOT DETECTED Final   Parainfluenza Virus 1 NOT DETECTED NOT DETECTED Final   Parainfluenza Virus 2 NOT DETECTED NOT DETECTED Final   Parainfluenza Virus 3 NOT DETECTED NOT DETECTED Final   Parainfluenza Virus 4 NOT DETECTED NOT DETECTED Final   Respiratory Syncytial Virus NOT DETECTED NOT DETECTED Final   Bordetella pertussis NOT DETECTED NOT DETECTED Final   Chlamydophila pneumoniae NOT DETECTED NOT DETECTED Final   Mycoplasma pneumoniae NOT DETECTED NOT DETECTED Final    Comment: Performed at [facility], [address], [place], [place] [address]  MRSA PCR Screening     Status: None   Collection Time: 06/05/17 12:01 AM  Result Value Ref Range Status   MRSA by PCR NEGATIVE NEGATIVE Final    Comment:        The GeneXpert MRSA Assay (FDA approved for NASAL specimens only), is one component of a comprehensive MRSA colonization surveillance program. It is not intended to diagnose MRSA infection nor to guide or monitor treatment for MRSA infections. Performed at [facility], [place] [address], [place], [place] [address]      Labs: BNP (last 3 results) Recent Labs    06/02/17 1123  BNP 1,478.2*   Basic Metabolic Panel: Recent Labs  Lab 06/08/17 0440 06/09/17 0438 06/10/17 0838 06/11/17 0403 06/12/17 0327 06/13/17 0358  NA 134* 137 139 139 140 141  K 3.5 3.3* 4.5 3.8 3.7 4.0  CL  93* 95* 96* 95* 94* 94*  CO2 27 28 29  33* 33* 33*  GLUCOSE 312* 333* 424* 299* 166* 179*  BUN 71* 74* 63* 59* 62* 65*  CREATININE 1.72* 1.57* 1.51* 1.41* 1.53* 1.50*  CALCIUM 8.1* 8.2* 8.7* 8.7* 8.6* 8.4*  MG 2.0  --   --   --   --   --    Liver Function Tests: No results for input(s): AST, ALT, ALKPHOS, BILITOT, PROT, ALBUMIN in the last 168 hours. No results for input(s): LIPASE, AMYLASE in the last 168 hours. No results for input(s): AMMONIA in the last 168 hours. CBC: Recent Labs  Lab 06/09/17 0438 06/10/17 0332 06/11/17 0403 06/12/17 0327 06/13/17 0358  WBC 17.0* 17.5* 20.0* 27.9* 29.7*  HGB 10.6* 11.1* 11.1* 11.9* 12.3  HCT 32.8* 34.8* 35.6* 36.7 38.0  MCV 90.4 90.2 91.3 90.2 89.8  PLT 337 341 321 313 279   Cardiac Enzymes: No results for input(s): CKTOTAL, CKMB, CKMBINDEX, TROPONINI in the last 168 hours. BNP: Invalid input(s): POCBNP CBG: Recent Labs  Lab 06/12/17 1649 06/12/17 2003 06/13/17 0000 06/13/17 0439 06/13/17 0743  GLUCAP 167* 231* 143* 184* 156*   D-Dimer No results for input(s): DDIMER in the last 72 hours. Hgb A1c No results for input(s): HGBA1C in the  last 72 hours. Lipid Profile No results for input(s): CHOL, HDL, LDLCALC, TRIG, CHOLHDL, LDLDIRECT in the last 72 hours. Thyroid function studies No results for input(s): TSH, T4TOTAL, T3FREE, THYROIDAB in the last 72 hours.  Invalid input(s): FREET3 Anemia work up No results for input(s): VITAMINB12, FOLATE, FERRITIN, TIBC, IRON, RETICCTPCT in the last 72 hours. Urinalysis    Component Value Date/Time   COLORURINE YELLOW 01/04/2015 1620   APPEARANCEUR CLEAR 01/04/2015 1620   LABSPEC 1.017 01/04/2015 1620   PHURINE 5.0 01/04/2015 1620   GLUCOSEU NEGATIVE 01/04/2015 1620   HGBUR TRACE (A) 01/04/2015 1620   BILIRUBINUR NEGATIVE 01/04/2015 1620   BILIRUBINUR negative 08/05/2012 1435   KETONESUR NEGATIVE 01/04/2015 1620   PROTEINUR NEGATIVE 01/04/2015 1620   UROBILINOGEN 0.2 01/04/2015 1620   NITRITE NEGATIVE 01/04/2015 1620   LEUKOCYTESUR TRACE (A) 01/04/2015 1620   Sepsis Labs Invalid input(s): PROCALCITONIN,  WBC,  LACTICIDVEN Microbiology Recent Results (from the past 240 hour(s))  Respiratory Panel by PCR     Status: None   Collection Time: 06/03/17 10:49 AM  Result Value Ref Range Status   Adenovirus NOT DETECTED NOT DETECTED Final   Coronavirus 229E NOT DETECTED NOT DETECTED Final   Coronavirus HKU1 NOT DETECTED NOT DETECTED Final   Coronavirus NL63 NOT DETECTED NOT DETECTED Final   Coronavirus OC43 NOT DETECTED NOT DETECTED Final   Metapneumovirus NOT DETECTED NOT DETECTED Final   Rhinovirus / Enterovirus NOT DETECTED NOT DETECTED Final   Influenza A NOT DETECTED NOT DETECTED Final   Influenza B NOT DETECTED NOT DETECTED Final   Parainfluenza Virus 1 NOT DETECTED NOT DETECTED Final   Parainfluenza Virus 2 NOT DETECTED NOT DETECTED Final   Parainfluenza Virus 3 NOT DETECTED NOT DETECTED Final   Parainfluenza Virus 4 NOT DETECTED NOT DETECTED Final   Respiratory Syncytial Virus NOT DETECTED NOT DETECTED Final   Bordetella pertussis NOT DETECTED NOT DETECTED Final    Chlamydophila pneumoniae NOT DETECTED NOT DETECTED Final   Mycoplasma pneumoniae NOT DETECTED NOT DETECTED Final    Comment: Performed at [facility], [place] [address], [place], [place] [address]  MRSA PCR Screening     Status: None   Collection Time: 06/05/17 12:01  AM  Result Value Ref Range Status   MRSA by PCR NEGATIVE NEGATIVE Final    Comment:        The GeneXpert MRSA Assay (FDA approved for NASAL specimens only), is one component of a comprehensive MRSA colonization surveillance program. It is not intended to diagnose MRSA infection nor to guide or monitor treatment for MRSA infections. Performed at [facility], [place] [address], [place], [place] [address]      Patient was seen and examined on the day of discharge and was found to be in stable condition. Time coordinating discharge: 40 minutes including assessment and coordination of care, as well as examination of the patient.   SIGNED:  Dessa Phi, DO [facility] Pager [phone]  If 7PM-7AM, please contact night-coverage [email or web address] Password [email or web address] 06/13/2017, 9:58 AM

## 2017-06-13 NOTE — Evaluation (Signed)
Occupational Therapy Evaluation Patient Details Name: Becky Forbes MRN: [id number] DOB: 1940/11/10 Today's Date: 06/13/2017    History of Present Illness 77yo female presenting to the ED with increased shortness of breath, productive cough. She was admitted to the hospital for acute respiratory failure with hypoxia, acute on chronic CHF exacerbation. PMH CAD, CHF, CKD, DM, hx GI bleed, HTN, hypothyroidism, pacemaker, renal insufficiency, sick sinus syndrome, atrial flutter, hx back surgery, carpal tunnel, hx foot surgery, hx joint replacement, lumbar laminectomy, osteotomy with fusion    Clinical Impression   Pt reports she was very independent PTA. Currently pt overall min-mod assist for ADL and mod assist for very limited functional mobility. Pt presenting with decreased activity tolerance, poor balance, and generalized weakness impacting her independence and safety with ADL and functional mobility. SpO2 >80% on 15L HFNC; educated on pursed lip breathing, posture, and importance of EOB and OOB activities daily. Recommending LTACH for follow up. Pt would benefit from continued skilled OT to address established goals.    Follow Up Recommendations  LTACH    Equipment Recommendations  Other (comment)(TBD)    Recommendations for Other Services       Precautions / Restrictions Precautions Precautions: Fall;Other (comment) Precaution Comments: watch SpO2- desaturates easily  Restrictions Weight Bearing Restrictions: No      Mobility Bed Mobility Overal bed mobility: Needs Assistance Bed Mobility: Supine to Sit;Sit to Supine;Rolling Rolling: Supervision   Supine to sit: Min guard Sit to supine: Min assist   General bed mobility comments: Assist for LEs back to bed. HOB slightly elevated, increased time and effort  Transfers Overall transfer level: Needs assistance Equipment used: 1 person hand held assist Transfers: Sit to/from Stand Sit to Stand: Min assist;Mod assist         General transfer comment: Min assist for first sit to stand, mod for second attempt    Balance Overall balance assessment: Needs assistance Sitting-balance support: Feet supported Sitting balance-Leahy Scale: Good     Standing balance support: Bilateral upper extremity supported Standing balance-Leahy Scale: Poor                             ADL either performed or assessed with clinical judgement   ADL Overall ADL's : Needs assistance/impaired Eating/Feeding: Set up   Grooming: Set up;Supervision/safety;Sitting   Upper Body Bathing: Minimal assistance;Sitting   Lower Body Bathing: Moderate assistance;Sitting/lateral leans   Upper Body Dressing : Minimal assistance;Sitting   Lower Body Dressing: Moderate assistance;Sit to/from stand               Functional mobility during ADLs: Moderate assistance(HHA, for sit to stand and side stepping at EOB) General ADL Comments: SpO2 >80% on 15L HFNC; sustained in mid to high 80s. Educated on pursed lip breathing and importance of EOB or OOB activity.      Vision         Perception     Praxis      Pertinent Vitals/Pain Pain Assessment: No/denies pain     Hand Dominance     Extremity/Trunk Assessment Upper Extremity Assessment Upper Extremity Assessment: Generalized weakness   Lower Extremity Assessment Lower Extremity Assessment: Defer to PT evaluation   Cervical / Trunk Assessment Cervical / Trunk Assessment: Kyphotic   Communication Communication Communication: HOH   Cognition Arousal/Alertness: Awake/alert Behavior During Therapy: WFL for tasks assessed/performed Overall Cognitive Status: Within Functional Limits for tasks assessed  General Comments       Exercises     Shoulder Instructions      Home Living Family/patient expects to be discharged to:: Private residence Living Arrangements: Spouse/significant other Available Help at  Discharge: Family;Available 24 hours/day Type of Home: House Home Access: Stairs to enter [employer] of Steps: 2 Entrance Stairs-Rails: None Home Layout: Two level;Able to live on main level with bedroom/bathroom     Bathroom Shower/Tub: Teacher, early years/pre: Standard     Home Equipment: Environmental consultant - 2 wheels;Cane - single point;Bedside commode;Grab bars - tub/shower          Prior Functioning/Environment Level of Independence: Independent        Comments: very independent at baseline, reports she does not use device and was doing well before she was admitted to the hospital, 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        OT Problem List: Decreased strength;Decreased activity tolerance;Impaired balance (sitting and/or standing);Decreased knowledge of use of DME or AE;Cardiopulmonary status limiting activity      OT Treatment/Interventions: Self-care/ADL training;Therapeutic exercise;Energy conservation;DME and/or AE instruction;Therapeutic activities;Patient/family education;Balance training    OT Goals(Current goals can be found in the care plan section) Acute Rehab OT Goals Patient Stated Goal: get better and back home OT Goal Formulation: With patient Time For Goal Achievement: 06/27/17 Potential to Achieve Goals: Good ADL Goals Pt Will Perform Upper Body Bathing: with set-up;with supervision;sitting Pt Will Perform Lower Body Bathing: with min assist;sit to/from stand Pt Will Transfer to Toilet: with min assist;stand pivot transfer;bedside commode Additional ADL Goal #1: Pt will independently verbally recall 3 energy conservation strategies and utilize during ADL.  OT Frequency: Min 2X/week   Barriers to D/C:            Co-evaluation              AM-PAC PT "6 Clicks" Daily Activity     Outcome Measure Help from another person eating meals?: A Little Help from another person taking care of personal grooming?:  A Little Help from another person toileting, which includes using toliet, bedpan, or urinal?: A Lot Help from another person bathing (including washing, rinsing, drying)?: A Lot Help from another person to put on and taking off regular upper body clothing?: A Little Help from another person to put on and taking off regular lower body clothing?: A Lot 6 Click Score: 15   End of Session Equipment Utilized During Treatment: Oxygen  Activity Tolerance: Patient tolerated treatment well Patient left: in bed;with call bell/phone within reach;with family/visitor present  OT Visit Diagnosis: Unsteadiness on feet (R26.81);Muscle weakness (generalized) (M62.81)                Time: 3474-2595 OT Time Calculation (min): 21 min Charges:  OT General Charges $OT Visit: 1 Visit OT Evaluation $OT Eval Moderate Complexity: 1 Mod G-Codes:     Sanav Remer A. Ulice Brilliant, M.S., OTR/L Pager: [place] 06/13/2017, 1:49 PM

## 2017-06-14 ENCOUNTER — Other Ambulatory Visit (HOSPITAL_COMMUNITY): Payer: Self-pay

## 2017-06-14 DIAGNOSIS — J9690 Respiratory failure, unspecified, unspecified whether with hypoxia or hypercapnia: Secondary | ICD-10-CM | POA: Diagnosis not present

## 2017-06-14 LAB — BRAIN NATRIURETIC PEPTIDE: B Natriuretic Peptide: 244.3 pg/mL — ABNORMAL HIGH (ref 0.0–100.0)

## 2017-06-14 LAB — MAGNESIUM: Magnesium: 2 mg/dL (ref 1.7–2.4)

## 2017-06-14 LAB — CBC WITH DIFFERENTIAL/PLATELET
BASOS ABS: 0 10*3/uL (ref 0.0–0.1)
BASOS PCT: 0 %
EOS ABS: 0 10*3/uL (ref 0.0–0.7)
Eosinophils Relative: 0 %
HCT: 37.8 % (ref 36.0–46.0)
Hemoglobin: 12.3 g/dL (ref 12.0–15.0)
Lymphocytes Relative: 5 %
Lymphs Abs: 1.6 10*3/uL (ref 0.7–4.0)
MCH: 29.2 pg (ref 26.0–34.0)
MCHC: 32.5 g/dL (ref 30.0–36.0)
MCV: 89.8 fL (ref 78.0–100.0)
MONO ABS: 0.3 10*3/uL (ref 0.1–1.0)
Monocytes Relative: 1 %
NEUTROS ABS: 30.7 10*3/uL — AB (ref 1.7–7.7)
NEUTROS PCT: 94 %
PLATELETS: 290 10*3/uL (ref 150–400)
RBC: 4.21 MIL/uL (ref 3.87–5.11)
RDW: 15.2 % (ref 11.5–15.5)
WBC: 32.6 10*3/uL — ABNORMAL HIGH (ref 4.0–10.5)

## 2017-06-14 LAB — URINALYSIS, ROUTINE W REFLEX MICROSCOPIC
Bilirubin Urine: NEGATIVE
Glucose, UA: NEGATIVE mg/dL
HGB URINE DIPSTICK: NEGATIVE
Ketones, ur: NEGATIVE mg/dL
LEUKOCYTES UA: NEGATIVE
NITRITE: NEGATIVE
PROTEIN: NEGATIVE mg/dL
Specific Gravity, Urine: 1.015 (ref 1.005–1.030)
pH: 5 (ref 5.0–8.0)

## 2017-06-14 LAB — COMPREHENSIVE METABOLIC PANEL
ALK PHOS: 111 U/L (ref 38–126)
ALT: 24 U/L (ref 14–54)
AST: 32 U/L (ref 15–41)
Albumin: 1.9 g/dL — ABNORMAL LOW (ref 3.5–5.0)
Anion gap: 15 (ref 5–15)
BILIRUBIN TOTAL: 1.3 mg/dL — AB (ref 0.3–1.2)
BUN: 80 mg/dL — ABNORMAL HIGH (ref 6–20)
CALCIUM: 8.4 mg/dL — AB (ref 8.9–10.3)
CO2: 32 mmol/L (ref 22–32)
CREATININE: 1.72 mg/dL — AB (ref 0.44–1.00)
Chloride: 93 mmol/L — ABNORMAL LOW (ref 101–111)
GFR calc non Af Amer: 27 mL/min — ABNORMAL LOW (ref >60)
GFR, EST AFRICAN AMERICAN: 32 mL/min — AB (ref >60)
Glucose, Bld: 136 mg/dL — ABNORMAL HIGH (ref 65–99)
Potassium: 3.4 mmol/L — ABNORMAL LOW (ref 3.5–5.1)
SODIUM: 140 mmol/L (ref 135–145)
TOTAL PROTEIN: 6.5 g/dL (ref 6.5–8.1)

## 2017-06-14 LAB — HEMOGLOBIN A1C
Hgb A1c MFr Bld: 8.3 % — ABNORMAL HIGH (ref 4.8–5.6)
Mean Plasma Glucose: 191.51 mg/dL

## 2017-06-14 LAB — PROTIME-INR
INR: 1.75
PROTHROMBIN TIME: 20.3 s — AB (ref 11.4–15.2)

## 2017-06-14 LAB — TSH: TSH: 0.308 u[IU]/mL — ABNORMAL LOW (ref 0.350–4.500)

## 2017-06-15 LAB — CBC
HEMATOCRIT: 39.3 % (ref 36.0–46.0)
HEMOGLOBIN: 12.8 g/dL (ref 12.0–15.0)
MCH: 29.2 pg (ref 26.0–34.0)
MCHC: 32.6 g/dL (ref 30.0–36.0)
MCV: 89.7 fL (ref 78.0–100.0)
Platelets: 264 10*3/uL (ref 150–400)
RBC: 4.38 MIL/uL (ref 3.87–5.11)
RDW: 15.5 % (ref 11.5–15.5)
WBC: 30.7 10*3/uL — ABNORMAL HIGH (ref 4.0–10.5)

## 2017-06-15 LAB — BASIC METABOLIC PANEL
ANION GAP: 15 (ref 5–15)
BUN: 95 mg/dL — ABNORMAL HIGH (ref 6–20)
CO2: 30 mmol/L (ref 22–32)
Calcium: 8.3 mg/dL — ABNORMAL LOW (ref 8.9–10.3)
Chloride: 90 mmol/L — ABNORMAL LOW (ref 101–111)
Creatinine, Ser: 1.8 mg/dL — ABNORMAL HIGH (ref 0.44–1.00)
GFR, EST AFRICAN AMERICAN: 30 mL/min — AB (ref >60)
GFR, EST NON AFRICAN AMERICAN: 26 mL/min — AB (ref >60)
GLUCOSE: 328 mg/dL — AB (ref 65–99)
POTASSIUM: 3.7 mmol/L (ref 3.5–5.1)
Sodium: 135 mmol/L (ref 135–145)

## 2017-06-16 LAB — URINE CULTURE

## 2017-06-16 LAB — BASIC METABOLIC PANEL
ANION GAP: 15 (ref 5–15)
BUN: 96 mg/dL — ABNORMAL HIGH (ref 6–20)
CALCIUM: 8.1 mg/dL — AB (ref 8.9–10.3)
CO2: 29 mmol/L (ref 22–32)
Chloride: 93 mmol/L — ABNORMAL LOW (ref 101–111)
Creatinine, Ser: 1.76 mg/dL — ABNORMAL HIGH (ref 0.44–1.00)
GFR calc Af Amer: 31 mL/min — ABNORMAL LOW (ref >60)
GFR, EST NON AFRICAN AMERICAN: 27 mL/min — AB (ref >60)
GLUCOSE: 351 mg/dL — AB (ref 65–99)
Potassium: 4.1 mmol/L (ref 3.5–5.1)
SODIUM: 137 mmol/L (ref 135–145)

## 2017-06-16 LAB — MAGNESIUM: Magnesium: 2.2 mg/dL (ref 1.7–2.4)

## 2017-06-17 LAB — CBC
HEMATOCRIT: 40.8 % (ref 36.0–46.0)
Hemoglobin: 13.4 g/dL (ref 12.0–15.0)
MCH: 29.1 pg (ref 26.0–34.0)
MCHC: 32.8 g/dL (ref 30.0–36.0)
MCV: 88.7 fL (ref 78.0–100.0)
Platelets: 232 10*3/uL (ref 150–400)
RBC: 4.6 MIL/uL (ref 3.87–5.11)
RDW: 15.2 % (ref 11.5–15.5)
WBC: 30.4 10*3/uL — ABNORMAL HIGH (ref 4.0–10.5)

## 2017-06-17 LAB — PHOSPHORUS: Phosphorus: 3.8 mg/dL (ref 2.5–4.6)

## 2017-06-17 LAB — BASIC METABOLIC PANEL
Anion gap: 11 (ref 5–15)
BUN: 85 mg/dL — ABNORMAL HIGH (ref 6–20)
CALCIUM: 8.3 mg/dL — AB (ref 8.9–10.3)
CO2: 32 mmol/L (ref 22–32)
CREATININE: 1.34 mg/dL — AB (ref 0.44–1.00)
Chloride: 98 mmol/L — ABNORMAL LOW (ref 101–111)
GFR calc Af Amer: 43 mL/min — ABNORMAL LOW (ref >60)
GFR calc non Af Amer: 37 mL/min — ABNORMAL LOW (ref >60)
Glucose, Bld: 80 mg/dL (ref 65–99)
Potassium: 4 mmol/L (ref 3.5–5.1)
Sodium: 141 mmol/L (ref 135–145)

## 2017-06-17 LAB — MAGNESIUM: Magnesium: 2.3 mg/dL (ref 1.7–2.4)

## 2017-06-18 ENCOUNTER — Other Ambulatory Visit (HOSPITAL_COMMUNITY): Payer: Self-pay

## 2017-06-18 DIAGNOSIS — J189 Pneumonia, unspecified organism: Secondary | ICD-10-CM | POA: Diagnosis not present

## 2017-06-18 LAB — CBC
HCT: 43.4 % (ref 36.0–46.0)
HEMOGLOBIN: 14.1 g/dL (ref 12.0–15.0)
MCH: 29.1 pg (ref 26.0–34.0)
MCHC: 32.5 g/dL (ref 30.0–36.0)
MCV: 89.7 fL (ref 78.0–100.0)
PLATELETS: 227 10*3/uL (ref 150–400)
RBC: 4.84 MIL/uL (ref 3.87–5.11)
RDW: 15.6 % — ABNORMAL HIGH (ref 11.5–15.5)
WBC: 36.2 10*3/uL — ABNORMAL HIGH (ref 4.0–10.5)

## 2017-06-18 LAB — RENAL FUNCTION PANEL
ALBUMIN: 2 g/dL — AB (ref 3.5–5.0)
ANION GAP: 13 (ref 5–15)
BUN: 99 mg/dL — ABNORMAL HIGH (ref 6–20)
CALCIUM: 8.3 mg/dL — AB (ref 8.9–10.3)
CO2: 32 mmol/L (ref 22–32)
Chloride: 96 mmol/L — ABNORMAL LOW (ref 101–111)
Creatinine, Ser: 1.68 mg/dL — ABNORMAL HIGH (ref 0.44–1.00)
GFR calc Af Amer: 33 mL/min — ABNORMAL LOW (ref >60)
GFR, EST NON AFRICAN AMERICAN: 28 mL/min — AB (ref >60)
Glucose, Bld: 61 mg/dL — ABNORMAL LOW (ref 65–99)
PHOSPHORUS: 5 mg/dL — AB (ref 2.5–4.6)
POTASSIUM: 4.4 mmol/L (ref 3.5–5.1)
SODIUM: 141 mmol/L (ref 135–145)

## 2017-06-18 LAB — OCCULT BLOOD X 1 CARD TO LAB, STOOL: Fecal Occult Bld: POSITIVE — AB

## 2017-06-18 LAB — PROTIME-INR
INR: 1.67
PROTHROMBIN TIME: 19.6 s — AB (ref 11.4–15.2)

## 2017-06-18 LAB — MAGNESIUM: MAGNESIUM: 2.4 mg/dL (ref 1.7–2.4)

## 2017-06-19 ENCOUNTER — Other Ambulatory Visit (HOSPITAL_COMMUNITY): Payer: Self-pay

## 2017-06-19 DIAGNOSIS — N183 Chronic kidney disease, stage 3 (moderate): Secondary | ICD-10-CM | POA: Diagnosis not present

## 2017-06-19 LAB — URINALYSIS, ROUTINE W REFLEX MICROSCOPIC
Bilirubin Urine: NEGATIVE
Glucose, UA: NEGATIVE mg/dL
Hgb urine dipstick: NEGATIVE
KETONES UR: NEGATIVE mg/dL
LEUKOCYTES UA: NEGATIVE
NITRITE: NEGATIVE
Protein, ur: NEGATIVE mg/dL
Specific Gravity, Urine: 1.016 (ref 1.005–1.030)
pH: 5 (ref 5.0–8.0)

## 2017-06-19 LAB — RENAL FUNCTION PANEL
ALBUMIN: 1.9 g/dL — AB (ref 3.5–5.0)
ANION GAP: 16 — AB (ref 5–15)
BUN: 126 mg/dL — AB (ref 6–20)
CALCIUM: 8.1 mg/dL — AB (ref 8.9–10.3)
CO2: 31 mmol/L (ref 22–32)
Chloride: 92 mmol/L — ABNORMAL LOW (ref 101–111)
Creatinine, Ser: 2.19 mg/dL — ABNORMAL HIGH (ref 0.44–1.00)
GFR calc Af Amer: 24 mL/min — ABNORMAL LOW (ref >60)
GFR, EST NON AFRICAN AMERICAN: 21 mL/min — AB (ref >60)
GLUCOSE: 175 mg/dL — AB (ref 65–99)
PHOSPHORUS: 6.4 mg/dL — AB (ref 2.5–4.6)
POTASSIUM: 5.2 mmol/L — AB (ref 3.5–5.1)
Sodium: 139 mmol/L (ref 135–145)

## 2017-06-19 LAB — CBC
HCT: 45.1 % (ref 36.0–46.0)
Hemoglobin: 14.5 g/dL (ref 12.0–15.0)
MCH: 29 pg (ref 26.0–34.0)
MCHC: 32.2 g/dL (ref 30.0–36.0)
MCV: 90.2 fL (ref 78.0–100.0)
Platelets: 223 10*3/uL (ref 150–400)
RBC: 5 MIL/uL (ref 3.87–5.11)
RDW: 15.7 % — AB (ref 11.5–15.5)
WBC: 36.1 10*3/uL — AB (ref 4.0–10.5)

## 2017-06-19 LAB — MAGNESIUM: MAGNESIUM: 2.6 mg/dL — AB (ref 1.7–2.4)

## 2017-06-19 LAB — PHOSPHORUS: PHOSPHORUS: 6.2 mg/dL — AB (ref 2.5–4.6)

## 2017-06-20 ENCOUNTER — Other Ambulatory Visit (HOSPITAL_COMMUNITY): Payer: Self-pay

## 2017-06-20 ENCOUNTER — Ambulatory Visit: Payer: Medicare Other | Admitting: Nurse Practitioner

## 2017-06-20 DIAGNOSIS — Z4682 Encounter for fitting and adjustment of non-vascular catheter: Secondary | ICD-10-CM | POA: Diagnosis not present

## 2017-06-20 LAB — RENAL FUNCTION PANEL
ANION GAP: 14 (ref 5–15)
Albumin: 1.8 g/dL — ABNORMAL LOW (ref 3.5–5.0)
BUN: 141 mg/dL — ABNORMAL HIGH (ref 6–20)
CHLORIDE: 92 mmol/L — AB (ref 101–111)
CO2: 27 mmol/L (ref 22–32)
Calcium: 7.7 mg/dL — ABNORMAL LOW (ref 8.9–10.3)
Creatinine, Ser: 2.46 mg/dL — ABNORMAL HIGH (ref 0.44–1.00)
GFR calc non Af Amer: 18 mL/min — ABNORMAL LOW (ref >60)
GFR, EST AFRICAN AMERICAN: 21 mL/min — AB (ref >60)
Glucose, Bld: 479 mg/dL — ABNORMAL HIGH (ref 65–99)
POTASSIUM: 5.6 mmol/L — AB (ref 3.5–5.1)
Phosphorus: 6.3 mg/dL — ABNORMAL HIGH (ref 2.5–4.6)
Sodium: 133 mmol/L — ABNORMAL LOW (ref 135–145)

## 2017-06-20 LAB — CBC
HEMATOCRIT: 43 % (ref 36.0–46.0)
Hemoglobin: 14.1 g/dL (ref 12.0–15.0)
MCH: 29.1 pg (ref 26.0–34.0)
MCHC: 32.8 g/dL (ref 30.0–36.0)
MCV: 88.7 fL (ref 78.0–100.0)
Platelets: 191 10*3/uL (ref 150–400)
RBC: 4.85 MIL/uL (ref 3.87–5.11)
RDW: 15.4 % (ref 11.5–15.5)
WBC: 42.2 10*3/uL — ABNORMAL HIGH (ref 4.0–10.5)

## 2017-06-20 LAB — LACTIC ACID, PLASMA: Lactic Acid, Venous: 2 mmol/L (ref 0.5–1.9)

## 2017-06-20 LAB — MAGNESIUM: Magnesium: 2.3 mg/dL (ref 1.7–2.4)

## 2017-06-21 ENCOUNTER — Other Ambulatory Visit (HOSPITAL_COMMUNITY): Payer: Self-pay

## 2017-06-21 ENCOUNTER — Telehealth: Payer: Self-pay | Admitting: Family Medicine

## 2017-06-21 DIAGNOSIS — Z4682 Encounter for fitting and adjustment of non-vascular catheter: Secondary | ICD-10-CM | POA: Diagnosis not present

## 2017-06-21 LAB — RENAL FUNCTION PANEL
Albumin: 1.6 g/dL — ABNORMAL LOW (ref 3.5–5.0)
Anion gap: 16 — ABNORMAL HIGH (ref 5–15)
BUN: 166 mg/dL — ABNORMAL HIGH (ref 6–20)
CHLORIDE: 92 mmol/L — AB (ref 101–111)
CO2: 26 mmol/L (ref 22–32)
CREATININE: 3.02 mg/dL — AB (ref 0.44–1.00)
Calcium: 7.4 mg/dL — ABNORMAL LOW (ref 8.9–10.3)
GFR calc Af Amer: 16 mL/min — ABNORMAL LOW (ref >60)
GFR calc non Af Amer: 14 mL/min — ABNORMAL LOW (ref >60)
GLUCOSE: 231 mg/dL — AB (ref 65–99)
POTASSIUM: 4.5 mmol/L (ref 3.5–5.1)
Phosphorus: 6.3 mg/dL — ABNORMAL HIGH (ref 2.5–4.6)
Sodium: 134 mmol/L — ABNORMAL LOW (ref 135–145)

## 2017-06-21 LAB — URINE CULTURE

## 2017-06-21 LAB — MAGNESIUM: Magnesium: 2.4 mg/dL (ref 1.7–2.4)

## 2017-06-21 LAB — C DIFFICILE QUICK SCREEN W PCR REFLEX
C DIFFICILE (CDIFF) INTERP: NOT DETECTED
C DIFFICILE (CDIFF) TOXIN: NEGATIVE
C DIFFICLE (CDIFF) ANTIGEN: NEGATIVE

## 2017-06-21 LAB — CBC
HCT: 40 % (ref 36.0–46.0)
Hemoglobin: 13 g/dL (ref 12.0–15.0)
MCH: 28.7 pg (ref 26.0–34.0)
MCHC: 32.5 g/dL (ref 30.0–36.0)
MCV: 88.3 fL (ref 78.0–100.0)
PLATELETS: 166 10*3/uL (ref 150–400)
RBC: 4.53 MIL/uL (ref 3.87–5.11)
RDW: 15.5 % (ref 11.5–15.5)
WBC: 40.6 10*3/uL — AB (ref 4.0–10.5)

## 2017-06-21 LAB — LACTIC ACID, PLASMA: LACTIC ACID, VENOUS: 2.3 mmol/L — AB (ref 0.5–1.9)

## 2017-06-21 LAB — POTASSIUM: Potassium: 4.8 mmol/L (ref 3.5–5.1)

## 2017-06-21 NOTE — Telephone Encounter (Signed)
Received call from team health nurse.  She connected me to the patient's daughter.  Patient's daughter is quite concerned about the [facility] that her [facility].  She feels as though she is not getting appropriate care and has possibly declined.  She reports that she discussed this yesterday at a care meeting though it does not seem as though anything is being completed.  She wanted to know if anybody from her mother's primary care doctor's office could come to the hospital and see the patient and make sure things are being done appropriately.  I discussed with the patient's daughter that unfortunately I do not have access to the records from the long-term care facility at this time.  I also discussed that we as [facility] physicians do not have hospital privileges and would be unable to see the patient in the hospital.  I encouraged her to discuss again with the physicians at [facility] the care that her mother is getting and if she is not happy with the care and the response that she gets she could try patient experience or taking it up the chain to have her mother reevaluated at [facility].  She voiced understanding. FYI to PCP.

## 2017-06-22 LAB — LACTIC ACID, PLASMA: LACTIC ACID, VENOUS: 1.5 mmol/L (ref 0.5–1.9)

## 2017-06-22 LAB — CBC
HCT: 33.9 % — ABNORMAL LOW (ref 36.0–46.0)
Hemoglobin: 11.1 g/dL — ABNORMAL LOW (ref 12.0–15.0)
MCH: 28.9 pg (ref 26.0–34.0)
MCHC: 32.7 g/dL (ref 30.0–36.0)
MCV: 88.3 fL (ref 78.0–100.0)
PLATELETS: 127 10*3/uL — AB (ref 150–400)
RBC: 3.84 MIL/uL — ABNORMAL LOW (ref 3.87–5.11)
RDW: 15.5 % (ref 11.5–15.5)
WBC: 30.2 10*3/uL — ABNORMAL HIGH (ref 4.0–10.5)

## 2017-06-22 LAB — RENAL FUNCTION PANEL
Albumin: 1.4 g/dL — ABNORMAL LOW (ref 3.5–5.0)
Anion gap: 12 (ref 5–15)
BUN: 155 mg/dL — AB (ref 6–20)
CALCIUM: 7.3 mg/dL — AB (ref 8.9–10.3)
CO2: 23 mmol/L (ref 22–32)
CREATININE: 2.55 mg/dL — AB (ref 0.44–1.00)
Chloride: 103 mmol/L (ref 101–111)
GFR calc Af Amer: 20 mL/min — ABNORMAL LOW (ref >60)
GFR calc non Af Amer: 17 mL/min — ABNORMAL LOW (ref >60)
GLUCOSE: 177 mg/dL — AB (ref 65–99)
PHOSPHORUS: 5.9 mg/dL — AB (ref 2.5–4.6)
Potassium: 4.3 mmol/L (ref 3.5–5.1)
SODIUM: 138 mmol/L (ref 135–145)

## 2017-06-22 LAB — MAGNESIUM: Magnesium: 2.3 mg/dL (ref 1.7–2.4)

## 2017-06-22 LAB — PHOSPHORUS: Phosphorus: 5.7 mg/dL — ABNORMAL HIGH (ref 2.5–4.6)

## 2017-06-23 LAB — CBC
HEMATOCRIT: 32.5 % — AB (ref 36.0–46.0)
HEMOGLOBIN: 10.2 g/dL — AB (ref 12.0–15.0)
MCH: 27.9 pg (ref 26.0–34.0)
MCHC: 31.4 g/dL (ref 30.0–36.0)
MCV: 89 fL (ref 78.0–100.0)
Platelets: 128 10*3/uL — ABNORMAL LOW (ref 150–400)
RBC: 3.65 MIL/uL — AB (ref 3.87–5.11)
RDW: 15.7 % — ABNORMAL HIGH (ref 11.5–15.5)
WBC: 22.8 10*3/uL — ABNORMAL HIGH (ref 4.0–10.5)

## 2017-06-23 LAB — RENAL FUNCTION PANEL
ALBUMIN: 1.3 g/dL — AB (ref 3.5–5.0)
ANION GAP: 9 (ref 5–15)
BUN: 131 mg/dL — ABNORMAL HIGH (ref 6–20)
CO2: 24 mmol/L (ref 22–32)
Calcium: 7.2 mg/dL — ABNORMAL LOW (ref 8.9–10.3)
Chloride: 110 mmol/L (ref 101–111)
Creatinine, Ser: 1.92 mg/dL — ABNORMAL HIGH (ref 0.44–1.00)
GFR, EST AFRICAN AMERICAN: 28 mL/min — AB (ref >60)
GFR, EST NON AFRICAN AMERICAN: 24 mL/min — AB (ref >60)
GLUCOSE: 188 mg/dL — AB (ref 65–99)
PHOSPHORUS: 3.7 mg/dL (ref 2.5–4.6)
POTASSIUM: 3.7 mmol/L (ref 3.5–5.1)
Sodium: 143 mmol/L (ref 135–145)

## 2017-06-23 LAB — MAGNESIUM: MAGNESIUM: 2.2 mg/dL (ref 1.7–2.4)

## 2017-06-23 LAB — LACTIC ACID, PLASMA: LACTIC ACID, VENOUS: 1.5 mmol/L (ref 0.5–1.9)

## 2017-06-25 LAB — BASIC METABOLIC PANEL
Anion gap: 8 (ref 5–15)
BUN: 92 mg/dL — AB (ref 6–20)
CHLORIDE: 121 mmol/L — AB (ref 101–111)
CO2: 20 mmol/L — AB (ref 22–32)
CREATININE: 1.31 mg/dL — AB (ref 0.44–1.00)
Calcium: 7.6 mg/dL — ABNORMAL LOW (ref 8.9–10.3)
GFR calc non Af Amer: 38 mL/min — ABNORMAL LOW (ref >60)
GFR, EST AFRICAN AMERICAN: 44 mL/min — AB (ref >60)
Glucose, Bld: 141 mg/dL — ABNORMAL HIGH (ref 65–99)
Potassium: 3.8 mmol/L (ref 3.5–5.1)
Sodium: 149 mmol/L — ABNORMAL HIGH (ref 135–145)

## 2017-06-25 LAB — CBC
HEMATOCRIT: 31.2 % — AB (ref 36.0–46.0)
HEMOGLOBIN: 9.8 g/dL — AB (ref 12.0–15.0)
MCH: 28.2 pg (ref 26.0–34.0)
MCHC: 31.4 g/dL (ref 30.0–36.0)
MCV: 89.9 fL (ref 78.0–100.0)
Platelets: 101 10*3/uL — ABNORMAL LOW (ref 150–400)
RBC: 3.47 MIL/uL — ABNORMAL LOW (ref 3.87–5.11)
RDW: 16 % — ABNORMAL HIGH (ref 11.5–15.5)
WBC: 16.6 10*3/uL — ABNORMAL HIGH (ref 4.0–10.5)

## 2017-06-25 LAB — CULTURE, BLOOD (ROUTINE X 2)
CULTURE: NO GROWTH
Culture: NO GROWTH
SPECIAL REQUESTS: ADEQUATE
Special Requests: ADEQUATE

## 2017-06-25 LAB — CK TOTAL AND CKMB (NOT AT ARMC)
CK TOTAL: 346 U/L — AB (ref 38–234)
CK, MB: 14.3 ng/mL — ABNORMAL HIGH (ref 0.5–5.0)
CK, MB: 14.4 ng/mL — AB (ref 0.5–5.0)
Relative Index: 3.8 — ABNORMAL HIGH (ref 0.0–2.5)
Relative Index: 4.2 — ABNORMAL HIGH (ref 0.0–2.5)
Total CK: 378 U/L — ABNORMAL HIGH (ref 38–234)

## 2017-06-25 LAB — TROPONIN I
Troponin I: 0.07 ng/mL (ref <0.03)
Troponin I: 0.09 ng/mL (ref <0.03)

## 2017-06-25 LAB — MAGNESIUM: Magnesium: 1.9 mg/dL (ref 1.7–2.4)

## 2017-06-26 ENCOUNTER — Encounter: Payer: Self-pay | Admitting: Physician Assistant

## 2017-06-26 DIAGNOSIS — I4891 Unspecified atrial fibrillation: Secondary | ICD-10-CM

## 2017-06-26 DIAGNOSIS — D62 Acute posthemorrhagic anemia: Secondary | ICD-10-CM

## 2017-06-26 DIAGNOSIS — N179 Acute kidney failure, unspecified: Secondary | ICD-10-CM | POA: Insufficient documentation

## 2017-06-26 DIAGNOSIS — E46 Unspecified protein-calorie malnutrition: Secondary | ICD-10-CM

## 2017-06-26 DIAGNOSIS — E8809 Other disorders of plasma-protein metabolism, not elsewhere classified: Secondary | ICD-10-CM

## 2017-06-26 DIAGNOSIS — N189 Chronic kidney disease, unspecified: Secondary | ICD-10-CM

## 2017-06-26 DIAGNOSIS — K922 Gastrointestinal hemorrhage, unspecified: Secondary | ICD-10-CM

## 2017-06-26 LAB — BASIC METABOLIC PANEL
Anion gap: 9 (ref 5–15)
BUN: 73 mg/dL — AB (ref 6–20)
CO2: 20 mmol/L — ABNORMAL LOW (ref 22–32)
Calcium: 7.5 mg/dL — ABNORMAL LOW (ref 8.9–10.3)
Chloride: 117 mmol/L — ABNORMAL HIGH (ref 101–111)
Creatinine, Ser: 1.11 mg/dL — ABNORMAL HIGH (ref 0.44–1.00)
GFR calc Af Amer: 54 mL/min — ABNORMAL LOW (ref >60)
GFR calc non Af Amer: 47 mL/min — ABNORMAL LOW (ref >60)
GLUCOSE: 125 mg/dL — AB (ref 65–99)
POTASSIUM: 3.8 mmol/L (ref 3.5–5.1)
SODIUM: 146 mmol/L — AB (ref 135–145)

## 2017-06-26 LAB — CBC
HCT: 29.9 % — ABNORMAL LOW (ref 36.0–46.0)
HEMOGLOBIN: 9.5 g/dL — AB (ref 12.0–15.0)
MCH: 28.4 pg (ref 26.0–34.0)
MCHC: 31.8 g/dL (ref 30.0–36.0)
MCV: 89.3 fL (ref 78.0–100.0)
Platelets: 91 10*3/uL — ABNORMAL LOW (ref 150–400)
RBC: 3.35 MIL/uL — ABNORMAL LOW (ref 3.87–5.11)
RDW: 16 % — AB (ref 11.5–15.5)
WBC: 13.4 10*3/uL — AB (ref 4.0–10.5)

## 2017-06-26 LAB — T4, FREE: Free T4: 0.74 ng/dL (ref 0.61–1.12)

## 2017-06-26 LAB — TROPONIN I: Troponin I: 0.36 ng/mL (ref <0.03)

## 2017-06-26 LAB — TSH: TSH: 0.5 u[IU]/mL (ref 0.350–4.500)

## 2017-06-26 NOTE — Consult Note (Addendum)
Cardiology Consultation:   Patient ID: Becky Forbes; [id number]; 25-May-1940   Admit date: 06/13/2017 Date of Consult: 06/26/2017  Primary Care Provider: Binnie Rail, MD Primary Cardiologist: Dr. Acie Forbes Primary Electrophysiologist:  Dr. Rayann Forbes  Chief Complaint: fatigue  Patient Profile:   Becky Forbes is a 77 y.o. female with a hx of persistent atrial fib and atrial flutter (not on anticoagulation secondary to GI bleed), previously presumed NICM (varying EF over the years with improvement then recent decrease, no prior ischemic w/u), 3V coronary calcification on CT 05/2017, SSS s/p PPM 2015, DMII, HTN, HLD, CKD stage III (baseline -1.3), anemia, mild carotid disease by duplex 07/2016 who is being seen today for the evaluation of elevated troponin at the request of Becky Norlander, NP.  History of Present Illness:   She has a long history of atrial fib going back many years. Per notes, in 2014 she was admitted with acute GIB at which time she was found to have EF drop to 35-40% and recurrent atrial fib. She was not felt to be a candidate for anticoagulation due to the GIB. Dr. Elmarie Forbes notes at that time presumed her cardiomyopathy to be non-ischemic. She has not had a prior ischemic workup. She was started on amiodarone in 2015 but has had issues with it causing fatigue intermittently. 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 device.  She was recently admitted with worsening shortness of breath and cough. Per family, she had noticed this starting around October with some fatigue. [name] had this for decades so it was hard to determine how acute it was. She saw Dr. Acie Forbes around that time and amiodarone was decreased to MWF and metoprolol was decreased to 25mg  BID. Per daughter, the patient "worked her butt off" in [place] and did relatively OK. They took a lovely family photo around  Valentine's Day which is at bedside. Unfortunately her symptoms progressed and she was admitted for acute respiratory failure with hypoxia from 2/18-06/13/17 which was ultimately suspected to be amio vs bromocriptine toxicity. 2D Echo 06/03/17 showed EF 30-35%, diffuse HK, mild-mod MR, severely dilated LA/RA, mild TR, normal PASP. CT of the chest showed patchy consolidation of the lungs and ground glass/thickening as well as nonspecific mediastinal and bilateral hilar adenopathy, likely reactive, as well as 3v coronary atherosclerosis. The pulmonary findings were felt to reflect ARDS. PCCM was consulted. She underwent autoimmune work up which was interpreted as negative by medical team. She was maintained on BiPAP with slow wean to HFNC O2. Amiodarone and bromocriptine were stopped due to concern for acute lung injury. It is not clear to me why she was on bromocriptine from outpatient endocrine notes. She was started on IV steroids, Levaquin for possible PNA, and IV Lasix with diuresis. D-dimer was elevated - VQ scan was ordered but could not be done while on bipap. LE venous duplex was negative for DVT.  She was rate controlled by cardiology with recommendation to consider outpatient Lexiscan after recovery for newly reduced EF. She was newly started on Eliquis during that stay. She also had AKI superimposed on CKD III with renal US suggestive of medical renal disease.  Since discharge to [facility], she had transient interim worsening of renal failure with Cr peak of 3.02 and hypernatremia so Lasix was stopped and she was treated with free water. She also had profound leukocytosis of 40k and severe hypoalbuminemia of 1.3. Blood cultures  NGTD. Per discussion with [facility] NP, she was started on Flagyl due to diarrhea for empiric rx of C diff. Her C diff screen was negative but she clinically improved on Flagyl with decreasing WBC count (to 16k) so this has been continued. Yesterday she was noted to have increasing HR so  was given IV metoprolol pushes and oral metoprolol was titrated to 25mg  BID. She also had 7 beats reportedly of NSVT (unable to personally locate in telemetry due to extensive tachy alarms based on telemetry system setup), therefore troponins were obtained. She has not had any chest pain either this admission or recently. Initial labs showed CK 378, MB 14.3, troponin 0.07, CK 346/MB 14.4/troponin 0.09, then troponin 0.36. Most notably she has had progressive anemia/thrombocytopenia with most recent Hgb of 9.8 (was 14 last week) and plt 101 both obtained yesterday. Eliquis was held and GI consult was planned but not yet obtained. [facility] team has verified they are being consulted today and are preliminarily planning to scope the patient. FOBT is +. She is fortunately not having diarrhea anymore but had a melanotic stool this AM.   The patient denies any chest pain whatsoever. She has had mild increase WOB today per daughter but the patient herself denies any acute dyspnea. She has mild pale pitting edema of the lower extremities. [name] been afebrile the last several days. HR appears 105-115 with occasional spikes. Blood pressure is low normal.  Past Medical History:  Diagnosis Date  . Anemia    hx  . Arthritis   . Carotid artery disease ([place])    a. Carotid duplex in 2010: 18-29% RICA, 93-71% LICA.  Marland Kitchen Chronic combined systolic and diastolic CHF (congestive heart failure) ([place])   . CKD (chronic kidney disease), stage III ([place])    no nephrologist   . Complication of anesthesia   . Diabetes mellitus    x 10 yrs  . GI bleed    a. 04/2012 - lower GIB - felt 2/2 mild ischemic colitis as shown on colonoscopy, was cleared for anticoag 1 week out from procedure.  Marland Kitchen Heart murmur    hx  . HTN (hypertension)   . Hyperlipemia   . Hypothyroidism   . Ischemic colitis ([place])    a. 04/2012 - lower GIB - felt 2/2 mild ischemic colitis as shown on colonoscopy..  . PAF (paroxysmal atrial fibrillation) ([place])   .  PONV (postoperative nausea and vomiting)   . 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 [place] DR  dual-chamber pacemaker 03/2014 by Dr. Rayann Forbes for sick sinus/sx bradycardia  . Renal insufficiency     Past Surgical History:  Procedure Laterality Date  . ABDOMINAL HYSTERECTOMY    . APPENDECTOMY    . BACK SURGERY    . CARPAL TUNNEL RELEASE Right   . CATARACT EXTRACTION  05/25/2007   RIGHT EYE,left  . COLONOSCOPY  01/31/2009   small external/internal hemorrhoids, diverticulosis in sigmoid colon, one small polyp (biopsied). Biospy essentially normal. Dr.Magod  . COLONOSCOPY  04/30/2012   Procedure: COLONOSCOPY;  Surgeon: Cleotis Nipper, MD;  Location[facility] ENDOSCOPY;  Service: Endoscopy;  Laterality: N/A;  unprepped, poss flex only  . FOOT SURGERY Left 2006   MID FOOT RECONSTRUCTION  . GANGLION CYST EXCISION Right 12/2007   Dr.Gramig hand  . JOINT REPLACEMENT     bilateral  . LUMBAR LAMINECTOMY     x3 one fusion  . OSTEOTOMY Left 2006   AND FUSION  . PARS PLANA  VITRECTOMY W/ REPAIR OF MACULAR HOLE Right 2010  . PERMANENT PACEMAKER INSERTION N/A 03/29/2014   [employer]J [employer] dual chamber paceamker implnated by Dr Becky Forbes  . TOTAL KNEE ARTHROPLASTY Bilateral 05/02/08, 07/25/08   Dr.Alusio     Inpatient Medications: Metronidazole 500mg  IV q8hours Humalog Atorvastatin 20mg  qday Biotene Calcium/D3 supp Lantus 10 units BID Metoprolol 25mg  BID Levothyroxine 70mcg Nephrovite 1 tab daily Prednisone 40mg  daily Pro Stat 30 mL daily Protonix 40mg  IV BID Vit D 1000 units daily  Confirmed with NP that Eliquis has been stopped   Home Meds: Prior to Admission medications   Medication Sig Start Date End Date Taking? Authorizing Provider  acetaminophen (TYLENOL) 325 MG tablet Take 650 mg by mouth every 6 (six) hours as needed for moderate pain.    [provider]  apixaban (ELIQUIS) 5 MG TABS tablet Take 1 tablet (5 mg total) by mouth 2 (two) times  daily. 06/13/17   Dessa Phi, DO  atorvastatin (LIPITOR) 20 MG tablet TAKE 1 TABLET BY MOUTH  DAILY Patient taking differently: TAKE 20 mg TABLET BY MOUTH  DAILY 12/05/16   Nahser, Wonda Cheng, MD  cholecalciferol (VITAMIN D) 400 units TABS tablet Take 800 Units by mouth daily.    [provider]  furosemide (LASIX) 40 MG tablet Take 1 tablet (40 mg total) by mouth 2 (two) times daily. 06/13/17   Dessa Phi, DO  insulin glargine (LANTUS) 100 UNIT/ML injection Inject 0.3 mLs (30 Units total) into the skin daily. 06/14/17   Dessa Phi, DO  insulin lispro (HUMALOG) 100 UNIT/ML injection 4 units with meals PLUS meal-time sliding scale coverage 0-15 units with meals 06/13/17   Dessa Phi, DO  ipratropium (ATROVENT) 0.02 % nebulizer solution Take 2.5 mLs (0.5 mg total) by nebulization every 4 (four) hours as needed for wheezing or shortness of breath. 06/13/17   Dessa Phi, DO  levalbuterol (XOPENEX) 0.63 MG/3ML nebulizer solution Take 3 mLs (0.63 mg total) by nebulization every 4 (four) hours as needed for wheezing or shortness of breath. 06/13/17   Dessa Phi, DO  levothyroxine (SYNTHROID, LEVOTHROID) 88 MCG tablet Take 1 tablet (88 mcg total) daily by mouth. 02/25/17   Burns, Claudina Lick, MD  metoprolol tartrate (LOPRESSOR) 25 MG tablet Take 0.5 tablets (12.5 mg total) by mouth 2 (two) times daily. 06/13/17 07/13/17  Dessa Phi, DO  predniSONE (DELTASONE) 10 MG tablet Take 4 tablets (40 mg total) by mouth daily. Start once IV solumedrol taper finished 06/13/17   Dessa Phi, DO    Allergies:    Allergies  Allergen Reactions  . Morphine And Related Itching, Nausea And Vomiting, Rash and Other (See Comments)    01/04/15- patient says she is NOT allergic to morphine.  Morphine IV given this AM 01/04/15 in the ED. No adverse reaction.  Celesta Gentile [Sitagliptin Phosphate] Other (See Comments)    Pt says this caused elevated liver enzymes and creatinine. IS TAKING 1/2 TABLET  . Amiodarone      Possible pulmonary toxicity  . Bromocriptine     Possible contributor to pulmonary issues  . Actos [Pioglitazone Hydrochloride] Swelling    UNSPECIFIED REACTION   . Hydrocodone-Acetaminophen Itching, Nausea And Vomiting and Other (See Comments)    Per pt 05/12/15 she had this recently in the hospital with no issues at all  . Keflex [Cephalexin] Rash and Other (See Comments)    Burning sensation all over  . Percocet [Oxycodone-Acetaminophen] Nausea And Vomiting    Social History:   Social History  Socioeconomic History  . Marital status: Married    Spouse name: Idonia Zollinger  . Number of children: 2  . Years of education: Not on file  . Highest education level: Not on file  Social Needs  . Financial resource strain: Not on file  . Food insecurity - worry: Not on file  . Food insecurity - inability: Not on file  . Transportation needs - medical: Not on file  . Transportation needs - non-medical: Not on file  Occupational History  . Occupation: RETIRED RN    Employer: [employer]    Comment: RETIRED IN 2007  Tobacco Use  . Smoking status: Never Smoker  . Smokeless tobacco: Never Used  Substance and Sexual Activity  . Alcohol use: No  . Drug use: No  . Sexual activity: Not Currently  Other Topics Concern  . Not on file  Social History Narrative  . Not on file    Family History:   The patient's family history includes Coronary artery disease in her father; Diabetes in her brother, father, and sister; Hypertension in her father and mother; Kidney failure in her mother and sister; Other in her brother; Stroke in her father.  ROS:  Please see the history of present illness. No fever. All other ROS reviewed and negative.     Physical Exam/Data:  Temp 97.3, pulse 95, RR 16, Pulse ox 97% on O2, BP 110/57 General: Cachetic, frail, chronically ill appearing WF in no acute distress (looks strikingly different  much more debilitated than the bedside photo from earlier in  February) Head: Normocephalic, atraumatic, sclera non-icteric, no xanthomas, nares are without discharge.  Neck: Negative for carotid bruits. JVD not elevated. Lungs: Diminished coarse BS throughout. Breathing is unlabored. Heart: Irregularly irregular, mild tachycardia, with S1 S2. No murmurs, rubs, or gallops appreciated. Abdomen: Soft, non-tender, non-distended with normoactive bowel sounds. No hepatomegaly. No rebound/guarding. No obvious abdominal masses. Msk:  Strength and tone appear normal for age. Extremities: No clubbing or cyanosis. 1+ pale pitting LE edema bilaterally.   Neuro: Alert and oriented X 3 but somewhat slow to respond. No facial asymmetry. No focal deficit. Moves all extremities spontaneously. Psych:  Responds to questions appropriately with a normal affect.  EKG:  The EKG was personally reviewed and demonstrates atrial fib RVR 114bpm with subtle ST elevation in III, avF with inferior Q waves in these leads, TWI I, avL, otherwise nonspecific changes. The inferior Q's and subtle ST elevation are similar to tracing III, avF but new compared to 01/2017  Relevant CV Studies: Referenced above.  Laboratory Data:  Chemistry Recent Labs  Lab 06/23/17 0625 06/25/17 0652 06/26/17 1002  NA 143 149* 146*  K 3.7 3.8 3.8  CL 110 121* 117*  CO2 24 20* 20*  GLUCOSE 188* 141* 125*  BUN 131* 92* 73*  CREATININE 1.92* 1.31* 1.11*  CALCIUM 7.2* 7.6* 7.5*  GFRNONAA 24* 38* 47*  GFRAA 28* 44* 54*  ANIONGAP 9 8 9     Recent Labs  Lab 06/21/17 0641 06/22/17 0819 06/23/17 0625  ALBUMIN 1.6* 1.4* 1.3*   Hematology Recent Labs  Lab 06/22/17 0819 06/23/17 0625 06/25/17 0652  WBC 30.2* 22.8* 16.6*  RBC 3.84* 3.65* 3.47*  HGB 11.1* 10.2* 9.8*  HCT 33.9* 32.5* 31.2*  MCV 88.3 89.0 89.9  MCH 28.9 27.9 28.2  MCHC 32.7 31.4 31.4  RDW 15.5 15.7* 16.0*  PLT 127* 128* 101*   Cardiac Enzymes Recent Labs  Lab 06/25/17 1620 06/25/17 2140 06/26/17 1740  TROPONINI 0.07*  0.09* 0.36*   No results for input(s): TROPIPOC in the last 168 hours.  BNPNo results for input(s): BNP, PROBNP in the last 168 hours.  DDimer No results for input(s): DDIMER in the last 168 hours.  Radiology/Studies:  No results found.  Assessment and Plan:   1. Recent acute respiratory failure with hypoxia felt secondary to amiodarone vs bromocriptine - continues on O2 and steroids at this time.   2. Suspected recurrent GIB with melena and drop in Hgb from 14->9.8 - GI consulted. Likely driving HR. Eliquis stopped per discussion with medicine team NP. Would avoid anticoagulation in future times unless a completely reversible cause of her anemia has been determined with GI procedure. Even then, this can be revisited as outpatient contingent on how recovery goes. Per discussion with Dr. Percival Spanish, she is cleared from cardiac standpoint to proceed with GI procedure.  3. Profound leukocytosis out of proportion to steroid use, but improving with empiric Flagyl for diarrhea per primary team - per IM. Blood cultures fortunately negative (considered given her PPM). WBC improving, afebrile.  4. Persistent atrial fib/flutter with elevated rates likely secondary to the above - HR seems physiologically appropriate for the drop in hemoglobin. Given her low EF will attempt to improve rate control slightly to hopefully keep her from going into HF -> titrate metoprolol to TID (currently 25mg  BID). Her TSH was also recently suppressed so would repeat with free T4 and defer further management to primary team.  5. Elevated troponin/abnormal EKG - raises concern for interim inferior MI sometime since 01/2017. Troponins are relatively low and likely reflect demand ischemia but cannot exclude degree of underlying CAD. She has 3V cor calcification on CT but is not a candidate for invasive procedures given her ongoing probable GI bleed, acute blood loss anemia, severely debilitated state with poor nutritional status and  recent kidney failure. Unable to use aspirin given concern for GIB. We can revisit ischemic workup as an outpatient. Fortunately she does not have any chest pain. Consider titration of statin. Titrate beta blocker as discussed.  6. Chronic systolic CHF, unknown if ischemic - continue to hold Lasix given hypernatremia, but watch volume status. Would recommend daily weights and strict I/O's. Not a candidate for ACEI, ARB, spiro, Imdur, hydralazine presently due to soft blood pressures but can consider based on recovery. See above regarding ischemic workup. She has mild edema but I suspect this is due to her albumin.  7. Severe protein calorie malnutrition with severe hypoalbuminemia - nutrition per primary team.  For questions or updates, please contact [place] Please consult [email or web address] for contact info under Cardiology/STEMI.    Signed, Charlie Pitter, PA-C  06/26/2017 4:34 PM   History and all data above reviewed.  Patient examined.  I agree with the findings as above.  The patient has a very complex history.    She has chronic atrial fib as above.  She has had increased tachycardia and some runs of probable NSVT.  Apparently because of this troponin was drawn and is trending up as above.  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  She has IVCD.  She denies any acute symptoms.  She has no chest pain.   she has her chronic dyspnea that might be slightly worse. She has had a dark stool and is being evaluated for probable GI bleed. Probable endoscopy tomorrow.   The patient exam reveals XEN:[id number]  ,  Lungs: Diffuse  right greater than left crackles  ,  Abd: Positive bowel sounds, no rebound no guarding, Ext No edema  .  All available labs, radiology testing, previous records reviewed. Agree with documented assessment and plan.  Atrial fib:  We will increase the beta blocker slightly.  However, the rate is not out of proportion to what we might expect with  her illnesses and probable GI bleed.  She should not be put on anticoagulation again.  Elevated cardiac enzymes:  No acute ST changes she has had a reduced EF.  Demand ischemia vs NSTEMI.  She is not a candidate for intervention and is asymptomatic.  No change in therapy except increase in beta blocker.  NSVT:  Increase beta blocker.  Otherwise follow.      Jeneen Rinks Marisal Swarey  6:03 PM  06/26/2017

## 2017-06-26 NOTE — Progress Notes (Signed)
Consult note printed after MD signed and tubed up to 5E (had made secretary aware prior to tubing to expect it). Dayna Dunn PA-C

## 2017-06-27 ENCOUNTER — Encounter (HOSPITAL_COMMUNITY): Payer: Self-pay | Admitting: Anesthesiology

## 2017-06-27 ENCOUNTER — Encounter (HOSPITAL_COMMUNITY)
Admission: RE | Disposition: A | Discharge status: Other Acute Inpatient Hospital | Payer: Self-pay | Source: Ambulatory Visit | Attending: Internal Medicine

## 2017-06-27 ENCOUNTER — Inpatient Hospital Stay
Admission: AD | Admit: 2017-06-27 | Discharge: 2017-07-24 | Disposition: A | Discharge status: Skilled Nursing Facility | Payer: Self-pay | Source: Ambulatory Visit | Attending: Internal Medicine | Admitting: Internal Medicine

## 2017-06-27 DIAGNOSIS — I482 Chronic atrial fibrillation, unspecified: Secondary | ICD-10-CM | POA: Diagnosis present

## 2017-06-27 DIAGNOSIS — D649 Anemia, unspecified: Secondary | ICD-10-CM | POA: Diagnosis not present

## 2017-06-27 DIAGNOSIS — Z789 Other specified health status: Secondary | ICD-10-CM

## 2017-06-27 DIAGNOSIS — K921 Melena: Secondary | ICD-10-CM | POA: Diagnosis not present

## 2017-06-27 DIAGNOSIS — N183 Chronic kidney disease, stage 3 unspecified: Secondary | ICD-10-CM | POA: Diagnosis present

## 2017-06-27 DIAGNOSIS — Z7901 Long term (current) use of anticoagulants: Secondary | ICD-10-CM | POA: Diagnosis not present

## 2017-06-27 DIAGNOSIS — R0603 Acute respiratory distress: Secondary | ICD-10-CM

## 2017-06-27 DIAGNOSIS — I5022 Chronic systolic (congestive) heart failure: Secondary | ICD-10-CM | POA: Diagnosis present

## 2017-06-27 DIAGNOSIS — J9621 Acute and chronic respiratory failure with hypoxia: Secondary | ICD-10-CM | POA: Diagnosis present

## 2017-06-27 DIAGNOSIS — J9690 Respiratory failure, unspecified, unspecified whether with hypoxia or hypercapnia: Secondary | ICD-10-CM

## 2017-06-27 HISTORY — DX: Chronic systolic (congestive) heart failure: I50.22

## 2017-06-27 HISTORY — DX: Chronic kidney disease, stage 3 unspecified: N18.30

## 2017-06-27 HISTORY — DX: Sepsis, unspecified organism: A41.9

## 2017-06-27 HISTORY — DX: Chronic kidney disease, stage 3 (moderate): N18.3

## 2017-06-27 HISTORY — DX: Acute and chronic respiratory failure with hypoxia: J96.21

## 2017-06-27 HISTORY — PX: ESOPHAGOGASTRODUODENOSCOPY (EGD) WITH PROPOFOL: SHX5813

## 2017-06-27 HISTORY — DX: Chronic atrial fibrillation, unspecified: I48.20

## 2017-06-27 LAB — BASIC METABOLIC PANEL
Anion gap: 9 (ref 5–15)
BUN: 60 mg/dL — ABNORMAL HIGH (ref 6–20)
CALCIUM: 7.5 mg/dL — AB (ref 8.9–10.3)
CHLORIDE: 115 mmol/L — AB (ref 101–111)
CO2: 21 mmol/L — AB (ref 22–32)
Creatinine, Ser: 0.99 mg/dL (ref 0.44–1.00)
GFR calc non Af Amer: 54 mL/min — ABNORMAL LOW (ref >60)
Glucose, Bld: 90 mg/dL (ref 65–99)
Potassium: 4.1 mmol/L (ref 3.5–5.1)
Sodium: 145 mmol/L (ref 135–145)

## 2017-06-27 LAB — CK TOTAL AND CKMB (NOT AT ARMC)
CK, MB: 19.9 ng/mL — ABNORMAL HIGH (ref 0.5–5.0)
CK, MB: 18 ng/mL — ABNORMAL HIGH (ref 0.5–5.0)
Relative Index: 7.5 — ABNORMAL HIGH (ref 0.0–2.5)
Relative Index: 7.8 — ABNORMAL HIGH (ref 0.0–2.5)
Total CK: 265 U/L — ABNORMAL HIGH (ref 38–234)
Total CK: 231 U/L (ref 38–234)

## 2017-06-27 LAB — HEMOGLOBIN AND HEMATOCRIT, BLOOD
HEMATOCRIT: 28.8 % — AB (ref 36.0–46.0)
Hemoglobin: 8.9 g/dL — ABNORMAL LOW (ref 12.0–15.0)

## 2017-06-27 LAB — CBC
HEMATOCRIT: 29 % — AB (ref 36.0–46.0)
Hemoglobin: 9.3 g/dL — ABNORMAL LOW (ref 12.0–15.0)
MCH: 28.7 pg (ref 26.0–34.0)
MCHC: 32.1 g/dL (ref 30.0–36.0)
MCV: 89.5 fL (ref 78.0–100.0)
Platelets: 87 10*3/uL — ABNORMAL LOW (ref 150–400)
RBC: 3.24 MIL/uL — ABNORMAL LOW (ref 3.87–5.11)
RDW: 16.2 % — AB (ref 11.5–15.5)
WBC: 14.8 10*3/uL — ABNORMAL HIGH (ref 4.0–10.5)

## 2017-06-27 LAB — TROPONIN I
Troponin I: 0.9 ng/mL (ref <0.03)
Troponin I: 0.72 ng/mL (ref <0.03)

## 2017-06-27 SURGERY — ESOPHAGOGASTRODUODENOSCOPY (EGD) WITH PROPOFOL
Anesthesia: Monitor Anesthesia Care | Laterality: Left

## 2017-06-27 MED ORDER — EPINEPHRINE PF 1 MG/10ML IJ SOSY
PREFILLED_SYRINGE | INTRAMUSCULAR | Status: AC
  Filled 2017-06-27: qty 10

## 2017-06-27 MED ORDER — LIDOCAINE 2% (20 MG/ML) 5 ML SYRINGE
INTRAMUSCULAR | Status: DC | PRN
  Administered 2017-06-27: 20 mg via INTRAVENOUS

## 2017-06-27 MED ORDER — SODIUM CHLORIDE 0.9 % IJ SOLN
PREFILLED_SYRINGE | INTRAMUSCULAR | Status: DC | PRN
  Administered 2017-06-27: 3.5 mL

## 2017-06-27 MED ORDER — SODIUM CHLORIDE 0.9 % IV SOLN
INTRAVENOUS | Status: DC | PRN
  Administered 2017-06-27: 14:00:00 via INTRAVENOUS

## 2017-06-27 MED ORDER — PHENYLEPHRINE 40 MCG/ML (10ML) SYRINGE FOR IV PUSH (FOR BLOOD PRESSURE SUPPORT)
PREFILLED_SYRINGE | INTRAVENOUS | Status: DC | PRN
  Administered 2017-06-27: 80 ug via INTRAVENOUS
  Administered 2017-06-27 (×2): 40 ug via INTRAVENOUS

## 2017-06-27 MED ORDER — PROPOFOL 10 MG/ML IV BOLUS
INTRAVENOUS | Status: DC | PRN
  Administered 2017-06-27 (×3): 10 mg via INTRAVENOUS
  Administered 2017-06-27: 20 mg via INTRAVENOUS

## 2017-06-27 SURGICAL SUPPLY — 15 items

## 2017-06-27 NOTE — Consult Note (Signed)
[facility] Gastroenterology Consultation Note  Referring Provider: No ref. provider found Primary Care Physician:  Binnie Rail, MD Primary Gastroenterologist:  Dr.  Luiz Iron for Consultation:  melena  HPI: Becky Forbes is a 77 y.o. 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  She has history of atrial fibrillation and multiple other cardiac and non-cardiac comorbidities, recently started on apixaban after her hospitalization in February.  We were asked to see patient for drift in hemoglobin, hemoccult positive, black stools.  Apixaban on hold for a couple days.  Is on metronidazole empirically for C. Diff, but PCR negative.  Was on warfarin in the past, stopped due to GI bleeding.   Colonoscopy 2010 diverticulosis otherwise unrevealing, poorly prepped sigmoidoscopy 2014 unrevealing.   Past Medical History:  Diagnosis Date  . Acute hypoxemic respiratory failure ([place])    a. 05/2017 - ? ARDS from bromocriptine vs amiodarone -> discharged to [facility].  . Anemia    hx  . Arthritis   . Carotid artery disease ([place])    a. Carotid duplex 07/2016: 1-39% bilaterally, f/u PRN recommended.  . Chronic systolic CHF (congestive heart failure) ([place])    a. presumed NICM with varying EF over the years. No prior ischemic workup.  . CKD (chronic kidney disease), stage III ([place])    no nephrologist   . Complication of anesthesia   . Coronary artery calcification seen on CT scan   . Diabetes mellitus    x 10 yrs  . GI bleed    a. 04/2012 - lower GIB - felt 2/2 mild ischemic colitis as shown on colonoscopy, was cleared for anticoag 1 week out from procedure.  Marland Kitchen Heart murmur    hx  . HTN (hypertension)   . Hyperlipemia   . Hypothyroidism   . Ischemic colitis ([place])    a. 04/2012 - lower GIB - felt 2/2 mild ischemic colitis as shown on colonoscopy..  . Persistent atrial fibrillation ([place])   . PONV (postoperative nausea and vomiting)    . 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 [place] DR  dual-chamber pacemaker 03/2014 by Dr. Rayann Heman for sick sinus/sx bradycardia  . Renal insufficiency     Past Surgical History:  Procedure Laterality Date  . ABDOMINAL HYSTERECTOMY    . APPENDECTOMY    . BACK SURGERY    . CARPAL TUNNEL RELEASE Right   . CATARACT EXTRACTION  05/25/2007   RIGHT EYE,left  . COLONOSCOPY  01/31/2009   small external/internal hemorrhoids, diverticulosis in sigmoid colon, one small polyp (biopsied). Biospy essentially normal. Dr.Magod  . COLONOSCOPY  04/30/2012   Procedure: COLONOSCOPY;  Surgeon: Cleotis Nipper, MD;  Location: [facility] ENDOSCOPY;  Service: Endoscopy;  Laterality: N/A;  unprepped, poss flex only  . FOOT SURGERY Left 2006   MID FOOT RECONSTRUCTION  . GANGLION CYST EXCISION Right 12/2007   Dr.Gramig hand  . JOINT REPLACEMENT     bilateral  . LUMBAR LAMINECTOMY     x3 one fusion  . OSTEOTOMY Left 2006   AND FUSION  . PARS PLANA VITRECTOMY W/ REPAIR OF MACULAR HOLE Right 2010  . PERMANENT PACEMAKER INSERTION N/A 03/29/2014   STJ Assurity dual chamber paceamker implnated by Dr Rayann Heman  . TOTAL KNEE ARTHROPLASTY Bilateral 05/02/08, 07/25/08   Dr.Alusio    Prior to Admission medications   Medication Sig Start Date End Date Taking? Authorizing Provider  acetaminophen (TYLENOL) 325 MG tablet Take  650 mg by mouth every 6 (six) hours as needed for moderate pain.    [provider]  apixaban (ELIQUIS) 5 MG TABS tablet Take 1 tablet (5 mg total) by mouth 2 (two) times daily. 06/13/17   Dessa Phi, DO  atorvastatin (LIPITOR) 20 MG tablet TAKE 1 TABLET BY MOUTH  DAILY Patient taking differently: TAKE 20 mg TABLET BY MOUTH  DAILY 12/05/16   Nahser, Wonda Cheng, MD  cholecalciferol (VITAMIN D) 400 units TABS tablet Take 800 Units by mouth daily.    [provider]  furosemide (LASIX) 40 MG tablet Take 1 tablet (40 mg total) by mouth 2 (two) times daily. 06/13/17    Dessa Phi, DO  insulin glargine (LANTUS) 100 UNIT/ML injection Inject 0.3 mLs (30 Units total) into the skin daily. 06/14/17   Dessa Phi, DO  insulin lispro (HUMALOG) 100 UNIT/ML injection 4 units with meals PLUS meal-time sliding scale coverage 0-15 units with meals 06/13/17   Dessa Phi, DO  ipratropium (ATROVENT) 0.02 % nebulizer solution Take 2.5 mLs (0.5 mg total) by nebulization every 4 (four) hours as needed for wheezing or shortness of breath. 06/13/17   Dessa Phi, DO  levalbuterol (XOPENEX) 0.63 MG/3ML nebulizer solution Take 3 mLs (0.63 mg total) by nebulization every 4 (four) hours as needed for wheezing or shortness of breath. 06/13/17   Dessa Phi, DO  levothyroxine (SYNTHROID, LEVOTHROID) 88 MCG tablet Take 1 tablet (88 mcg total) daily by mouth. 02/25/17   Burns, Claudina Lick, MD  metoprolol tartrate (LOPRESSOR) 25 MG tablet Take 0.5 tablets (12.5 mg total) by mouth 2 (two) times daily. 06/13/17 07/13/17  Dessa Phi, DO  predniSONE (DELTASONE) 10 MG tablet Take 4 tablets (40 mg total) by mouth daily. Start once IV solumedrol taper finished 06/13/17   Dessa Phi, DO    No current facility-administered medications for this encounter.     Allergies as of 06/13/2017 - Review Complete 06/02/2017  Allergen Reaction Noted  . Morphine and related Itching, Nausea And Vomiting, Rash, and Other (See Comments) 06/14/2010  . Januvia [sitagliptin phosphate] Other (See Comments) 04/26/2011  . Amiodarone  06/11/2017  . Bromocriptine  06/11/2017  . Actos [pioglitazone hydrochloride] Swelling 04/26/2011  . Hydrocodone-acetaminophen Itching, Nausea And Vomiting, and Other (See Comments)   . Keflex [cephalexin] Rash and Other (See Comments) 06/12/2015  . Percocet [oxycodone-acetaminophen] Nausea And Vomiting 08/09/2011    Family History  Problem Relation Age of Onset  . Coronary artery disease Father   . Hypertension Father   . Stroke Father   . Diabetes Father   . Kidney  failure Mother   . Hypertension Mother   . Other Brother        SEPSIS  . Diabetes Brother   . Diabetes Sister   . Kidney failure Sister     Social History   Socioeconomic History  . Marital status: Married    Spouse name: JAMES  . Number of children: 2  . Years of education: Not on file  . Highest education level: Not on file  Social Needs  . Financial resource strain: Not on file  . Food insecurity - worry: Not on file  . Food insecurity - inability: Not on file  . Transportation needs - medical: Not on file  . Transportation needs - non-medical: Not on file  Occupational History  . Occupation: RETIRED RN    Employer: [employer]    Comment: RETIRED IN 2007  Tobacco Use  . Smoking status: Never  Smoker  . Smokeless tobacco: Never Used  Substance and Sexual Activity  . Alcohol use: No  . Drug use: No  . Sexual activity: Not Currently  Other Topics Concern  . Not on file  Social History Narrative  . Not on file    Review of Systems: As per HPI, all others negative  Physical Exam: Vital signs in last 24 hours:     General:   Alert,  Cachectic and weak-appearing, NAD Head:  Normocephalic and atraumatic. Eyes:  Sclera clear, no icterus.   Conjunctiva pink. Ears:  Normal auditory acuity. Nose:  NGT in place and anchored, No deformity, discharge,  or lesions. Mouth:  No deformity or lesions.  Oropharynx dry with ulcerations and sores Neck:  Supple; no masses or thyromegaly. Lungs:  Dry crackles diffusely, worse both bases.   No wheezes, crackles, or rhonchi. No acute distress. Heart:  Regular rate and rhythm; no murmurs, clicks, rubs,  or gallops. Abdomen:  Soft, nontender and nondistended. No masses, hepatosplenomegaly or hernias noted. Normal bowel sounds, without guarding, and without rebound.     Msk:  Diffuse atrophy otherwise symmetrical without gross deformities. Normal posture. Pulses:  Normal pulses noted. Extremities:  Without clubbing or  edema. Neurologic:  Alert and  oriented x4;  grossly normal neurologically. Skin:  Scattered ecchymoses, otherwise intact without significant lesions or rashes.  Psych:  Alert and cooperative. Normal mood and affect.   Lab Results: Recent Labs    06/25/17 0652 06/26/17 1722  WBC 16.6* 13.4*  HGB 9.8* 9.5*  HCT 31.2* 29.9*  PLT 101* 91*   BMET Recent Labs    06/25/17 0652 06/26/17 1002  NA 149* 146*  K 3.8 3.8  CL 121* 117*  CO2 20* 20*  GLUCOSE 141* 125*  BUN 92* 73*  CREATININE 1.31* 1.11*  CALCIUM 7.6* 7.5*   LFT No results for input(s): PROT, ALBUMIN, AST, ALT, ALKPHOS, BILITOT, BILIDIR, IBILI in the last 72 hours. PT/INR No results for input(s): LABPROT, INR in the last 72 hours.  Studies/Results: No results found.  Impression:  1.  Melena. 2.  Acute blood loss anemia. 3.  Chronic anticoagulation, on hold. 4.  Amiodarone toxicity. 5.  Elevated troponin; cardiology has seen, conservative management advised, clearance given to [place] for EGD.  Plan:  1. PPI. 2. Serial CBCs. 3.  Endoscopy today; advised patient/daughter that NGT may inadvertently become dislodged and have to be replaced. 4.  Risks (bleeding, infection, bowel perforation that could require surgery, sedation-related changes in cardiopulmonary systems), benefits (identification and possible treatment of source of symptoms, exclusion of certain causes of symptoms), and alternatives (watchful waiting, radiographic imaging studies, empiric medical treatment) of upper endoscopy (EGD) were explained to patient/family in detail and patient wishes to proceed. 5.  [facility] will follow; next step in management pending endoscopy findings.   LOS: 0 days   Wendolyn Raso M  06/27/2017, 9:34 AM  Cell [phone] If no answer or after 5 PM call [phone]

## 2017-06-27 NOTE — Anesthesia Postprocedure Evaluation (Signed)
Anesthesia Post Note  Patient: Becky Forbes  Procedure(s) Performed: ESOPHAGOGASTRODUODENOSCOPY (EGD) WITH PROPOFOL (Left )     Patient location during evaluation: Endoscopy Anesthesia Type: MAC Level of consciousness: awake and patient cooperative Pain management: pain level controlled Vital Signs Assessment: post-procedure vital signs reviewed and stable Respiratory status: spontaneous breathing, nonlabored ventilation, respiratory function stable and patient connected to nasal cannula oxygen Cardiovascular status: stable and blood pressure returned to baseline Postop Assessment: no apparent nausea or vomiting Anesthetic complications: no    Last Vitals:  Vitals:   06/27/17 1404 06/27/17 1455  BP: (!) 104/59 (!) 102/50  Pulse: 99 99  Resp: 13 (!) 25  Temp: 36.4 C 36.5 C  SpO2: 100% 99%    Last Pain:  Vitals:   06/27/17 1455  TempSrc: Oral                 Aadith Raudenbush,JAMES TERRILL

## 2017-06-27 NOTE — Brief Op Note (Addendum)
06/13/2017 - 06/27/2017  2:59 PM  PATIENT:  Becky Forbes  77 y.o. female  PRE-OPERATIVE DIAGNOSIS:  melena ,anemia  POST-OPERATIVE DIAGNOSIS:  severe esophagitis, polyp with visable vessel s/p epi  PROCEDURE:  Procedure(s): ESOPHAGOGASTRODUODENOSCOPY (EGD) WITH PROPOFOL (Left)  SURGEON:  Surgeon(s) and Role:    * Cleve Paolillo, MD - Primary  Finding / recommendations --------------------------------------- - EGD showed severe LA grade D esophagitis - medium-size polyp on the laser curvature towards fundus with visible vessel and stigmata of recent bleeding, treated with 3.5 mL epinephrine.  - Recommend holding anticoagulation for additional 48 hours - Monitor H&H every 12 hours - Unfortunately her NG tube came out during the withdrawal of endoscope - okay to start full liquid diet and advance as tolerated. - GI will follow  Otis Brace MD, [place] 06/27/2017, 3:01 PM  Contact #  [phone]

## 2017-06-27 NOTE — Anesthesia Procedure Notes (Signed)
Procedure Name: MAC Date/Time: 06/27/2017 2:35 PM Performed by: Renato Shin, CRNA Pre-anesthesia Checklist: Patient identified, Emergency Drugs available, Suction available and Patient being monitored Patient Re-evaluated:Patient Re-evaluated prior to induction Oxygen Delivery Method: Circle system utilized Preoxygenation: Pre-oxygenation with 100% oxygen Induction Type: IV induction Placement Confirmation: positive ETCO2,  CO2 detector and breath sounds checked- equal and bilateral Dental Injury: Teeth and Oropharynx as per pre-operative assessment

## 2017-06-27 NOTE — Anesthesia Preprocedure Evaluation (Addendum)
Anesthesia Evaluation    Airway Mallampati: II  TM Distance: <3 FB Neck ROM: Full    Dental  (+) Edentulous Upper   Pulmonary shortness of breath and with exertion,  Lung disesase    + decreased breath sounds      Cardiovascular hypertension, + CAD, + Peripheral Vascular Disease and +CHF  + pacemaker + Valvular Problems/Murmurs  Rhythm:Irregular Rate:Tachycardia  Echo Low EF   Neuro/Psych    GI/Hepatic Gi bleed   Endo/Other  diabetes  Renal/GU Renal disease     Musculoskeletal   Abdominal   Peds  Hematology  (+) anemia ,   Anesthesia Other Findings   Reproductive/Obstetrics                            Anesthesia Physical Anesthesia Plan  ASA: IV  Anesthesia Plan: MAC   Post-op Pain Management:    Induction: Intravenous  PONV Risk Score and Plan:   Airway Management Planned: Natural Airway and Nasal Cannula  Additional Equipment:   Intra-op Plan:   Post-operative Plan:   Informed Consent: I have reviewed the patients History and Physical, chart, labs and discussed the procedure including the risks, benefits and alternatives for the proposed anesthesia with the patient or authorized representative who has indicated his/her understanding and acceptance.   Dental advisory given  Plan Discussed with: CRNA  Anesthesia Plan Comments:         Anesthesia Quick Evaluation

## 2017-06-27 NOTE — H&P (View-Only) (Signed)
[facility] Gastroenterology Consultation Note  Referring Provider: No ref. provider found Primary Care Physician:  Binnie Rail, MD Primary Gastroenterologist:  Dr.  Luiz Iron for Consultation:  melena  HPI: Becky Forbes is a 77 y.[facility]. 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  She has history of atrial fibrillation and multiple other cardiac and non-cardiac comorbidities, recently started on apixaban after her hospitalization in February.  We were asked to see patient for drift in hemoglobin, hemoccult positive, black stools.  Apixaban on hold for a couple days.  Is on metronidazole empirically for C. Diff, but PCR negative.  Was on warfarin in the past, stopped due to GI bleeding.   Colonoscopy 2010 diverticulosis otherwise unrevealing, poorly prepped sigmoidoscopy 2014 unrevealing.   Past Medical History:  Diagnosis Date  . Acute hypoxemic respiratory failure ([place])    a. 05/2017 - ? ARDS from bromocriptine vs amiodarone -> discharged to [facility].  . Anemia    hx  . Arthritis   . Carotid artery disease ([place])    a. Carotid duplex 07/2016: 1-39% bilaterally, f/u PRN recommended.  . Chronic systolic CHF (congestive heart failure) ([place])    a. presumed NICM with varying EF over the years. No prior ischemic workup.  . CKD (chronic kidney disease), stage III ([place])    no nephrologist   . Complication of anesthesia   . Coronary artery calcification seen on CT scan   . Diabetes mellitus    x 10 yrs  . GI bleed    a. 04/2012 - lower GIB - felt 2/2 mild ischemic colitis as shown on colonoscopy, was cleared for anticoag 1 week out from procedure.  Marland Kitchen Heart murmur    hx  . HTN (hypertension)   . Hyperlipemia   . Hypothyroidism   . Ischemic colitis ([place])    a. 04/2012 - lower GIB - felt 2/2 mild ischemic colitis as shown on colonoscopy..  . Persistent atrial fibrillation ([place])   . PONV (postoperative nausea and vomiting)    . 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 [place] DR  dual-chamber pacemaker 03/2014 by Dr. Rayann Heman for sick sinus/sx bradycardia  . Renal insufficiency     Past Surgical History:  Procedure Laterality Date  . ABDOMINAL HYSTERECTOMY    . APPENDECTOMY    . BACK SURGERY    . CARPAL TUNNEL RELEASE Right   . CATARACT EXTRACTION  05/25/2007   RIGHT EYE,left  . COLONOSCOPY  01/31/2009   small external/internal hemorrhoids, diverticulosis in sigmoid colon, one small polyp (biopsied). Biospy essentially normal. Dr.Magod  . COLONOSCOPY  04/30/2012   Procedure: COLONOSCOPY;  Surgeon: Cleotis Nipper, MD;  Location: [facility] ENDOSCOPY;  Service: Endoscopy;  Laterality: N/A;  unprepped, poss flex only  . FOOT SURGERY Left 2006   MID FOOT RECONSTRUCTION  . GANGLION CYST EXCISION Right 12/2007   Dr.Gramig hand  . JOINT REPLACEMENT     bilateral  . LUMBAR LAMINECTOMY     x3 one fusion  . OSTEOTOMY Left 2006   AND FUSION  . PARS PLANA VITRECTOMY W/ REPAIR OF MACULAR HOLE Right 2010  . PERMANENT PACEMAKER INSERTION N/A 03/29/2014   STJ Assurity dual chamber paceamker implnated by Dr Rayann Heman  . TOTAL KNEE ARTHROPLASTY Bilateral 05/02/08, 07/25/08   Dr.Alusio    Prior to Admission medications   Medication Sig Start Date End Date Taking? Authorizing Provider  acetaminophen (TYLENOL) 325 MG tablet Take  650 mg by mouth every 6 (six) hours as needed for moderate pain.    [provider]  apixaban (ELIQUIS) 5 MG TABS tablet Take 1 tablet (5 mg total) by mouth 2 (two) times daily. 06/13/17   Dessa Phi, DO  atorvastatin (LIPITOR) 20 MG tablet TAKE 1 TABLET BY MOUTH  DAILY Patient taking differently: TAKE 20 mg TABLET BY MOUTH  DAILY 12/05/16   Nahser, Wonda Cheng, MD  cholecalciferol (VITAMIN D) 400 units TABS tablet Take 800 Units by mouth daily.    [provider]  furosemide (LASIX) 40 MG tablet Take 1 tablet (40 mg total) by mouth 2 (two) times daily. 06/13/17    Dessa Phi, DO  insulin glargine (LANTUS) 100 UNIT/ML injection Inject 0.3 mLs (30 Units total) into the skin daily. 06/14/17   Dessa Phi, DO  insulin lispro (HUMALOG) 100 UNIT/ML injection 4 units with meals PLUS meal-time sliding scale coverage 0-15 units with meals 06/13/17   Dessa Phi, DO  ipratropium (ATROVENT) 0.02 % nebulizer solution Take 2.5 mLs (0.5 mg total) by nebulization every 4 (four) hours as needed for wheezing or shortness of breath. 06/13/17   Dessa Phi, DO  levalbuterol (XOPENEX) 0.63 MG/3ML nebulizer solution Take 3 mLs (0.63 mg total) by nebulization every 4 (four) hours as needed for wheezing or shortness of breath. 06/13/17   Dessa Phi, DO  levothyroxine (SYNTHROID, LEVOTHROID) 88 MCG tablet Take 1 tablet (88 mcg total) daily by mouth. 02/25/17   Burns, Claudina Lick, MD  metoprolol tartrate (LOPRESSOR) 25 MG tablet Take 0.5 tablets (12.5 mg total) by mouth 2 (two) times daily. 06/13/17 07/13/17  Dessa Phi, DO  predniSONE (DELTASONE) 10 MG tablet Take 4 tablets (40 mg total) by mouth daily. Start once IV solumedrol taper finished 06/13/17   Dessa Phi, DO    No current facility-administered medications for this encounter.     Allergies as of 06/13/2017 - Review Complete 06/02/2017  Allergen Reaction Noted  . Morphine and related Itching, Nausea And Vomiting, Rash, and Other (See Comments) 06/14/2010  . Januvia [sitagliptin phosphate] Other (See Comments) 04/26/2011  . Amiodarone  06/11/2017  . Bromocriptine  06/11/2017  . Actos [pioglitazone hydrochloride] Swelling 04/26/2011  . Hydrocodone-acetaminophen Itching, Nausea And Vomiting, and Other (See Comments)   . Keflex [cephalexin] Rash and Other (See Comments) 06/12/2015  . Percocet [oxycodone-acetaminophen] Nausea And Vomiting 08/09/2011    Family History  Problem Relation Age of Onset  . Coronary artery disease Father   . Hypertension Father   . Stroke Father   . Diabetes Father   . Kidney  failure Mother   . Hypertension Mother   . Other Brother        SEPSIS  . Diabetes Brother   . Diabetes Sister   . Kidney failure Sister     Social History   Socioeconomic History  . Marital status: Married    Spouse name: JAMES  . Number of children: 2  . Years of education: Not on file  . Highest education level: Not on file  Social Needs  . Financial resource strain: Not on file  . Food insecurity - worry: Not on file  . Food insecurity - inability: Not on file  . Transportation needs - medical: Not on file  . Transportation needs - non-medical: Not on file  Occupational History  . Occupation: RETIRED RN    Employer: [employer]    Comment: RETIRED IN 2007  Tobacco Use  . Smoking status: Never  Smoker  . Smokeless tobacco: Never Used  Substance and Sexual Activity  . Alcohol use: No  . Drug use: No  . Sexual activity: Not Currently  Other Topics Concern  . Not on file  Social History Narrative  . Not on file    Review of Systems: As per HPI, all others negative  Physical Exam: Vital signs in last 24 hours:     General:   Alert,  Cachectic and weak-appearing, NAD Head:  Normocephalic and atraumatic. Eyes:  Sclera clear, no icterus.   Conjunctiva pink. Ears:  Normal auditory acuity. Nose:  NGT in place and anchored, No deformity, discharge,  or lesions. Mouth:  No deformity or lesions.  Oropharynx dry with ulcerations and sores Neck:  Supple; no masses or thyromegaly. Lungs:  Dry crackles diffusely, worse both bases.   No wheezes, crackles, or rhonchi. No acute distress. Heart:  Regular rate and rhythm; no murmurs, clicks, rubs,  or gallops. Abdomen:  Soft, nontender and nondistended. No masses, hepatosplenomegaly or hernias noted. Normal bowel sounds, without guarding, and without rebound.     Msk:  Diffuse atrophy otherwise symmetrical without gross deformities. Normal posture. Pulses:  Normal pulses noted. Extremities:  Without clubbing or  edema. Neurologic:  Alert and  oriented x4;  grossly normal neurologically. Skin:  Scattered ecchymoses, otherwise intact without significant lesions or rashes.  Psych:  Alert and cooperative. Normal mood and affect.   Lab Results: Recent Labs    06/25/17 0652 06/26/17 1722  WBC 16.6* 13.4*  HGB 9.8* 9.5*  HCT 31.2* 29.9*  PLT 101* 91*   BMET Recent Labs    06/25/17 0652 06/26/17 1002  NA 149* 146*  K 3.8 3.8  CL 121* 117*  CO2 20* 20*  GLUCOSE 141* 125*  BUN 92* 73*  CREATININE 1.31* 1.11*  CALCIUM 7.6* 7.5*   LFT No results for input(s): PROT, ALBUMIN, AST, ALT, ALKPHOS, BILITOT, BILIDIR, IBILI in the last 72 hours. PT/INR No results for input(s): LABPROT, INR in the last 72 hours.  Studies/Results: No results found.  Impression:  1.  Melena. 2.  Acute blood loss anemia. 3.  Chronic anticoagulation, on hold. 4.  Amiodarone toxicity. 5.  Elevated troponin; cardiology has seen, conservative management advised, clearance given to [place] for EGD.  Plan:  1. PPI. 2. Serial CBCs. 3.  Endoscopy today; advised patient/daughter that NGT may inadvertently become dislodged and have to be replaced. 4.  Risks (bleeding, infection, bowel perforation that could require surgery, sedation-related changes in cardiopulmonary systems), benefits (identification and possible treatment of source of symptoms, exclusion of certain causes of symptoms), and alternatives (watchful waiting, radiographic imaging studies, empiric medical treatment) of upper endoscopy (EGD) were explained to patient/family in detail and patient wishes to proceed. 5.  [facility] will follow; next step in management pending endoscopy findings.   LOS: 0 days   Jaryah Aracena M  06/27/2017, 9:34 AM  Cell [phone] If no answer or after 5 PM call [phone]

## 2017-06-27 NOTE — Interval H&P Note (Signed)
History and Physical Interval Note:  06/27/2017 2:12 PM  Becky Forbes  has presented today for surgery, with the diagnosis of melena ,anemia  The various methods of treatment have been discussed with the patient and family. After consideration of risks, benefits and other options for treatment, the patient has consented to  Procedure(s): ESOPHAGOGASTRODUODENOSCOPY (EGD) WITH PROPOFOL (Left) as a surgical intervention .  The patient's history has been reviewed, patient examined, no change in status, stable for surgery.  I have reviewed the patient's chart and labs.  Questions were answered to the patient's satisfaction.    Risks (bleeding, infection, bowel perforation that could require surgery, sedation-related changes in cardiopulmonary systems), benefits (identification and possible treatment of source of symptoms, exclusion of certain causes of symptoms), and alternatives (watchful waiting, radiographic imaging studies, empiric medical treatment)  were explained to patient's family in detail in the endoscopy unit and they verbalized  Understanding  Becky Forbes

## 2017-06-27 NOTE — Transfer of Care (Signed)
Immediate Anesthesia Transfer of Care Note  Patient: MINNETTE MERIDA  Procedure(s) Performed: ESOPHAGOGASTRODUODENOSCOPY (EGD) WITH PROPOFOL (Left )  Patient Location: Endoscopy Unit  Anesthesia Type:MAC  Level of Consciousness: awake, alert , oriented and patient cooperative  Airway & Oxygen Therapy: Patient Spontanous Breathing and Patient connected to nasal cannula oxygen  Post-op Assessment: Report given to RN and Post -op Vital signs reviewed and stable  Post vital signs: Reviewed and stable  Last Vitals:  Vitals:   06/27/17 1404 06/27/17 1455  BP: (!) 104/59 (!) 102/50  Pulse: 99 99  Resp: 13 (!) 25  Temp: 36.4 C 36.5 C  SpO2: 100% 99%    Last Pain:  Vitals:   06/27/17 1455  TempSrc: Oral         Complications: No apparent anesthesia complications

## 2017-06-27 NOTE — Op Note (Addendum)
[facility] Patient Name: Becky Forbes Procedure Date : 06/27/2017 MRN: [id number] Attending MD: Otis Brace , MD Date of Birth: 1940-11-08 CSN: [id number] Age: 77 Admit Type: Inpatient Procedure:                Upper GI endoscopy Indications:              Melena Providers:                Otis Brace, MD, Burtis Junes, RN, Alan Mulder,                            Technician Referring MD:              Medicines:                Sedation Administered by an Anesthesia Professional Complications:            No immediate complications. Estimated Blood Loss:     Estimated blood loss was minimal. Procedure:                Pre-Anesthesia Assessment:                           - Prior to the procedure, a History and Physical                            was performed, and patient medications and                            allergies were reviewed. The patient's tolerance of                            previous anesthesia was also reviewed. The risks                            and benefits of the procedure and the sedation                            options and risks were discussed with the patient.                            All questions were answered, and informed consent                            was obtained. Prior Anticoagulants: The patient has                            taken Xarelto (rivaroxaban), last dose was 2 days                            prior to procedure. ASA Grade Assessment: IV - A                            patient with severe systemic disease that is a  constant threat to life. After reviewing the risks                            and benefits, the patient was deemed in                            satisfactory condition to undergo the procedure.                           After obtaining informed consent, the endoscope was                            passed under direct vision. Throughout the                            procedure, the  patient's blood pressure, pulse, and                            oxygen saturations were monitored continuously. The                            EG-[id number] ([id number]) scope was introduced through the                            mouth, and advanced to the second part of duodenum.                            The upper GI endoscopy was technically difficult                            and complex. The patient tolerated the procedure                            well. Scope In: Scope Out: Findings:      LA Grade D (one or more mucosal breaks involving at least 75% of       esophageal circumference) esophagitis with no bleeding was found in the       entire esophagus.      Medium size polyp with visible vessel and stigmata of recent bleeding       was found on the lesser curvature/fundus of the stomach. Area was       successfully injected with 3.5 ml of a 1:10,000 solution of epinephrine       for hemostasis. Estimated blood loss was minimal. patient had oozing of       blood from injection site which stopped without any intervention.      Scattered mild inflammation characterized by erosions, erythema and       friability was found in the entire examined stomach.      The duodenal bulb, first portion of the duodenum and second portion of       the duodenum were normal. Impression:               - LA Grade D esophagitis.                           -  One gastric polyp with visible blood vessel and                            stigmata of recent bleeding. Injected.                           - Gastritis.                           - Normal duodenal bulb, first portion of the                            duodenum and second portion of the duodenum.                           - No specimens collected. Moderate Sedation:      Moderate (conscious) sedation was personally administered by an       anesthesia professional. The following parameters were monitored: oxygen       saturation, heart rate, blood pressure,  and response to care. Recommendation:           - Return patient to hospital ward for ongoing care.                           - Resume previous diet.                           - Continue present medications.                           - Hold anticoagulation for additional 48 hours.                           - monitor H&H every 12 hours. Transfuse to keep                            hemoglobin around 8. Procedure Code(s):        --- Professional ---                           [phone], Esophagogastroduodenoscopy, flexible,                            transoral; with control of bleeding, any method Diagnosis Code(s):        --- Professional ---                           K20.9, Esophagitis, unspecified                           K31.7, Polyp of stomach and duodenum                           K29.70, Gastritis, unspecified, without bleeding                           K92.1, Melena (includes Hematochezia) CPT copyright  2016 American Medical Association. All rights reserved. The codes documented in this report are preliminary and upon coder review may  be revised to meet current compliance requirements. Otis Brace, MD Otis Brace, MD 06/27/2017 2:58:52 PM Number of Addenda: 0

## 2017-06-27 NOTE — Progress Notes (Signed)
   Progress Note  Patient Name: Becky Forbes Date of Encounter: 06/27/2017  Primary Cardiologist:   Mertie Moores, MD   Subjective   She did not sleep last night but otherwise feels OK.    Inpatient Medications    Scheduled Meds:  Meds reviewed on flow sheets  Continuous Infusions:  PRN Meds:    Vital Signs    114,  107/46,  Afebrile.  91% 10 liters  Telemetry    Atrial fib with rapid rate.  - Personally Reviewed  ECG     - Personally Reviewed  Physical Exam   GEN: No acute distress.   Neck: No  JVD Cardiac: Irregular RR,  murmurs, rubs, or gallops.  Respiratory: Diffuse fine crackles GI: Soft, nontender, non-distended  MS: No edema; No deformity. Neuro:  Nonfocal  Psych: Normal affect   Labs    Chemistry Recent Labs  Lab 06/21/17 0641 06/22/17 0819 06/23/17 0625 06/25/17 0652 06/26/17 1002  NA 134* 138 143 149* 146*  K 4.5 4.3 3.7 3.8 3.8  CL 92* 103 110 121* 117*  CO2 26 23 24  20* 20*  GLUCOSE 231* 177* 188* 141* 125*  BUN 166* 155* 131* 92* 73*  CREATININE 3.02* 2.55* 1.92* 1.31* 1.11*  CALCIUM 7.4* 7.3* 7.2* 7.6* 7.5*  ALBUMIN 1.6* 1.4* 1.3*  --   --   GFRNONAA 14* 17* 24* 38* 47*  GFRAA 16* 20* 28* 44* 54*  ANIONGAP 16* 12 9 8 9      Hematology Recent Labs  Lab 06/23/17 0625 06/25/17 0652 06/26/17 1722  WBC 22.8* 16.6* 13.4*  RBC 3.65* 3.47* 3.35*  HGB 10.2* 9.8* 9.5*  HCT 32.5* 31.2* 29.9*  MCV 89.0 89.9 89.3  MCH 27.9 28.2 28.4  MCHC 31.4 31.4 31.8  RDW 15.7* 16.0* 16.0*  PLT 128* 101* 91*    Cardiac Enzymes Recent Labs  Lab 06/25/17 1620 06/25/17 2140 06/26/17 0643  TROPONINI 0.07* 0.09* 0.36*   No results for input(s): TROPIPOC in the last 168 hours.   BNPNo results for input(s): BNP, PROBNP in the last 168 hours.   DDimer No results for input(s): DDIMER in the last 168 hours.   Radiology    No results found.  Cardiac Studies   NA  Patient Profile     77 y.o. female with a hx of persistent atrial fib  and atrial flutter (not on anticoagulation secondary to GI bleed), previously presumed NICM (varying EF over the years with improvement then recent decrease, no prior ischemic w/u), 3V coronary calcification on CT 05/2017, SSS s/p PPM 2015, DMII, HTN, HLD, CKD stage III (baseline -1.3), anemia, mild carotid disease by duplex 07/2016 who is being seen for the evaluation of elevated troponin at the request of Janora Norlander, NP.   Assessment & Plan    ELEVATED TROPONIN:   No further enzymes drawn after the 0.36.  Check another couple of enzymes today.  She has no chest pain and we plan conservative management.   ATRIAL FIB WITH RAPID RATE:  Rapid rate.  Our suggestion is to increase the beta blocker to tid.  I have discussed this with her care team.  DARK STOOL:  She is for EGD apparently today.   For questions or updates, please contact [place] Please consult [email or web address] for contact info under Cardiology/STEMI.   Signed, Minus Breeding, MD  06/27/2017, 8:24 AM

## 2017-06-28 ENCOUNTER — Encounter (HOSPITAL_COMMUNITY): Payer: Self-pay | Admitting: Gastroenterology

## 2017-06-28 DIAGNOSIS — L922 Granuloma faciale [eosinophilic granuloma of skin]: Secondary | ICD-10-CM | POA: Diagnosis not present

## 2017-06-28 DIAGNOSIS — K317 Polyp of stomach and duodenum: Secondary | ICD-10-CM | POA: Diagnosis not present

## 2017-06-28 LAB — TROPONIN I
TROPONIN I: 0.79 ng/mL — AB (ref <0.03)
Troponin I: 0.75 ng/mL (ref <0.03)
Troponin I: 0.61 ng/mL (ref <0.03)

## 2017-06-28 LAB — CK TOTAL AND CKMB (NOT AT ARMC)
CK TOTAL: 122 U/L (ref 38–234)
CK, MB: 14.4 ng/mL — ABNORMAL HIGH (ref 0.5–5.0)
CK, MB: 14.7 ng/mL — AB (ref 0.5–5.0)
CK, MB: 17.6 ng/mL — AB (ref 0.5–5.0)
RELATIVE INDEX: 10.9 — AB (ref 0.0–2.5)
RELATIVE INDEX: 12 — AB (ref 0.0–2.5)
RELATIVE INDEX: 13 — AB (ref 0.0–2.5)
Total CK: 162 U/L (ref 38–234)
Total CK: 111 U/L (ref 38–234)

## 2017-06-28 LAB — BLOOD GAS, ARTERIAL
ACID-BASE DEFICIT: 3.8 mmol/L — AB (ref 0.0–2.0)
Bicarbonate: 20 mmol/L (ref 20.0–28.0)
O2 CONTENT: 10 L/min
O2 Saturation: 96.2 %
PATIENT TEMPERATURE: 98.6
pCO2 arterial: 32 mmHg (ref 32.0–48.0)
pH, Arterial: 7.413 (ref 7.350–7.450)
pO2, Arterial: 84.7 mmHg (ref 83.0–108.0)

## 2017-06-28 LAB — CBC
HEMATOCRIT: 27 % — AB (ref 36.0–46.0)
Hemoglobin: 8.8 g/dL — ABNORMAL LOW (ref 12.0–15.0)
MCH: 28.9 pg (ref 26.0–34.0)
MCHC: 32.6 g/dL (ref 30.0–36.0)
MCV: 88.5 fL (ref 78.0–100.0)
Platelets: 82 10*3/uL — ABNORMAL LOW (ref 150–400)
RBC: 3.05 MIL/uL — ABNORMAL LOW (ref 3.87–5.11)
RDW: 16.2 % — AB (ref 11.5–15.5)
WBC: 12 10*3/uL — ABNORMAL HIGH (ref 4.0–10.5)

## 2017-06-28 LAB — BASIC METABOLIC PANEL
Anion gap: 7 (ref 5–15)
BUN: 55 mg/dL — ABNORMAL HIGH (ref 6–20)
CALCIUM: 7.4 mg/dL — AB (ref 8.9–10.3)
CO2: 20 mmol/L — AB (ref 22–32)
CREATININE: 0.97 mg/dL (ref 0.44–1.00)
Chloride: 114 mmol/L — ABNORMAL HIGH (ref 101–111)
GFR calc non Af Amer: 55 mL/min — ABNORMAL LOW (ref >60)
Glucose, Bld: 140 mg/dL — ABNORMAL HIGH (ref 65–99)
Potassium: 3.9 mmol/L (ref 3.5–5.1)
Sodium: 141 mmol/L (ref 135–145)

## 2017-06-28 LAB — HEMOGLOBIN AND HEMATOCRIT, BLOOD
HCT: 27.1 % — ABNORMAL LOW (ref 36.0–46.0)
HEMATOCRIT: 28.3 % — AB (ref 36.0–46.0)
HEMATOCRIT: 26.5 % — AB (ref 36.0–46.0)
HEMOGLOBIN: 8.6 g/dL — AB (ref 12.0–15.0)
Hemoglobin: 8.6 g/dL — ABNORMAL LOW (ref 12.0–15.0)
Hemoglobin: 8.7 g/dL — ABNORMAL LOW (ref 12.0–15.0)

## 2017-06-28 LAB — MAGNESIUM: Magnesium: 1.7 mg/dL (ref 1.7–2.4)

## 2017-06-29 DIAGNOSIS — L922 Granuloma faciale [eosinophilic granuloma of skin]: Secondary | ICD-10-CM | POA: Diagnosis not present

## 2017-06-29 DIAGNOSIS — K317 Polyp of stomach and duodenum: Secondary | ICD-10-CM | POA: Diagnosis not present

## 2017-06-29 LAB — CK TOTAL AND CKMB (NOT AT ARMC)
CK, MB: 8.7 ng/mL — ABNORMAL HIGH (ref 0.5–5.0)
Relative Index: INVALID (ref 0.0–2.5)
Total CK: 64 U/L (ref 38–234)

## 2017-06-29 LAB — HEMOGLOBIN AND HEMATOCRIT, BLOOD
HEMATOCRIT: 28.6 % — AB (ref 36.0–46.0)
Hemoglobin: 9.1 g/dL — ABNORMAL LOW (ref 12.0–15.0)

## 2017-06-29 LAB — TROPONIN I: Troponin I: 0.64 ng/mL (ref <0.03)

## 2017-06-29 LAB — MAGNESIUM: Magnesium: 1.8 mg/dL (ref 1.7–2.4)

## 2017-06-30 ENCOUNTER — Other Ambulatory Visit (HOSPITAL_COMMUNITY): Payer: Self-pay

## 2017-06-30 DIAGNOSIS — R918 Other nonspecific abnormal finding of lung field: Secondary | ICD-10-CM | POA: Diagnosis not present

## 2017-06-30 DIAGNOSIS — R748 Abnormal levels of other serum enzymes: Secondary | ICD-10-CM

## 2017-06-30 LAB — CK TOTAL AND CKMB (NOT AT ARMC)
CK, MB: 15.3 ng/mL — AB (ref 0.5–5.0)
RELATIVE INDEX: INVALID (ref 0.0–2.5)
Total CK: 61 U/L (ref 38–234)

## 2017-06-30 LAB — TROPONIN I
TROPONIN I: 1.51 ng/mL — AB (ref <0.03)
Troponin I: 1.42 ng/mL (ref <0.03)

## 2017-06-30 LAB — BASIC METABOLIC PANEL
Anion gap: 9 (ref 5–15)
BUN: 49 mg/dL — AB (ref 6–20)
CO2: 21 mmol/L — AB (ref 22–32)
Calcium: 7.4 mg/dL — ABNORMAL LOW (ref 8.9–10.3)
Chloride: 113 mmol/L — ABNORMAL HIGH (ref 101–111)
Creatinine, Ser: 0.97 mg/dL (ref 0.44–1.00)
GFR calc Af Amer: >60 mL/min (ref >60)
GFR, EST NON AFRICAN AMERICAN: 55 mL/min — AB (ref >60)
GLUCOSE: 126 mg/dL — AB (ref 65–99)
POTASSIUM: 4.3 mmol/L (ref 3.5–5.1)
Sodium: 143 mmol/L (ref 135–145)

## 2017-06-30 NOTE — Progress Notes (Signed)
Progress Note  Patient Name: Becky Forbes Date of Encounter: 06/30/2017  Primary Cardiologist:   Mertie Moores, MD   Subjective   She is very weak.  Perhaps more SOB than previous.  Today is a good day.  Yesterday she was much more fatigued.    Inpatient Medications     See meds listed elsewhere:  Reviewed by me 06/30/2017   Vital Signs    VS:   HR 85   , Temp afebrile,   103/45   Telemetry    Atrial fib with reasonable rate control - Personally Reviewed  ECG    NA - Personally Reviewed  Physical Exam   GEN: No acute distress.  Very weak and frail Neck: No  JVD Cardiac: Irregular RR, no murmurs, rubs, or gallops.  Respiratory:    Decreased breath sounds with bilateral crackles GI: Soft, nontender, non-distended  MS:   Moderately severe edema edema; No deformity. Neuro:  Nonfocal  Psych: Normal affect   Labs    Chemistry Recent Labs  Lab 06/27/17 0913 06/28/17 0618 06/30/17 0821  NA 145 141 143  K 4.1 3.9 4.3  CL 115* 114* 113*  CO2 21* 20* 21*  GLUCOSE 90 140* 126*  BUN 60* 55* 49*  CREATININE 0.99 0.97 0.97  CALCIUM 7.5* 7.4* 7.4*  GFRNONAA 54* 55* 55*  GFRAA >60 >60 >60  ANIONGAP 9 7 9      Hematology Recent Labs  Lab 06/26/17 1722 06/27/17 0913  06/28/17 0618 06/28/17 1143 06/28/17 2318 06/29/17 0829  WBC 13.4* 14.8*  --  12.0*  --   --   --   RBC 3.35* 3.24*  --  3.05*  --   --   --   HGB 9.5* 9.3*   < > 8.8* 8.6* 8.6* 9.1*  HCT 29.9* 29.0*   < > 27.0* 28.3* 26.5* 28.6*  MCV 89.3 89.5  --  88.5  --   --   --   MCH 28.4 28.7  --  28.9  --   --   --   MCHC 31.8 32.1  --  32.6  --   --   --   RDW 16.0* 16.2*  --  16.2*  --   --   --   PLT 91* 87*  --  82*  --   --   --    < > = values in this interval not displayed.    Cardiac Enzymes Recent Labs  Lab 06/28/17 1143 06/29/17 0821 06/30/17 0821 06/30/17 1324  TROPONINI 0.61* 0.64* 1.42* 1.51*   No results for input(s): TROPIPOC in the last 168 hours.   BNPNo results for  input(s): BNP, PROBNP in the last 168 hours.   DDimer No results for input(s): DDIMER in the last 168 hours.   Radiology    Dg Chest Port 1 View  Result Date: 06/30/2017 CLINICAL DATA:  Patient admitted to the hospital 06/27/2017 for worsened chronic respiratory disease. EXAM: PORTABLE CHEST 1 VIEW COMPARISON:  Single-view of the chest 06/18/2017 and 06/14/2017. CT chest 06/06/2017. FINDINGS: Bilateral airspace disease seen on the most recent examination has worsened, particularly in the right upper lobe and lingula. Opacity in left lung base is again seen. There is cardiomegaly. Pacing device is in place. Aortic atherosclerosis is noted. No pneumothorax. IMPRESSION: Worsened bilateral airspace disease compatible with progressive pneumonia or edema. Electronically Signed   By: Inge Rise M.D.   On: 06/30/2017 09:27    Cardiac Studies   ECHO  06/03/17  Study Conclusions  - Left ventricle: The cavity size was mildly dilated. Systolic   function was moderately to severely reduced. The estimated   ejection fraction was in the range of 30% to 35%. Diffuse   hypokinesis. - Aortic valve: Noncoronary cusp mobility was restricted.   Transvalvular velocity was within the normal range. There was no   stenosis. There was no regurgitation. - Mitral valve: Mildly calcified annulus. Transvalvular velocity   was within the normal range. There was no evidence for stenosis.   There was mild to moderate regurgitation. - Left atrium: The atrium was severely dilated. - Right ventricle: The cavity size was normal. Wall thickness was   normal. Systolic function was normal. - Right atrium: The atrium was severely dilated. - Atrial septum: No defect or patent foramen ovale was identified   by color flow Doppler. - Tricuspid valve: There was mild regurgitation. - Pulmonary arteries: Systolic pressure was within the normal   range. PA peak pressure: 27 mm Hg (S).   Patient Profile     77 y.o.  female with a hx of persistentatrial fib and atrial flutter (not on anticoagulation secondary to GI bleed),previouslypresumedNICM(varying EF over the yearswith improvement then recent decrease, no prior ischemic w/u), 3V coronary calcification on CT 05/2017, SSS s/p PPM 2015,DMII, HTN, HLD, CKD stage III (baseline -1.3),anemia, mildcarotid disease by duplex 4/2018who is being seen for the evaluation of elevated troponinat the request of Janora Norlander, NP.  Assessment & Plan    ELEVATED TROPONIN:   Troponin continues to be mildly elevated.   She might have an ongoing myocarditis.  CK total was not elevated but MB is elevated.  I will repeat an echo.  However, I think our options are limited to medical therapy.  We can continue to try to titrate the beta blocker although not today as the BP has been soft.      CARDIOMYOPATHY:  Discussed with the family and the patient.  Given the severity of the comorbid conditions I would not suggest cardiac cath at this point.  They agree.  I do not think that there is any further merit in cycling enzymes.    CKD III:  Creat seems to be stable.  I would suggest gentle diuresis per the primary team if her   BP allows.   ATRIAL FIB:  Rate was elevated yesterday but OK today.  No specific change in therapy.   For questions or updates, please contact [place] Please consult [email or web address] for contact info under Cardiology/STEMI.   Signed, Minus Breeding, MD  06/30/2017, 3:23 PM

## 2017-07-01 ENCOUNTER — Other Ambulatory Visit (HOSPITAL_COMMUNITY): Payer: Self-pay

## 2017-07-01 LAB — RENAL FUNCTION PANEL
ALBUMIN: 1.4 g/dL — AB (ref 3.5–5.0)
ANION GAP: 10 (ref 5–15)
BUN: 46 mg/dL — ABNORMAL HIGH (ref 6–20)
CHLORIDE: 110 mmol/L (ref 101–111)
CO2: 21 mmol/L — ABNORMAL LOW (ref 22–32)
Calcium: 7.6 mg/dL — ABNORMAL LOW (ref 8.9–10.3)
Creatinine, Ser: 0.92 mg/dL (ref 0.44–1.00)
GFR, EST NON AFRICAN AMERICAN: 59 mL/min — AB (ref >60)
Glucose, Bld: 72 mg/dL (ref 65–99)
PHOSPHORUS: 2.4 mg/dL — AB (ref 2.5–4.6)
POTASSIUM: 4.2 mmol/L (ref 3.5–5.1)
Sodium: 141 mmol/L (ref 135–145)

## 2017-07-01 LAB — CBC
HCT: 28.2 % — ABNORMAL LOW (ref 36.0–46.0)
HEMOGLOBIN: 9 g/dL — AB (ref 12.0–15.0)
MCH: 28.4 pg (ref 26.0–34.0)
MCHC: 31.9 g/dL (ref 30.0–36.0)
MCV: 89 fL (ref 78.0–100.0)
PLATELETS: 73 10*3/uL — AB (ref 150–400)
RBC: 3.17 MIL/uL — AB (ref 3.87–5.11)
RDW: 16.8 % — ABNORMAL HIGH (ref 11.5–15.5)
WBC: 5.5 10*3/uL (ref 4.0–10.5)

## 2017-07-01 LAB — LACTIC ACID, PLASMA: LACTIC ACID, VENOUS: 1.6 mmol/L (ref 0.5–1.9)

## 2017-07-01 LAB — MAGNESIUM: MAGNESIUM: 1.6 mg/dL — AB (ref 1.7–2.4)

## 2017-07-01 NOTE — Progress Notes (Signed)
[facility] Progress Note   Echo has not been completed yet.  Patient will be evaluated again once this information is available.   Becky Forbes C. Becky Linsey, MD, [facility] 07/01/2017 9:11 AM

## 2017-07-02 ENCOUNTER — Other Ambulatory Visit (HOSPITAL_BASED_OUTPATIENT_CLINIC_OR_DEPARTMENT_OTHER): Payer: Self-pay

## 2017-07-02 DIAGNOSIS — I340 Nonrheumatic mitral (valve) insufficiency: Secondary | ICD-10-CM

## 2017-07-02 LAB — RENAL FUNCTION PANEL
ALBUMIN: 2 g/dL — AB (ref 3.5–5.0)
ANION GAP: 7 (ref 5–15)
BUN: 41 mg/dL — AB (ref 6–20)
CHLORIDE: 112 mmol/L — AB (ref 101–111)
CO2: 24 mmol/L (ref 22–32)
Calcium: 7.8 mg/dL — ABNORMAL LOW (ref 8.9–10.3)
Creatinine, Ser: 0.87 mg/dL (ref 0.44–1.00)
GFR calc Af Amer: >60 mL/min (ref >60)
GFR calc non Af Amer: >60 mL/min (ref >60)
GLUCOSE: 157 mg/dL — AB (ref 65–99)
POTASSIUM: 3.8 mmol/L (ref 3.5–5.1)
Phosphorus: 2.4 mg/dL — ABNORMAL LOW (ref 2.5–4.6)
Sodium: 143 mmol/L (ref 135–145)

## 2017-07-02 LAB — CBC
HEMATOCRIT: 25.2 % — AB (ref 36.0–46.0)
HEMOGLOBIN: 8.1 g/dL — AB (ref 12.0–15.0)
MCH: 28.1 pg (ref 26.0–34.0)
MCHC: 32.1 g/dL (ref 30.0–36.0)
MCV: 87.5 fL (ref 78.0–100.0)
Platelets: 76 10*3/uL — ABNORMAL LOW (ref 150–400)
RBC: 2.88 MIL/uL — ABNORMAL LOW (ref 3.87–5.11)
RDW: 16.9 % — ABNORMAL HIGH (ref 11.5–15.5)
WBC: 4.7 10*3/uL (ref 4.0–10.5)

## 2017-07-02 LAB — ECHOCARDIOGRAM LIMITED

## 2017-07-02 LAB — MAGNESIUM: MAGNESIUM: 1.5 mg/dL — AB (ref 1.7–2.4)

## 2017-07-02 NOTE — Progress Notes (Deleted)
Electrophysiology Office Note Date: 07/02/2017  ID:  Becky Forbes, DOB [birth year], MRN [id number]  PCP: Binnie Rail, MD Primary Cardiologist: Nahser Electrophysiologist: All[place]  CC: Pacemaker follow-up  Becky Forbes is a 77 y.o. female seen today for Dr Rayann Heman.  She presents today for routine electrophysiology followup.  Since last being seen in our clinic, the patient reports doing reasonably well.  Her husband fell on the ice in January and had a ICH for which he was hospitalized for several months.  She has been caring for him and has some increased fatigue.  She denies chest pain, palpitations, dyspnea, PND, orthopnea, nausea, vomiting, dizziness, syncope, edema, weight gain, or early satiety.  Device History: STJ dual chamber PPM implanted 2015 for SSS   Past Medical History:  Diagnosis Date  . Acute hypoxemic respiratory failure ([place])    a. 05/2017 - ? ARDS from bromocriptine vs amiodarone -> discharged to [facility].  . Anemia    hx  . Arthritis   . Carotid artery disease ([place])    a. Carotid duplex 07/2016: 1-39% bilaterally, f/u PRN recommended.  . Chronic systolic CHF (congestive heart failure) ([place])    a. presumed NICM with varying EF over the years. No prior ischemic workup.  . CKD (chronic kidney disease), stage III ([place])    no nephrologist   . Complication of anesthesia   . Coronary artery calcification seen on CT scan   . Diabetes mellitus    x 10 yrs  . GI bleed    a. 04/2012 - lower GIB - felt 2/2 mild ischemic colitis as shown on colonoscopy, was cleared for anticoag 1 week out from procedure.  Marland Kitchen Heart murmur    hx  . HTN (hypertension)   . Hyperlipemia   . Hypothyroidism   . Ischemic colitis ([place])    a. 04/2012 - lower GIB - felt 2/2 mild ischemic colitis as shown on colonoscopy..  . Persistent atrial fibrillation ([place])   . PONV (postoperative nausea and vomiting)   . 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 [place] DR   dual-chamber pacemaker 03/2014 by Dr. Rayann Heman for sick sinus/sx bradycardia  . Renal insufficiency    Past Surgical History:  Procedure Laterality Date  . ABDOMINAL HYSTERECTOMY    . APPENDECTOMY    . BACK SURGERY    . CARPAL TUNNEL RELEASE Right   . CATARACT EXTRACTION  05/25/2007   RIGHT EYE,left  . COLONOSCOPY  01/31/2009   small external/internal hemorrhoids, diverticulosis in sigmoid colon, one small polyp (biopsied). Biospy essentially normal. Dr.Magod  . COLONOSCOPY  04/30/2012   Procedure: COLONOSCOPY;  Surgeon: Cleotis Nipper, MD;  Location: [facility] ENDOSCOPY;  Service: Endoscopy;  Laterality: N/A;  unprepped, poss flex only  . ESOPHAGOGASTRODUODENOSCOPY (EGD) WITH PROPOFOL Left 06/27/2017   Procedure: ESOPHAGOGASTRODUODENOSCOPY (EGD) WITH PROPOFOL;  Surgeon: Otis Brace, MD;  Location: [facility];  Service: Gastroenterology;  Laterality: Left;  . FOOT SURGERY Left 2006   MID FOOT RECONSTRUCTION  . GANGLION CYST EXCISION Right 12/2007   Dr.Gramig hand  . JOINT REPLACEMENT     bilateral  . LUMBAR LAMINECTOMY     x3 one fusion  . OSTEOTOMY Left 2006   AND FUSION  . PARS PLANA VITRECTOMY W/ REPAIR OF MACULAR HOLE Right 2010  . PERMANENT PACEMAKER INSERTION N/A 03/29/2014   STJ Assurity dual chamber paceamker implnated by Dr Rayann Heman  . TOTAL KNEE ARTHROPLASTY Bilateral 05/02/08, 07/25/08   Dr.Alusio  No current outpatient medications on file.   No current facility-administered medications for this visit.     Allergies:   Morphine and related; Januvia [sitagliptin phosphate]; Amiodarone; Bromocriptine; Actos [pioglitazone hydrochloride]; Hydrocodone-acetaminophen; Keflex [cephalexin]; and Percocet [oxycodone-acetaminophen]   Social History: Social History   Socioeconomic History  . Marital status: Married    Spouse name: JAMES  . Number of children: 2  . Years of education: Not on file  . Highest education level: Not on file  Social Needs  . Financial resource  strain: Not on file  . Food insecurity - worry: Not on file  . Food insecurity - inability: Not on file  . Transportation needs - medical: Not on file  . Transportation needs - non-medical: Not on file  Occupational History  . Occupation: RETIRED RN    Employer: [employer]    Comment: RETIRED IN 2007  Tobacco Use  . Smoking status: Never Smoker  . Smokeless tobacco: Never Used  Substance and Sexual Activity  . Alcohol use: No  . Drug use: No  . Sexual activity: Not Currently  Other Topics Concern  . Not on file  Social History Narrative  . Not on file    Family History: Family History  Problem Relation Age of Onset  . Coronary artery disease Father   . Hypertension Father   . Stroke Father   . Diabetes Father   . Kidney failure Mother   . Hypertension Mother   . Other Brother        SEPSIS  . Diabetes Brother   . Diabetes Sister   . Kidney failure Sister      Review of Systems: All other systems reviewed and are otherwise negative except as noted above.   Physical Exam: VS:  There were no vitals taken for this visit. , BMI There is no height or weight on file to calculate BMI.  GEN- The patient is well appearing, alert and oriented x 3 today.   HEENT: normocephalic, atraumatic; sclera clear, conjunctiva pink; hearing intact; oropharynx clear; neck supple  Lungs- Clear to ausculation bilaterally, normal work of breathing.  No wheezes, rales, rhonchi Heart- Regular rate and rhythm  GI- soft, non-tender, non-distended, bowel sounds present Extremities- no clubbing, cyanosis, or edema  MS- no significant deformity or atrophy Skin- warm and dry, no rash or lesion; PPM pocket well healed Psych- euthymic mood, full affect Neuro- strength and sensation are intact  PPM Interrogation- reviewed in detail today,  See PACEART report  EKG:  EKG is not ordered today.  Recent Labs: 06/14/2017: ALT 24; B Natriuretic Peptide 244.3 06/26/2017: TSH  0.500 07/02/2017: BUN 41; Creatinine, Ser 0.87; Hemoglobin 8.1; Magnesium 1.5; Platelets 76; Potassium 3.8; Sodium 143   Wt Readings from Last 3 Encounters:  06/13/17 151 lb 10.8 oz (68.8 kg)  06/02/17 163 lb (73.9 kg)  05/09/17 168 lb 6.4 oz (76.4 kg)     Other studies Reviewed: Additional studies/ records that were reviewed today include: Dr Acie Fredrickson and Dr Jackalyn Lombard office notes  Assessment and Plan:  1.  Sick sinus syndrome Normal PPM function See Pace Art report No changes today  2.  Paroxysmal atrial fibrillation Burden by device interrogation <1% V rates controlled CHADS2VASC of 5. She was previously on Warfarin and had a GI bleed with subtherapeutic INR. We discussed Eliquis vs Watchman today. She is concerned about long term anticoagulation. While her AF burden is low, her CHADS2VASC is high. I will call Dr Watt Climes to see  if he thinks she would be candidate for trial of Eliquis and call her back next week.  Continue low dose amiodarone. TSH, LFT's followed by PCP  3.  HTN Stable No change required today  Current medicines are reviewed at length with the patient today.   The patient does not have concerns regarding her medicines.  The following changes were made today:  none  Labs/ tests ordered today include: none No orders of the defined types were placed in this encounter.    Disposition:   Follow up with [place], Dr Rayann Heman 1 year, Dr Acie Fredrickson as scheduled.      Signed, Chanetta Marshall, NP 07/02/2017 3:30 PM  [address] [id number] [phone] (office) [phone] (fax)

## 2017-07-02 NOTE — Progress Notes (Signed)
  Echocardiogram 2D Echocardiogram has been performed.  Becky Forbes 07/02/2017, 8:45 AM

## 2017-07-03 ENCOUNTER — Ambulatory Visit: Payer: Medicare Other | Admitting: Internal Medicine

## 2017-07-03 ENCOUNTER — Encounter: Payer: Self-pay | Admitting: Internal Medicine

## 2017-07-03 ENCOUNTER — Ambulatory Visit: Payer: Medicare Other

## 2017-07-03 DIAGNOSIS — N183 Chronic kidney disease, stage 3 unspecified: Secondary | ICD-10-CM | POA: Diagnosis present

## 2017-07-03 DIAGNOSIS — I482 Chronic atrial fibrillation, unspecified: Secondary | ICD-10-CM | POA: Diagnosis present

## 2017-07-03 DIAGNOSIS — I5022 Chronic systolic (congestive) heart failure: Secondary | ICD-10-CM | POA: Diagnosis present

## 2017-07-03 DIAGNOSIS — J9621 Acute and chronic respiratory failure with hypoxia: Secondary | ICD-10-CM | POA: Diagnosis present

## 2017-07-03 LAB — RENAL FUNCTION PANEL
ALBUMIN: 2.3 g/dL — AB (ref 3.5–5.0)
ANION GAP: 10 (ref 5–15)
BUN: 35 mg/dL — ABNORMAL HIGH (ref 6–20)
CO2: 25 mmol/L (ref 22–32)
Calcium: 7.9 mg/dL — ABNORMAL LOW (ref 8.9–10.3)
Chloride: 108 mmol/L (ref 101–111)
Creatinine, Ser: 0.87 mg/dL (ref 0.44–1.00)
GFR calc Af Amer: >60 mL/min (ref >60)
Glucose, Bld: 146 mg/dL — ABNORMAL HIGH (ref 65–99)
PHOSPHORUS: 2.4 mg/dL — AB (ref 2.5–4.6)
POTASSIUM: 3.5 mmol/L (ref 3.5–5.1)
Sodium: 143 mmol/L (ref 135–145)

## 2017-07-03 LAB — CBC
HEMATOCRIT: 23.7 % — AB (ref 36.0–46.0)
Hemoglobin: 7.7 g/dL — ABNORMAL LOW (ref 12.0–15.0)
MCH: 28.4 pg (ref 26.0–34.0)
MCHC: 32.5 g/dL (ref 30.0–36.0)
MCV: 87.5 fL (ref 78.0–100.0)
Platelets: 70 10*3/uL — ABNORMAL LOW (ref 150–400)
RBC: 2.71 MIL/uL — AB (ref 3.87–5.11)
RDW: 17.4 % — ABNORMAL HIGH (ref 11.5–15.5)
WBC: 4.3 10*3/uL (ref 4.0–10.5)

## 2017-07-03 LAB — MAGNESIUM: Magnesium: 1.7 mg/dL (ref 1.7–2.4)

## 2017-07-03 NOTE — Progress Notes (Signed)
Progress Note  Patient Name: Becky Forbes Date of Encounter: 07/03/2017  Primary Cardiologist:   Mertie Moores, MD   Subjective   She is asleep this morning and did not wake up with gentle prompting.  No distress  Inpatient Medications     Meds reviewed.  Please refer to med sheets.   Vital Signs    VS:  88 HR,  96.4 Temp,  22 RR,  127/74  BP  Telemetry    Atrial fib, with controlled rate.  - Personally Reviewed  ECG    NA - Personally Reviewed  Physical Exam   GEN: No acute distress.   Neck: No  JVD Cardiac: Irregular RR, no murmurs, rubs, or gallops.  Respiratory:   Diffuse fine crackles throughout GI: Soft, nontender, non-distended  MS:   Diffuse dependent edema; No deformity. Neuro:  Unable to assess Psych:   Unable to assess  Labs    Chemistry Recent Labs  Lab 07/01/17 0556 07/02/17 0745 07/03/17 0505  NA 141 143 143  K 4.2 3.8 3.5  CL 110 112* 108  CO2 21* 24 25  GLUCOSE 72 157* 146*  BUN 46* 41* 35*  CREATININE 0.92 0.87 0.87  CALCIUM 7.6* 7.8* 7.9*  ALBUMIN 1.4* 2.0* 2.3*  GFRNONAA 59* >60 >60  GFRAA >60 >60 >60  ANIONGAP 10 7 10      Hematology Recent Labs  Lab 07/01/17 0556 07/02/17 0745 07/03/17 0505  WBC 5.5 4.7 4.3  RBC 3.17* 2.88* 2.71*  HGB 9.0* 8.1* 7.7*  HCT 28.2* 25.2* 23.7*  MCV 89.0 87.5 87.5  MCH 28.4 28.1 28.4  MCHC 31.9 32.1 32.5  RDW 16.8* 16.9* 17.4*  PLT 73* 76* 70*    Cardiac Enzymes Recent Labs  Lab 06/28/17 1143 06/29/17 0821 06/30/17 0821 06/30/17 1324  TROPONINI 0.61* 0.64* 1.42* 1.51*   No results for input(s): TROPIPOC in the last 168 hours.   BNPNo results for input(s): BNP, PROBNP in the last 168 hours.   DDimer No results for input(s): DDIMER in the last 168 hours.   Radiology    No results found.  Cardiac Studies   Echo:  07/02/17  Study Conclusions  - Left ventricle: The cavity size was moderately dilated. Wall   thickness was increased in a pattern of moderate LVH.  Systolic   function was moderately reduced. The estimated ejection fraction   was in the range of 35% to 40%. - Mitral valve: There was moderate regurgitation. - Left atrium: The atrium was moderately dilated. - Tricuspid valve: There was mild-moderate regurgitation. - Pulmonary arteries: Systolic pressure was moderately increased.   PA peak pressure: 50 mm Hg (S).  Patient Profile     77 y.o. female with a hx of persistentatrial fib and atrial flutter (not on anticoagulation secondary to GI bleed),previouslypresumedNICM(varying EF over the yearswith improvement then recent decrease, no prior ischemic w/u), 3V coronary calcification on CT 05/2017, SSS s/p PPM 2015,DMII, HTN, HLD, CKD stage III (baseline -1.3),anemia, mildcarotid disease by duplex 4/2018who is being seen for the evaluation of elevated troponinat the request of Janora Norlander, NP.   Assessment & Plan    ELEVATED TROPONIN:    No evidence of falling EF.  She does not complain of pain.  Baseline SOB.  Given the overall frail state, no further testing is indicated.    CKD III:  Follow per primary team  ATRIAL FIB:  Rate goes up at times but for the most part is controlled on the current beta  blocker dose and schedule.  Continue.    CARDIOMYOPATHY:  Conservative therapy.  Diuresis as able per the primary team.  We will sign off.    For questions or updates, please contact [facility] Please consult [email or web address] for contact info under Cardiology/STEMI.   Signed, Minus Breeding, MD  07/03/2017, 8:24 AM

## 2017-07-03 NOTE — Progress Notes (Signed)
[facility]MON[facility]  Date of Service:  July 03, 2017  Follow Up Progress Note   Becky Forbes  [id number]  DOB: 05-24-1940   DOA: 06/27/2017  Referring Physician: Merton Border, MD   HPI: Becky Forbes is a 77 y.o. female seen for follow up of Acute on Chronic Respiratory Failure.  She is gradually weaning on her FiO2.  We have been able to bring her down to 3 L.  She still has significant desaturations with any kind of activity.  She currently has saturations of 93% on 3 L.   Review of Systems:   ROS performed and is unremarkable other than noted above.  Past Medical History:  Diagnosis Date  . Acute hypoxemic respiratory failure ([place])    a. 05/2017 - ? ARDS from bromocriptine vs amiodarone -> discharged to [facility].  . Acute on chronic respiratory failure with hypoxia ([place])   . Anemia    hx  . Arthritis   . Carotid artery disease ([place])    a. Carotid duplex 07/2016: 1-39% bilaterally, f/u PRN recommended.  . Chronic atrial fibrillation ([place])   . Chronic kidney disease, stage 3 ([place])   . Chronic systolic CHF (congestive heart failure) ([place])    a. presumed NICM with varying EF over the years. No prior ischemic workup.  . Chronic systolic heart failure ([place])   . CKD (chronic kidney disease), stage III ([place])    no nephrologist   . Complication of anesthesia   . Coronary artery calcification seen on CT scan   . Diabetes mellitus    x 10 yrs  . GI bleed    a. 04/2012 - lower GIB - felt 2/2 mild ischemic colitis as shown on colonoscopy, was cleared for anticoag 1 week out from procedure.  Marland Kitchen Heart murmur    hx  . HTN (hypertension)   . Hyperlipemia   . Hypothyroidism   . Ischemic colitis ([place])    a. 04/2012 - lower GIB - felt 2/2 mild ischemic colitis as shown on colonoscopy..  . Persistent atrial fibrillation ([place])   . PONV (postoperative nausea and vomiting)   . 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 [place] DR  dual-chamber pacemaker 03/2014 by Dr. Rayann Heman for sick sinus/sx bradycardia  . Renal insufficiency   . Sepsis [facility])     Past Surgical History:  Procedure Laterality Date  . ABDOMINAL HYSTERECTOMY    . APPENDECTOMY    . BACK SURGERY    . CARPAL TUNNEL RELEASE Right   . CATARACT EXTRACTION  05/25/2007   RIGHT EYE,left  . COLONOSCOPY  01/31/2009   small external/internal hemorrhoids, diverticulosis in sigmoid colon, one small polyp (biopsied). Biospy essentially normal. Dr.Magod  . COLONOSCOPY  04/30/2012   Procedure: COLONOSCOPY;  Surgeon: Cleotis Nipper, MD;  Location: [facility] ENDOSCOPY;  Service: Endoscopy;  Laterality: N/A;  unprepped, poss flex only  . ESOPHAGOGASTRODUODENOSCOPY (EGD) WITH PROPOFOL Left 06/27/2017   Procedure: ESOPHAGOGASTRODUODENOSCOPY (EGD) WITH PROPOFOL;  Surgeon: Otis Brace, MD;  Location: [facility];  Service: Gastroenterology;  Laterality: Left;  . FOOT SURGERY Left 2006   MID FOOT RECONSTRUCTION  . GANGLION CYST EXCISION Right 12/2007   Dr.Gramig hand  . JOINT REPLACEMENT     bilateral  . LUMBAR LAMINECTOMY     x3 one fusion  . OSTEOTOMY Left 2006   AND FUSION  . PARS PLANA VITRECTOMY W/ REPAIR OF MACULAR HOLE Right 2010  . PERMANENT  PACEMAKER INSERTION N/A 03/29/2014   STJ Assurity dual chamber paceamker implnated by Dr Rayann Heman  . TOTAL KNEE ARTHROPLASTY Bilateral 05/02/08, 07/25/08   Dr.Alusio    Social History:    reports that she has never smoked. She has never used smokeless tobacco. She reports that she does not drink alcohol or use drugs.  Family History: Non-Contributory to the present illness  Allergies  Allergen Reactions  . Morphine And Related Itching, Nausea And Vomiting, Rash and Other (See Comments)    01/04/15- patient says she is NOT allergic to morphine.  Morphine IV given this AM 01/04/15 in the ED. No adverse reaction.  Celesta Gentile [Sitagliptin Phosphate] Other (See Comments)    Pt says this caused  elevated liver enzymes and creatinine. IS TAKING 1/2 TABLET  . Amiodarone     Possible pulmonary toxicity  . Bromocriptine     Possible contributor to pulmonary issues  . Actos [Pioglitazone Hydrochloride] Swelling    UNSPECIFIED REACTION   . Hydrocodone-Acetaminophen Itching, Nausea And Vomiting and Other (See Comments)    Per pt 05/12/15 she had this recently in the hospital with no issues at all  . Keflex [Cephalexin] Rash and Other (See Comments)    Burning sensation all over  . Percocet [Oxycodone-Acetaminophen] Nausea And Vomiting    Medications:  Reviewed on Rounds  Physical Exam:  Vitals:  Temperature 98.0 degrees pulse 80 respiratory 22 blood pressure 138/82 saturations 93%  Ventilator Settings  Currently she is off of the ventilator on 3 L nasal cannula  . General: Comfortable at this time . Eyes: Grossly normal lids, irises & conjunctiva . ENT: grossly tongue is normal . Neck: no obvious mass . Cardiovascular:  S1-S2 normal no gallop or rub . Respiratory:  Coarse breath sounds with few dry crackles noted . Abdomen:  Soft nontender . Skin: no rash seen on limited exam . Musculoskeletal: not rigid . Psychiatric:unable to assess . Neurologic: no seizure no involuntary movements          Labs on Admission:  Basic Metabolic Panel: Recent Labs  Lab 06/28/17 0618 06/29/17 0821 06/30/17 0821 07/01/17 0556 07/02/17 0745 07/03/17 0505  NA 141  --  143 141 143 143  K 3.9  --  4.3 4.2 3.8 3.5  CL 114*  --  113* 110 112* 108  CO2 20*  --  21* 21* 24 25  GLUCOSE 140*  --  126* 72 157* 146*  BUN 55*  --  49* 46* 41* 35*  CREATININE 0.97  --  0.97 0.92 0.87 0.87  CALCIUM 7.4*  --  7.4* 7.6* 7.8* 7.9*  MG 1.7 1.8  --  1.6* 1.5* 1.7  PHOS  --   --   --  2.4* 2.4* 2.4*    Liver Function Tests: Recent Labs  Lab 07/01/17 0556 07/02/17 0745 07/03/17 0505  ALBUMIN 1.4* 2.0* 2.3*   No results for input(s): LIPASE, AMYLASE in the last 168 hours. No results for  input(s): AMMONIA in the last 168 hours.  CBC: Recent Labs  Lab 06/27/17 0913  06/28/17 0618  06/28/17 2318 06/29/17 0829 07/01/17 0556 07/02/17 0745 07/03/17 0505  WBC 14.8*  --  12.0*  --   --   --  5.5 4.7 4.3  HGB 9.3*   < > 8.8*   < > 8.6* 9.1* 9.0* 8.1* 7.7*  HCT 29.0*   < > 27.0*   < > 26.5* 28.6* 28.2* 25.2* 23.7*  MCV 89.5  --  88.5  --   --   --  89.0 87.5 87.5  PLT 87*  --  82*  --   --   --  73* 76* 70*   < > = values in this interval not displayed.    Cardiac Enzymes: Recent Labs  Lab 06/28/17 0007 06/28/17 0618 06/28/17 1143 06/29/17 0821 06/30/17 0821 06/30/17 1324  CKTOTAL 162 122 111 64  --  61  CKMB 17.6* 14.7* 14.4* 8.7*  --  15.3*  TROPONINI 0.79* 0.75* 0.61* 0.64* 1.42* 1.51*    BNP (last 3 results) Recent Labs    06/02/17 1123 06/14/17 0552  BNP 2,340.8* 244.3*    ProBNP (last 3 results) No results for input(s): PROBNP in the last 8760 hours.   Radiological Exams on Admission: Dg Chest Port 1 View  Result Date: 06/30/2017 CLINICAL DATA:  Patient admitted to the hospital 06/27/2017 for worsened chronic respiratory disease. EXAM: PORTABLE CHEST 1 VIEW COMPARISON:  Single-view of the chest 06/18/2017 and 06/14/2017. CT chest 06/06/2017. FINDINGS: Bilateral airspace disease seen on the most recent examination has worsened, particularly in the right upper lobe and lingula. Opacity in left lung base is again seen. There is cardiomegaly. Pacing device is in place. Aortic atherosclerosis is noted. No pneumothorax. IMPRESSION: Worsened bilateral airspace disease compatible with progressive pneumonia or edema. Electronically Signed   By: Inge Rise M.D.   On: 06/30/2017 09:27       Assessment/Plan Active Problems:   Acute on chronic respiratory failure with hypoxia (HCC)   Chronic systolic heart failure (HCC)   Chronic atrial fibrillation (HCC)   Chronic kidney disease, stage 3 (HCC)   1.  acute on chronic respiratory failure with  hypoxia at this time she is doing well were trying to wean the FiO2 down.  Seems to be tolerating it fairly well so far. 2. Pulmonary fibrosis will continue with supportive care 3. Chronic systolic heart failure echo results were noted cardiology evaluation appreciated.  She has an ejection fraction about 35-40% with elevation in her PA pressures of moderate severity 4. Chronic atrial fibrillation currently her rate is controlled we will continue with present management 5. Chronic kidney disease stage 3 renal function is adequately controlled will continue to monitor closely      I have personally seen and evaluated the patient, evaluated laboratory and imaging results, formulated the assessment and plan and placed orders. The Patient requires high complexity decision making for assessment and support.  Case was discussed on Rounds with the Respiratory Therapy Staff   Allyne Gee, MD [facility] Pulmonary Critical Care Medicine Sleep Medicine

## 2017-07-04 ENCOUNTER — Encounter: Payer: Medicare Other | Admitting: Nurse Practitioner

## 2017-07-04 LAB — BASIC METABOLIC PANEL
Anion gap: 13 (ref 5–15)
BUN: 34 mg/dL — AB (ref 6–20)
CHLORIDE: 108 mmol/L (ref 101–111)
CO2: 25 mmol/L (ref 22–32)
Calcium: 8.2 mg/dL — ABNORMAL LOW (ref 8.9–10.3)
Creatinine, Ser: 0.93 mg/dL (ref 0.44–1.00)
GFR calc Af Amer: >60 mL/min (ref >60)
GFR calc non Af Amer: 58 mL/min — ABNORMAL LOW (ref >60)
GLUCOSE: 129 mg/dL — AB (ref 65–99)
Potassium: 3.6 mmol/L (ref 3.5–5.1)
Sodium: 146 mmol/L — ABNORMAL HIGH (ref 135–145)

## 2017-07-04 LAB — CBC
HCT: 26 % — ABNORMAL LOW (ref 36.0–46.0)
HEMOGLOBIN: 8.1 g/dL — AB (ref 12.0–15.0)
MCH: 27.6 pg (ref 26.0–34.0)
MCHC: 31.2 g/dL (ref 30.0–36.0)
MCV: 88.7 fL (ref 78.0–100.0)
Platelets: 94 10*3/uL — ABNORMAL LOW (ref 150–400)
RBC: 2.93 MIL/uL — ABNORMAL LOW (ref 3.87–5.11)
RDW: 18 % — ABNORMAL HIGH (ref 11.5–15.5)
WBC: 4.2 10*3/uL (ref 4.0–10.5)

## 2017-07-04 LAB — MAGNESIUM: Magnesium: 1.7 mg/dL (ref 1.7–2.4)

## 2017-07-04 LAB — PHOSPHORUS: Phosphorus: 2.3 mg/dL — ABNORMAL LOW (ref 2.5–4.6)

## 2017-07-04 NOTE — Progress Notes (Signed)
Pulmonary [facility] PULMONARY SERVICE  Date of Service: July 04, 2017  Follow Up Progress Note   JOELEE Forbes  [id number]  DOB: 12-Dec-1940   DOA: 06/27/2017  Referring Physician: Merton Border, MD   HPI: Becky Forbes is a 77 y.o. female seen for follow up of Acute on Chronic Respiratory Failure.  She is now on 4 L nasal cannula and seems to be tolerating it fairly well.  Patient's has good saturations.  Some desaturations still noted with activity   Review of Systems:   ROS performed and is unremarkable other than noted above.  Past Medical History:  Diagnosis Date  . Acute hypoxemic respiratory failure ([employer])    a. 05/2017 - ? ARDS from bromocriptine vs amiodarone -> discharged to [facility].  . Acute on chronic respiratory failure with hypoxia ([place])   . Anemia    hx  . Arthritis   . Carotid artery disease ([place])    a. Carotid duplex 07/2016: 1-39% bilaterally, f/u PRN recommended.  . Chronic atrial fibrillation ([place])   . Chronic kidney disease, stage 3 ([place])   . Chronic systolic CHF (congestive heart failure) ([place])    a. presumed NICM with varying EF over the years. No prior ischemic workup.  . Chronic systolic heart failure ([place])   . CKD (chronic kidney disease), stage III ([place])    no nephrologist   . Complication of anesthesia   . Coronary artery calcification seen on CT scan   . Diabetes mellitus    x 10 yrs  . GI bleed    a. 04/2012 - lower GIB - felt 2/2 mild ischemic colitis as shown on colonoscopy, was cleared for anticoag 1 week out from procedure.  Marland Kitchen Heart murmur    hx  . HTN (hypertension)   . Hyperlipemia   . Hypothyroidism   . Ischemic colitis ([place])    a. 04/2012 - lower GIB - felt 2/2 mild ischemic colitis as shown on colonoscopy..  . Persistent atrial fibrillation ([place])   . PONV (postoperative nausea and vomiting)   . 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 [place] DR  dual-chamber  pacemaker 03/2014 by Dr. Rayann Heman for sick sinus/sx bradycardia  . Renal insufficiency   . Sepsis [facility])     Past Surgical History:  Procedure Laterality Date  . ABDOMINAL HYSTERECTOMY    . APPENDECTOMY    . BACK SURGERY    . CARPAL TUNNEL RELEASE Right   . CATARACT EXTRACTION  05/25/2007   RIGHT EYE,left  . COLONOSCOPY  01/31/2009   small external/internal hemorrhoids, diverticulosis in sigmoid colon, one small polyp (biopsied). Biospy essentially normal. Dr.Magod  . COLONOSCOPY  04/30/2012   Procedure: COLONOSCOPY;  Surgeon: Cleotis Nipper, MD;  Location[facility] ENDOSCOPY;  Service: Endoscopy;  Laterality: N/A;  unprepped, poss flex only  . ESOPHAGOGASTRODUODENOSCOPY (EGD) WITH PROPOFOL Left 06/27/2017   Procedure: ESOPHAGOGASTRODUODENOSCOPY (EGD) WITH PROPOFOL;  Surgeon: Otis Brace, MD;  Location: [facility];  Service: Gastroenterology;  Laterality: Left;  . FOOT SURGERY Left 2006   MID FOOT RECONSTRUCTION  . GANGLION CYST EXCISION Right 12/2007   Dr.Gramig hand  . JOINT REPLACEMENT     bilateral  . LUMBAR LAMINECTOMY     x3 one fusion  . OSTEOTOMY Left 2006   AND FUSION  . PARS PLANA VITRECTOMY W/ REPAIR OF MACULAR HOLE Right 2010  . PERMANENT PACEMAKER INSERTION N/A 03/29/2014   STJ Assurity dual chamber paceamker implnated by  Dr Rayann Heman  . TOTAL KNEE ARTHROPLASTY Bilateral 05/02/08, 07/25/08   Dr.Alusio    Social History:    reports that she has never smoked. She has never used smokeless tobacco. She reports that she does not drink alcohol or use drugs.  Family History: Non-Contributory to the present illness  Allergies  Allergen Reactions  . Morphine And Related Itching, Nausea And Vomiting, Rash and Other (See Comments)    01/04/15- patient says she is NOT allergic to morphine.  Morphine IV given this AM 01/04/15 in the ED. No adverse reaction.  Celesta Gentile [Sitagliptin Phosphate] Other (See Comments)    Pt says this caused elevated liver enzymes and creatinine. IS  TAKING 1/2 TABLET  . Amiodarone     Possible pulmonary toxicity  . Bromocriptine     Possible contributor to pulmonary issues  . Actos [Pioglitazone Hydrochloride] Swelling    UNSPECIFIED REACTION   . Hydrocodone-Acetaminophen Itching, Nausea And Vomiting and Other (See Comments)    Per pt 05/12/15 she had this recently in the hospital with no issues at all  . Keflex [Cephalexin] Rash and Other (See Comments)    Burning sensation all over  . Percocet [Oxycodone-Acetaminophen] Nausea And Vomiting    Medications:  Reviewed on Rounds  Physical Exam:  Vitals: Temperature 98.0 pulse 90 respiratory rate 22 blood pressure 124/75 saturations 96%  Ventilator Settings currently off the ventilator on spontaneous breathing with 4 L nasal cannula  . General: Comfortable at this time . Eyes: Grossly normal lids, irises & conjunctiva . ENT: grossly tongue is normal . Neck: no obvious mass . Cardiovascular: S1-S2 normal no rubs noted . Respiratory: No rhonchi expansion is equal . Abdomen: Soft nontender . Skin: no rash seen on limited exam . Musculoskeletal: not rigid . Psychiatric:unable to assess . Neurologic: no seizure no involuntary movements          Labs on Admission:  Basic Metabolic Panel: Recent Labs  Lab 06/29/17 0821 06/30/17 0821 07/01/17 0556 07/02/17 0745 07/03/17 0505 07/04/17 0638  NA  --  143 141 143 143 146*  K  --  4.3 4.2 3.8 3.5 3.6  CL  --  113* 110 112* 108 108  CO2  --  21* 21* 24 25 25   GLUCOSE  --  126* 72 157* 146* 129*  BUN  --  49* 46* 41* 35* 34*  CREATININE  --  0.97 0.92 0.87 0.87 0.93  CALCIUM  --  7.4* 7.6* 7.8* 7.9* 8.2*  MG 1.8  --  1.6* 1.5* 1.7 1.7  PHOS  --   --  2.4* 2.4* 2.4* 2.3*    Liver Function Tests: Recent Labs  Lab 07/01/17 0556 07/02/17 0745 07/03/17 0505  ALBUMIN 1.4* 2.0* 2.3*   No results for input(s): LIPASE, AMYLASE in the last 168 hours. No results for input(s): AMMONIA in the last 168 hours.  CBC: Recent  Labs  Lab 06/28/17 0618  06/29/17 0829 07/01/17 0556 07/02/17 0745 07/03/17 0505 07/04/17 0638  WBC 12.0*  --   --  5.5 4.7 4.3 4.2  HGB 8.8*   < > 9.1* 9.0* 8.1* 7.7* 8.1*  HCT 27.0*   < > 28.6* 28.2* 25.2* 23.7* 26.0*  MCV 88.5  --   --  89.0 87.5 87.5 88.7  PLT 82*  --   --  73* 76* 70* 94*   < > = values in this interval not displayed.    Cardiac Enzymes: Recent Labs  Lab 06/28/17 0007 06/28/17 0618 06/28/17  1143 06/29/17 0821 06/30/17 0821 06/30/17 1324  CKTOTAL 162 122 111 64  --  61  CKMB 17.6* 14.7* 14.4* 8.7*  --  15.3*  TROPONINI 0.79* 0.75* 0.61* 0.64* 1.42* 1.51*    BNP (last 3 results) Recent Labs    06/02/17 1123 06/14/17 0552  BNP 2,340.8* 244.3*    ProBNP (last 3 results) No results for input(s): PROBNP in the last 8760 hours.   Radiological Exams on Admission: No results found.     Assessment/Plan Active Problems:   Acute on chronic respiratory failure with hypoxia (HCC)   Chronic systolic heart failure (HCC)   Chronic atrial fibrillation (HCC)   Chronic kidney disease, stage 3 ([place])   1. Acute on chronic respiratory failure with hypoxia at this time doing little bit better we are going to continue to try to wean the oxygen down as tolerated she had been on 3 L overnight increased to 4 L.  We will wean down again she is doing a little better. 2. Chronic systolic heart failure followed by cardiology echo results were noted already 3. Chronic atrial fibrillation rate is controlled we will continue to monitor closely 4. Chronic kidney disease stage III continue to follow the labs      I have personally seen and evaluated the patient, evaluated laboratory and imaging results, formulated the assessment and plan and placed orders. The Patient requires high complexity decision making for assessment and support.  Case was discussed on Rounds with the Respiratory Therapy Staff   Allyne Gee, MD [facility] Pulmonary Critical Care  Medicine Sleep Medicine

## 2017-07-05 LAB — BASIC METABOLIC PANEL
Anion gap: 12 (ref 5–15)
BUN: 34 mg/dL — ABNORMAL HIGH (ref 6–20)
CALCIUM: 8 mg/dL — AB (ref 8.9–10.3)
CHLORIDE: 106 mmol/L (ref 101–111)
CO2: 27 mmol/L (ref 22–32)
CREATININE: 0.91 mg/dL (ref 0.44–1.00)
GFR calc Af Amer: >60 mL/min (ref >60)
GFR calc non Af Amer: 59 mL/min — ABNORMAL LOW (ref >60)
Glucose, Bld: 119 mg/dL — ABNORMAL HIGH (ref 65–99)
Potassium: 3.2 mmol/L — ABNORMAL LOW (ref 3.5–5.1)
SODIUM: 145 mmol/L (ref 135–145)

## 2017-07-05 LAB — MAGNESIUM: MAGNESIUM: 1.9 mg/dL (ref 1.7–2.4)

## 2017-07-06 LAB — RENAL FUNCTION PANEL
ALBUMIN: 2.2 g/dL — AB (ref 3.5–5.0)
Anion gap: 8 (ref 5–15)
BUN: 32 mg/dL — AB (ref 6–20)
CHLORIDE: 111 mmol/L (ref 101–111)
CO2: 30 mmol/L (ref 22–32)
Calcium: 8.1 mg/dL — ABNORMAL LOW (ref 8.9–10.3)
Creatinine, Ser: 0.92 mg/dL (ref 0.44–1.00)
GFR calc Af Amer: >60 mL/min (ref >60)
GFR calc non Af Amer: 59 mL/min — ABNORMAL LOW (ref >60)
GLUCOSE: 99 mg/dL (ref 65–99)
POTASSIUM: 3.7 mmol/L (ref 3.5–5.1)
Phosphorus: 2.3 mg/dL — ABNORMAL LOW (ref 2.5–4.6)
Sodium: 149 mmol/L — ABNORMAL HIGH (ref 135–145)

## 2017-07-06 LAB — CBC
HEMATOCRIT: 25.6 % — AB (ref 36.0–46.0)
Hemoglobin: 8 g/dL — ABNORMAL LOW (ref 12.0–15.0)
MCH: 28.4 pg (ref 26.0–34.0)
MCHC: 31.3 g/dL (ref 30.0–36.0)
MCV: 90.8 fL (ref 78.0–100.0)
Platelets: 108 10*3/uL — ABNORMAL LOW (ref 150–400)
RBC: 2.82 MIL/uL — ABNORMAL LOW (ref 3.87–5.11)
RDW: 19.1 % — AB (ref 11.5–15.5)
WBC: 4.3 10*3/uL (ref 4.0–10.5)

## 2017-07-06 LAB — MAGNESIUM: Magnesium: 1.7 mg/dL (ref 1.7–2.4)

## 2017-07-07 ENCOUNTER — Other Ambulatory Visit (HOSPITAL_COMMUNITY): Payer: Self-pay

## 2017-07-07 DIAGNOSIS — K449 Diaphragmatic hernia without obstruction or gangrene: Secondary | ICD-10-CM | POA: Diagnosis not present

## 2017-07-07 LAB — RENAL FUNCTION PANEL
ALBUMIN: 2.1 g/dL — AB (ref 3.5–5.0)
Anion gap: 9 (ref 5–15)
BUN: 26 mg/dL — AB (ref 6–20)
CHLORIDE: 107 mmol/L (ref 101–111)
CO2: 30 mmol/L (ref 22–32)
CREATININE: 0.85 mg/dL (ref 0.44–1.00)
Calcium: 7.9 mg/dL — ABNORMAL LOW (ref 8.9–10.3)
GFR calc Af Amer: >60 mL/min (ref >60)
Glucose, Bld: 94 mg/dL (ref 65–99)
Phosphorus: 2.6 mg/dL (ref 2.5–4.6)
Potassium: 3.3 mmol/L — ABNORMAL LOW (ref 3.5–5.1)
SODIUM: 146 mmol/L — AB (ref 135–145)

## 2017-07-07 LAB — COMPREHENSIVE METABOLIC PANEL
ALT: 41 U/L (ref 14–54)
AST: 24 U/L (ref 15–41)
Albumin: 2 g/dL — ABNORMAL LOW (ref 3.5–5.0)
Alkaline Phosphatase: 193 U/L — ABNORMAL HIGH (ref 38–126)
Anion gap: 10 (ref 5–15)
BUN: 33 mg/dL — ABNORMAL HIGH (ref 6–20)
CHLORIDE: 106 mmol/L (ref 101–111)
CO2: 28 mmol/L (ref 22–32)
CREATININE: 1 mg/dL (ref 0.44–1.00)
Calcium: 7.7 mg/dL — ABNORMAL LOW (ref 8.9–10.3)
GFR, EST NON AFRICAN AMERICAN: 53 mL/min — AB (ref >60)
Glucose, Bld: 172 mg/dL — ABNORMAL HIGH (ref 65–99)
Potassium: 4.1 mmol/L (ref 3.5–5.1)
Sodium: 144 mmol/L (ref 135–145)
Total Bilirubin: 1.4 mg/dL — ABNORMAL HIGH (ref 0.3–1.2)
Total Protein: 5 g/dL — ABNORMAL LOW (ref 6.5–8.1)

## 2017-07-07 LAB — CBC
HCT: 27.6 % — ABNORMAL LOW (ref 36.0–46.0)
Hemoglobin: 8.4 g/dL — ABNORMAL LOW (ref 12.0–15.0)
MCH: 27.5 pg (ref 26.0–34.0)
MCHC: 30.4 g/dL (ref 30.0–36.0)
MCV: 90.5 fL (ref 78.0–100.0)
PLATELETS: 108 10*3/uL — AB (ref 150–400)
RBC: 3.05 MIL/uL — ABNORMAL LOW (ref 3.87–5.11)
RDW: 19.8 % — AB (ref 11.5–15.5)
WBC: 6 10*3/uL (ref 4.0–10.5)

## 2017-07-07 LAB — MAGNESIUM: MAGNESIUM: 1.8 mg/dL (ref 1.7–2.4)

## 2017-07-07 NOTE — Progress Notes (Signed)
Pulmonary [place]   PULMONARY SERVICE  PROGRESS NOTE  Date of Service:  July 07, 2017  Follow Up Progress Note   Becky Forbes  [id number]  DOB: [birth year]   DOA: 06/27/2017  Referring Physician: Merton Border, MD   HPI: Becky Forbes is a 77 y.o. female seen for follow up of Acute on Chronic Respiratory Failure.  Patient is doing fairly well patient is been on 3 L now.  She still is on the Oxymizer with going to try to change her over to a nasal cannula    Medications:  Reviewed on Rounds  Physical Exam:  Vitals:  Temperature 96.7 degrees pulse 91 respiratory 18 blood pressure 134/75 saturations 99%  Ventilator Settings  Currently off the vent 3 L Oxymizer  . General: Comfortable at this time . Eyes: Grossly normal lids, irises & conjunctiva . ENT: grossly tongue is normal . Neck: no obvious mass . Cardiovascular:  S1-S2 normal no gallop . Respiratory:  No rhonchi expansion equal . Abdomen:  Soft nontender . Skin: no rash seen on limited exam . Musculoskeletal: not rigid . Psychiatric:unable to assess . Neurologic: no seizure no involuntary movements          Labs on Admission:  Basic Metabolic Panel: Recent Labs  Lab 07/02/17 0745 07/03/17 0505 07/04/17 0638 07/05/17 0530 07/06/17 0740 07/07/17 0759  NA 143 143 146* 145 149* 146*  K 3.8 3.5 3.6 3.2* 3.7 3.3*  CL 112* 108 108 106 111 107  CO2 24 25 25 27 30 30   GLUCOSE 157* 146* 129* 119* 99 94  BUN 41* 35* 34* 34* 32* 26*  CREATININE 0.87 0.87 0.93 0.91 0.92 0.85  CALCIUM 7.8* 7.9* 8.2* 8.0* 8.1* 7.9*  MG 1.5* 1.7 1.7 1.9 1.7 1.8  PHOS 2.4* 2.4* 2.3*  --  2.3* 2.6    Liver Function Tests: Recent Labs  Lab 07/01/17 0556 07/02/17 0745 07/03/17 0505 07/06/17 0740 07/07/17 0759  ALBUMIN 1.4* 2.0* 2.3* 2.2* 2.1*   No results for input(s): LIPASE, AMYLASE in the last 168 hours. No results for input(s): AMMONIA in the last 168 hours.  CBC: Recent  Labs  Lab 07/02/17 0745 07/03/17 0505 07/04/17 9833 07/06/17 0740 07/07/17 0759  WBC 4.7 4.3 4.2 4.3 6.0  HGB 8.1* 7.7* 8.1* 8.0* 8.4*  HCT 25.2* 23.7* 26.0* 25.6* 27.6*  MCV 87.5 87.5 88.7 90.8 90.5  PLT 76* 70* 94* 108* 108*    Cardiac Enzymes: No results for input(s): CKTOTAL, CKMB, CKMBINDEX, TROPONINI in the last 168 hours.  BNP (last 3 results) Recent Labs    06/02/17 1123 06/14/17 0552  BNP 2,340.8* 244.3*    ProBNP (last 3 results) No results for input(s): PROBNP in the last 8760 hours.   Radiological Exams on Admission: No results found.     Assessment/Plan Active Problems:   Acute on chronic respiratory failure with hypoxia (HCC)   Chronic systolic heart failure (HCC)   Chronic atrial fibrillation (HCC)   Chronic kidney disease, stage 3 (HCC)   1.  acute on chronic Respiratory failure with hypoxia patient is doing fairly well weaning oxygen gradually will continue with supportive care  2. Chronic atrial fibrillation rate is better controlled will continue with present management 3. Chronic systolic heart failure cardiology notes appreciated continue to monitor 4. Chronic kidney disease stage 3 followed labs      I have personally seen and evaluated the patient, evaluated laboratory and imaging results, formulated the assessment and  plan and placed orders. The Patient requires high complexity decision making for assessment and support.  Case was discussed on Rounds with the Respiratory Therapy Staff   Allyne Gee, MD [facility] Pulmonary Critical Care Medicine Sleep Medicine

## 2017-07-08 LAB — BASIC METABOLIC PANEL
ANION GAP: 11 (ref 5–15)
BUN: 41 mg/dL — ABNORMAL HIGH (ref 6–20)
CHLORIDE: 104 mmol/L (ref 101–111)
CO2: 28 mmol/L (ref 22–32)
Calcium: 7.8 mg/dL — ABNORMAL LOW (ref 8.9–10.3)
Creatinine, Ser: 1.37 mg/dL — ABNORMAL HIGH (ref 0.44–1.00)
GFR calc Af Amer: 42 mL/min — ABNORMAL LOW (ref >60)
GFR, EST NON AFRICAN AMERICAN: 36 mL/min — AB (ref >60)
GLUCOSE: 229 mg/dL — AB (ref 65–99)
POTASSIUM: 4.9 mmol/L (ref 3.5–5.1)
Sodium: 143 mmol/L (ref 135–145)

## 2017-07-08 NOTE — Progress Notes (Signed)
Patient ID: Becky Forbes, female   DOB: 1940/04/26, 77 y.o.   MRN: [id number]   Request for percutaneous gastric/jejunal tube placement  CT has been reviewed with Dr Kathlene Cote: Half of stomach is in chest cavity. This makes pt a poor candidate for percutaneous procedure. Rec: surgical consult  Will report to RN/MD

## 2017-07-09 ENCOUNTER — Other Ambulatory Visit (HOSPITAL_COMMUNITY): Payer: Self-pay

## 2017-07-09 DIAGNOSIS — R0603 Acute respiratory distress: Secondary | ICD-10-CM | POA: Diagnosis not present

## 2017-07-09 LAB — LIPID PANEL
CHOLESTEROL: 58 mg/dL (ref 0–200)
HDL: 13 mg/dL — ABNORMAL LOW (ref >40)
LDL Cholesterol: 19 mg/dL (ref 0–99)
Total CHOL/HDL Ratio: 4.5 RATIO
Triglycerides: 131 mg/dL (ref <150)
VLDL: 26 mg/dL (ref 0–40)

## 2017-07-09 LAB — BASIC METABOLIC PANEL
Anion gap: 12 (ref 5–15)
BUN: 49 mg/dL — AB (ref 6–20)
CHLORIDE: 105 mmol/L (ref 101–111)
CO2: 25 mmol/L (ref 22–32)
Calcium: 7.8 mg/dL — ABNORMAL LOW (ref 8.9–10.3)
Creatinine, Ser: 1.26 mg/dL — ABNORMAL HIGH (ref 0.44–1.00)
GFR calc non Af Amer: 40 mL/min — ABNORMAL LOW (ref >60)
GFR, EST AFRICAN AMERICAN: 46 mL/min — AB (ref >60)
Glucose, Bld: 149 mg/dL — ABNORMAL HIGH (ref 65–99)
Potassium: 4.2 mmol/L (ref 3.5–5.1)
SODIUM: 142 mmol/L (ref 135–145)

## 2017-07-09 LAB — CBC
HCT: 26.7 % — ABNORMAL LOW (ref 36.0–46.0)
Hemoglobin: 8.3 g/dL — ABNORMAL LOW (ref 12.0–15.0)
MCH: 28.6 pg (ref 26.0–34.0)
MCHC: 31.1 g/dL (ref 30.0–36.0)
MCV: 92.1 fL (ref 78.0–100.0)
Platelets: 134 10*3/uL — ABNORMAL LOW (ref 150–400)
RBC: 2.9 MIL/uL — ABNORMAL LOW (ref 3.87–5.11)
RDW: 21.2 % — ABNORMAL HIGH (ref 11.5–15.5)
WBC: 8.4 10*3/uL (ref 4.0–10.5)

## 2017-07-09 LAB — MAGNESIUM: Magnesium: 1.9 mg/dL (ref 1.7–2.4)

## 2017-07-09 NOTE — Progress Notes (Signed)
Pulmonary [place]   PULMONARY SERVICE  PROGRESS NOTE  Date of Service:  July 09, 2017  Becky WALDREN  QIH:[id number]  DOB: 1940-06-03   DOA: 06/27/2017  Referring Physician: Merton Border, MD  HPI: Becky Forbes is a 77 y.o. female seen for follow up of Acute on Chronic Respiratory Failure.  She is on 3 L nasal cannula has been having some desaturations with activity.  Patient was evaluated for a PEG placement however is deemed as not a candidate at this time  Medications: Reviewed on Rounds  Physical Exam:  Vitals:  Temperature 98.6 degrees pulse 77 respiratory 19 blood pressure 117/72 saturations 100%  Ventilator Settings  Off the ventilator on spontaneous breathing with 3 L nasal cannula  . General: Comfortable at this time . Eyes: Grossly normal lids, irises & conjunctiva . ENT: grossly tongue is normal . Neck: no obvious mass . Cardiovascular:  S1-S2 normal no gallop or rub . Respiratory:  Scattered distant rhonchi . Abdomen:  Soft nontender . Skin: no rash seen on limited exam . Musculoskeletal: not rigid . Psychiatric:unable to assess . Neurologic: no seizure no involuntary movements         Labs on Admission:  Basic Metabolic Panel: Recent Labs  Lab 07/03/17 0505 07/04/17 8756 07/05/17 0530 07/06/17 0740 07/07/17 0759 07/07/17 1609 07/08/17 0737 07/09/17 0743  NA 143 146* 145 149* 146* 144 143 142  K 3.5 3.6 3.2* 3.7 3.3* 4.1 4.9 4.2  CL 108 108 106 111 107 106 104 105  CO2 25 25 27 30 30 28 28 25   GLUCOSE 146* 129* 119* 99 94 172* 229* 149*  BUN 35* 34* 34* 32* 26* 33* 41* 49*  CREATININE 0.87 0.93 0.91 0.92 0.85 1.00 1.37* 1.26*  CALCIUM 7.9* 8.2* 8.0* 8.1* 7.9* 7.7* 7.8* 7.8*  MG 1.7 1.7 1.9 1.7 1.8  --   --  1.9  PHOS 2.4* 2.3*  --  2.3* 2.6  --   --   --     Liver Function Tests: Recent Labs  Lab 07/03/17 0505 07/06/17 0740 07/07/17 0759 07/07/17 1609  AST  --   --   --  24  ALT  --   --   --   41  ALKPHOS  --   --   --  193*  BILITOT  --   --   --  1.4*  PROT  --   --   --  5.0*  ALBUMIN 2.3* 2.2* 2.1* 2.0*   No results for input(s): LIPASE, AMYLASE in the last 168 hours. No results for input(s): AMMONIA in the last 168 hours.  CBC: Recent Labs  Lab 07/03/17 0505 07/04/17 4332 07/06/17 0740 07/07/17 0759 07/09/17 0743  WBC 4.3 4.2 4.3 6.0 8.4  HGB 7.7* 8.1* 8.0* 8.4* 8.3*  HCT 23.7* 26.0* 25.6* 27.6* 26.7*  MCV 87.5 88.7 90.8 90.5 92.1  PLT 70* 94* 108* 108* 134*    Cardiac Enzymes: No results for input(s): CKTOTAL, CKMB, CKMBINDEX, TROPONINI in the last 168 hours.  BNP (last 3 results) Recent Labs    06/02/17 1123 06/14/17 0552  BNP 2,340.8* 244.3*    ProBNP (last 3 results) No results for input(s): PROBNP in the last 8760 hours.  Radiological Exams on Admission: Ct Abdomen Wo Contrast  Result Date: 07/07/2017 CLINICAL DATA:  77 year old female with a history of dysphagia and possible gastrostomy EXAM: CT ABDOMEN WITHOUT CONTRAST TECHNIQUE: Multidetector CT imaging of the abdomen was  performed following the standard protocol without IV contrast. COMPARISON:  Chest CT 06/06/2017 FINDINGS: Lower chest: Dense calcifications of the native coronary vasculature. Partial imaging of cardiac pacing leads. Hiatal hernia. Mixed ground-glass and nodular opacities of the bilateral dependent lung bases, worst on the left. Small left pleural effusion. Hepatobiliary: Unremarkable appearance of the liver parenchyma. Dense material layer dependently within the gallbladder with calcified stones. Pancreas: Atrophic pancreatic tissue Spleen: Unremarkable spleen Adrenals/Urinary Tract: Unremarkable adrenal glands. Unremarkable appearance of the right kidney without hydronephrosis or nephrolithiasis. Unremarkable appearance of the left kidney with no hydronephrosis or nephrolithiasis. Stomach/Bowel: Hiatal hernia. Small amount of retained enteric contrast within the stomach lumen.  Unremarkable small bowel without abnormal distention. No transition point. No focal inflammatory changes of the visualized small bowel. Enteric contrast within the colon.  No abnormal distention. Vascular/Lymphatic: Mild atherosclerotic changes of the visualized abdominal aorta. No aneurysm Other: Edema of the abdominal wall. Musculoskeletal: Surgical changes of prior lumbar fixation, incompletely imaged. No displaced fracture identified. IMPRESSION: Mixed nodular and ground-glass opacities of the bilateral lower lungs, similar in appearance to the CT dated 06/06/2017. Differential again includes ARDS, multifocal pneumonia, edema, sequela of aspiration, and/or interstitial pneumonitis. Atherosclerosis and evidence of coronary artery disease. Aortic Atherosclerosis (ICD10-I70.0). Dense material layered within the gallbladder, likely stones/microlithiasis. No evidence of acute inflammatory changes. Hiatal hernia Electronically Signed   By: Corrie Mckusick D.O.   On: 07/07/2017 16:35   Dg Chest Port 1 View  Result Date: 07/09/2017 CLINICAL DATA:  Respiratory distress EXAM: PORTABLE CHEST 1 VIEW COMPARISON:  06/30/2017 FINDINGS: Cardiac enlargement.  Dual lead pacemaker unchanged. Extensive diffuse bilateral airspace disease unchanged. No significant effusion. IMPRESSION: No significant interval change diffuse bilateral airspace disease. Possible edema or pneumonia. Underlying chronic lung disease also present. Electronically Signed   By: Franchot Gallo M.D.   On: 07/09/2017 11:19    Assessment/Plan Active Problems:   Acute on chronic respiratory failure with hypoxia (HCC)   Chronic systolic heart failure (HCC)   Chronic atrial fibrillation (HCC)   Chronic kidney disease, stage 3 (HCC)   1.  acute on chronic Respiratory failure with hypoxia at this time patient is going to continue with oxygen therapy as ordered currently is on 3 L nasal cannula with good tolerance will continue to monitor  closely 2. Chronic systolic heart failure we will continue with supportive care monitor fluid status very closely 3. Chronic atrial fibrillation rate is controlled at this time 4. Chronic kidney disease stage 3 creatinine is improving it was actually down to 1.26   I have personally seen and evaluated the patient, evaluated laboratory and imaging results, formulated the assessment and plan and placed orders. The Patient requires high complexity decision making for assessment and support.  Case was discussed on Rounds with the Respiratory Therapy Staff  Allyne Gee, MD [facility] Pulmonary Critical Care Medicine Sleep Medicine

## 2017-07-10 NOTE — Progress Notes (Signed)
Pulmonary [place]   PULMONARY SERVICE  PROGRESS NOTE   Date of Service:  July 10, 2017  Becky Forbes  ZOX:[id number]  DOB: [birth year]   DOA: 06/27/2017  Referring Physician: Merton Border, MD  HPI: Becky Forbes is a 77 y.o. female seen for follow up of Acute on Chronic Respiratory Failure.  She is doing fairly well determined that the PEG candidate she is right now on 3 L nasal cannula  Medications: Reviewed on Rounds  Physical Exam:  Vitals:  Temperature 96.8 degrees pulse 87 respiratory 17 blood pressure 110/59 saturations 99%  Ventilator Settings  Currently off the ventilator on 3 L nasal cannula  . General: Comfortable at this time . Eyes: Grossly normal lids, irises & conjunctiva . ENT: grossly tongue is normal . Neck: no obvious mass . Cardiovascular:  S1-S2 normal no gallop . Respiratory:  No rhonchi or rales . Abdomen:  Soft nontender . Skin: no rash seen on limited exam . Musculoskeletal: not rigid . Psychiatric:unable to assess . Neurologic: no seizure no involuntary movements         Labs on Admission:  Basic Metabolic Panel: Recent Labs  Lab 07/04/17 0638 07/05/17 0530 07/06/17 0740 07/07/17 0759 07/07/17 1609 07/08/17 0737 07/09/17 0743  NA 146* 145 149* 146* 144 143 142  K 3.6 3.2* 3.7 3.3* 4.1 4.9 4.2  CL 108 106 111 107 106 104 105  CO2 25 27 30 30 28 28 25   GLUCOSE 129* 119* 99 94 172* 229* 149*  BUN 34* 34* 32* 26* 33* 41* 49*  CREATININE 0.93 0.91 0.92 0.85 1.00 1.37* 1.26*  CALCIUM 8.2* 8.0* 8.1* 7.9* 7.7* 7.8* 7.8*  MG 1.7 1.9 1.7 1.8  --   --  1.9  PHOS 2.3*  --  2.3* 2.6  --   --   --     Liver Function Tests: Recent Labs  Lab 07/06/17 0740 07/07/17 0759 07/07/17 1609  AST  --   --  24  ALT  --   --  41  ALKPHOS  --   --  193*  BILITOT  --   --  1.4*  PROT  --   --  5.0*  ALBUMIN 2.2* 2.1* 2.0*   No results for input(s): LIPASE, AMYLASE in the last 168 hours. No results for  input(s): AMMONIA in the last 168 hours.  CBC: Recent Labs  Lab 07/04/17 0638 07/06/17 0740 07/07/17 0759 07/09/17 0743  WBC 4.2 4.3 6.0 8.4  HGB 8.1* 8.0* 8.4* 8.3*  HCT 26.0* 25.6* 27.6* 26.7*  MCV 88.7 90.8 90.5 92.1  PLT 94* 108* 108* 134*    Cardiac Enzymes: No results for input(s): CKTOTAL, CKMB, CKMBINDEX, TROPONINI in the last 168 hours.  BNP (last 3 results) Recent Labs    06/02/17 1123 06/14/17 0552  BNP 2,340.8* 244.3*    ProBNP (last 3 results) No results for input(s): PROBNP in the last 8760 hours.  Radiological Exams on Admission: Ct Abdomen Wo Contrast  Result Date: 07/07/2017 CLINICAL DATA:  77 year old female with a history of dysphagia and possible gastrostomy EXAM: CT ABDOMEN WITHOUT CONTRAST TECHNIQUE: Multidetector CT imaging of the abdomen was performed following the standard protocol without IV contrast. COMPARISON:  Chest CT 06/06/2017 FINDINGS: Lower chest: Dense calcifications of the native coronary vasculature. Partial imaging of cardiac pacing leads. Hiatal hernia. Mixed ground-glass and nodular opacities of the bilateral dependent lung bases, worst on the left. Small left pleural effusion. Hepatobiliary: Unremarkable  appearance of the liver parenchyma. Dense material layer dependently within the gallbladder with calcified stones. Pancreas: Atrophic pancreatic tissue Spleen: Unremarkable spleen Adrenals/Urinary Tract: Unremarkable adrenal glands. Unremarkable appearance of the right kidney without hydronephrosis or nephrolithiasis. Unremarkable appearance of the left kidney with no hydronephrosis or nephrolithiasis. Stomach/Bowel: Hiatal hernia. Small amount of retained enteric contrast within the stomach lumen. Unremarkable small bowel without abnormal distention. No transition point. No focal inflammatory changes of the visualized small bowel. Enteric contrast within the colon.  No abnormal distention. Vascular/Lymphatic: Mild atherosclerotic changes of  the visualized abdominal aorta. No aneurysm Other: Edema of the abdominal wall. Musculoskeletal: Surgical changes of prior lumbar fixation, incompletely imaged. No displaced fracture identified. IMPRESSION: Mixed nodular and ground-glass opacities of the bilateral lower lungs, similar in appearance to the CT dated 06/06/2017. Differential again includes ARDS, multifocal pneumonia, edema, sequela of aspiration, and/or interstitial pneumonitis. Atherosclerosis and evidence of coronary artery disease. Aortic Atherosclerosis (ICD10-I70.0). Dense material layered within the gallbladder, likely stones/microlithiasis. No evidence of acute inflammatory changes. Hiatal hernia Electronically Signed   By: Corrie Mckusick D.O.   On: 07/07/2017 16:35   Dg Chest Port 1 View  Result Date: 07/09/2017 CLINICAL DATA:  Respiratory distress EXAM: PORTABLE CHEST 1 VIEW COMPARISON:  06/30/2017 FINDINGS: Cardiac enlargement.  Dual lead pacemaker unchanged. Extensive diffuse bilateral airspace disease unchanged. No significant effusion. IMPRESSION: No significant interval change diffuse bilateral airspace disease. Possible edema or pneumonia. Underlying chronic lung disease also present. Electronically Signed   By: Franchot Gallo M.D.   On: 07/09/2017 11:19    Assessment/Plan Active Problems:   Acute on chronic respiratory failure with hypoxia (HCC)   Chronic systolic heart failure (HCC)   Chronic atrial fibrillation (HCC)   Chronic kidney disease, stage 3 (HCC)   1.  acute on chronic Respiratory failure hypoxia at this time we will continue oxygen therapy good saturations are noted she seems to be doing bit better  2. chronic systolic heart failure we will continue with diuresis as tolerated 3. Chronic atrial fibrillation rate is controlled 4. Chronic kidney disease stage 3 continue to monitor the labs   I have personally seen and evaluated the patient, evaluated laboratory and imaging results, formulated the assessment  and plan and placed orders. The Patient requires high complexity decision making for assessment and support.  Case was discussed on Rounds with the Respiratory Therapy Staff  Allyne Gee, MD [facility] Pulmonary Critical Care Medicine Sleep Medicine

## 2017-07-11 LAB — LIPID PANEL
CHOL/HDL RATIO: 4.8 ratio
Cholesterol: 57 mg/dL (ref 0–200)
HDL: 12 mg/dL — ABNORMAL LOW (ref >40)
LDL CALC: 16 mg/dL (ref 0–99)
Triglycerides: 147 mg/dL (ref <150)
VLDL: 29 mg/dL (ref 0–40)

## 2017-07-12 LAB — BASIC METABOLIC PANEL
ANION GAP: 9 (ref 5–15)
BUN: 56 mg/dL — ABNORMAL HIGH (ref 6–20)
CHLORIDE: 102 mmol/L (ref 101–111)
CO2: 27 mmol/L (ref 22–32)
Calcium: 7.7 mg/dL — ABNORMAL LOW (ref 8.9–10.3)
Creatinine, Ser: 1.04 mg/dL — ABNORMAL HIGH (ref 0.44–1.00)
GFR calc non Af Amer: 50 mL/min — ABNORMAL LOW (ref >60)
GFR, EST AFRICAN AMERICAN: 59 mL/min — AB (ref >60)
GLUCOSE: 156 mg/dL — AB (ref 65–99)
Potassium: 4.3 mmol/L (ref 3.5–5.1)
Sodium: 138 mmol/L (ref 135–145)

## 2017-07-12 LAB — CBC
HCT: 25.6 % — ABNORMAL LOW (ref 36.0–46.0)
HEMOGLOBIN: 8 g/dL — AB (ref 12.0–15.0)
MCH: 29.1 pg (ref 26.0–34.0)
MCHC: 31.3 g/dL (ref 30.0–36.0)
MCV: 93.1 fL (ref 78.0–100.0)
Platelets: 109 10*3/uL — ABNORMAL LOW (ref 150–400)
RBC: 2.75 MIL/uL — AB (ref 3.87–5.11)
RDW: 23.4 % — ABNORMAL HIGH (ref 11.5–15.5)
WBC: 7.6 10*3/uL (ref 4.0–10.5)

## 2017-07-13 LAB — POTASSIUM: POTASSIUM: 3.8 mmol/L (ref 3.5–5.1)

## 2017-07-14 LAB — LIPID PANEL
CHOL/HDL RATIO: 5 ratio
CHOLESTEROL: 80 mg/dL (ref 0–200)
HDL: 16 mg/dL — ABNORMAL LOW (ref >40)
LDL Cholesterol: 33 mg/dL (ref 0–99)
TRIGLYCERIDES: 154 mg/dL — AB (ref <150)
VLDL: 31 mg/dL (ref 0–40)

## 2017-07-15 LAB — CBC
HEMATOCRIT: 27.8 % — AB (ref 36.0–46.0)
Hemoglobin: 8.6 g/dL — ABNORMAL LOW (ref 12.0–15.0)
MCH: 29.2 pg (ref 26.0–34.0)
MCHC: 30.9 g/dL (ref 30.0–36.0)
MCV: 94.2 fL (ref 78.0–100.0)
PLATELETS: 135 10*3/uL — AB (ref 150–400)
RBC: 2.95 MIL/uL — ABNORMAL LOW (ref 3.87–5.11)
RDW: 24.8 % — ABNORMAL HIGH (ref 11.5–15.5)
WBC: 10.2 10*3/uL (ref 4.0–10.5)

## 2017-07-15 LAB — RENAL FUNCTION PANEL
ALBUMIN: 1.9 g/dL — AB (ref 3.5–5.0)
Anion gap: 10 (ref 5–15)
BUN: 49 mg/dL — AB (ref 6–20)
CHLORIDE: 101 mmol/L (ref 101–111)
CO2: 28 mmol/L (ref 22–32)
CREATININE: 0.83 mg/dL (ref 0.44–1.00)
Calcium: 8 mg/dL — ABNORMAL LOW (ref 8.9–10.3)
GFR calc Af Amer: >60 mL/min (ref >60)
GFR calc non Af Amer: >60 mL/min (ref >60)
GLUCOSE: 148 mg/dL — AB (ref 65–99)
PHOSPHORUS: 2.8 mg/dL (ref 2.5–4.6)
POTASSIUM: 3.8 mmol/L (ref 3.5–5.1)
Sodium: 139 mmol/L (ref 135–145)

## 2017-07-15 LAB — MAGNESIUM: Magnesium: 1.9 mg/dL (ref 1.7–2.4)

## 2017-07-16 LAB — LIPID PANEL
CHOL/HDL RATIO: 4.3 ratio
CHOLESTEROL: 73 mg/dL (ref 0–200)
HDL: 17 mg/dL — AB (ref >40)
LDL Cholesterol: 31 mg/dL (ref 0–99)
Triglycerides: 123 mg/dL (ref <150)
VLDL: 25 mg/dL (ref 0–40)

## 2017-07-17 ENCOUNTER — Encounter: Payer: Medicare Other | Admitting: *Deleted

## 2017-07-17 ENCOUNTER — Telehealth: Payer: Self-pay | Admitting: Cardiology

## 2017-07-17 NOTE — Progress Notes (Signed)
Pulmonary [place]   PULMONARY SERVICE  PROGRESS NOTE  Date of Service: July 17, 2017  Becky Forbes  DJM:[id number]  DOB: 03/21/1941   DOA: 06/27/2017  Referring Physician: Merton Border, MD  HPI: Becky Forbes is a 77 y.o. female seen for follow up of Acute on Chronic Respiratory Failure.  She is resting comfortably we have been weaning her oxygen down she is been on anywhere from 3 up to 4 L with good saturations.  Medications: Reviewed on Rounds  Physical Exam:  Vitals: Temperature 97.4 pulse 81 respiratory rate 28 blood pressure 116/70 saturation is 100%  Ventilator Settings off the ventilator  . General: Comfortable at this time . Eyes: Grossly normal lids, irises & conjunctiva . ENT: grossly tongue is normal . Neck: no obvious mass . Cardiovascular: S1-S2 normal no gallop . Respiratory: No rhonchi few scattered rales . Abdomen: Soft and nontender . Skin: no rash seen on limited exam . Musculoskeletal: not rigid . Psychiatric:unable to assess . Neurologic: no seizure no involuntary movements         Labs on Admission:  Basic Metabolic Panel: Recent Labs  Lab 07/12/17 0505 07/13/17 0507 07/15/17 0644  NA 138  --  139  K 4.3 3.8 3.8  CL 102  --  101  CO2 27  --  28  GLUCOSE 156*  --  148*  BUN 56*  --  49*  CREATININE 1.04*  --  0.83  CALCIUM 7.7*  --  8.0*  MG  --   --  1.9  PHOS  --   --  2.8    Liver Function Tests: Recent Labs  Lab 07/15/17 0644  ALBUMIN 1.9*   No results for input(s): LIPASE, AMYLASE in the last 168 hours. No results for input(s): AMMONIA in the last 168 hours.  CBC: Recent Labs  Lab 07/12/17 0505 07/15/17 0644  WBC 7.6 10.2  HGB 8.0* 8.6*  HCT 25.6* 27.8*  MCV 93.1 94.2  PLT 109* 135*    Cardiac Enzymes: No results for input(s): CKTOTAL, CKMB, CKMBINDEX, TROPONINI in the last 168 hours.  BNP (last 3 results) Recent Labs    06/02/17 1123 06/14/17 0552  BNP 2,340.8*  244.3*    ProBNP (last 3 results) No results for input(s): PROBNP in the last 8760 hours.  Radiological Exams on Admission: No results found.  Assessment/Plan Active Problems:   Acute on chronic respiratory failure with hypoxia (HCC)   Chronic systolic heart failure (HCC)   Chronic atrial fibrillation (HCC)   Chronic kidney disease, stage 3 ([place])   1. Acute on chronic respiratory failure with hypoxia we will continue with supportive care titrate oxygen down as tolerated 2. Chronic systolic heart failure monitor fluid status diuresis as tolerated 3. Chronic labs 4. Chronic atrial fibrillation rate is controlled at this time   I have personally seen and evaluated the patient, evaluated laboratory and imaging results, formulated the assessment and plan and placed orders. The Patient requires high complexity decision making for assessment and support.  Case was discussed on Rounds with the Respiratory Therapy Staff  Allyne Gee, MD [facility] Pulmonary Critical Care Medicine Sleep Medicine

## 2017-07-17 NOTE — Telephone Encounter (Signed)
Attempted to confirm remote transmission with pt. No answer and was unable to leave a message.   

## 2017-07-18 LAB — LIPID PANEL
Cholesterol: 75 mg/dL (ref 0–200)
HDL: 19 mg/dL — ABNORMAL LOW (ref >40)
LDL CALC: 31 mg/dL (ref 0–99)
Total CHOL/HDL Ratio: 3.9 RATIO
Triglycerides: 125 mg/dL (ref <150)
VLDL: 25 mg/dL (ref 0–40)

## 2017-07-20 LAB — CBC
HCT: 26.2 % — ABNORMAL LOW (ref 36.0–46.0)
Hemoglobin: 8.1 g/dL — ABNORMAL LOW (ref 12.0–15.0)
MCH: 29.7 pg (ref 26.0–34.0)
MCHC: 30.9 g/dL (ref 30.0–36.0)
MCV: 96 fL (ref 78.0–100.0)
PLATELETS: 176 10*3/uL (ref 150–400)
RBC: 2.73 MIL/uL — AB (ref 3.87–5.11)
RDW: 26.4 % — ABNORMAL HIGH (ref 11.5–15.5)
WBC: 10.9 10*3/uL — AB (ref 4.0–10.5)

## 2017-07-20 LAB — RENAL FUNCTION PANEL
ALBUMIN: 1.7 g/dL — AB (ref 3.5–5.0)
Anion gap: 7 (ref 5–15)
BUN: 40 mg/dL — AB (ref 6–20)
CO2: 30 mmol/L (ref 22–32)
CREATININE: 0.68 mg/dL (ref 0.44–1.00)
Calcium: 7.9 mg/dL — ABNORMAL LOW (ref 8.9–10.3)
Chloride: 104 mmol/L (ref 101–111)
GFR calc Af Amer: >60 mL/min (ref >60)
Glucose, Bld: 155 mg/dL — ABNORMAL HIGH (ref 65–99)
PHOSPHORUS: 2.4 mg/dL — AB (ref 2.5–4.6)
Potassium: 3 mmol/L — ABNORMAL LOW (ref 3.5–5.1)
Sodium: 141 mmol/L (ref 135–145)

## 2017-07-20 LAB — MAGNESIUM: MAGNESIUM: 1.8 mg/dL (ref 1.7–2.4)

## 2017-07-21 LAB — POTASSIUM: POTASSIUM: 3.6 mmol/L (ref 3.5–5.1)

## 2017-07-21 LAB — LIPID PANEL
CHOL/HDL RATIO: 3.4 ratio
CHOLESTEROL: 74 mg/dL (ref 0–200)
HDL: 22 mg/dL — ABNORMAL LOW (ref >40)
LDL Cholesterol: 36 mg/dL (ref 0–99)
Triglycerides: 80 mg/dL (ref <150)
VLDL: 16 mg/dL (ref 0–40)

## 2017-07-23 LAB — CBC
HCT: 28.9 % — ABNORMAL LOW (ref 36.0–46.0)
HEMOGLOBIN: 8.5 g/dL — AB (ref 12.0–15.0)
MCH: 29.2 pg (ref 26.0–34.0)
MCHC: 29.4 g/dL — ABNORMAL LOW (ref 30.0–36.0)
MCV: 99.3 fL (ref 78.0–100.0)
PLATELETS: 224 10*3/uL (ref 150–400)
RBC: 2.91 MIL/uL — ABNORMAL LOW (ref 3.87–5.11)
RDW: 27 % — ABNORMAL HIGH (ref 11.5–15.5)
WBC: 12.6 10*3/uL — AB (ref 4.0–10.5)

## 2017-07-23 LAB — BASIC METABOLIC PANEL
ANION GAP: 11 (ref 5–15)
BUN: 44 mg/dL — AB (ref 6–20)
CO2: 31 mmol/L (ref 22–32)
Calcium: 8.2 mg/dL — ABNORMAL LOW (ref 8.9–10.3)
Chloride: 108 mmol/L (ref 101–111)
Creatinine, Ser: 1.01 mg/dL — ABNORMAL HIGH (ref 0.44–1.00)
GFR, EST NON AFRICAN AMERICAN: 52 mL/min — AB (ref >60)
Glucose, Bld: 93 mg/dL (ref 65–99)
Potassium: 4.4 mmol/L (ref 3.5–5.1)
SODIUM: 150 mmol/L — AB (ref 135–145)

## 2017-07-23 LAB — LIPID PANEL
CHOL/HDL RATIO: 3.8 ratio
CHOLESTEROL: 72 mg/dL (ref 0–200)
HDL: 19 mg/dL — ABNORMAL LOW (ref >40)
LDL CALC: 37 mg/dL (ref 0–99)
Triglycerides: 79 mg/dL (ref <150)
VLDL: 16 mg/dL (ref 0–40)

## 2017-07-24 ENCOUNTER — Encounter: Payer: Self-pay | Admitting: Cardiology

## 2017-07-24 DIAGNOSIS — E785 Hyperlipidemia, unspecified: Secondary | ICD-10-CM | POA: Diagnosis present

## 2017-07-24 DIAGNOSIS — Z950 Presence of cardiac pacemaker: Secondary | ICD-10-CM | POA: Diagnosis not present

## 2017-07-24 DIAGNOSIS — J9600 Acute respiratory failure, unspecified whether with hypoxia or hypercapnia: Secondary | ICD-10-CM | POA: Diagnosis not present

## 2017-07-24 DIAGNOSIS — E569 Vitamin deficiency, unspecified: Secondary | ICD-10-CM | POA: Diagnosis not present

## 2017-07-24 DIAGNOSIS — E46 Unspecified protein-calorie malnutrition: Secondary | ICD-10-CM | POA: Diagnosis not present

## 2017-07-24 DIAGNOSIS — E1122 Type 2 diabetes mellitus with diabetic chronic kidney disease: Secondary | ICD-10-CM | POA: Diagnosis present

## 2017-07-24 DIAGNOSIS — E872 Acidosis: Secondary | ICD-10-CM | POA: Diagnosis present

## 2017-07-24 DIAGNOSIS — R52 Pain, unspecified: Secondary | ICD-10-CM | POA: Diagnosis not present

## 2017-07-24 DIAGNOSIS — I481 Persistent atrial fibrillation: Secondary | ICD-10-CM | POA: Diagnosis present

## 2017-07-24 DIAGNOSIS — I10 Essential (primary) hypertension: Secondary | ICD-10-CM | POA: Diagnosis not present

## 2017-07-24 DIAGNOSIS — K219 Gastro-esophageal reflux disease without esophagitis: Secondary | ICD-10-CM | POA: Diagnosis not present

## 2017-07-24 DIAGNOSIS — J189 Pneumonia, unspecified organism: Secondary | ICD-10-CM | POA: Diagnosis not present

## 2017-07-24 DIAGNOSIS — R278 Other lack of coordination: Secondary | ICD-10-CM | POA: Diagnosis not present

## 2017-07-24 DIAGNOSIS — N183 Chronic kidney disease, stage 3 (moderate): Secondary | ICD-10-CM | POA: Diagnosis present

## 2017-07-24 DIAGNOSIS — G9340 Encephalopathy, unspecified: Secondary | ICD-10-CM | POA: Diagnosis not present

## 2017-07-24 DIAGNOSIS — R2681 Unsteadiness on feet: Secondary | ICD-10-CM | POA: Diagnosis not present

## 2017-07-24 DIAGNOSIS — D72820 Lymphocytosis (symptomatic): Secondary | ICD-10-CM | POA: Diagnosis not present

## 2017-07-24 DIAGNOSIS — R570 Cardiogenic shock: Secondary | ICD-10-CM | POA: Diagnosis not present

## 2017-07-24 DIAGNOSIS — Z96653 Presence of artificial knee joint, bilateral: Secondary | ICD-10-CM | POA: Diagnosis present

## 2017-07-24 DIAGNOSIS — I5022 Chronic systolic (congestive) heart failure: Secondary | ICD-10-CM | POA: Diagnosis present

## 2017-07-24 DIAGNOSIS — M1990 Unspecified osteoarthritis, unspecified site: Secondary | ICD-10-CM | POA: Diagnosis present

## 2017-07-24 DIAGNOSIS — I482 Chronic atrial fibrillation: Secondary | ICD-10-CM | POA: Diagnosis present

## 2017-07-24 DIAGNOSIS — E876 Hypokalemia: Secondary | ICD-10-CM | POA: Diagnosis present

## 2017-07-24 DIAGNOSIS — R1312 Dysphagia, oropharyngeal phase: Secondary | ICD-10-CM | POA: Diagnosis not present

## 2017-07-24 DIAGNOSIS — Z885 Allergy status to narcotic agent status: Secondary | ICD-10-CM | POA: Diagnosis not present

## 2017-07-24 DIAGNOSIS — Z7189 Other specified counseling: Secondary | ICD-10-CM | POA: Diagnosis not present

## 2017-07-24 DIAGNOSIS — I2510 Atherosclerotic heart disease of native coronary artery without angina pectoris: Secondary | ICD-10-CM | POA: Diagnosis present

## 2017-07-24 DIAGNOSIS — J9601 Acute respiratory failure with hypoxia: Secondary | ICD-10-CM | POA: Diagnosis not present

## 2017-07-24 DIAGNOSIS — M6281 Muscle weakness (generalized): Secondary | ICD-10-CM | POA: Diagnosis not present

## 2017-07-24 DIAGNOSIS — I130 Hypertensive heart and chronic kidney disease with heart failure and stage 1 through stage 4 chronic kidney disease, or unspecified chronic kidney disease: Secondary | ICD-10-CM | POA: Diagnosis present

## 2017-07-24 DIAGNOSIS — I469 Cardiac arrest, cause unspecified: Secondary | ICD-10-CM | POA: Diagnosis not present

## 2017-07-24 DIAGNOSIS — E119 Type 2 diabetes mellitus without complications: Secondary | ICD-10-CM | POA: Diagnosis not present

## 2017-07-24 DIAGNOSIS — K922 Gastrointestinal hemorrhage, unspecified: Secondary | ICD-10-CM | POA: Diagnosis not present

## 2017-07-24 DIAGNOSIS — I4891 Unspecified atrial fibrillation: Secondary | ICD-10-CM | POA: Diagnosis not present

## 2017-07-24 DIAGNOSIS — J811 Chronic pulmonary edema: Secondary | ICD-10-CM | POA: Diagnosis not present

## 2017-07-24 DIAGNOSIS — Z90710 Acquired absence of both cervix and uterus: Secondary | ICD-10-CM | POA: Diagnosis not present

## 2017-07-24 DIAGNOSIS — Z66 Do not resuscitate: Secondary | ICD-10-CM | POA: Diagnosis present

## 2017-07-24 DIAGNOSIS — E870 Hyperosmolality and hypernatremia: Secondary | ICD-10-CM | POA: Diagnosis present

## 2017-07-24 DIAGNOSIS — J80 Acute respiratory distress syndrome: Secondary | ICD-10-CM | POA: Diagnosis present

## 2017-07-24 DIAGNOSIS — K559 Vascular disorder of intestine, unspecified: Secondary | ICD-10-CM | POA: Diagnosis present

## 2017-07-24 DIAGNOSIS — F329 Major depressive disorder, single episode, unspecified: Secondary | ICD-10-CM | POA: Diagnosis not present

## 2017-07-24 DIAGNOSIS — D649 Anemia, unspecified: Secondary | ICD-10-CM | POA: Diagnosis present

## 2017-07-24 DIAGNOSIS — E039 Hypothyroidism, unspecified: Secondary | ICD-10-CM | POA: Diagnosis present

## 2017-07-24 DIAGNOSIS — K5900 Constipation, unspecified: Secondary | ICD-10-CM | POA: Diagnosis not present

## 2017-07-24 DIAGNOSIS — I509 Heart failure, unspecified: Secondary | ICD-10-CM | POA: Diagnosis not present

## 2017-07-24 DIAGNOSIS — I5023 Acute on chronic systolic (congestive) heart failure: Secondary | ICD-10-CM | POA: Diagnosis not present

## 2017-07-24 DIAGNOSIS — I4901 Ventricular fibrillation: Secondary | ICD-10-CM | POA: Diagnosis present

## 2017-07-24 DIAGNOSIS — G931 Anoxic brain damage, not elsewhere classified: Secondary | ICD-10-CM | POA: Diagnosis present

## 2017-07-24 LAB — BASIC METABOLIC PANEL
ANION GAP: 10 (ref 5–15)
BUN: 40 mg/dL — AB (ref 6–20)
CO2: 28 mmol/L (ref 22–32)
Calcium: 7.8 mg/dL — ABNORMAL LOW (ref 8.9–10.3)
Chloride: 104 mmol/L (ref 101–111)
Creatinine, Ser: 0.98 mg/dL (ref 0.44–1.00)
GFR calc Af Amer: >60 mL/min (ref >60)
GFR, EST NON AFRICAN AMERICAN: 54 mL/min — AB (ref >60)
GLUCOSE: 123 mg/dL — AB (ref 65–99)
POTASSIUM: 3.8 mmol/L (ref 3.5–5.1)
SODIUM: 142 mmol/L (ref 135–145)

## 2017-07-24 LAB — MAGNESIUM: MAGNESIUM: 1.8 mg/dL (ref 1.7–2.4)

## 2017-07-28 ENCOUNTER — Emergency Department (HOSPITAL_COMMUNITY): Payer: Medicare Other

## 2017-07-28 ENCOUNTER — Encounter (HOSPITAL_COMMUNITY): Payer: Self-pay | Admitting: Emergency Medicine

## 2017-07-28 ENCOUNTER — Inpatient Hospital Stay (HOSPITAL_COMMUNITY)
Admission: EM | Admit: 2017-07-28 | Discharge: 2017-08-13 | DRG: 308 | Disposition: E | Payer: Medicare Other | Attending: Pulmonary Disease | Admitting: Pulmonary Disease

## 2017-07-28 ENCOUNTER — Other Ambulatory Visit (HOSPITAL_COMMUNITY): Payer: Medicare Other

## 2017-07-28 DIAGNOSIS — I5022 Chronic systolic (congestive) heart failure: Secondary | ICD-10-CM | POA: Diagnosis present

## 2017-07-28 DIAGNOSIS — Z950 Presence of cardiac pacemaker: Secondary | ICD-10-CM

## 2017-07-28 DIAGNOSIS — I428 Other cardiomyopathies: Secondary | ICD-10-CM | POA: Diagnosis present

## 2017-07-28 DIAGNOSIS — Z794 Long term (current) use of insulin: Secondary | ICD-10-CM

## 2017-07-28 DIAGNOSIS — K559 Vascular disorder of intestine, unspecified: Secondary | ICD-10-CM | POA: Diagnosis present

## 2017-07-28 DIAGNOSIS — G931 Anoxic brain damage, not elsewhere classified: Secondary | ICD-10-CM | POA: Diagnosis present

## 2017-07-28 DIAGNOSIS — E039 Hypothyroidism, unspecified: Secondary | ICD-10-CM | POA: Diagnosis present

## 2017-07-28 DIAGNOSIS — E1122 Type 2 diabetes mellitus with diabetic chronic kidney disease: Secondary | ICD-10-CM | POA: Diagnosis present

## 2017-07-28 DIAGNOSIS — Z7189 Other specified counseling: Secondary | ICD-10-CM

## 2017-07-28 DIAGNOSIS — Z7952 Long term (current) use of systemic steroids: Secondary | ICD-10-CM

## 2017-07-28 DIAGNOSIS — D72820 Lymphocytosis (symptomatic): Secondary | ICD-10-CM | POA: Diagnosis not present

## 2017-07-28 DIAGNOSIS — Z981 Arthrodesis status: Secondary | ICD-10-CM

## 2017-07-28 DIAGNOSIS — J80 Acute respiratory distress syndrome: Secondary | ICD-10-CM | POA: Diagnosis present

## 2017-07-28 DIAGNOSIS — Z823 Family history of stroke: Secondary | ICD-10-CM

## 2017-07-28 DIAGNOSIS — I482 Chronic atrial fibrillation: Secondary | ICD-10-CM | POA: Diagnosis present

## 2017-07-28 DIAGNOSIS — I2510 Atherosclerotic heart disease of native coronary artery without angina pectoris: Secondary | ICD-10-CM | POA: Diagnosis present

## 2017-07-28 DIAGNOSIS — I469 Cardiac arrest, cause unspecified: Secondary | ICD-10-CM | POA: Diagnosis not present

## 2017-07-28 DIAGNOSIS — Z66 Do not resuscitate: Secondary | ICD-10-CM | POA: Diagnosis present

## 2017-07-28 DIAGNOSIS — J9600 Acute respiratory failure, unspecified whether with hypoxia or hypercapnia: Secondary | ICD-10-CM | POA: Diagnosis not present

## 2017-07-28 DIAGNOSIS — N183 Chronic kidney disease, stage 3 (moderate): Secondary | ICD-10-CM | POA: Diagnosis present

## 2017-07-28 DIAGNOSIS — I4901 Ventricular fibrillation: Principal | ICD-10-CM | POA: Diagnosis present

## 2017-07-28 DIAGNOSIS — Z96653 Presence of artificial knee joint, bilateral: Secondary | ICD-10-CM | POA: Diagnosis present

## 2017-07-28 DIAGNOSIS — R1312 Dysphagia, oropharyngeal phase: Secondary | ICD-10-CM | POA: Diagnosis not present

## 2017-07-28 DIAGNOSIS — Z9842 Cataract extraction status, left eye: Secondary | ICD-10-CM

## 2017-07-28 DIAGNOSIS — I4510 Unspecified right bundle-branch block: Secondary | ICD-10-CM | POA: Diagnosis present

## 2017-07-28 DIAGNOSIS — E876 Hypokalemia: Secondary | ICD-10-CM | POA: Diagnosis present

## 2017-07-28 DIAGNOSIS — I481 Persistent atrial fibrillation: Secondary | ICD-10-CM | POA: Diagnosis present

## 2017-07-28 DIAGNOSIS — E870 Hyperosmolality and hypernatremia: Secondary | ICD-10-CM | POA: Diagnosis present

## 2017-07-28 DIAGNOSIS — G9340 Encephalopathy, unspecified: Secondary | ICD-10-CM | POA: Diagnosis not present

## 2017-07-28 DIAGNOSIS — I130 Hypertensive heart and chronic kidney disease with heart failure and stage 1 through stage 4 chronic kidney disease, or unspecified chronic kidney disease: Secondary | ICD-10-CM | POA: Diagnosis present

## 2017-07-28 DIAGNOSIS — E872 Acidosis: Secondary | ICD-10-CM | POA: Diagnosis present

## 2017-07-28 DIAGNOSIS — Z885 Allergy status to narcotic agent status: Secondary | ICD-10-CM | POA: Diagnosis not present

## 2017-07-28 DIAGNOSIS — Z888 Allergy status to other drugs, medicaments and biological substances status: Secondary | ICD-10-CM

## 2017-07-28 DIAGNOSIS — I959 Hypotension, unspecified: Secondary | ICD-10-CM | POA: Diagnosis present

## 2017-07-28 DIAGNOSIS — E785 Hyperlipidemia, unspecified: Secondary | ICD-10-CM | POA: Diagnosis present

## 2017-07-28 DIAGNOSIS — I483 Typical atrial flutter: Secondary | ICD-10-CM | POA: Diagnosis present

## 2017-07-28 DIAGNOSIS — Z833 Family history of diabetes mellitus: Secondary | ICD-10-CM

## 2017-07-28 DIAGNOSIS — Z86010 Personal history of colonic polyps: Secondary | ICD-10-CM

## 2017-07-28 DIAGNOSIS — R570 Cardiogenic shock: Secondary | ICD-10-CM | POA: Diagnosis not present

## 2017-07-28 DIAGNOSIS — Z8249 Family history of ischemic heart disease and other diseases of the circulatory system: Secondary | ICD-10-CM

## 2017-07-28 DIAGNOSIS — Z7901 Long term (current) use of anticoagulants: Secondary | ICD-10-CM

## 2017-07-28 DIAGNOSIS — M8580 Other specified disorders of bone density and structure, unspecified site: Secondary | ICD-10-CM | POA: Diagnosis present

## 2017-07-28 DIAGNOSIS — I5023 Acute on chronic systolic (congestive) heart failure: Secondary | ICD-10-CM | POA: Diagnosis not present

## 2017-07-28 DIAGNOSIS — D649 Anemia, unspecified: Secondary | ICD-10-CM | POA: Diagnosis present

## 2017-07-28 DIAGNOSIS — Z90710 Acquired absence of both cervix and uterus: Secondary | ICD-10-CM

## 2017-07-28 DIAGNOSIS — Z9841 Cataract extraction status, right eye: Secondary | ICD-10-CM

## 2017-07-28 DIAGNOSIS — J811 Chronic pulmonary edema: Secondary | ICD-10-CM | POA: Diagnosis not present

## 2017-07-28 DIAGNOSIS — J9601 Acute respiratory failure with hypoxia: Secondary | ICD-10-CM

## 2017-07-28 DIAGNOSIS — Z79890 Hormone replacement therapy (postmenopausal): Secondary | ICD-10-CM

## 2017-07-28 DIAGNOSIS — M1990 Unspecified osteoarthritis, unspecified site: Secondary | ICD-10-CM | POA: Diagnosis present

## 2017-07-28 DIAGNOSIS — R402432 Glasgow coma scale score 3-8, at arrival to emergency department: Secondary | ICD-10-CM | POA: Diagnosis present

## 2017-07-28 DIAGNOSIS — Z79899 Other long term (current) drug therapy: Secondary | ICD-10-CM

## 2017-07-28 DIAGNOSIS — Z841 Family history of disorders of kidney and ureter: Secondary | ICD-10-CM

## 2017-07-28 DIAGNOSIS — J189 Pneumonia, unspecified organism: Secondary | ICD-10-CM | POA: Diagnosis not present

## 2017-07-28 DIAGNOSIS — E119 Type 2 diabetes mellitus without complications: Secondary | ICD-10-CM | POA: Diagnosis not present

## 2017-07-28 DIAGNOSIS — M6281 Muscle weakness (generalized): Secondary | ICD-10-CM | POA: Diagnosis not present

## 2017-07-28 LAB — CBC WITH DIFFERENTIAL/PLATELET
BASOS PCT: 0 %
Basophils Absolute: 0 10*3/uL (ref 0.0–0.1)
EOS ABS: 0.2 10*3/uL (ref 0.0–0.7)
Eosinophils Relative: 1 %
HCT: 20.9 % — ABNORMAL LOW (ref 36.0–46.0)
HEMOGLOBIN: 6.4 g/dL — AB (ref 12.0–15.0)
LYMPHS PCT: 49 %
Lymphs Abs: 10.5 10*3/uL — ABNORMAL HIGH (ref 0.7–4.0)
MCH: 30.8 pg (ref 26.0–34.0)
MCHC: 30.6 g/dL (ref 30.0–36.0)
MCV: 100.5 fL — ABNORMAL HIGH (ref 78.0–100.0)
Monocytes Absolute: 0.9 10*3/uL (ref 0.1–1.0)
Monocytes Relative: 4 %
NEUTROS ABS: 9.9 10*3/uL — AB (ref 1.7–7.7)
Neutrophils Relative %: 46 %
PLATELETS: 124 10*3/uL — AB (ref 150–400)
RBC: 2.08 MIL/uL — ABNORMAL LOW (ref 3.87–5.11)
RDW: 25.2 % — ABNORMAL HIGH (ref 11.5–15.5)
WBC: 21.5 10*3/uL — ABNORMAL HIGH (ref 4.0–10.5)

## 2017-07-28 LAB — I-STAT TROPONIN, ED: Troponin i, poc: 0.58 ng/mL (ref 0.00–0.08)

## 2017-07-28 LAB — PATHOLOGIST SMEAR REVIEW

## 2017-07-28 LAB — I-STAT ARTERIAL BLOOD GAS, ED
Acid-base deficit: 16 mmol/L — ABNORMAL HIGH (ref 0.0–2.0)
Bicarbonate: 13.8 mmol/L — ABNORMAL LOW (ref 20.0–28.0)
O2 Saturation: 78 %
PH ART: 6.999 — AB (ref 7.350–7.450)
TCO2: 16 mmol/L — ABNORMAL LOW (ref 22–32)
pCO2 arterial: 56.2 mmHg — ABNORMAL HIGH (ref 32.0–48.0)
pO2, Arterial: 64 mmHg — ABNORMAL LOW (ref 83.0–108.0)

## 2017-07-28 LAB — BASIC METABOLIC PANEL
ANION GAP: 10 (ref 5–15)
BUN: 28 mg/dL — ABNORMAL HIGH (ref 6–20)
CO2: 28 mmol/L (ref 22–32)
CREATININE: 1.02 mg/dL — AB (ref 0.44–1.00)
Chloride: 112 mmol/L — ABNORMAL HIGH (ref 101–111)
GFR calc Af Amer: 60 mL/min — ABNORMAL LOW (ref >60)
GFR calc non Af Amer: 52 mL/min — ABNORMAL LOW (ref >60)
GLUCOSE: 236 mg/dL — AB (ref 65–99)
Potassium: 3.9 mmol/L (ref 3.5–5.1)
Sodium: 150 mmol/L — ABNORMAL HIGH (ref 135–145)

## 2017-07-28 LAB — CBG MONITORING, ED: GLUCOSE-CAPILLARY: 121 mg/dL — AB (ref 65–99)

## 2017-07-28 LAB — I-STAT CG4 LACTIC ACID, ED: LACTIC ACID, VENOUS: 11.38 mmol/L — AB (ref 0.5–1.9)

## 2017-07-28 MED ORDER — PIPERACILLIN-TAZOBACTAM 3.375 G IVPB 30 MIN
3.3750 g | Freq: Once | INTRAVENOUS | Status: DC
  Filled 2017-07-28: qty 50

## 2017-07-28 MED ORDER — SODIUM CHLORIDE 0.9 % IV SOLN: Freq: Once | INTRAVENOUS | Status: DC

## 2017-07-28 MED ORDER — EPINEPHRINE PF 1 MG/10ML IJ SOSY
PREFILLED_SYRINGE | INTRAMUSCULAR | Status: AC | PRN
  Administered 2017-07-28: 1 via INTRAVENOUS

## 2017-07-28 MED ORDER — ASPIRIN 300 MG RE SUPP
300.0000 mg | RECTAL | Status: AC
  Administered 2017-07-28: 300 mg via RECTAL
  Filled 2017-07-28: qty 1

## 2017-07-28 MED ORDER — DEXTROSE 5 % IV SOLN
0.5000 ug/min | INTRAVENOUS | Status: DC
  Filled 2017-07-28: qty 4

## 2017-07-28 MED ORDER — HYDROCORTISONE NA SUCCINATE PF 100 MG IJ SOLR
50.0000 mg | Freq: Four times a day (QID) | INTRAMUSCULAR | Status: DC
  Filled 2017-07-28: qty 2

## 2017-07-28 MED ORDER — CALCIUM CHLORIDE 10 % IV SOLN
INTRAVENOUS | Status: AC | PRN
  Administered 2017-07-28: 1 g via INTRAVENOUS

## 2017-07-28 MED ORDER — SODIUM BICARBONATE 8.4 % IV SOLN
INTRAVENOUS | Status: DC
  Filled 2017-07-28: qty 150

## 2017-07-28 MED ORDER — PANTOPRAZOLE SODIUM 40 MG PO PACK
40.0000 mg | PACK | Freq: Every day | ORAL | Status: DC
  Filled 2017-07-28: qty 20

## 2017-07-28 MED ORDER — ASPIRIN 81 MG PO CHEW: 324.0000 mg | CHEWABLE_TABLET | ORAL | Status: AC

## 2017-07-28 MED ORDER — PHENYLEPHRINE HCL 10 MG/ML IJ SOLN
0.0000 ug/min | INTRAVENOUS | Status: DC
  Administered 2017-07-28: 20 ug/min via INTRAVENOUS
  Filled 2017-07-28: qty 1

## 2017-07-28 MED ORDER — SODIUM BICARBONATE 8.4 % IV SOLN
INTRAVENOUS | Status: AC | PRN
  Administered 2017-07-28: 50 meq via INTRAVENOUS

## 2017-07-28 MED ORDER — SODIUM CHLORIDE 0.9 % IV BOLUS
1000.0000 mL | Freq: Once | INTRAVENOUS | Status: AC
  Administered 2017-07-28: 1000 mL via INTRAVENOUS

## 2017-07-28 MED ORDER — ACETAMINOPHEN 325 MG PO TABS: 650.0000 mg | ORAL_TABLET | ORAL | Status: DC | PRN

## 2017-07-28 MED ORDER — SODIUM CHLORIDE 0.9 % IV SOLN: 250.0000 mL | INTRAVENOUS | Status: DC | PRN

## 2017-07-28 MED ORDER — EPINEPHRINE PF 1 MG/10ML IJ SOSY
PREFILLED_SYRINGE | INTRAMUSCULAR | Status: AC | PRN
  Administered 2017-07-28: 1 mg via INTRAVENOUS

## 2017-07-28 MED ORDER — SODIUM CHLORIDE 0.9 % IV SOLN
0.0000 ug/min | INTRAVENOUS | Status: DC
  Filled 2017-07-28: qty 1

## 2017-07-28 MED ORDER — VANCOMYCIN HCL 10 G IV SOLR
1500.0000 mg | Freq: Once | INTRAVENOUS | Status: DC
  Filled 2017-07-28: qty 1500

## 2017-07-28 MED ORDER — EPINEPHRINE PF 1 MG/ML IJ SOLN
0.5000 ug/min | INTRAVENOUS | Status: DC
  Administered 2017-07-28: 5 ug/min via INTRAVENOUS
  Filled 2017-07-28: qty 4

## 2017-07-29 ENCOUNTER — Inpatient Hospital Stay: Payer: Medicare Other | Admitting: Internal Medicine

## 2017-07-30 ENCOUNTER — Telehealth: Payer: Self-pay

## 2017-07-30 MED FILL — Medication: Qty: 1 | Status: AC

## 2017-07-30 NOTE — Telephone Encounter (Signed)
On 07/30/17 I received a d/c from [facility] (original). The d/c is for burial. The patient is a patient of Doctor Nelda Marseille. The d/c will be taken to [facility] for signature.  On 07/31/17 I received the d/c back from Doctor Nelda Marseille. I got the d/c ready and called the funeral home to let them know the d/c is ready for pickup.

## 2017-08-11 ENCOUNTER — Ambulatory Visit: Payer: Medicare Other | Admitting: Cardiovascular Disease

## 2017-08-13 ENCOUNTER — Telehealth: Payer: Self-pay

## 2017-08-13 NOTE — ED Triage Notes (Addendum)
Patient arrived with EMS from [facility] , found by staff unresponsive this morning , shock x4 by EMS for ventricular fibrillation , received Amiodarone 450 mg using intraosseous at right tibia , Epinephrine drip infusing , intubated by EMS ,CBG= 127, pacing at 60 /min. Bradycardia 40-50's /minand weak pulses at arrival .

## 2017-08-13 NOTE — ED Notes (Signed)
Dr. Nelda Marseille paged to RN Hassan Rowan per her request

## 2017-08-13 NOTE — ED Notes (Addendum)
CCM speaking with family. Family agrees they do not want to put her through another code.  Notified to not increase rate on neo (epi drip already maxed out).

## 2017-08-13 NOTE — Progress Notes (Signed)
   08-25-17 0900  Clinical Encounter Type  Visited With Patient and family together;Health care provider  Visit Type ED;Critical Care  Referral From Nurse  Consult/Referral To Chaplain  Spiritual Encounters  Spiritual Needs Emotional;Prayer  Stress Factors  Family Stress Factors Major life changes   Responded to more family that had arrived for this patient.  Patient was brought in CPR.  Family at bedside.  They indicated she had been doing some better and was at Rehab.  Family very upset and worried.  Provided empathetic listening and support as well as prayed with the family.  Will continue to follow.   Chaplain Katherene Ponto
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
## 2017-08-13 NOTE — Code Documentation (Signed)
Palpable femoral pulses present .

## 2017-08-13 NOTE — Progress Notes (Signed)
ABG results given to and read back and verified by Dr. Nelda Marseille, [place].

## 2017-08-13 NOTE — ED Notes (Signed)
Family states they are leaving.

## 2017-08-13 NOTE — ED Notes (Signed)
md notified of ca and hgb levels.

## 2017-08-13 NOTE — ED Notes (Signed)
Chaplain speaking with family at bedside.

## 2017-08-13 NOTE — Death Summary Note (Signed)
DEATH SUMMARY   Patient Details  Name: Becky Forbes MRN: [id number] DOB: [birth year]  Admission/Discharge Information   Admit Date:  2017-07-29  Date of Death: Date of Death: 07/29/17  Time of Death: Time of Death: 10  Length of Stay: 1  Referring Physician: Binnie Rail, MD   Reason(s) for Hospitalization  Cardiac arrest  Diagnoses  Preliminary cause of death:   Pulseless electrical activity cardiac arrest Secondary Diagnoses (including complications and co-morbidities):  Active Problems:   Cardiac arrest ([place]) Acute hypoxemic respiratory failure Anoxic brain injury Cardiogenic shock Hypokalemia  Brief Hospital Course (including significant findings, care, treatment, and services provided and events leading to death)  77 year old female with extensive cardiac history presenting after being found down in rehab facility.  EMS was called, VF was initial rhythm.  ACLS protocol was followed and patient was brought to ED for further evaluation and treatment.  PCCM was consulted for admission.  Family is bedside but not able to provide further history.  Per ED staff, cardiac arrest in the field, exact downtime is unknown.  Currently 100% pacer dependent on epi drip at 20 mcg and neo at 120 with pH of 6.99 and severely hypoxemic.  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  Presents with cardiac arrest and refractory cardiogenic shock.  Patient not responding to pressors.  Spoke with family, patient would not want this extreme level of care under current circumstances.  Will not escalate care at this point.    Patient's shock continued to worsen, was DNR and had another PEA episode and was declared dead in the ED.    Pertinent Labs and Studies  Significant Diagnostic Studies Ct Abdomen Wo Contrast  Result Date: 07/07/2017 CLINICAL DATA:  77 year old female with a history of dysphagia  and possible gastrostomy EXAM: CT ABDOMEN WITHOUT CONTRAST TECHNIQUE: Multidetector CT imaging of the abdomen was performed following the standard protocol without IV contrast. COMPARISON:  Chest CT 06/06/2017 FINDINGS: Lower chest: Dense calcifications of the native coronary vasculature. Partial imaging of cardiac pacing leads. Hiatal hernia. Mixed ground-glass and nodular opacities of the bilateral dependent lung bases, worst on the left. Small left pleural effusion. Hepatobiliary: Unremarkable appearance of the liver parenchyma. Dense material layer dependently within the gallbladder with calcified stones. Pancreas: Atrophic pancreatic tissue Spleen: Unremarkable spleen Adrenals/Urinary Tract: Unremarkable adrenal glands. Unremarkable appearance of the right kidney without hydronephrosis or nephrolithiasis. Unremarkable appearance of the left kidney with no hydronephrosis or nephrolithiasis. Stomach/Bowel: Hiatal hernia. Small amount of retained enteric contrast within the stomach lumen. Unremarkable small bowel without abnormal distention. No transition point. No focal inflammatory changes of the visualized small bowel. Enteric contrast within the colon.  No abnormal distention. Vascular/Lymphatic: Mild atherosclerotic changes of the visualized abdominal aorta. No aneurysm Other: Edema of the abdominal wall. Musculoskeletal: Surgical changes of prior lumbar fixation, incompletely imaged. No displaced fracture identified. IMPRESSION: Mixed nodular and ground-glass opacities of the bilateral lower lungs, similar in appearance to the CT dated 06/06/2017. Differential again includes ARDS, multifocal pneumonia, edema, sequela of aspiration, and/or interstitial pneumonitis. Atherosclerosis and evidence of coronary artery disease. Aortic Atherosclerosis (ICD10-I70.0). Dense material layered within the gallbladder, likely stones/microlithiasis. No evidence of acute inflammatory changes. Hiatal hernia Electronically  Signed   By: Corrie Mckusick D.O.   On: 07/07/2017 16:35   Dg Chest Port 1 View  Result Date: 07/29/2017 CLINICAL DATA:  Post cardiac arrest EXAM:  PORTABLE CHEST 1 VIEW COMPARISON:  07/09/2017; 06/30/2017 FINDINGS: Grossly unchanged cardiac silhouette and mediastinal contours with atherosclerotic plaque when the thoracic aorta. Endotracheal tube overlies the right mainstem bronchus. Enteric tube tip terminates below the left hemidiaphragm. Transcutaneous pacer pads overlie the ventricular apex. Worsening bilateral though perihilar predominant heterogeneous opacities with associated perihilar air bronchograms. No supine evidence of pleural effusion or pneumothorax. No acute osseus abnormalities. IMPRESSION: 1. Endotracheal tube overlies the right mainstem bronchus. Retraction approximately 5.3 cm is advised. No pneumothorax. 2. Findings most worrisome for worsening bilateral perihilar predominant alveolar pulmonary edema though note, aspiration could have a similar appearance. Continued attention on follow-up is advised. Critical Value/emergent results were called by telephone at the time of interpretation on 08-10-17 at 7:48 am to Dr. Veryl Speak , who verbally acknowledged these results. Electronically Signed   By: Sandi Mariscal M.D.   On: 2017-08-10 07:51   Dg Chest Port 1 View  Result Date: 07/09/2017 CLINICAL DATA:  Respiratory distress EXAM: PORTABLE CHEST 1 VIEW COMPARISON:  06/30/2017 FINDINGS: Cardiac enlargement.  Dual lead pacemaker unchanged. Extensive diffuse bilateral airspace disease unchanged. No significant effusion. IMPRESSION: No significant interval change diffuse bilateral airspace disease. Possible edema or pneumonia. Underlying chronic lung disease also present. Electronically Signed   By: Franchot Gallo M.D.   On: 07/09/2017 11:19    Microbiology No results found for this or any previous visit (from the past 240 hour(s)).  Lab Basic Metabolic Panel: Recent Labs  Lab  07/24/17 0553 10-Aug-2017 0717  NA 142 150*  K 3.8 3.9  CL 104 112*  CO2 28 28  GLUCOSE 123* 236*  BUN 40* 28*  CREATININE 0.98 1.02*  CALCIUM 7.8* >15.0*  MG 1.8  --    Liver Function Tests: No results for input(s): AST, ALT, ALKPHOS, BILITOT, PROT, ALBUMIN in the last 168 hours. No results for input(s): LIPASE, AMYLASE in the last 168 hours. No results for input(s): AMMONIA in the last 168 hours. CBC: Recent Labs  Lab 08-10-2017 0717  WBC 21.5*  NEUTROABS 9.9*  HGB 6.4*  HCT 20.9*  MCV 100.5*  PLT 124*   Cardiac Enzymes: No results for input(s): CKTOTAL, CKMB, CKMBINDEX, TROPONINI in the last 168 hours. Sepsis Labs: Recent Labs  Lab 10-Aug-2017 0717 Aug 10, 2017 0727  WBC 21.5*  --   LATICACIDVEN  --  11.38*    Procedures/Operations     Reizy Dunlow 07/30/2017, 11:04 AM

## 2017-08-13 NOTE — ED Notes (Signed)
PT HR became asystole when turning to clean stool.  MD notified.

## 2017-08-13 NOTE — Code Documentation (Signed)
Chest compressions initiated ( PEA ) , Dr. Stark Jock at bedside , RT bagging pt.

## 2017-08-13 NOTE — Progress Notes (Signed)
   08/21/2017 1200  Clinical Encounter Type  Visited With Patient and family together;Health care provider  Visit Type Death  Spiritual Encounters  Spiritual Needs Grief support;Emotional  Stress Factors  Family Stress Factors Loss   Continued to provide support for the family at the death of the patient.  Daughters and spouse at bedside.  Other family in consult room.  Obtained contact information and gave the family a patient placement card bc they are not sure of funeral, yet.   Chaplain Katherene Ponto

## 2017-08-13 NOTE — Consult Note (Addendum)
Cardiology Consultation:   Patient ID: Becky Forbes; [id number]; 26-Mar-1941   Admit date: 2017-08-08 Date of Consult: 2017/08/08  Primary Care Provider: Binnie Rail, MD Primary Cardiologist: Mertie Moores, MD Primary Electrophysiologist:  None   Patient Profile:   Becky Forbes is a 77 y.o. female with a hx of persistent atrial fib and atrial flutter (not on anticoagulation secondary to GI bleed), previously presumed NICM (varying EF over the years with improvement then recent decrease, no prior ischemic w/u), 3V coronary calcification on CT 05/2017, SSS s/p PPM 2015, DMII, HTN, HLD, CKD stage III (baseline -1.3), anemia, mild carotid disease by duplex 07/2016 who is being seen today for the evaluation of Vfib arrest at the request of Dr. Stark Jock.  History of Present Illness:   Becky Forbes has a complicated PMH and multiple admission in the past several months for acute respiratory failure with hypoxia and GI bleed. Work up included an echocardiogram 06/03/17 which showed EF 30-35%, diffuse HK, mild-mod MR, severely dilated LA/RA, mild TR, normal PASP, and a CT chest which showed patchy consolidation of the lungs and ground glass/thickening as well as nonspecific mediastinal and bilateral hilar adenopathy, likely reactive, as well as 3v coronary atherosclerosis. Acute respiratory failure thought to be 2/2 acute lung injury from amiodarone (for Afib) vs bromocriptine (unclear indication), both of which were discontinued.  She was last evaluated by cardiology 06/26/17 for elevated troponins thought to be demand ischemia in the setting of Afib RVR and acute GI bleed vs NSTEMI. She was thought not to be a candidate for ischemic evaluation due to acute blood loss 2/2 GIB, severely debilitated state, and poor nutritional status.   Unfortunately patient was found unresponsive by staff at [place] facility on the morning of 2017-08-08. EMS was activated and patient was found to be in Vfib arrest. She  was intubated prior to arrival and resuscitation achieved with defibrillation, amiodarone, and epinephrine gtt. She had subsequent PEA arrest in the ED and underwent chest compressions again with resuscitation. She is currently on epinephrine and phenylephrine drips with continued hypotension and bradycardia. She remains intubated and unresponsive. Family has decided to make patient a partial code due to poor prognosis. No further CPR.   Labs notable for hypernatremia, Cr 1.02, Calcium >15, WBC 21.5, Hgb 6.4, PLT 124, Lactic acid 11.38, Trop 0.58. EKG with atrial fibrillation with RBBB and LPFB and diffuse ST-T wave abnormalities. CXR with worsening bilateral perihilar predominant alveolar pulmonary edema.    Past Medical History:  Diagnosis Date  . Acute hypoxemic respiratory failure ([place])    a. 05/2017 - ? ARDS from bromocriptine vs amiodarone -> discharged to [facility].  . Acute on chronic respiratory failure with hypoxia ([place])   . Anemia    hx  . Arthritis   . Carotid artery disease ([place])    a. Carotid duplex 07/2016: 1-39% bilaterally, f/u PRN recommended.  . Chronic atrial fibrillation ([place])   . Chronic kidney disease, stage 3 ([place])   . Chronic systolic CHF (congestive heart failure) ([place])    a. presumed NICM with varying EF over the years. No prior ischemic workup.  . Chronic systolic heart failure ([place])   . CKD (chronic kidney disease), stage III ([place])    no nephrologist   . Complication of anesthesia   . Coronary artery calcification seen on CT scan   . Diabetes mellitus    x 10 yrs  . GI bleed    a. 04/2012 - lower GIB -  felt 2/2 mild ischemic colitis as shown on colonoscopy, was cleared for anticoag 1 week out from procedure.  Marland Kitchen Heart murmur    hx  . HTN (hypertension)   . Hyperlipemia   . Hypothyroidism   . Ischemic colitis ([place])    a. 04/2012 - lower GIB - felt 2/2 mild ischemic colitis as shown on colonoscopy..  . Persistent atrial fibrillation ([place])   . PONV (postoperative  nausea and vomiting)   . 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 [place] DR  dual-chamber pacemaker 03/2014 by Dr. Rayann Heman for sick sinus/sx bradycardia  . Renal insufficiency   . Sepsis [facility])     Past Surgical History:  Procedure Laterality Date  . ABDOMINAL HYSTERECTOMY    . APPENDECTOMY    . BACK SURGERY    . CARPAL TUNNEL RELEASE Right   . CATARACT EXTRACTION  05/25/2007   RIGHT EYE,left  . COLONOSCOPY  01/31/2009   small external/internal hemorrhoids, diverticulosis in sigmoid colon, one small polyp (biopsied). Biospy essentially normal. Dr.Magod  . COLONOSCOPY  04/30/2012   Procedure: COLONOSCOPY;  Surgeon: Cleotis Nipper, MD;  Location: [facility] ENDOSCOPY;  Service: Endoscopy;  Laterality: N/A;  unprepped, poss flex only  . ESOPHAGOGASTRODUODENOSCOPY (EGD) WITH PROPOFOL Left 06/27/2017   Procedure: ESOPHAGOGASTRODUODENOSCOPY (EGD) WITH PROPOFOL;  Surgeon: Otis Brace, MD;  Location: [place];  Service: Gastroenterology;  Laterality: Left;  . FOOT SURGERY Left 2006   MID FOOT RECONSTRUCTION  . GANGLION CYST EXCISION Right 12/2007   Dr.Gramig hand  . JOINT REPLACEMENT     bilateral  . LUMBAR LAMINECTOMY     x3 one fusion  . OSTEOTOMY Left 2006   AND FUSION  . PARS PLANA VITRECTOMY W/ REPAIR OF MACULAR HOLE Right 2010  . PERMANENT PACEMAKER INSERTION N/A 03/29/2014   STJ [employer]urity dual chamber paceamker implnated by Dr Rayann Heman  . TOTAL KNEE ARTHROPLASTY Bilateral 05/02/08, 07/25/08   Dr.Alusio     Home Medications:  Prior to Admission medications   Medication Sig Start Date End Date Taking? Authorizing Provider  acetaminophen (TYLENOL) 325 MG tablet Take 650 mg by mouth every 6 (six) hours as needed for moderate pain.    [provider]  apixaban (ELIQUIS) 5 MG TABS tablet Take 1 tablet (5 mg total) by mouth 2 (two) times daily. 06/13/17   Dessa Phi, DO  atorvastatin (LIPITOR) 20 MG tablet TAKE 1 TABLET BY MOUTH  DAILY Patient  taking differently: TAKE 20 mg TABLET BY MOUTH  DAILY 12/05/16   Nahser, Wonda Cheng, MD  cholecalciferol (VITAMIN D) 400 units TABS tablet Take 800 Units by mouth daily.    [provider]  furosemide (LASIX) 40 MG tablet Take 1 tablet (40 mg total) by mouth 2 (two) times daily. 06/13/17   Dessa Phi, DO  insulin glargine (LANTUS) 100 UNIT/ML injection Inject 0.3 mLs (30 Units total) into the skin daily. 06/14/17   Dessa Phi, DO  insulin lispro (HUMALOG) 100 UNIT/ML injection 4 units with meals PLUS meal-time sliding scale coverage 0-15 units with meals 06/13/17   Dessa Phi, DO  ipratropium (ATROVENT) 0.02 % nebulizer solution Take 2.5 mLs (0.5 mg total) by nebulization every 4 (four) hours as needed for wheezing or shortness of breath. 06/13/17   Dessa Phi, DO  levalbuterol (XOPENEX) 0.63 MG/3ML nebulizer solution Take 3 mLs (0.63 mg total) by nebulization every 4 (four) hours as needed for wheezing or shortness of breath. 06/13/17   Dessa Phi, DO  levothyroxine (SYNTHROID,  LEVOTHROID) 88 MCG tablet Take 1 tablet (88 mcg total) daily by mouth. 02/25/17   Burns, Claudina Lick, MD  metoprolol tartrate (LOPRESSOR) 25 MG tablet Take 0.5 tablets (12.5 mg total) by mouth 2 (two) times daily. 06/13/17 07/13/17  Dessa Phi, DO  predniSONE (DELTASONE) 10 MG tablet Take 4 tablets (40 mg total) by mouth daily. Start once IV solumedrol taper finished 06/13/17   Dessa Phi, DO    Inpatient Medications: Scheduled Meds:  Continuous Infusions: . epinephrine 20 mcg/min (08/26/2017 0902)  . phenylephrine (NEO-SYNEPHRINE) Adult infusion 120 mcg/min (26-Aug-2017 0910)   PRN Meds:   Allergies:    Allergies  Allergen Reactions  . Morphine And Related Itching, Nausea And Vomiting, Rash and Other (See Comments)    01/04/15- patient says she is NOT allergic to morphine.  Morphine IV given this AM 01/04/15 in the ED. No adverse reaction.  Celesta Gentile [Sitagliptin Phosphate] Other (See Comments)    Pt says  this caused elevated liver enzymes and creatinine. IS TAKING 1/2 TABLET  . Amiodarone     Possible pulmonary toxicity  . Bromocriptine     Possible contributor to pulmonary issues  . Actos [Pioglitazone Hydrochloride] Swelling    UNSPECIFIED REACTION   . Hydrocodone-Acetaminophen Itching, Nausea And Vomiting and Other (See Comments)    Per pt 05/12/15 she had this recently in the hospital with no issues at all  . Keflex [Cephalexin] Rash and Other (See Comments)    Burning sensation all over  . Percocet [Oxycodone-Acetaminophen] Nausea And Vomiting    Social History:   Social History   Socioeconomic History  . Marital status: Married    Spouse name: Javonte Elenes  . Number of children: 2  . Years of education: Not on file  . Highest education level: Not on file  Occupational History  . Occupation: RETIRED RN    Employer: [employer]    Comment: RETIRED IN 2007  Social Needs  . Financial resource strain: Not on file  . Food insecurity:    Worry: Not on file    Inability: Not on file  . Transportation needs:    Medical: Not on file    Non-medical: Not on file  Tobacco Use  . Smoking status: Never Smoker  . Smokeless tobacco: Never Used  Substance and Sexual Activity  . Alcohol use: No  . Drug use: No  . Sexual activity: Not Currently  Lifestyle  . Physical activity:    Days per week: Not on file    Minutes per session: Not on file  . Stress: Not on file  Relationships  . Social connections:    Talks on phone: Not on file    Gets together: Not on file    Attends religious service: Not on file    Active member of club or organization: Not on file    Attends meetings of clubs or organizations: Not on file    Relationship status: Not on file  . Intimate partner violence:    Fear of current or ex partner: Not on file    Emotionally abused: Not on file    Physically abused: Not on file    Forced sexual activity: Not on file  Other Topics Concern  . Not on  file  Social History Narrative  . Not on file    Family History:    Family History  Problem Relation Age of Onset  . Coronary artery disease Father   . Hypertension Father   . Stroke  Father   . Diabetes Father   . Kidney failure Mother   . Hypertension Mother   . Other Brother        SEPSIS  . Diabetes Brother   . Diabetes Sister   . Kidney failure Sister      ROS:  Please see the history of present illness.   All other ROS reviewed and negative.     Physical Exam/Data:   Vitals:   08/17/2017 0800 08-17-2017 0830 2017-08-17 0845 2017-08-17 0900  BP: (!) 70/43 (!) 68/44 (!) 72/43 (!) 88/40  Pulse: 60 (!) 59 (!) 59 (!) 59  Resp: 14 17 17 16   SpO2: (!) 88% (!) 76% (!) 85% (!) 82%    Intake/Output Summary (Last 24 hours) at 08/17/17 0913 Last data filed at 2017-08-17 9417 Gross per 24 hour  Intake 1000 ml  Output -  Net 1000 ml   There were no vitals filed for this visit. There is no height or weight on file to calculate BMI.  General:  critically ill appearing elderly female; intubated and non-responsive HEENT: sclera anicteric; fixed pupils; atraumatic Neck: JVD not assessed; IJ in Left side of neck Vascular: No carotid bruits; distal pulses 2+ bilaterally Cardiac:  normal S1, S2; RRR, no murmurs appreciated Lungs: intubated, coarse rhonchi throughout; breath sounds diminished at bases Abd: NABS, soft Ext: 2+ b/l edema Musculoskeletal:  No deformities, BUE and BLE strength normal and equal Skin: warm and dry  Neuro:  Unresponsive; not moving extremities  EKG:  The EKG was personally reviewed and demonstrates:  Atrial fibrillation    Relevant CV Studies: Echocardiogram 07/02/17: Study Conclusions  - Left ventricle: The cavity size was moderately dilated. Wall   thickness was increased in a pattern of moderate LVH. Systolic   function was moderately reduced. The estimated ejection fraction   was in the range of 35% to 40%. - Mitral valve: There was moderate  regurgitation. - Left atrium: The atrium was moderately dilated. - Tricuspid valve: There was mild-moderate regurgitation. - Pulmonary arteries: Systolic pressure was moderately increased.   PA peak pressure: 50 mm Hg (S).   Laboratory Data:  Chemistry Recent Labs  Lab 07/23/17 0523 07/24/17 0553 2017/08/17 0717  NA 150* 142 150*  K 4.4 3.8 3.9  CL 108 104 112*  CO2 31 28 28   GLUCOSE 93 123* 236*  BUN 44* 40* 28*  CREATININE 1.01* 0.98 1.02*  CALCIUM 8.2* 7.8* >15.0*  GFRNONAA 52* 54* 52*  GFRAA >60 >60 60*  ANIONGAP 11 10 10     No results for input(s): PROT, ALBUMIN, AST, ALT, ALKPHOS, BILITOT in the last 168 hours. Hematology Recent Labs  Lab 07/23/17 0523 08-17-17 0717  WBC 12.6* 21.5*  RBC 2.91* 2.08*  HGB 8.5* 6.4*  HCT 28.9* 20.9*  MCV 99.3 100.5*  MCH 29.2 30.8  MCHC 29.4* 30.6  RDW 27.0* 25.2*  PLT 224 124*   Cardiac EnzymesNo results for input(s): TROPONINI in the last 168 hours.  Recent Labs  Lab Aug 17, 2017 0724  TROPIPOC 0.58*    BNPNo results for input(s): BNP, PROBNP in the last 168 hours.  DDimer No results for input(s): DDIMER in the last 168 hours.  Radiology/Studies:  Dg Chest Port 1 View  Result Date: Aug 17, 2017 CLINICAL DATA:  Post cardiac arrest EXAM: PORTABLE CHEST 1 VIEW COMPARISON:  07/09/2017; 06/30/2017 FINDINGS: Grossly unchanged cardiac silhouette and mediastinal contours with atherosclerotic plaque when the thoracic aorta. Endotracheal tube overlies the right mainstem bronchus. Enteric tube tip terminates below  the left hemidiaphragm. Transcutaneous pacer pads overlie the ventricular apex. Worsening bilateral though perihilar predominant heterogeneous opacities with associated perihilar air bronchograms. No supine evidence of pleural effusion or pneumothorax. No acute osseus abnormalities. IMPRESSION: 1. Endotracheal tube overlies the right mainstem bronchus. Retraction approximately 5.3 cm is advised. No pneumothorax. 2. Findings most  worrisome for worsening bilateral perihilar predominant alveolar pulmonary edema though note, aspiration could have a similar appearance. Continued attention on follow-up is advised. Critical Value/emergent results were called by telephone at the time of interpretation on 08-26-17 at 7:48 am to Dr. Veryl Speak , who verbally acknowledged these results. Electronically Signed   By: Sandi Mariscal M.D.   On: Aug 26, 2017 07:51    Assessment and Plan:   1. Vfib arrest: intubated prior to arrival and resuscitation achieved with defibrillation, amiodarone, and epinephrine gtt. She had subsequent PEA arrest in the ED and underwent chest compressions again with resuscitation. She is currently on epinephrine and phenylephrine drips with continued hypotension and bradycardia. She remains intubated and unresponsive. She has a very poor prognosis and family has decided to defer further chest compressions.  - Unfortunately she is not a candidate for ischemic evaluation given poor prognosis (acute respiratory failure, acute on chronic anemia, lactic acidosis, malnutrition, etc.). No further cardiac recommendations at this time.  - Can continue supportive measures with epinephrine and phenylephrine gtts pending further GOC discussions with family.     For questions or updates, please contact [place] Please consult [email or web address] for contact info under Cardiology/STEMI.   Signed, Abigail Butts, PA-C  Aug 26, 2017 9:13 AM [phone]  History and all data above reviewed.  Patient examined.  I agree with the findings as above.  Patient seen this morning after cardiac arrest.  She was OK last night and not having any complaints per the family.  She was not having any SOB or pain.  Found unresponsive with Vfib and is now status post resuscitation but unresponsive on the ventilator with hypotension on multiple pressors.   She has had a recent prolonged hospital course and we saw her when she was in [facility]  hospital.  She has a new cardiomyopathy with continued elevated troponin.  There was no evidence of acute ST segment MI when she was in the chronic vent hospital and she did not have active evidence of pericarditis or myocarditis on echo although we did not do an MRI .  Because of her chronic comorbid conditions and frail state it was agreed that she would be managed medically and she was with meds to include beta blockers.  Her family reports that she was doing PT and starting to eat more at rehab.  There had been no acute complaints.   The patient exam reveals COR:RRR  ,  Lungs: Decreased breath sounds  ,  Abd: Absent BS, Ext No edema  .  All available labs, radiology testing, previous records reviewed. Agree with documented assessment and plan.   Shock:  Initial event not clear although possibly cardiac with vfib.  Now on full support.  I spoke with the family (two daughters and her husband at the bedside) and suggested that the prognosis is very poor and I suggested that further cardioversion or chest compressions would likely not alter her outcome.  For now I would suggest supportive care although they understand that she is on maximal therapy and the she has markedly abnormal labs as above.  Likely outcome is multiorgan failure.  They will continue supportive care only for now.  No indication for invasive evaluation.    Jeneen Rinks [facility]  10:39 AM  August 04, 2017

## 2017-08-13 NOTE — ED Notes (Signed)
Phlebotomy at bedside to draw blood.  RN spoke with family and they stated they do not want the pt to suffer anymore and they do not want blood drawn.  MD notified.  Blood transfusion to be cancelled.

## 2017-08-13 NOTE — ED Provider Notes (Signed)
[facility] EMERGENCY DEPARTMENT Provider Note   CSN: [id number] Arrival date & time: 08/10/17  7829     History   Chief Complaint Chief Complaint  Patient presents with  . Cardiac Arrest    HPI Becky Forbes is a 77 y.o. female.  This patient is a 77 year old female with past medical history of congestive heart failure, chronic atrial fibrillation, diabetes, pacemaker, and recent admission for acute hypoxemic respiratory failure.  She was on BiPAP while in the hospital, then discharged to a rehabilitation facility.  She was found this morning to be unresponsive.  EMS was called and found her to be in ventricular fibrillation.  She received 2 shocks along with medications, CPR, and was started on an epi drip.  She arrived here unresponsive and critically ill.     Past Medical History:  Diagnosis Date  . Acute hypoxemic respiratory failure ([place])    a. 05/2017 - ? ARDS from bromocriptine vs amiodarone -> discharged to [facility].  . Acute on chronic respiratory failure with hypoxia ([place])   . Anemia    hx  . Arthritis   . Carotid artery disease ([place])    a. Carotid duplex 07/2016: 1-39% bilaterally, f/u PRN recommended.  . Chronic atrial fibrillation ([place])   . Chronic kidney disease, stage 3 ([place])   . Chronic systolic CHF (congestive heart failure) ([place])    a. presumed NICM with varying EF over the years. No prior ischemic workup.  . Chronic systolic heart failure ([place])   . CKD (chronic kidney disease), stage III ([place])    no nephrologist   . Complication of anesthesia   . Coronary artery calcification seen on CT scan   . Diabetes mellitus    x 10 yrs  . GI bleed    a. 04/2012 - lower GIB - felt 2/2 mild ischemic colitis as shown on colonoscopy, was cleared for anticoag 1 week out from procedure.  Marland Kitchen Heart murmur    hx  . HTN (hypertension)   . Hyperlipemia   . Hypothyroidism   . Ischemic colitis ([place])    a. 04/2012 - lower GIB - felt 2/2 mild ischemic colitis  as shown on colonoscopy..  . Persistent atrial fibrillation ([place])   . PONV (postoperative nausea and vomiting)   . 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 [place] DR  dual-chamber pacemaker 03/2014 by Dr. Rayann Heman for sick sinus/sx bradycardia  . Renal insufficiency   . Sepsis [facility])     Patient Active Problem List   Diagnosis Date Noted  . Acute on chronic respiratory failure with hypoxia ([place])   . Chronic systolic heart failure ([place])   . Chronic atrial fibrillation ([place])   . Chronic kidney disease, stage 3 ([place])   . Acute kidney injury superimposed on chronic kidney disease ([place]) 06/26/2017  . Protein calorie malnutrition ([place]) 06/26/2017  . Hypoalbuminemia 06/26/2017  . Acute blood loss anemia 06/26/2017  . GI bleed 06/26/2017  . Amiodarone toxicity   . Persistent atrial fibrillation ([place])   . Acute on chronic systolic heart failure ([place])   . Body aches 06/02/2017  . Shortness of breath 06/02/2017  . Hypoxia 06/02/2017  . CAP (community acquired pneumonia) 06/02/2017  . Acute respiratory failure ([place]) 06/02/2017  . Acute exacerbation of CHF (congestive heart failure) ([place]) 06/02/2017  . Sepsis ([place]) 06/02/2017  . Fatigue 02/03/2017  . CKD (chronic kidney disease) 07/02/2016  . Spondylolisthesis of lumbar region 01/05/2016  . Diabetes ([place])  06/14/2015  . Fracture of rib of left side 04/07/2015  . 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 BRBPR (bright red blood per rectum) 01/04/2015  . Paroxysmal A-fib ([place]) 01/04/2015  . Colitis, acute 01/04/2015  . Benign essential HTN 01/04/2015  . History of colonic polyps 09/26/2014  . PAF (paroxysmal atrial fibrillation) ([place])   . Chronic combined systolic and diastolic CHF (congestive heart failure) ([place])   . Carotid artery disease ([place])   . Symptomatic sinus bradycardia 03/31/2014  . S/P cardiac pacemaker procedure, [employer] device 03/31/2014  . Sick sinus syndrome ([place])   . Typical atrial  flutter ([place])   . Ischemic colitis ([place]) 05/06/2012  . Diverticulosis 04/28/2012  . HYPERLIPIDEMIA 06/23/2007  . 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 Osteopenia 02/11/2007    Past Surgical History:  Procedure Laterality Date  . ABDOMINAL HYSTERECTOMY    . APPENDECTOMY    . BACK SURGERY    . CARPAL TUNNEL RELEASE Right   . CATARACT EXTRACTION  05/25/2007   RIGHT EYE,left  . COLONOSCOPY  01/31/2009   small external/internal hemorrhoids, diverticulosis in sigmoid colon, one small polyp (biopsied). Biospy essentially normal. Dr.Magod  . COLONOSCOPY  04/30/2012   Procedure: COLONOSCOPY;  Surgeon: Cleotis Nipper, MD;  Location[facility] ENDOSCOPY;  Service: Endoscopy;  Laterality: N/A;  unprepped, poss flex only  . ESOPHAGOGASTRODUODENOSCOPY (EGD) WITH PROPOFOL Left 06/27/2017   Procedure: ESOPHAGOGASTRODUODENOSCOPY (EGD) WITH PROPOFOL;  Surgeon: Otis Brace, MD;  Location: [facility];  Service: Gastroenterology;  Laterality: Left;  . FOOT SURGERY Left 2006   MID FOOT RECONSTRUCTION  . GANGLION CYST EXCISION Right 12/2007   Dr.Gramig hand  . JOINT REPLACEMENT     bilateral  . LUMBAR LAMINECTOMY     x3 one fusion  . OSTEOTOMY Left 2006   AND FUSION  . PARS PLANA VITRECTOMY W/ REPAIR OF MACULAR HOLE Right 2010  . PERMANENT PACEMAKER INSERTION N/A 03/29/2014   [employer]J [employer] dual chamber paceamker implnated by Dr Rayann Heman  . TOTAL KNEE ARTHROPLASTY Bilateral 05/02/08, 07/25/08   Dr.Alusio     OB History   None      Home Medications    Prior to Admission medications   Medication Sig Start Date End Date Taking? Authorizing Provider  acetaminophen (TYLENOL) 325 MG tablet Take 650 mg by mouth every 6 (six) hours as needed for moderate pain.    [provider]  apixaban (ELIQUIS) 5 MG TABS tablet Take 1 tablet (5 mg total) by mouth 2 (two) times daily. 06/13/17   Dessa Phi, DO  atorvastatin (LIPITOR) 20 MG tablet TAKE 1 TABLET BY MOUTH  DAILY Patient  taking differently: TAKE 20 mg TABLET BY MOUTH  DAILY 12/05/16   Nahser, Wonda Cheng, MD  cholecalciferol (VITAMIN D) 400 units TABS tablet Take 800 Units by mouth daily.    [provider]  furosemide (LASIX) 40 MG tablet Take 1 tablet (40 mg total) by mouth 2 (two) times daily. 06/13/17   Dessa Phi, DO  insulin glargine (LANTUS) 100 UNIT/ML injection Inject 0.3 mLs (30 Units total) into the skin daily. 06/14/17   Dessa Phi, DO  insulin lispro (HUMALOG) 100 UNIT/ML injection 4 units with meals PLUS meal-time sliding scale coverage 0-15 units with meals 06/13/17   Dessa Phi, DO  ipratropium (ATROVENT) 0.02 % nebulizer solution Take 2.5 mLs (0.5 mg total) by nebulization every 4 (four) hours as needed for wheezing or shortness of breath. 06/13/17   Dessa Phi, DO  levalbuterol (  XOPENEX) 0.63 MG/3ML nebulizer solution Take 3 mLs (0.63 mg total) by nebulization every 4 (four) hours as needed for wheezing or shortness of breath. 06/13/17   Dessa Phi, DO  levothyroxine (SYNTHROID, LEVOTHROID) 88 MCG tablet Take 1 tablet (88 mcg total) daily by mouth. 02/25/17   Burns, Claudina Lick, MD  metoprolol tartrate (LOPRESSOR) 25 MG tablet Take 0.5 tablets (12.5 mg total) by mouth 2 (two) times daily. 06/13/17 07/13/17  Dessa Phi, DO  predniSONE (DELTASONE) 10 MG tablet Take 4 tablets (40 mg total) by mouth daily. Start once IV solumedrol taper finished 06/13/17   Dessa Phi, DO    Family History Family History  Problem Relation Age of Onset  . Coronary artery disease Father   . Hypertension Father   . Stroke Father   . Diabetes Father   . Kidney failure Mother   . Hypertension Mother   . Other Brother        SEPSIS  . Diabetes Brother   . Diabetes Sister   . Kidney failure Sister     Social History Social History   Tobacco Use  . Smoking status: Never Smoker  . Smokeless tobacco: Never Used  Substance Use Topics  . Alcohol use: No  . Drug use: No     Allergies   Morphine  and related; Januvia [sitagliptin phosphate]; Amiodarone; Bromocriptine; Actos [pioglitazone hydrochloride]; Hydrocodone-acetaminophen; Keflex [cephalexin]; and Percocet [oxycodone-acetaminophen]   Review of Systems Review of Systems  Unable to perform ROS: Acuity of condition     Physical Exam Updated Vital Signs BP (!) 64/14   Pulse (!) 57   Resp 19   SpO2 100%   Physical Exam  Constitutional: No distress.  HENT:  Head: Normocephalic and atraumatic.  Eyes:  Pupils are 4 mm and fixed.  Neck: Normal range of motion. Neck supple.  Cardiovascular: Normal rate, regular rhythm and intact distal pulses.  Femoral pulses are easily palpable.  Pulmonary/Chest:  Breath sounds are coarse, diminished on the left.  Abdominal: Soft.  Musculoskeletal: Normal range of motion. She exhibits no edema.  Neurological:  Patient is unresponsive.  GCS is 3.  The only spontaneous movements noted are agonal respirations.  Skin: Skin is warm and dry. She is not diaphoretic.  Nursing note and vitals reviewed.    ED Treatments / Results  Labs (all labs ordered are listed, but only abnormal results are displayed) Labs Reviewed  I-STAT TROPONIN, ED - Abnormal; Notable for the following components:      Result Value   Troponin i, poc 0.58 (*)    All other components within normal limits  I-STAT CG4 LACTIC ACID, ED - Abnormal; Notable for the following components:   Lactic Acid, Venous 11.38 (*)    All other components within normal limits  CBG MONITORING, ED - Abnormal; Notable for the following components:   Glucose-Capillary 121 (*)    All other components within normal limits  BASIC METABOLIC PANEL  CBC WITH DIFFERENTIAL/PLATELET  I-STAT ARTERIAL BLOOD GAS, ED  I-STAT CHEM 8, ED    EKG None  Radiology Dg Chest Port 1 View  Result Date: Aug 21, 2017 CLINICAL DATA:  Post cardiac arrest EXAM: PORTABLE CHEST 1 VIEW COMPARISON:  07/09/2017; 06/30/2017 FINDINGS: Grossly unchanged cardiac  silhouette and mediastinal contours with atherosclerotic plaque when the thoracic aorta. Endotracheal tube overlies the right mainstem bronchus. Enteric tube tip terminates below the left hemidiaphragm. Transcutaneous pacer pads overlie the ventricular apex. Worsening bilateral though perihilar predominant heterogeneous opacities with associated perihilar air  bronchograms. No supine evidence of pleural effusion or pneumothorax. No acute osseus abnormalities. IMPRESSION: 1. Endotracheal tube overlies the right mainstem bronchus. Retraction approximately 5.3 cm is advised. No pneumothorax. 2. Findings most worrisome for worsening bilateral perihilar predominant alveolar pulmonary edema though note, aspiration could have a similar appearance. Continued attention on follow-up is advised. Critical Value/emergent results were called by telephone at the time of interpretation on 2017/08/23 at 7:48 am to Dr. Veryl Speak , who verbally acknowledged these results. Electronically Signed   By: Sandi Mariscal M.D.   On: 2017/08/23 07:51    Procedures Procedures (including critical care time)  Medications Ordered in ED Medications  sodium chloride 0.9 % bolus 1,000 mL (1,000 mLs Intravenous New Bag/Given 23-Aug-2017 0711)  EPINEPHrine (ADRENALIN) 4 mg in dextrose 5 % 250 mL (0.016 mg/mL) infusion (5 mcg/min Intravenous New Bag/Given Aug 23, 2017 0715)  phenylephrine (NEO-SYNEPHRINE) 10 mg in dextrose 5 % 250 mL (0.04 mg/mL) infusion (has no administration in time range)  EPINEPHrine (ADRENALIN) 1 MG/10ML injection (1 mg Intravenous Given 23-Aug-2017 0707)  sodium bicarbonate injection (50 mEq Intravenous Given 2017-08-23 0708)  calcium chloride injection (1 g Intravenous Given 08/23/2017 0709)  EPINEPHrine (ADRENALIN) 1 MG/10ML injection (1 Syringe Intravenous Given 08/23/17 9509)     Initial Impression / Assessment and Plan / ED Course  I have reviewed the triage vital signs and the nursing notes.  Pertinent labs & imaging results  that were available during my care of the patient were reviewed by me and considered in my medical decision making (see chart for details).  Patient is a 77 year old female with PMH as per HPI brought by EMS after being found in V-fib arrest requiring shocks, amiodarone, and an epi drip.    She arrived with a transcutaneous pacer in place with palpable pulses measurable blood pressure.  This was discontinued and she maintained a pulse and blood pressure for short period of time.  She did require additional chest compressions as well as IV epinephrine, bicarb.  I spoke with the patient's family regarding what appears to be a grim prognosis.  The patient has wanted to be a full code and the family is not supportive these wishes.  Resuscitative measures were continued.  She was continued on an epinephrine drip with no real improvement in her blood pressure.  I have spoken with Dr. Nelda Marseille from critical care and also the PA from the cardiology service.  Both will become involved in the patient's care.  CRITICAL CARE Performed by: Veryl Speak Total critical care time: 75 minutes Critical care time was exclusive of separately billable procedures and treating other patients. Critical care was necessary to treat or prevent imminent or life-threatening deterioration. Critical care was time spent personally by me on the following activities: development of treatment plan with patient and/or surrogate as well as nursing, discussions with consultants, evaluation of patient's response to treatment, examination of patient, obtaining history from patient or surrogate, ordering and performing treatments and interventions, ordering and review of laboratory studies, ordering and review of radiographic studies, pulse oximetry and re-evaluation of patient's condition.   Final Clinical Impressions(s) / ED Diagnoses   Final diagnoses:  None    ED Discharge Orders    None       Veryl Speak, MD 08-23-17  [phone]

## 2017-08-13 NOTE — ED Notes (Signed)
Dr Percival Spanish at bedside speaking with family.

## 2017-08-13 NOTE — ED Notes (Signed)
Time of death witnessed by RN Izora Gala and RN Lewis Shock.

## 2017-08-13 DEATH — deceased

## 2017-10-03 NOTE — Telephone Encounter (Signed)
This note was entered by mistake. °

## 2017-11-03 ENCOUNTER — Ambulatory Visit: Payer: Medicare Other | Admitting: Endocrinology

## 2019-10-14 IMAGING — US US RENAL
1 series · 14 of 25 positions shown · non-contrast
Comparison: CT from 01/04/2015 of the abdomen

CLINICAL DATA: Renal insufficiency, hypertension and grade 3
chronic renal disease. Acute tubular necrosis.

EXAM:
RENAL / URINARY TRACT ULTRASOUND COMPLETE

[Series 1: us renal · 0.24mm/px · 14 of 26 slices shown]
[im 1/26]
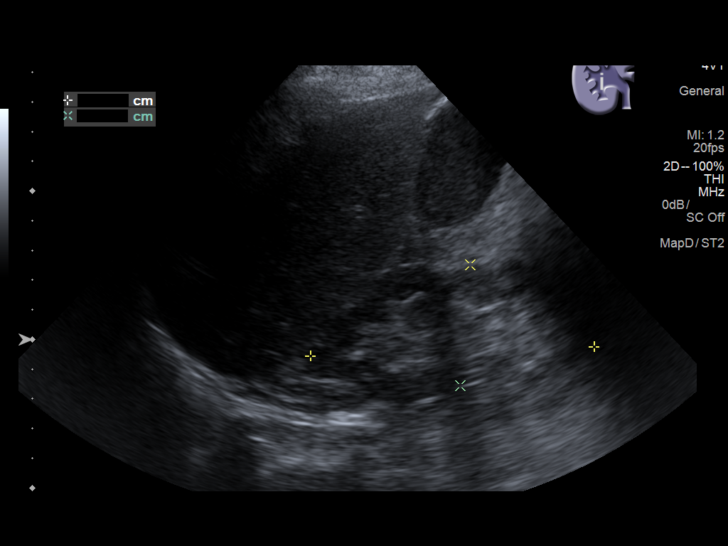
[im 3/26]
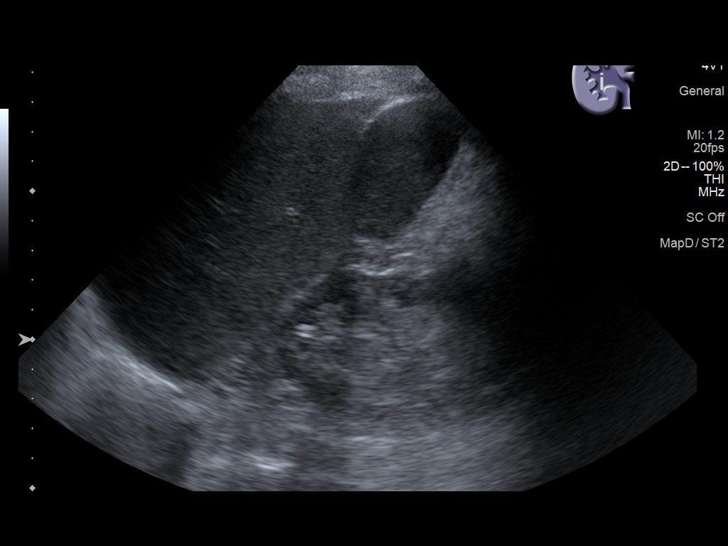
[im 5/26]
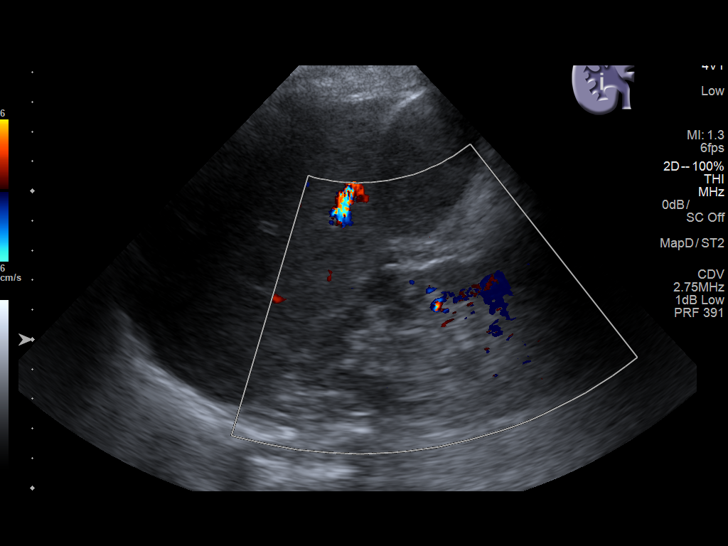
[im 7/26]
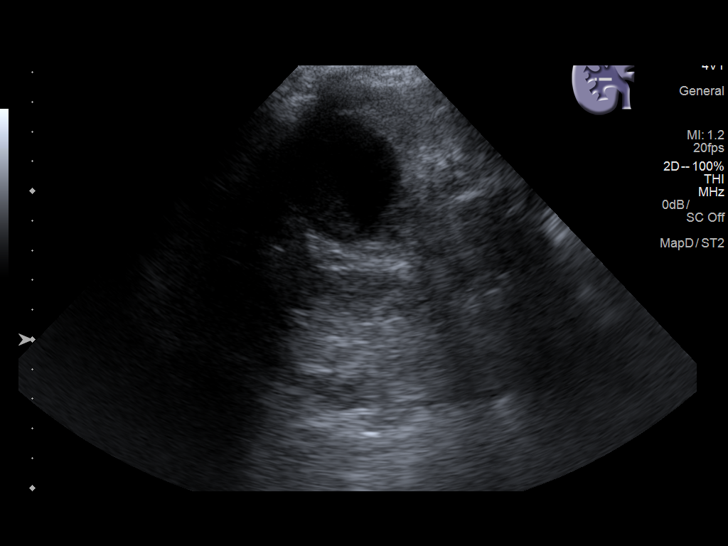
[im 9/26]
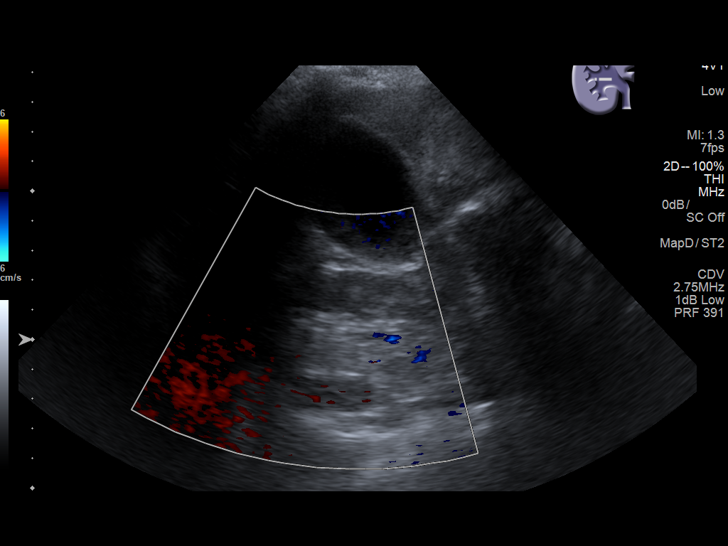
[im 10/26]
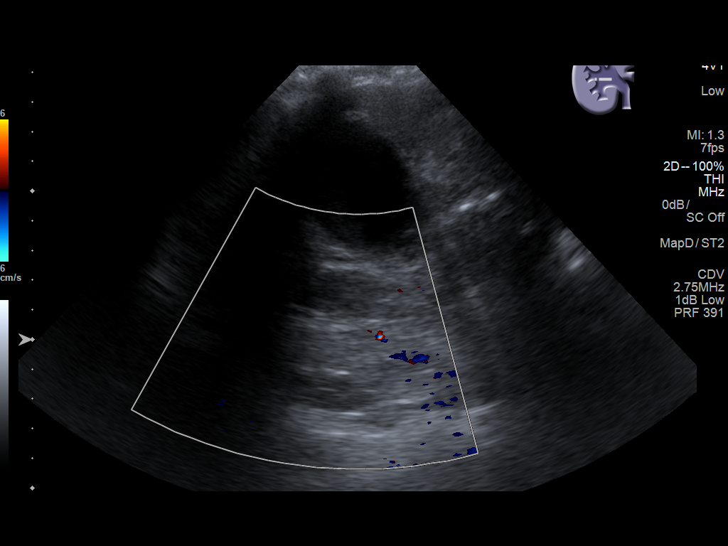
[im 12/26]
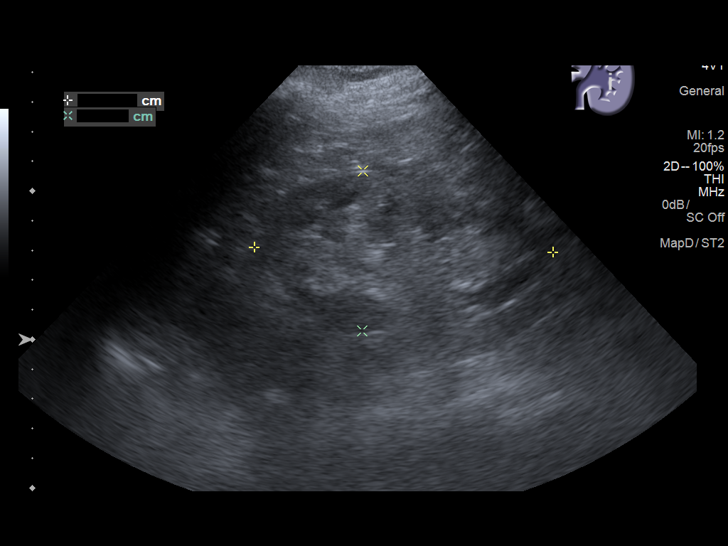
[im 14/26]
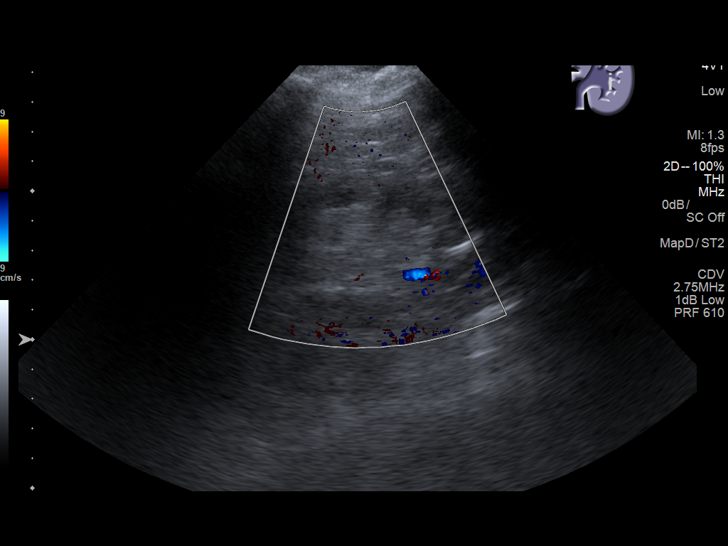
[im 16/26]
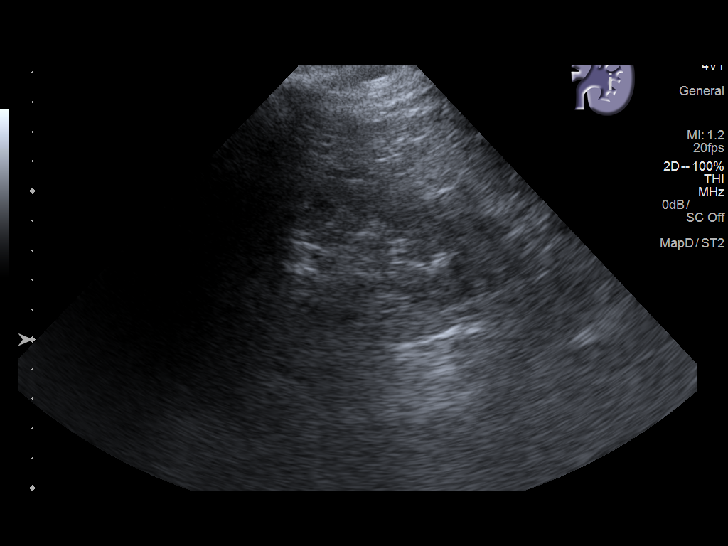
[im 17/26]
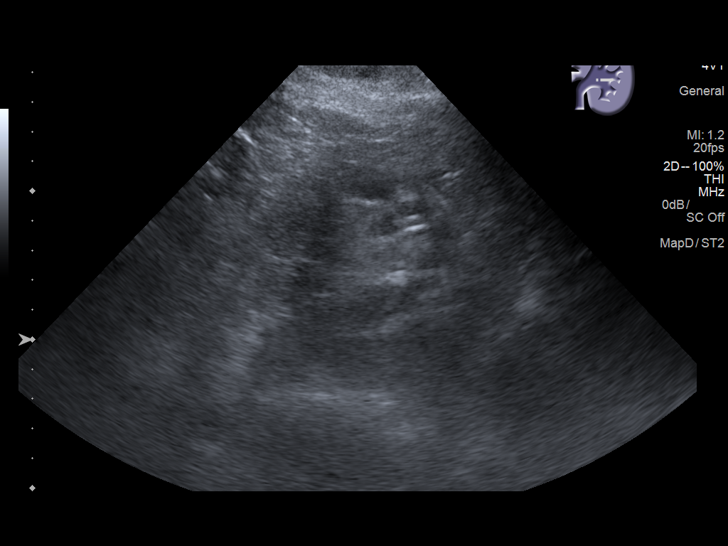
[im 19/26]
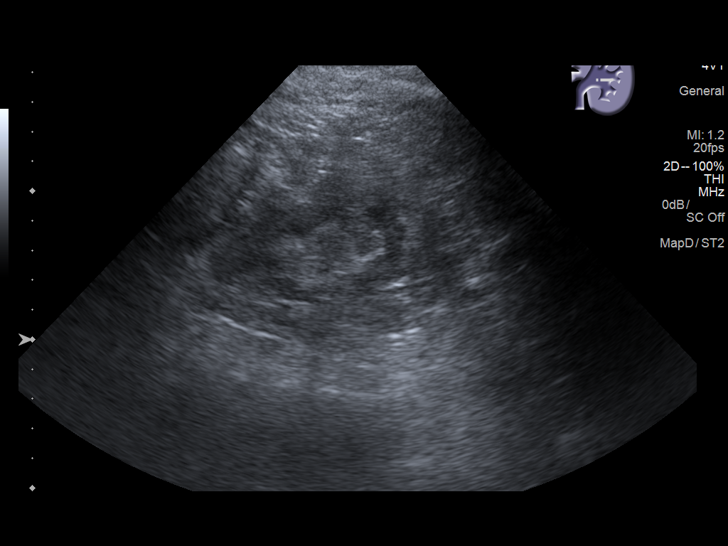
[im 21/26]
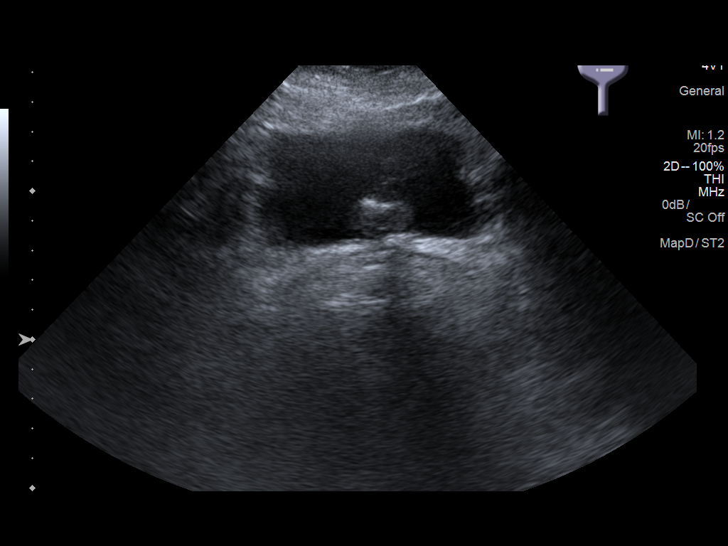
[im 23/26]
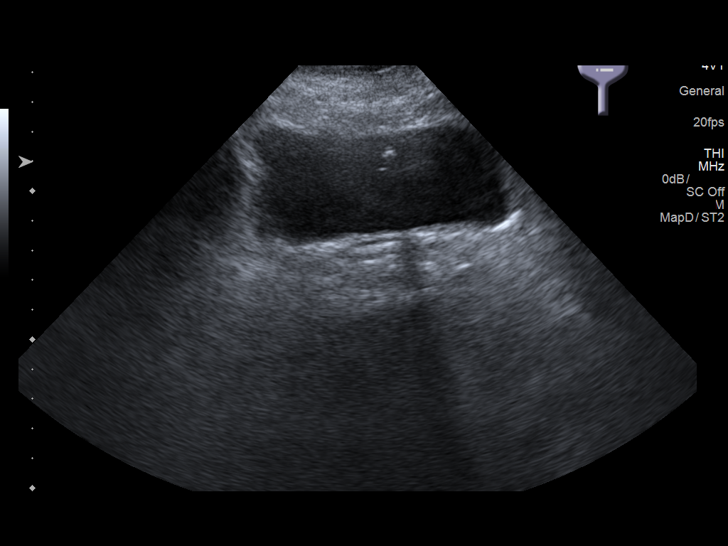
[im 26/26]
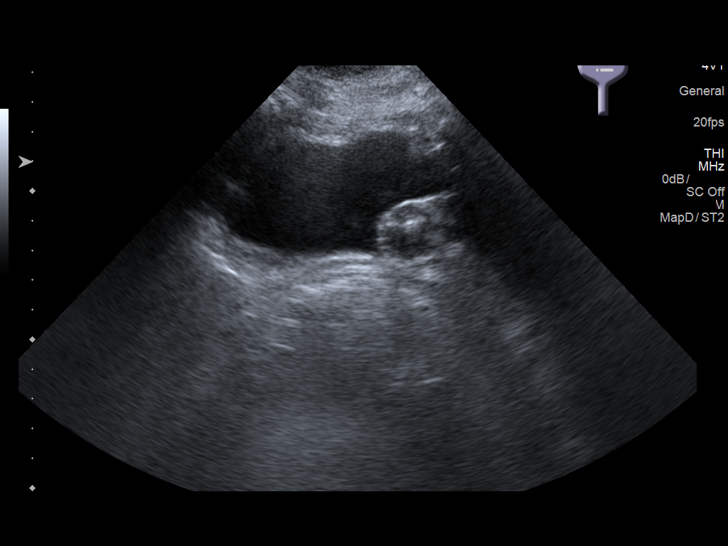

[14 of 25 positions shown; findings below may reference images not displayed]

FINDINGS: Right Kidney:

Length: 9.5 cm. Increased renal echogenicity without discrete mass,
nephrolithiasis nor hydronephrosis. Mild lobular appearance of the
renal cortex.

Left Kidney:

Length: 10 cm. Increased renal echogenicity. No nephrolithiasis nor
obstructive uropathy. Slightly lobular contour of the kidney. No
focal renal mass is noted however.

Bladder:

Partially decompressed by indwelling Foley catheter.
IMPRESSION: Increased renal echogenicity compatible with medical renal disease.

## 2019-11-22 IMAGING — DX DG CHEST 1V PORT
1 series · 1 of 1 positions shown · non-contrast
Comparison: Chest radiograph 06/10/2016

CLINICAL DATA: Pneumonitis.

EXAM:
PORTABLE CHEST 1 VIEW

[chest]
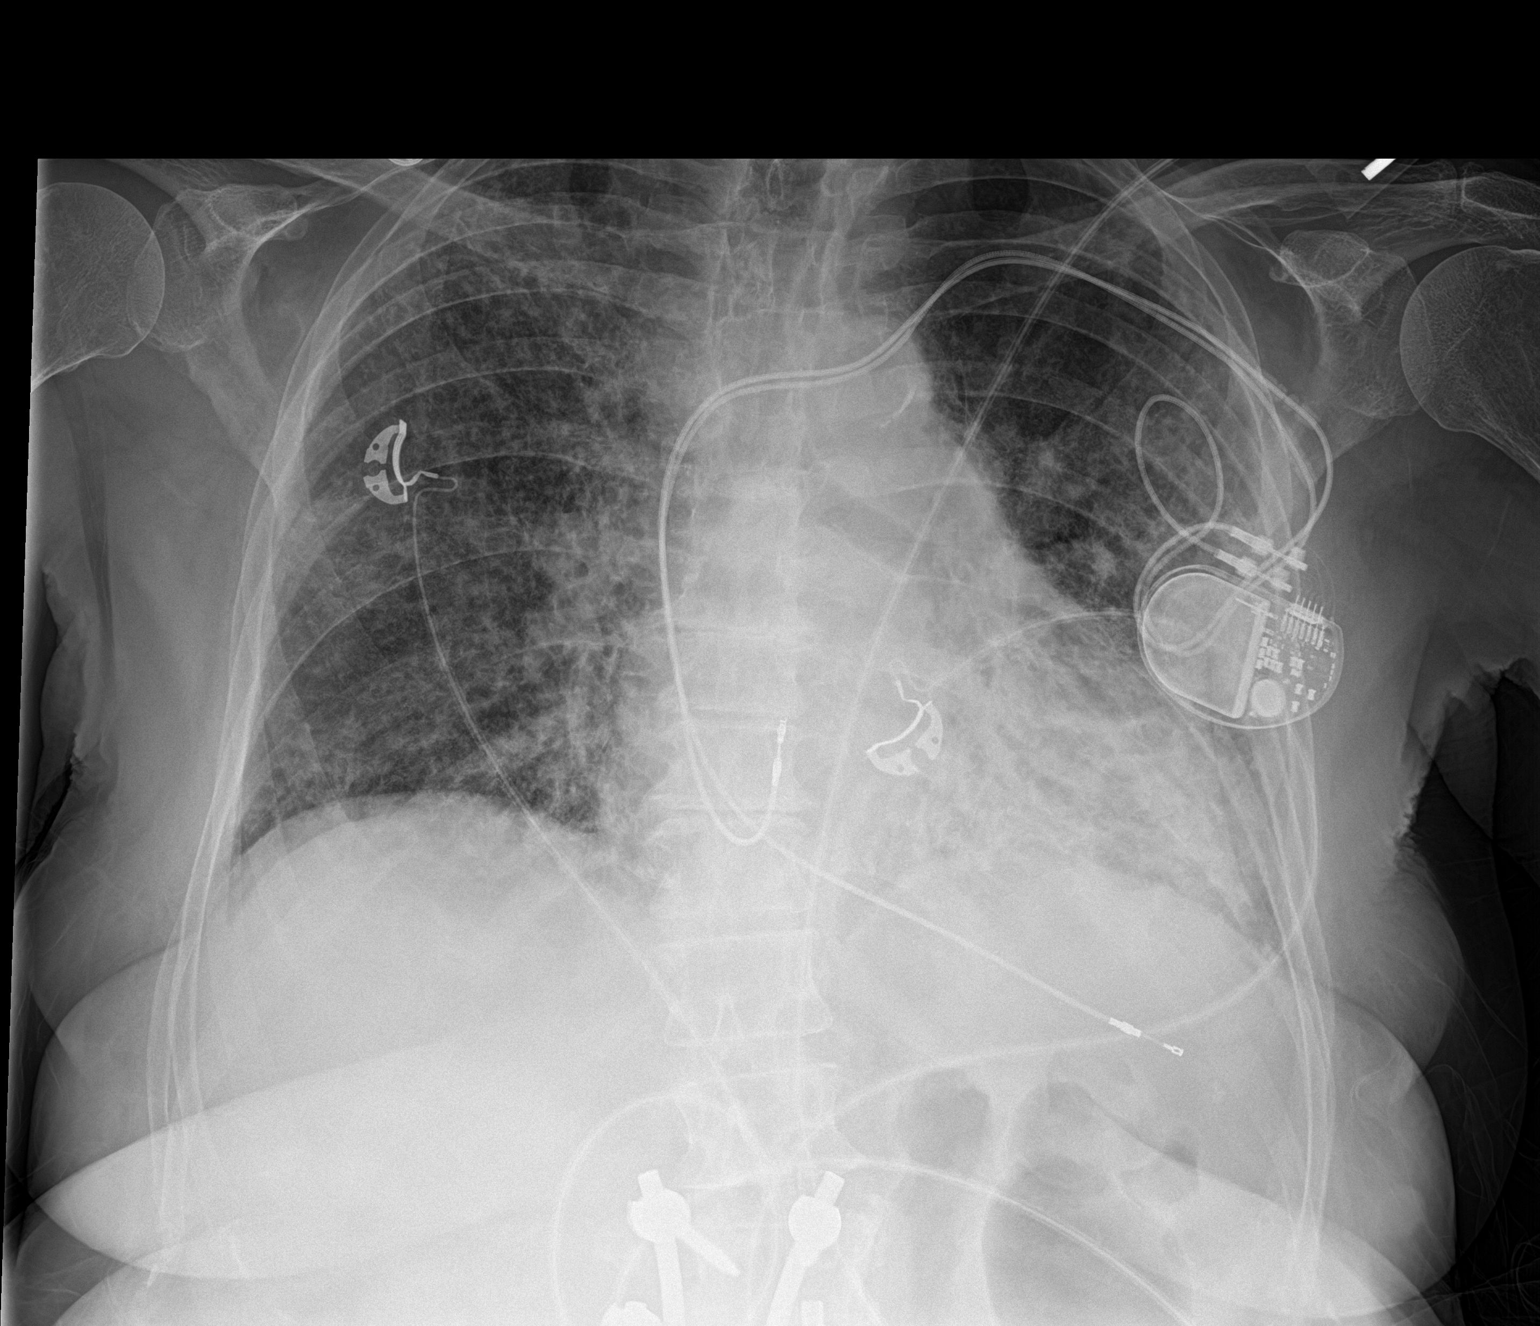

[1 of 1 positions shown; findings below may reference images not displayed]

FINDINGS: Multi lead pacer apparatus overlies the left hemithorax. Stable
cardiac and mediastinal contours. Aortic atherosclerosis. Slight
interval improvement right upper lobe heterogeneous opacities.
Persistent retrocardiac heterogeneous opacities. No pleural effusion
or pneumothorax.
IMPRESSION: Slight interval improvement right upper lobe heterogeneous
opacities.

Persistent left mid and lower lung heterogeneous opacities.

Recommend continued radiographic followup to ensure resolution as
considerations include an acute infectious process and/or underlying
chronic pulmonary changes.

## 2019-11-24 IMAGING — DX DG CHEST 1V PORT
1 series · 1 of 1 positions shown · non-contrast
Comparison: 06/12/2017, 04/06/2015, 03/30/2014

CLINICAL DATA: Respiratory failure

EXAM:
PORTABLE CHEST 1 VIEW

[chest ap]
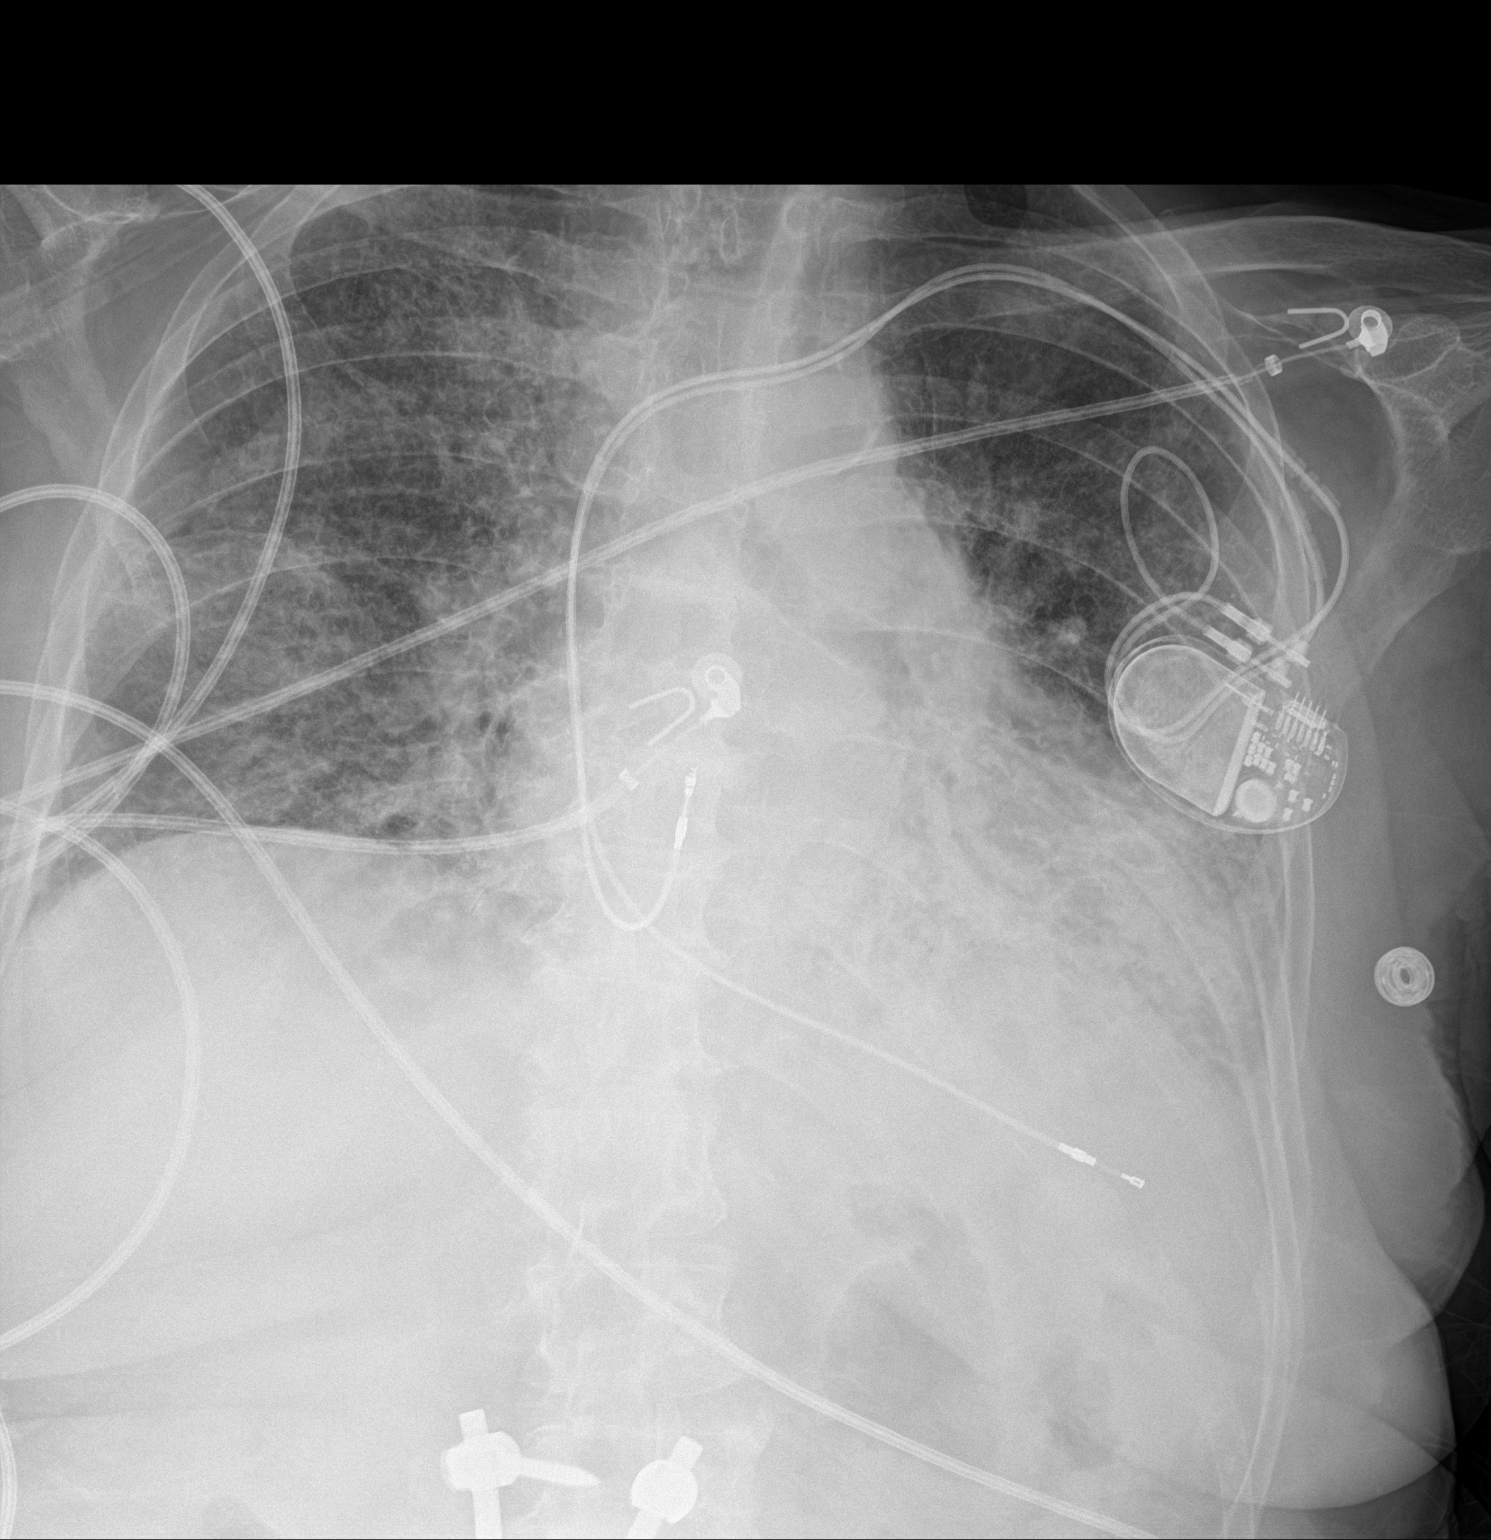

[1 of 1 positions shown; findings below may reference images not displayed]

FINDINGS: Diffuse bilateral interstitial thickening. No pleural effusion or
pneumothorax. Stable cardiomediastinal silhouette. Dual lead cardiac
pacemaker. Thoracic aortic atherosclerosis. No acute osseous
abnormality.
IMPRESSION: 1. Persistent diffuse bilateral interstitial thickening with patchy
alveolar airspace opacities unchanged compared with the prior
examination. Differential considerations include pulmonary edema
versus multilobar pneumonia versus ARDS.

## 2019-11-28 IMAGING — DX DG CHEST 1V PORT
1 series · 1 of 1 positions shown · non-contrast
Comparison: Radiograph June 14, 2017.

CLINICAL DATA: Pneumonia.

EXAM:
PORTABLE CHEST 1 VIEW

[chest ap]
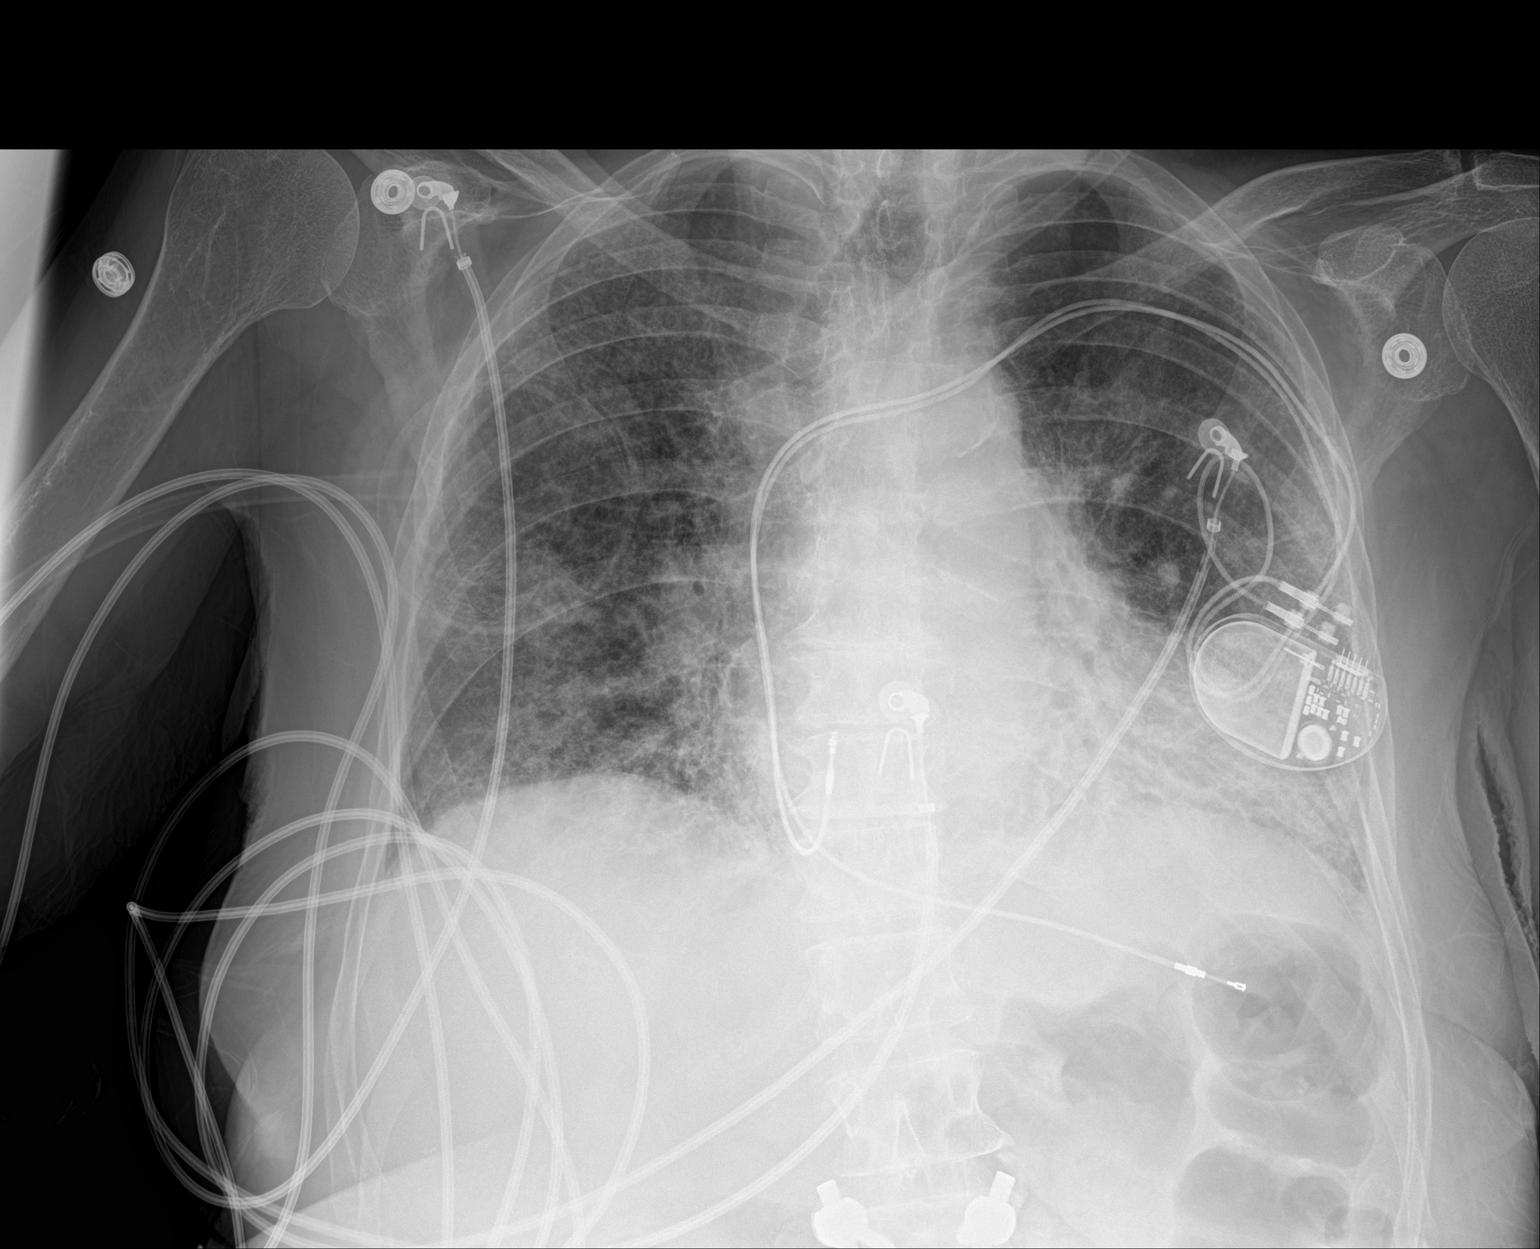

[1 of 1 positions shown; findings below may reference images not displayed]

FINDINGS: Stable cardiomediastinal silhouette. Atherosclerosis of thoracic
aorta is noted. Left-sided pacemaker is unchanged in position.
Stable diffuse interstitial densities are noted throughout both
lungs concerning for edema or inflammation. No pneumothorax or
pleural effusion is noted. Bony thorax is unremarkable.
IMPRESSION: Stable diffuse interstitial densities are noted throughout both
lungs concerning for edema or inflammation.

Aortic Atherosclerosis ([id number]).

## 2019-11-30 IMAGING — DX DG ABD PORTABLE 1V
1 series · 1 of 1 positions shown · non-contrast
Comparison: None.

CLINICAL DATA: NG tube placement.

EXAM:
PORTABLE ABDOMEN - 1 VIEW

[abdomen kub]
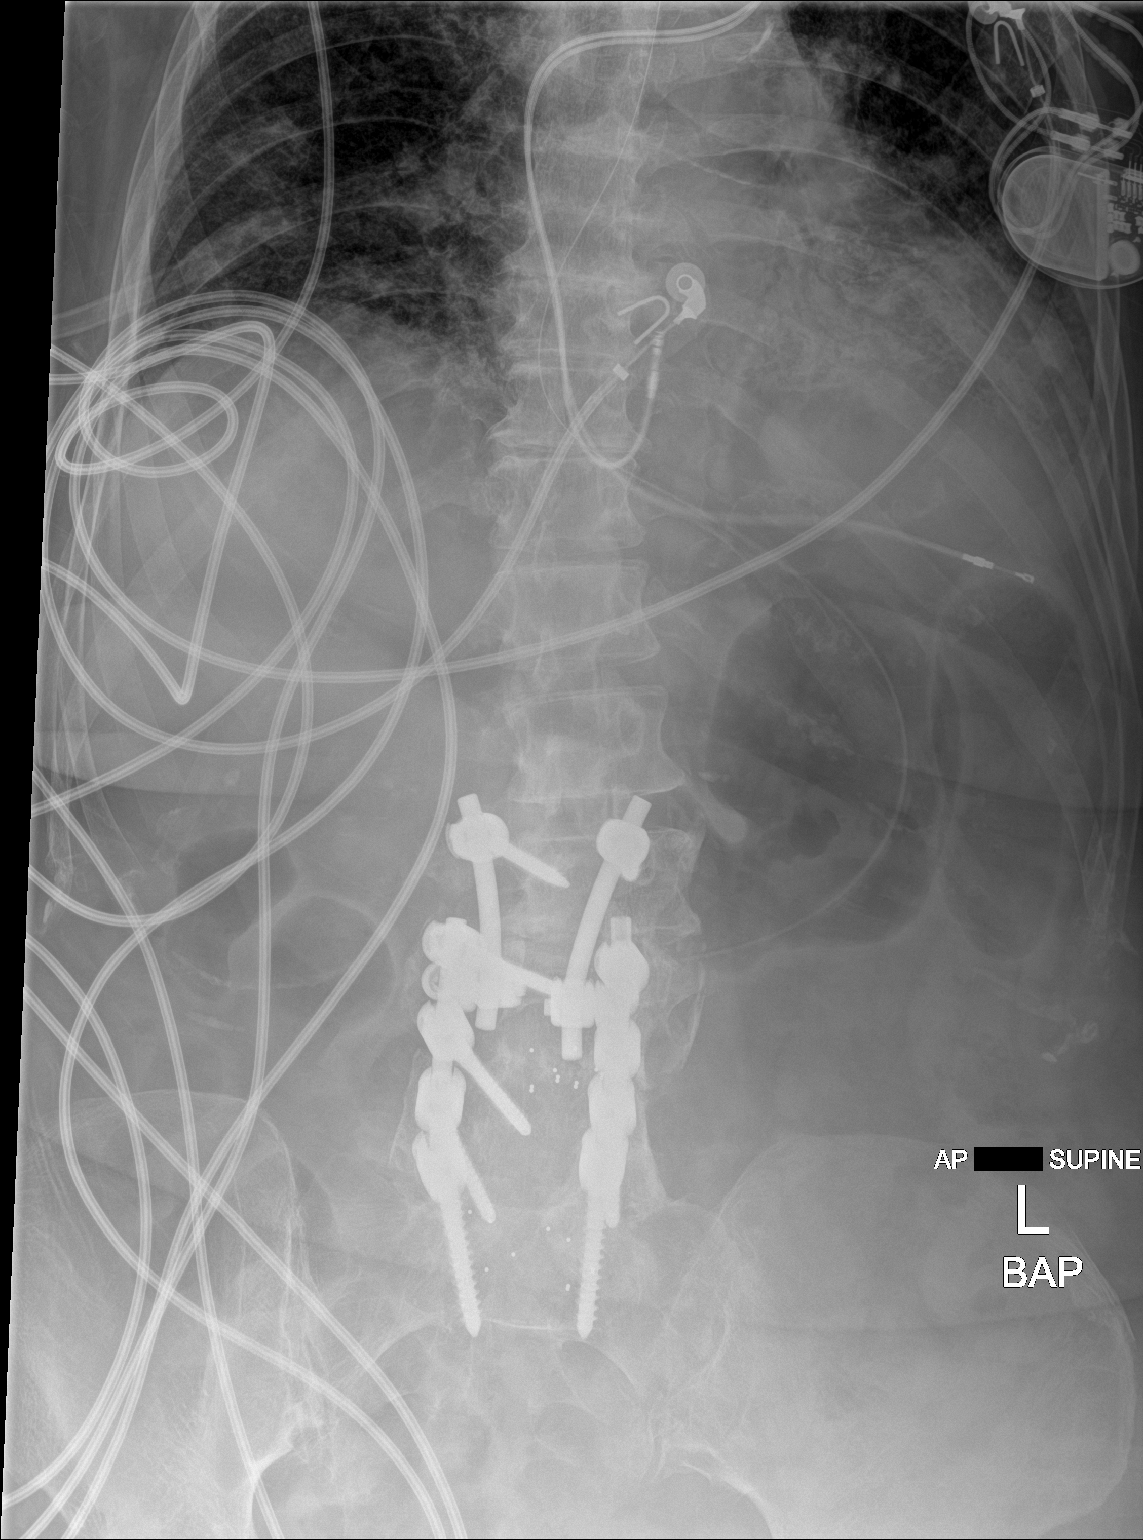

[1 of 1 positions shown; findings below may reference images not displayed]

FINDINGS: An NG tube is identified with tip difficult to definitely identify
due to spinal hardware but appears to overlie mid stomach.
IMPRESSION: NG tube with tip likely in the mid stomach.
